# Patient Record
Sex: Female | Born: 1980 | Race: Black or African American | Hispanic: No | Marital: Married | Smoking: Never smoker
Health system: Southern US, Community
[De-identification: ages and names within clinical notes are randomized; demographics above are authoritative.]

## PROBLEM LIST (undated history)

## (undated) ENCOUNTER — Inpatient Hospital Stay (HOSPITAL_COMMUNITY): Payer: Self-pay

## (undated) DIAGNOSIS — F439 Reaction to severe stress, unspecified: Secondary | ICD-10-CM

## (undated) DIAGNOSIS — M549 Dorsalgia, unspecified: Secondary | ICD-10-CM

## (undated) DIAGNOSIS — R5383 Other fatigue: Secondary | ICD-10-CM

## (undated) DIAGNOSIS — M25569 Pain in unspecified knee: Secondary | ICD-10-CM

## (undated) DIAGNOSIS — M2550 Pain in unspecified joint: Secondary | ICD-10-CM

## (undated) DIAGNOSIS — M109 Gout, unspecified: Secondary | ICD-10-CM

## (undated) DIAGNOSIS — R450 Nervousness: Secondary | ICD-10-CM

## (undated) DIAGNOSIS — E785 Hyperlipidemia, unspecified: Secondary | ICD-10-CM

## (undated) DIAGNOSIS — R12 Heartburn: Secondary | ICD-10-CM

## (undated) DIAGNOSIS — E119 Type 2 diabetes mellitus without complications: Secondary | ICD-10-CM

## (undated) DIAGNOSIS — R198 Other specified symptoms and signs involving the digestive system and abdomen: Secondary | ICD-10-CM

## (undated) DIAGNOSIS — R51 Headache: Secondary | ICD-10-CM

## (undated) DIAGNOSIS — I2510 Atherosclerotic heart disease of native coronary artery without angina pectoris: Secondary | ICD-10-CM

## (undated) DIAGNOSIS — J301 Allergic rhinitis due to pollen: Secondary | ICD-10-CM

## (undated) DIAGNOSIS — Z8619 Personal history of other infectious and parasitic diseases: Secondary | ICD-10-CM

## (undated) DIAGNOSIS — K219 Gastro-esophageal reflux disease without esophagitis: Secondary | ICD-10-CM

## (undated) DIAGNOSIS — R0602 Shortness of breath: Secondary | ICD-10-CM

## (undated) DIAGNOSIS — R002 Palpitations: Secondary | ICD-10-CM

## (undated) DIAGNOSIS — R519 Headache, unspecified: Secondary | ICD-10-CM

## (undated) DIAGNOSIS — F419 Anxiety disorder, unspecified: Secondary | ICD-10-CM

## (undated) DIAGNOSIS — I252 Old myocardial infarction: Secondary | ICD-10-CM

## (undated) DIAGNOSIS — R1310 Dysphagia, unspecified: Secondary | ICD-10-CM

## (undated) DIAGNOSIS — I10 Essential (primary) hypertension: Secondary | ICD-10-CM

## (undated) DIAGNOSIS — R079 Chest pain, unspecified: Secondary | ICD-10-CM

## (undated) HISTORY — DX: Headache, unspecified: R51.9

## (undated) HISTORY — DX: Pain in unspecified joint: M25.50

## (undated) HISTORY — DX: Allergic rhinitis due to pollen: J30.1

## (undated) HISTORY — DX: Shortness of breath: R06.02

## (undated) HISTORY — DX: Reaction to severe stress, unspecified: F43.9

## (undated) HISTORY — DX: Nervousness: R45.0

## (undated) HISTORY — DX: Gastro-esophageal reflux disease without esophagitis: K21.9

## (undated) HISTORY — DX: Other fatigue: R53.83

## (undated) HISTORY — DX: Atherosclerotic heart disease of native coronary artery without angina pectoris: I25.10

## (undated) HISTORY — DX: Type 2 diabetes mellitus without complications: E11.9

## (undated) HISTORY — DX: Hyperlipidemia, unspecified: E78.5

## (undated) HISTORY — DX: Palpitations: R00.2

## (undated) HISTORY — DX: Pain in unspecified knee: M25.569

## (undated) HISTORY — DX: Heartburn: R12

## (undated) HISTORY — DX: Essential (primary) hypertension: I10

## (undated) HISTORY — DX: Anxiety disorder, unspecified: F41.9

## (undated) HISTORY — DX: Headache: R51

## (undated) HISTORY — DX: Dysphagia, unspecified: R13.10

## (undated) HISTORY — DX: Dorsalgia, unspecified: M54.9

## (undated) HISTORY — DX: Gout, unspecified: M10.9

## (undated) HISTORY — DX: Old myocardial infarction: I25.2

## (undated) HISTORY — DX: Chest pain, unspecified: R07.9

## (undated) HISTORY — DX: Other specified symptoms and signs involving the digestive system and abdomen: R19.8

---
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
## 1999-11-18 ENCOUNTER — Encounter: Admission: RE | Admit: 1999-11-18 | Discharge: 2000-02-16 | Payer: Self-pay | Admitting: Internal Medicine

## 2000-03-30 ENCOUNTER — Other Ambulatory Visit: Admission: RE | Admit: 2000-03-30 | Discharge: 2000-03-30 | Payer: Self-pay | Admitting: Gynecology

## 2000-12-03 ENCOUNTER — Emergency Department (HOSPITAL_COMMUNITY): Admission: EM | Admit: 2000-12-03 | Discharge: 2000-12-03 | Payer: Self-pay | Admitting: Emergency Medicine

## 2001-04-03 ENCOUNTER — Other Ambulatory Visit: Admission: RE | Admit: 2001-04-03 | Discharge: 2001-04-03 | Payer: Self-pay | Admitting: Gynecology

## 2002-04-05 ENCOUNTER — Other Ambulatory Visit: Admission: RE | Admit: 2002-04-05 | Discharge: 2002-04-05 | Payer: Self-pay | Admitting: Gynecology

## 2003-09-18 ENCOUNTER — Encounter: Admission: RE | Admit: 2003-09-18 | Discharge: 2003-12-17 | Payer: Self-pay | Admitting: Endocrinology

## 2004-03-05 ENCOUNTER — Other Ambulatory Visit: Admission: RE | Admit: 2004-03-05 | Discharge: 2004-03-05 | Payer: Self-pay | Admitting: Gynecology

## 2004-06-22 ENCOUNTER — Ambulatory Visit (HOSPITAL_COMMUNITY): Admission: RE | Admit: 2004-06-22 | Discharge: 2004-06-22 | Payer: Self-pay | Admitting: Surgery

## 2004-06-29 ENCOUNTER — Ambulatory Visit (HOSPITAL_COMMUNITY): Admission: RE | Admit: 2004-06-29 | Discharge: 2004-06-29 | Payer: Self-pay | Admitting: Surgery

## 2004-08-03 ENCOUNTER — Encounter: Admission: RE | Admit: 2004-08-03 | Discharge: 2004-11-01 | Payer: Self-pay | Admitting: Endocrinology

## 2004-11-13 ENCOUNTER — Encounter: Admission: RE | Admit: 2004-11-13 | Discharge: 2005-02-11 | Payer: Self-pay | Admitting: Surgery

## 2005-12-13 ENCOUNTER — Other Ambulatory Visit: Admission: RE | Admit: 2005-12-13 | Discharge: 2005-12-13 | Payer: Self-pay | Admitting: Gynecology

## 2007-03-15 ENCOUNTER — Other Ambulatory Visit: Admission: RE | Admit: 2007-03-15 | Discharge: 2007-03-15 | Payer: Self-pay | Admitting: Gynecology

## 2007-07-31 ENCOUNTER — Emergency Department (HOSPITAL_COMMUNITY): Admission: EM | Admit: 2007-07-31 | Discharge: 2007-07-31 | Payer: Self-pay | Admitting: Emergency Medicine

## 2008-03-11 ENCOUNTER — Encounter: Admission: RE | Admit: 2008-03-11 | Discharge: 2008-03-11 | Payer: Self-pay | Admitting: Surgery

## 2008-03-12 ENCOUNTER — Ambulatory Visit (HOSPITAL_COMMUNITY): Admission: RE | Admit: 2008-03-12 | Discharge: 2008-03-12 | Payer: Self-pay | Admitting: Surgery

## 2008-08-20 ENCOUNTER — Encounter: Admission: RE | Admit: 2008-08-20 | Discharge: 2008-11-18 | Payer: Self-pay | Admitting: Surgery

## 2008-09-02 ENCOUNTER — Ambulatory Visit (HOSPITAL_COMMUNITY): Admission: RE | Admit: 2008-09-02 | Discharge: 2008-09-03 | Payer: Self-pay | Admitting: Surgery

## 2008-09-02 HISTORY — PX: LAPAROSCOPIC GASTRIC BANDING: SHX1100

## 2008-12-23 ENCOUNTER — Encounter: Admission: RE | Admit: 2008-12-23 | Discharge: 2008-12-23 | Payer: Self-pay | Admitting: Surgery

## 2009-03-25 ENCOUNTER — Encounter: Admission: RE | Admit: 2009-03-25 | Discharge: 2009-03-25 | Payer: Self-pay | Admitting: Surgery

## 2009-09-02 ENCOUNTER — Encounter: Admission: RE | Admit: 2009-09-02 | Discharge: 2009-09-17 | Payer: Self-pay | Admitting: Surgery

## 2011-02-02 NOTE — Op Note (Signed)
[name], [name]             ACCOUNT NO.:  [id number]   MEDICAL RECORD NO.:  [id number]          PATIENT TYPE:  OIB   LOCATION:  1534                         FACILITY:  [facility]   PHYSICIAN:  Sandria Bales. Ezzard Standing, M.D.  DATE OF BIRTH:  [birth year]   DATE OF PROCEDURE:  09/02/2008  DATE OF DISCHARGE:                               OPERATIVE REPORT   Date of Surgery ?   PREOPERATIVE DIAGNOSIS:  Morbid obesity weight 313, BMI 53.5.   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   PROCEDURE:  Enterolysis of adhesions (one hour spent), lap band AP large  with subcutaneous port with mesh backing   SURGEON:  Sandria Bales. Ezzard Standing, M.D.   FIRST ASSISTANT:  Thornton Park. Daphine Deutscher, MD   ANESTHESIA:  General endotracheal.   ESTIMATED BLOOD LOSS:  Minimal.   PROCEDURE:  This patient is a 30 year old black female who sees Dr. Hinda Lenis as her primary doctor, Dr. Dorisann Frames from endocrine  standpoint and she has had been interested in a lap band.  She has been  through our preop bariatric program.  I have discussed with her the  indications, potential complications of band surgery.  Potential  complications include, but are not limited to bleeding, infection, bowel  injury, slippage erosion of the band and long-term nutritional  consequences.   One point is that she had gastric surgery as an infant about 1 year of  age for a foreign body she swallowed, it is actually a little bit  unclear of how that will affect.  She understands that adds an increased  risk of the operation.   OPERATIVE NOTE:  The patient placed in a supine position, given a  general endotracheal anesthetic.  Her abdomen was prepped with  Technicare generic then sterilely draped.  A time-out was held  identifying the patient and the procedure.  She was given Ancef prior to  the procedure had PAS stockings in place.   I entered the abdomen  through a left upper quadrant 11-mm OptiVu trocar.  She had adhesions underneath her midline scar.  I placed a 5 mm in the  left lower quadrant and helped take these adhesions down mainly with  sharp dissection, but I did use a Harmonic Bovie a little bit.   I then got up to her liver and now put my more traditional trocars in  place.  I placed a 5 mm left lateral trocar, a 11 mm left paramedian  trocar and 11 mm right paramedian trocar, right subcostal 15 mm trocar  and placed the Nathanson retractor through a 5 mm subxiphoid trocar.   I spent a total of about 1 hour taking down adhesions, beside the  adhesions to the anterior wall of the omentum, up under the left lobe of  the liver, her stomach was fairly intimate and I teased through this.  I  tried to avoid using any kind of energy and used primarily either blunt  dissection or scissors.  I got up to the esophageal hiatus.  I then identified the left crus and the right crus of the diaphragm and  thought I had a reasonable anatomy demonstrated.   Anesthesia passed the sizer down into the stomach and blew the balloon  up.  The balloon did seem to go through the hiatus, so she has a  generous hiatus but I was hesitant to dissect out the hiatus; first her  upper GI showed no evidence of hiatal hernia.  Secondly, she has  occasional reflux symptoms not was chronically on medicines.   Therefore, I went on with the band procedure.  Because of her size and  the proximal gastric fat, I decided to put an AP large band.  I  identified the angle of HIS along the left crus.  I then went along the  right crus, used a finger dissector to pass this behind the proximal  stomach and I passed the band around the proximal stomach.   With the balloon reinserted into the stomach, I then cinched the band  down over this balloon, removed the balloon and I used three 2-0  Ethibond sutures to buttress or imbricate the stomach over the left  lateral  aspect of the band.   The band is seen to lay in the proper position.  There is no bleeding  from the suture sites and the band did rotate.   I then irrigated the abdomen with about a liter and a half of saline.  I  reinspected this liver which had bled some but the resection was dry by  the end of the procedure.  I aspirated out this fluid.  I then brought  out the tubing through the right paramedian incision.  I removed all of  the trocars under direct visualization, as well as the Management consultant.   Because of her size, I  did a subcuticular pocket.  I attached the  silastic tubing to the  reservoir device.  I placed a reservoir backing  of the Bard polypropylene suture and placed this back with four Prolene  sutures.  I created a pocket lateral to a paramedian incision placed  this in the pocket, sewed this over with 3-0 Vicryl suture.  I closed  the skin with a 5-0 Vicryl suture at each site.  I painted the wound  with tincture of Benzoin steri-stripped and sterilely dressed.   The patient tolerated the procedure well, was transported to the  recovery room good condition.  Sponge and needle count were correct at  the end of the case.      Sandria Bales. Ezzard Standing, M.D.  Electronically Signed     DHN/MEDQ  D:  09/02/2008  T:  09/03/2008  Job:  [id number]   cc:   Dorisann Frames, M.D.  Fax: [phone]   Elana Alm. Nicholos Johns, M.D.  Fax: [phone]   Gretta Cool, M.D.  Fax: [phone]

## 2011-06-24 LAB — GLUCOSE, CAPILLARY
Glucose-Capillary: 110 mg/dL — ABNORMAL HIGH (ref 70–99)
Glucose-Capillary: 128 mg/dL — ABNORMAL HIGH (ref 70–99)
Glucose-Capillary: 175 mg/dL — ABNORMAL HIGH (ref 70–99)
Glucose-Capillary: 193 mg/dL — ABNORMAL HIGH (ref 70–99)
Glucose-Capillary: 202 mg/dL — ABNORMAL HIGH (ref 70–99)
Glucose-Capillary: 222 mg/dL — ABNORMAL HIGH (ref 70–99)
Glucose-Capillary: 266 mg/dL — ABNORMAL HIGH (ref 70–99)

## 2011-06-24 LAB — CBC
HCT: 42.7 % (ref 36.0–46.0)
HCT: 50.6 % — ABNORMAL HIGH (ref 36.0–46.0)
Hemoglobin: 14.2 g/dL (ref 12.0–15.0)
Hemoglobin: 16.6 g/dL — ABNORMAL HIGH (ref 12.0–15.0)
MCHC: 32.8 g/dL (ref 30.0–36.0)
MCHC: 33.3 g/dL (ref 30.0–36.0)
MCV: 78.2 fL (ref 78.0–100.0)
MCV: 78.8 fL (ref 78.0–100.0)
Platelets: 226 10*3/uL (ref 150–400)
Platelets: ADEQUATE 10*3/uL (ref 150–400)
RBC: 5.43 MIL/uL — ABNORMAL HIGH (ref 3.87–5.11)
RBC: 6.48 MIL/uL — ABNORMAL HIGH (ref 3.87–5.11)
RDW: 14.6 % (ref 11.5–15.5)
RDW: 15 % (ref 11.5–15.5)
WBC: 7.1 10*3/uL (ref 4.0–10.5)
WBC: 8.8 10*3/uL (ref 4.0–10.5)

## 2011-06-24 LAB — DIFFERENTIAL
Basophils Absolute: 0 10*3/uL (ref 0.0–0.1)
Basophils Absolute: 0.1 10*3/uL (ref 0.0–0.1)
Basophils Relative: 0 % (ref 0–1)
Basophils Relative: 1 % (ref 0–1)
Eosinophils Absolute: 0 10*3/uL (ref 0.0–0.7)
Eosinophils Absolute: 0.1 10*3/uL (ref 0.0–0.7)
Eosinophils Relative: 0 % (ref 0–5)
Eosinophils Relative: 2 % (ref 0–5)
Lymphocytes Relative: 10 % — ABNORMAL LOW (ref 12–46)
Lymphocytes Relative: 32 % (ref 12–46)
Lymphs Abs: 0.9 10*3/uL (ref 0.7–4.0)
Lymphs Abs: 2.3 10*3/uL (ref 0.7–4.0)
Monocytes Absolute: 0.3 10*3/uL (ref 0.1–1.0)
Monocytes Absolute: 0.4 10*3/uL (ref 0.1–1.0)
Monocytes Relative: 4 % (ref 3–12)
Monocytes Relative: 5 % (ref 3–12)
Neutro Abs: 4.3 10*3/uL (ref 1.7–7.7)
Neutro Abs: 7.5 10*3/uL (ref 1.7–7.7)
Neutrophils Relative %: 61 % (ref 43–77)
Neutrophils Relative %: 85 % — ABNORMAL HIGH (ref 43–77)

## 2011-06-24 LAB — PREGNANCY, URINE: Preg Test, Ur: NEGATIVE

## 2011-06-29 LAB — URIC ACID: Uric Acid, Serum: 7.8 — ABNORMAL HIGH

## 2012-04-05 ENCOUNTER — Encounter (INDEPENDENT_AMBULATORY_CARE_PROVIDER_SITE_OTHER): Payer: Self-pay | Admitting: General Surgery

## 2012-04-06 ENCOUNTER — Ambulatory Visit (INDEPENDENT_AMBULATORY_CARE_PROVIDER_SITE_OTHER): Payer: BC Managed Care – PPO | Admitting: Physician Assistant

## 2012-04-06 ENCOUNTER — Encounter (INDEPENDENT_AMBULATORY_CARE_PROVIDER_SITE_OTHER): Payer: Self-pay

## 2012-04-06 VITALS — BP 112/84 | Ht 64.0 in | Wt 227.2 lb

## 2012-04-06 DIAGNOSIS — Z4651 Encounter for fitting and adjustment of gastric lap band: Secondary | ICD-10-CM

## 2012-04-06 NOTE — Progress Notes (Signed)
  HISTORY: [name] is a 31 y.o.female who received an AP-Large lap-band in December 2009 by Dr. Ezzard Standing. She comes in having last been seen in October 2010 and has lost an additional 11 lbs. She is noticing an increase in her hunger and portion sizes and would like an adjustment today. She denies any regurgitation or reflux.  VITAL SIGNS: Filed Vitals:   04/06/12 1122  BP: 112/84    PHYSICAL EXAM: Physical exam reveals a very well-appearing 31 y.o.female in no apparent distress Neurologic: Awake, alert, oriented Psych: Bright affect, conversant Respiratory: Breathing even and unlabored. No stridor or wheezing Abdomen: Soft, nontender, nondistended to palpation. Incisions well-healed. No incisional hernias. Port easily palpated. Extremities: Atraumatic, good range of motion.  ASSESMENT: 31 y.o.  female  s/p AP-Large lap-band.   PLAN: The patient's port was accessed with a 20G Huber needle without difficulty. Clear fluid was aspirated and 0.5 mL saline was added to the port to give a total predicted volume of 9 mL. The patient was able to swallow water without difficulty following the procedure and was instructed to take clear liquids for the next 24-48 hours and advance slowly as tolerated.

## 2012-04-06 NOTE — Patient Instructions (Signed)
Take clear liquids tonight. Thin protein shakes are ok to start tomorrow morning. Slowly advance your diet thereafter. Call us if you have persistent vomiting or regurgitation, night cough or reflux symptoms. Return as scheduled or sooner if you notice no changes in hunger/portion sizes.  

## 2016-06-23 ENCOUNTER — Encounter (HOSPITAL_COMMUNITY): Payer: Self-pay

## 2017-04-07 ENCOUNTER — Encounter (HOSPITAL_COMMUNITY): Payer: Self-pay

## 2017-07-24 ENCOUNTER — Encounter (HOSPITAL_COMMUNITY)
Admission: EM | Disposition: A | Discharge status: Home / Self Care | Payer: Self-pay | Source: Home / Self Care | Attending: Interventional Cardiology

## 2017-07-24 ENCOUNTER — Emergency Department (HOSPITAL_COMMUNITY): Payer: BC Managed Care – PPO

## 2017-07-24 ENCOUNTER — Encounter (HOSPITAL_COMMUNITY): Payer: Self-pay | Admitting: Emergency Medicine

## 2017-07-24 ENCOUNTER — Inpatient Hospital Stay (HOSPITAL_COMMUNITY)
Admission: EM | Admit: 2017-07-24 | Discharge: 2017-08-09 | DRG: 231 | Disposition: A | Discharge status: Home / Self Care | Payer: BC Managed Care – PPO | Attending: Cardiothoracic Surgery | Admitting: Cardiothoracic Surgery

## 2017-07-24 ENCOUNTER — Other Ambulatory Visit: Payer: Self-pay

## 2017-07-24 DIAGNOSIS — I200 Unstable angina: Secondary | ICD-10-CM | POA: Diagnosis not present

## 2017-07-24 DIAGNOSIS — I25110 Atherosclerotic heart disease of native coronary artery with unstable angina pectoris: Secondary | ICD-10-CM | POA: Diagnosis not present

## 2017-07-24 DIAGNOSIS — I2111 ST elevation (STEMI) myocardial infarction involving right coronary artery: Secondary | ICD-10-CM | POA: Diagnosis not present

## 2017-07-24 DIAGNOSIS — E785 Hyperlipidemia, unspecified: Secondary | ICD-10-CM | POA: Diagnosis present

## 2017-07-24 DIAGNOSIS — I2510 Atherosclerotic heart disease of native coronary artery without angina pectoris: Secondary | ICD-10-CM | POA: Diagnosis present

## 2017-07-24 DIAGNOSIS — E1169 Type 2 diabetes mellitus with other specified complication: Secondary | ICD-10-CM | POA: Diagnosis present

## 2017-07-24 DIAGNOSIS — I252 Old myocardial infarction: Secondary | ICD-10-CM | POA: Diagnosis present

## 2017-07-24 DIAGNOSIS — Z79899 Other long term (current) drug therapy: Secondary | ICD-10-CM

## 2017-07-24 DIAGNOSIS — J9811 Atelectasis: Secondary | ICD-10-CM | POA: Diagnosis not present

## 2017-07-24 DIAGNOSIS — I5031 Acute diastolic (congestive) heart failure: Secondary | ICD-10-CM | POA: Diagnosis present

## 2017-07-24 DIAGNOSIS — I25709 Atherosclerosis of coronary artery bypass graft(s), unspecified, with unspecified angina pectoris: Secondary | ICD-10-CM | POA: Diagnosis present

## 2017-07-24 DIAGNOSIS — E119 Type 2 diabetes mellitus without complications: Secondary | ICD-10-CM | POA: Diagnosis present

## 2017-07-24 DIAGNOSIS — I509 Heart failure, unspecified: Secondary | ICD-10-CM

## 2017-07-24 DIAGNOSIS — Z833 Family history of diabetes mellitus: Secondary | ICD-10-CM | POA: Diagnosis not present

## 2017-07-24 DIAGNOSIS — J90 Pleural effusion, not elsewhere classified: Secondary | ICD-10-CM

## 2017-07-24 DIAGNOSIS — I2119 ST elevation (STEMI) myocardial infarction involving other coronary artery of inferior wall: Principal | ICD-10-CM | POA: Diagnosis present

## 2017-07-24 DIAGNOSIS — Z8249 Family history of ischemic heart disease and other diseases of the circulatory system: Secondary | ICD-10-CM | POA: Diagnosis not present

## 2017-07-24 DIAGNOSIS — Z8042 Family history of malignant neoplasm of prostate: Secondary | ICD-10-CM

## 2017-07-24 DIAGNOSIS — E669 Obesity, unspecified: Secondary | ICD-10-CM | POA: Diagnosis not present

## 2017-07-24 DIAGNOSIS — Z01810 Encounter for preprocedural cardiovascular examination: Secondary | ICD-10-CM | POA: Diagnosis not present

## 2017-07-24 DIAGNOSIS — D62 Acute posthemorrhagic anemia: Secondary | ICD-10-CM | POA: Diagnosis not present

## 2017-07-24 DIAGNOSIS — I8393 Asymptomatic varicose veins of bilateral lower extremities: Secondary | ICD-10-CM | POA: Diagnosis present

## 2017-07-24 DIAGNOSIS — Z6841 Body Mass Index (BMI) 40.0 and over, adult: Secondary | ICD-10-CM

## 2017-07-24 DIAGNOSIS — Z801 Family history of malignant neoplasm of trachea, bronchus and lung: Secondary | ICD-10-CM

## 2017-07-24 DIAGNOSIS — Z955 Presence of coronary angioplasty implant and graft: Secondary | ICD-10-CM

## 2017-07-24 DIAGNOSIS — I2129 ST elevation (STEMI) myocardial infarction involving other sites: Secondary | ICD-10-CM | POA: Diagnosis present

## 2017-07-24 DIAGNOSIS — Z951 Presence of aortocoronary bypass graft: Secondary | ICD-10-CM

## 2017-07-24 DIAGNOSIS — Z9689 Presence of other specified functional implants: Secondary | ICD-10-CM

## 2017-07-24 DIAGNOSIS — J939 Pneumothorax, unspecified: Secondary | ICD-10-CM

## 2017-07-24 DIAGNOSIS — Z9884 Bariatric surgery status: Secondary | ICD-10-CM

## 2017-07-24 DIAGNOSIS — M109 Gout, unspecified: Secondary | ICD-10-CM | POA: Diagnosis present

## 2017-07-24 DIAGNOSIS — I110 Hypertensive heart disease with heart failure: Secondary | ICD-10-CM | POA: Diagnosis present

## 2017-07-24 HISTORY — PX: CORONARY/GRAFT ACUTE MI REVASCULARIZATION: CATH118305

## 2017-07-24 HISTORY — PX: LEFT HEART CATH AND CORONARY ANGIOGRAPHY: CATH118249

## 2017-07-24 LAB — I-STAT CHEM 8, ED
BUN: 13 mg/dL (ref 6–20)
Calcium, Ion: 1.11 mmol/L — ABNORMAL LOW (ref 1.15–1.40)
Chloride: 103 mmol/L (ref 101–111)
Creatinine, Ser: 0.8 mg/dL (ref 0.44–1.00)
Glucose, Bld: 250 mg/dL — ABNORMAL HIGH (ref 65–99)
HCT: 41 % (ref 36.0–46.0)
Hemoglobin: 13.9 g/dL (ref 12.0–15.0)
Potassium: 4.8 mmol/L (ref 3.5–5.1)
Sodium: 139 mmol/L (ref 135–145)
TCO2: 24 mmol/L (ref 22–32)

## 2017-07-24 LAB — CREATININE, SERUM
Creatinine, Ser: 0.85 mg/dL (ref 0.44–1.00)
GFR calc Af Amer: >60 mL/min (ref >60)
GFR calc non Af Amer: >60 mL/min (ref >60)

## 2017-07-24 LAB — COMPREHENSIVE METABOLIC PANEL
ALT: 18 U/L (ref 14–54)
AST: 26 U/L (ref 15–41)
Albumin: 3.2 g/dL — ABNORMAL LOW (ref 3.5–5.0)
Alkaline Phosphatase: 65 U/L (ref 38–126)
Anion gap: 9 (ref 5–15)
BUN: 13 mg/dL (ref 6–20)
CO2: 23 mmol/L (ref 22–32)
Calcium: 8.5 mg/dL — ABNORMAL LOW (ref 8.9–10.3)
Chloride: 103 mmol/L (ref 101–111)
Creatinine, Ser: 0.86 mg/dL (ref 0.44–1.00)
GFR calc Af Amer: >60 mL/min (ref >60)
GFR calc non Af Amer: >60 mL/min (ref >60)
Glucose, Bld: 256 mg/dL — ABNORMAL HIGH (ref 65–99)
Potassium: 4.8 mmol/L (ref 3.5–5.1)
Sodium: 135 mmol/L (ref 135–145)
Total Bilirubin: 0.6 mg/dL (ref 0.3–1.2)
Total Protein: 6.7 g/dL (ref 6.5–8.1)

## 2017-07-24 LAB — CBC WITH DIFFERENTIAL/PLATELET
Basophils Absolute: 0 10*3/uL (ref 0.0–0.1)
Basophils Relative: 0 %
Eosinophils Absolute: 0.1 10*3/uL (ref 0.0–0.7)
Eosinophils Relative: 1 %
HCT: 39.4 % (ref 36.0–46.0)
Hemoglobin: 13.5 g/dL (ref 12.0–15.0)
Lymphocytes Relative: 19 %
Lymphs Abs: 1.8 10*3/uL (ref 0.7–4.0)
MCH: 28 pg (ref 26.0–34.0)
MCHC: 34.3 g/dL (ref 30.0–36.0)
MCV: 81.6 fL (ref 78.0–100.0)
Monocytes Absolute: 0.7 10*3/uL (ref 0.1–1.0)
Monocytes Relative: 7 %
Neutro Abs: 7.1 10*3/uL (ref 1.7–7.7)
Neutrophils Relative %: 73 %
Platelets: 209 10*3/uL (ref 150–400)
RBC: 4.83 MIL/uL (ref 3.87–5.11)
RDW: 13.5 % (ref 11.5–15.5)
WBC: 9.8 10*3/uL (ref 4.0–10.5)

## 2017-07-24 LAB — HEMOGLOBIN A1C
Hgb A1c MFr Bld: 7.4 % — ABNORMAL HIGH (ref 4.8–5.6)
Mean Plasma Glucose: 165.68 mg/dL

## 2017-07-24 LAB — I-STAT TROPONIN, ED: Troponin i, poc: 0.03 ng/mL (ref 0.00–0.08)

## 2017-07-24 LAB — TROPONIN I
Troponin I: >65 ng/mL (ref <0.03)
Troponin I: 61.51 ng/mL (ref <0.03)

## 2017-07-24 LAB — CBC
HCT: 41.9 % (ref 36.0–46.0)
Hemoglobin: 13.8 g/dL (ref 12.0–15.0)
MCH: 27 pg (ref 26.0–34.0)
MCHC: 32.9 g/dL (ref 30.0–36.0)
MCV: 81.8 fL (ref 78.0–100.0)
Platelets: 217 10*3/uL (ref 150–400)
RBC: 5.12 MIL/uL — ABNORMAL HIGH (ref 3.87–5.11)
RDW: 13.4 % (ref 11.5–15.5)
WBC: 9.8 10*3/uL (ref 4.0–10.5)

## 2017-07-24 LAB — GLUCOSE, CAPILLARY
Glucose-Capillary: 210 mg/dL — ABNORMAL HIGH (ref 65–99)
Glucose-Capillary: 151 mg/dL — ABNORMAL HIGH (ref 65–99)
Glucose-Capillary: 158 mg/dL — ABNORMAL HIGH (ref 65–99)

## 2017-07-24 LAB — I-STAT BETA HCG BLOOD, ED (MC, WL, AP ONLY): I-stat hCG, quantitative: <5 m[IU]/mL (ref <5)

## 2017-07-24 LAB — HEPARIN LEVEL (UNFRACTIONATED): Heparin Unfractionated: <0.1 IU/mL — ABNORMAL LOW (ref 0.30–0.70)

## 2017-07-24 LAB — APTT: aPTT: 24 seconds (ref 24–36)

## 2017-07-24 LAB — POCT ACTIVATED CLOTTING TIME
Activated Clotting Time: 175 seconds
Activated Clotting Time: 180 seconds
Activated Clotting Time: 202 seconds
Activated Clotting Time: 208 seconds
Activated Clotting Time: 252 seconds

## 2017-07-24 LAB — PROTIME-INR
INR: 0.95
Prothrombin Time: 12.6 seconds (ref 11.4–15.2)

## 2017-07-24 SURGERY — CORONARY/GRAFT ACUTE MI REVASCULARIZATION
Anesthesia: LOCAL

## 2017-07-24 MED ORDER — NITROGLYCERIN 1 MG/10 ML FOR IR/CATH LAB
INTRA_ARTERIAL | Status: DC | PRN
  Administered 2017-07-24: 200 ug via INTRACORONARY

## 2017-07-24 MED ORDER — HEPARIN (PORCINE) IN NACL 2-0.9 UNIT/ML-% IJ SOLN
INTRAMUSCULAR | Status: AC
  Filled 2017-07-24: qty 1000

## 2017-07-24 MED ORDER — VERAPAMIL HCL 2.5 MG/ML IV SOLN
INTRAVENOUS | Status: DC | PRN
  Administered 2017-07-24: 10 mL via INTRA_ARTERIAL

## 2017-07-24 MED ORDER — TIROFIBAN HCL IN NACL 5-0.9 MG/100ML-% IV SOLN
INTRAVENOUS | Status: AC | PRN
  Administered 2017-07-24: 0.15 ug/kg/min via INTRAVENOUS

## 2017-07-24 MED ORDER — TICAGRELOR 90 MG PO TABS
ORAL_TABLET | ORAL | Status: AC
  Filled 2017-07-24: qty 2

## 2017-07-24 MED ORDER — SODIUM CHLORIDE 0.9% FLUSH
3.0000 mL | Freq: Two times a day (BID) | INTRAVENOUS | Status: DC
  Administered 2017-07-24: 3 mL via INTRAVENOUS
  Administered 2017-07-25: 10 mL via INTRAVENOUS
  Administered 2017-07-25 – 2017-08-01 (×11): 3 mL via INTRAVENOUS

## 2017-07-24 MED ORDER — ALPRAZOLAM 0.5 MG PO TABS
1.0000 mg | ORAL_TABLET | Freq: Two times a day (BID) | ORAL | Status: DC | PRN
  Administered 2017-07-24 – 2017-07-29 (×2): 1 mg via ORAL
  Filled 2017-07-24 (×3): qty 4

## 2017-07-24 MED ORDER — ONDANSETRON HCL 4 MG/2ML IJ SOLN
4.0000 mg | Freq: Four times a day (QID) | INTRAMUSCULAR | Status: DC | PRN
  Administered 2017-07-24: 4 mg via INTRAVENOUS
  Filled 2017-07-24: qty 2

## 2017-07-24 MED ORDER — SODIUM CHLORIDE 0.9 % IV BOLUS (SEPSIS)
1000.0000 mL | Freq: Once | INTRAVENOUS | Status: AC
  Administered 2017-07-24: 1000 mL via INTRAVENOUS

## 2017-07-24 MED ORDER — SODIUM CHLORIDE 0.9 % IV SOLN: 250.0000 mL | INTRAVENOUS | Status: DC | PRN

## 2017-07-24 MED ORDER — HEPARIN BOLUS VIA INFUSION
4000.0000 [IU] | Freq: Once | INTRAVENOUS | Status: AC
  Administered 2017-07-24: 4000 [IU] via INTRAVENOUS
  Filled 2017-07-24: qty 4000

## 2017-07-24 MED ORDER — TIROFIBAN HCL IN NACL 5-0.9 MG/100ML-% IV SOLN
INTRAVENOUS | Status: AC
  Filled 2017-07-24: qty 100

## 2017-07-24 MED ORDER — HYDRALAZINE HCL 20 MG/ML IJ SOLN: 5.0000 mg | INTRAMUSCULAR | Status: AC | PRN

## 2017-07-24 MED ORDER — INSULIN ASPART 100 UNIT/ML ~~LOC~~ SOLN
3.0000 [IU] | Freq: Three times a day (TID) | SUBCUTANEOUS | Status: DC
  Administered 2017-07-24 – 2017-08-01 (×25): 3 [IU] via SUBCUTANEOUS

## 2017-07-24 MED ORDER — NITROGLYCERIN 0.4 MG SL SUBL
SUBLINGUAL_TABLET | SUBLINGUAL | Status: AC
  Administered 2017-07-24: 05:00:00
  Filled 2017-07-24: qty 1

## 2017-07-24 MED ORDER — LIDOCAINE HCL (PF) 1 % IJ SOLN
INTRAMUSCULAR | Status: AC
  Filled 2017-07-24: qty 30

## 2017-07-24 MED ORDER — MIDAZOLAM HCL 2 MG/2ML IJ SOLN
INTRAMUSCULAR | Status: AC
  Filled 2017-07-24: qty 2

## 2017-07-24 MED ORDER — SODIUM CHLORIDE 0.9% FLUSH
3.0000 mL | INTRAVENOUS | Status: DC | PRN
  Administered 2017-07-31: 3 mL via INTRAVENOUS
  Filled 2017-07-24: qty 3

## 2017-07-24 MED ORDER — HEPARIN (PORCINE) IN NACL 2-0.9 UNIT/ML-% IJ SOLN
INTRAMUSCULAR | Status: AC | PRN
  Administered 2017-07-24: 1000 mL

## 2017-07-24 MED ORDER — HEPARIN SODIUM (PORCINE) 1000 UNIT/ML IJ SOLN
INTRAMUSCULAR | Status: AC
  Filled 2017-07-24: qty 1

## 2017-07-24 MED ORDER — NITROGLYCERIN 1 MG/10 ML FOR IR/CATH LAB
INTRA_ARTERIAL | Status: AC
  Filled 2017-07-24: qty 10

## 2017-07-24 MED ORDER — CARVEDILOL 3.125 MG PO TABS
3.1250 mg | ORAL_TABLET | Freq: Two times a day (BID) | ORAL | Status: DC
  Administered 2017-07-24 – 2017-07-25 (×4): 3.125 mg via ORAL
  Filled 2017-07-24 (×4): qty 1

## 2017-07-24 MED ORDER — MIDAZOLAM HCL 2 MG/2ML IJ SOLN
INTRAMUSCULAR | Status: DC | PRN
  Administered 2017-07-24 (×2): 1 mg via INTRAVENOUS

## 2017-07-24 MED ORDER — LIDOCAINE HCL (PF) 1 % IJ SOLN
INTRAMUSCULAR | Status: DC | PRN
  Administered 2017-07-24: 2 mL

## 2017-07-24 MED ORDER — VERAPAMIL HCL 2.5 MG/ML IV SOLN
INTRAVENOUS | Status: AC
  Filled 2017-07-24: qty 2

## 2017-07-24 MED ORDER — HEPARIN (PORCINE) IN NACL 100-0.45 UNIT/ML-% IJ SOLN
1000.0000 [IU]/h | INTRAMUSCULAR | Status: DC
  Administered 2017-07-24: 1000 [IU]/h via INTRAVENOUS
  Filled 2017-07-24: qty 250

## 2017-07-24 MED ORDER — SODIUM CHLORIDE 0.9 % IV SOLN
INTRAVENOUS | Status: DC
  Administered 2017-07-26 – 2017-08-01 (×8): via INTRAVENOUS

## 2017-07-24 MED ORDER — TICAGRELOR 90 MG PO TABS
ORAL_TABLET | ORAL | Status: DC | PRN
  Administered 2017-07-24: 180 mg via ORAL

## 2017-07-24 MED ORDER — TIROFIBAN HCL IN NACL 5-0.9 MG/100ML-% IV SOLN: 0.1500 ug/kg/min | INTRAVENOUS | Status: AC

## 2017-07-24 MED ORDER — ASPIRIN 81 MG PO CHEW
CHEWABLE_TABLET | ORAL | Status: AC
  Administered 2017-07-24: 05:00:00
  Filled 2017-07-24: qty 4

## 2017-07-24 MED ORDER — HEPARIN SODIUM (PORCINE) 1000 UNIT/ML IJ SOLN
INTRAMUSCULAR | Status: DC | PRN
  Administered 2017-07-24 (×2): 3000 [IU] via INTRAVENOUS
  Administered 2017-07-24: 10000 [IU] via INTRAVENOUS
  Administered 2017-07-24 (×2): 5000 [IU] via INTRAVENOUS

## 2017-07-24 MED ORDER — TIROFIBAN (AGGRASTAT) BOLUS VIA INFUSION
INTRAVENOUS | Status: DC | PRN
  Administered 2017-07-24: 2947.5 ug via INTRAVENOUS

## 2017-07-24 MED ORDER — LABETALOL HCL 5 MG/ML IV SOLN: 10.0000 mg | INTRAVENOUS | Status: AC | PRN

## 2017-07-24 MED ORDER — TICAGRELOR 90 MG PO TABS: 90.0000 mg | ORAL_TABLET | Freq: Two times a day (BID) | ORAL | Status: DC

## 2017-07-24 MED ORDER — FENTANYL CITRATE (PF) 100 MCG/2ML IJ SOLN
INTRAMUSCULAR | Status: DC | PRN
  Administered 2017-07-24: 50 ug via INTRAVENOUS
  Administered 2017-07-24: 25 ug via INTRAVENOUS

## 2017-07-24 MED ORDER — ATROPINE SULFATE 1 MG/10ML IJ SOSY
PREFILLED_SYRINGE | INTRAMUSCULAR | Status: DC | PRN
  Administered 2017-07-24: 1 mg via INTRAVENOUS

## 2017-07-24 MED ORDER — ATORVASTATIN CALCIUM 80 MG PO TABS
80.0000 mg | ORAL_TABLET | Freq: Every day | ORAL | Status: DC
  Administered 2017-07-24 – 2017-08-08 (×15): 80 mg via ORAL
  Filled 2017-07-24 (×16): qty 1

## 2017-07-24 MED ORDER — OXYCODONE HCL 5 MG PO TABS
5.0000 mg | ORAL_TABLET | ORAL | Status: DC | PRN
  Administered 2017-07-24: 10 mg via ORAL
  Administered 2017-07-27: 5 mg via ORAL
  Filled 2017-07-24: qty 2
  Filled 2017-07-24: qty 1

## 2017-07-24 MED ORDER — HEPARIN SODIUM (PORCINE) 5000 UNIT/ML IJ SOLN
5000.0000 [IU] | Freq: Three times a day (TID) | INTRAMUSCULAR | Status: DC
  Administered 2017-07-25 – 2017-08-01 (×22): 5000 [IU] via SUBCUTANEOUS
  Filled 2017-07-24 (×23): qty 1

## 2017-07-24 MED ORDER — ASPIRIN 81 MG PO CHEW
81.0000 mg | CHEWABLE_TABLET | Freq: Every day | ORAL | Status: DC
  Administered 2017-07-25 – 2017-08-01 (×8): 81 mg via ORAL
  Filled 2017-07-24 (×8): qty 1

## 2017-07-24 MED ORDER — IOPAMIDOL (ISOVUE-370) INJECTION 76%
INTRAVENOUS | Status: AC
  Filled 2017-07-24: qty 125

## 2017-07-24 MED ORDER — SODIUM CHLORIDE 0.9 % WEIGHT BASED INFUSION: 0.7500 mL/kg/h | INTRAVENOUS | Status: AC

## 2017-07-24 MED ORDER — ATROPINE SULFATE 1 MG/10ML IJ SOSY
PREFILLED_SYRINGE | INTRAMUSCULAR | Status: AC
  Filled 2017-07-24: qty 10

## 2017-07-24 MED ORDER — ACETAMINOPHEN 325 MG PO TABS
650.0000 mg | ORAL_TABLET | ORAL | Status: DC | PRN
  Administered 2017-07-26 – 2017-07-30 (×7): 650 mg via ORAL
  Filled 2017-07-24 (×8): qty 2

## 2017-07-24 MED ORDER — ASPIRIN 81 MG PO CHEW: 324.0000 mg | CHEWABLE_TABLET | Freq: Once | ORAL | Status: DC

## 2017-07-24 MED ORDER — PNEUMOCOCCAL VAC POLYVALENT 25 MCG/0.5ML IJ INJ: 0.5000 mL | INJECTION | INTRAMUSCULAR | Status: DC | PRN

## 2017-07-24 MED ORDER — TIROFIBAN HCL IV 12.5 MG/250 ML
0.1500 ug/kg/min | INTRAVENOUS | Status: DC
  Administered 2017-07-24 – 2017-08-01 (×33): 0.15 ug/kg/min via INTRAVENOUS
  Filled 2017-07-24 (×5): qty 100
  Filled 2017-07-24: qty 250
  Filled 2017-07-24: qty 100
  Filled 2017-07-24: qty 250
  Filled 2017-07-24: qty 100
  Filled 2017-07-24 (×2): qty 250
  Filled 2017-07-24 (×3): qty 100
  Filled 2017-07-24: qty 250
  Filled 2017-07-24 (×2): qty 100
  Filled 2017-07-24: qty 250
  Filled 2017-07-24 (×4): qty 100
  Filled 2017-07-24: qty 250
  Filled 2017-07-24: qty 100
  Filled 2017-07-24: qty 250
  Filled 2017-07-24 (×8): qty 100
  Filled 2017-07-24: qty 250
  Filled 2017-07-24 (×4): qty 100
  Filled 2017-07-24: qty 250
  Filled 2017-07-24 (×3): qty 100

## 2017-07-24 MED ORDER — INSULIN ASPART 100 UNIT/ML ~~LOC~~ SOLN
0.0000 [IU] | Freq: Three times a day (TID) | SUBCUTANEOUS | Status: DC
  Administered 2017-07-24 – 2017-07-25 (×2): 3 [IU] via SUBCUTANEOUS
  Administered 2017-07-25: 5 [IU] via SUBCUTANEOUS
  Administered 2017-07-26 – 2017-07-29 (×11): 2 [IU] via SUBCUTANEOUS
  Administered 2017-07-30: 3 [IU] via SUBCUTANEOUS
  Administered 2017-07-30 – 2017-08-01 (×7): 2 [IU] via SUBCUTANEOUS

## 2017-07-24 MED ORDER — ROSUVASTATIN CALCIUM 20 MG PO TABS: 20.0000 mg | ORAL_TABLET | Freq: Every day | ORAL | Status: DC

## 2017-07-24 MED ORDER — ASPIRIN 325 MG PO TABS: 325.0000 mg | ORAL_TABLET | Freq: Every day | ORAL | Status: DC

## 2017-07-24 MED ORDER — IOPAMIDOL (ISOVUE-370) INJECTION 76%
INTRAVENOUS | Status: DC | PRN
  Administered 2017-07-24: 200 mL via INTRA_ARTERIAL

## 2017-07-24 MED ORDER — IOPAMIDOL (ISOVUE-370) INJECTION 76%
INTRAVENOUS | Status: AC
  Filled 2017-07-24: qty 50

## 2017-07-24 MED ORDER — FENTANYL CITRATE (PF) 100 MCG/2ML IJ SOLN
INTRAMUSCULAR | Status: AC
  Filled 2017-07-24: qty 2

## 2017-07-24 SURGICAL SUPPLY — 20 items
BALLN SAPPHIRE 2.5X15 (BALLOONS) ×2
BALLN SAPPHIRE ~~LOC~~ 3.25X15 (BALLOONS) ×1 IMPLANT
BALLOON SAPPHIRE 2.5X15 (BALLOONS) IMPLANT
CATH INFINITI 4FR JL 4.0 (CATHETERS) ×1 IMPLANT
CATH INFINITI 5 FR JL3.5 (CATHETERS) ×1 IMPLANT
CATH INFINITI JR4 5F (CATHETERS) ×1 IMPLANT
CATH LAUNCHER 5F EBU3.0 (CATHETERS) IMPLANT
CATH LAUNCHER 6FR JR4 (CATHETERS) ×1 IMPLANT
CATHETER LAUNCHER 5F EBU3.0 (CATHETERS) ×2
DEVICE RAD COMP TR BAND LRG (VASCULAR PRODUCTS) ×1 IMPLANT
GLIDESHEATH SLEND A-KIT 6F 22G (SHEATH) ×1 IMPLANT
GUIDEWIRE INQWIRE 1.5J.035X260 (WIRE) IMPLANT
INQWIRE 1.5J .035X260CM (WIRE) ×2
KIT ENCORE 26 ADVANTAGE (KITS) ×1 IMPLANT
KIT HEART LEFT (KITS) ×2 IMPLANT
PACK CARDIAC CATHETERIZATION (CUSTOM PROCEDURE TRAY) ×2 IMPLANT
STENT PROMUS PREM MR 3.0X20 (Permanent Stent) ×1 IMPLANT
TRANSDUCER W/STOPCOCK (MISCELLANEOUS) ×2 IMPLANT
TUBING CIL FLEX 10 FLL-RA (TUBING) ×2 IMPLANT
WIRE ASAHI PROWATER 180CM (WIRE) ×1 IMPLANT

## 2017-07-24 NOTE — Progress Notes (Signed)
Asked by cardiology to add antiplatelet agent d/t patient needing CABG  Patient was loaded with brilinta in cath lab  Will add aggrestat 0.15 mcg/kg/min  Continue pending CABG Will monitor platelets   Levester Fresh, PharmD, BCPS, BCCCP Clinical Pharmacist 07/24/2017 8:05 PM

## 2017-07-24 NOTE — H&P (Addendum)
[name] is an 36 y.o. female.   Chief Complaint: CHEST  PAIN ,  ACS/STEMI  ED TRANSFER  FROM  [facility] TO [facility]  ED DR  Tamala Julian  NOTIFIED  PT  BEING  BROUGH TO CATH  LAB  AT  [facility] HPI:  [name] is a 36 y.o. female hx of DM but not on meds, obesity s/p lap band, he presented with chest pain, shortness of breath.  Intermittent chest pain over the last several days but she did not get checked out.  This morning she woke up around an hour prior to arrival with severe crushing chest pain radiated to the left arm and associated with shortness of breath.  She denies any back pain or abdominal pain or leg pain or numbness.  She states that she has no history of heart problems and denies any recent travels or leg swelling or calf pain.  She has a previous lap band.   The history is provided by the patient.   Patient not  On  meds for  DM      Past Medical History:  Diagnosis Date  . Diabetes mellitus    Family  History of CAD  There are no active problems to display for this patient.        Past Surgical History:  Procedure Laterality Date  . LAPAROSCOPIC GASTRIC BANDING  09/02/2008  . STOMACH SURGERY  1982   had metal removed from stomach as a child       OB History    No data available       Home Medications                      Prior to Admission medications   Medication Sig Start Date End Date Taking? Authorizing Provider  colchicine 0.6 MG tablet Take 0.6 mg by mouth as needed.    [provider]  LOESTRIN 24 FE 1-20 MG-MCG tablet  03/31/12   [provider]    Family History      Family History  Problem Relation Age of Onset  . Diabetes Father   . Hypertension Father   . Heart disease Father   . Cancer Maternal Grandmother        lung  . Cancer Maternal Grandfather        prostate    Social History Social History        Tobacco Use  . Smoking status: Never Smoker  . Smokeless tobacco:  Never Used  Substance Use Topics  . Alcohol use: Yes    Comment: occ  . Drug use: No     Allergies              Patient has no known allergies.   Review of Systems Review of Systems  Respiratory: Positive for shortness of breath.   Cardiovascular: Positive for chest pain.  All other systems reviewed and are negative.    Physical Exam Updated Vital Signs BP (!) 140/98 (BP Location: Right Arm)   Pulse 73   Temp 98.3 F (36.8 C) (Oral)   Resp 14   Ht 5\' 3"  (1.6 m)   Wt 117.9 kg (260 lb)   LMP 07/18/2017   SpO2 100%   BMI 46.06 kg/m   Physical Exam  Constitutional:  Uncomfortable, holding her chest   HENT:  Head: Normocephalic.  Eyes: Pupils are equal, round, and reactive to light.  Neck: Normal range of motion.  Cardiovascular: Normal rate and normal heart sounds.  Pulmonary/Chest: Effort normal and breath sounds normal. She exhibits no deformity.  Abdominal: Soft.  Musculoskeletal: Normal range of motion.       Right lower leg: Normal.       Left lower leg: Normal.  Neurological: She is alert.  Skin: Skin is warm. Capillary refill takes less than 2 seconds.  Nursing note and vitals reviewed.    ED Treatments / Results  Labs (all labs ordered are listed, but only abnormal results are displayed)      Labs Reviewed  I-STAT CHEM 8, ED - Abnormal; Notable for the following components:      Result Value    Glucose, Bld 250 (*)    Calcium, Ion 1.11 (*)    All other components within normal limits  CBC WITH DIFFERENTIAL/PLATELET  COMPREHENSIVE METABOLIC PANEL  APTT  PROTIME-INR  HEPARIN LEVEL (UNFRACTIONATED)  I-STAT TROPONIN, ED  I-STAT BETA HCG BLOOD, ED ([facility], [facility], [facility] ONLY)    EKG      EKG Interpretation  Date/Time:                        Sunday July 24 2017 05:13:05 EST Ventricular Rate:   93 PR Interval:                        QRS Duration:        79 QT Interval:                      343 QTC Calculation:    427 R  Axis:                         77  Text Interpretation:  Sinus rhythm Inferior infarct, acute (RCA) Probable RV involvement, suggest recording right precordial leads + STEMI, similar to earlier in the day  Confirmed by Wandra Arthurs [phone]) on 07/24/2017 5:33:30 AM       Radiology ImagingResults(Last48hours)  No results found.         Medications Ordered in ED at [facility] Medications  heparin ADULT infusion 100 units/mL (25000 units/270mL sodium chloride 0.45%) (1,000 Units/hr Intravenous New Bag/Given 07/24/17 0536)  aspirin 81 MG chewable tablet (  Given 07/24/17 0515)  nitroGLYCERIN (NITROSTAT) 0.4 MG SL tablet (  Given 07/24/17 0520)  sodium chloride 0.9 % bolus 1,000 mL (1,000 mLs Intravenous New Bag/Given 07/24/17 0536)  heparin bolus via infusion 4,000 Units (4,000 Units Intravenous Bolus from Bag 07/24/17 0535)     Initial Impression / Assessment and Plan / ED Course  I have reviewed the triage vital signs and the nursing notes.  Pertinent labs & imaging results that were available during my care of the patient were reviewed by me and considered in my medical decision making (see chart for details).  [name] is a 36 y.o. female here with chest pan that woke her from sleep. Initial EKG concerned for possible STEMI, repeat confirmed the same. Code STEMI activated at 5:09 am. We  called Dr. Tamala Julian, who will see patient at [facility] ED and  activated cath lab. . She took ASA 81 mg so I gave her 3 baby ASAs.  Nitro S/L   And  Hep gtt  Was  Initiated in [facility] ED I discussed  With Interventional cardiology  On  Call  And  Pt  Has  Given  Verbal  Consent to cardiac cath to Dr Tamala Julian        Past Medical History:  Diagnosis Date  . Diabetes mellitus     Past Surgical History:  Procedure Laterality Date  . LAPAROSCOPIC GASTRIC BANDING  09/02/2008  . STOMACH SURGERY  1982   had metal removed from stomach as a child    Family History  Problem Relation Age of Onset   . Diabetes Father   . Hypertension Father   . Heart disease Father   . Cancer Maternal Grandmother        lung  . Cancer Maternal Grandfather        prostate   Social History:  reports that  has never smoked. she has never used smokeless tobacco. She reports that she drinks alcohol. She reports that she does not use drugs.  Allergies: No Known Allergies   (Not in a hospital admission)  Results for orders placed or performed during the hospital encounter of 07/24/17 (from the past 48 hour(s))  CBC with Differential/Platelet     Status: None   Collection Time: 07/24/17  5:18 AM  Result Value Ref Range   WBC 9.8 4.0 - 10.5 K/uL   RBC 4.83 3.87 - 5.11 MIL/uL   Hemoglobin 13.5 12.0 - 15.0 g/dL   HCT 39.4 36.0 - 46.0 %   MCV 81.6 78.0 - 100.0 fL   MCH 28.0 26.0 - 34.0 pg   MCHC 34.3 30.0 - 36.0 g/dL   RDW 13.5 11.5 - 15.5 %   Platelets 209 150 - 400 K/uL   Neutrophils Relative % 73 %   Neutro Abs 7.1 1.7 - 7.7 K/uL   Lymphocytes Relative 19 %   Lymphs Abs 1.8 0.7 - 4.0 K/uL   Monocytes Relative 7 %   Monocytes Absolute 0.7 0.1 - 1.0 K/uL   Eosinophils Relative 1 %   Eosinophils Absolute 0.1 0.0 - 0.7 K/uL   Basophils Relative 0 %   Basophils Absolute 0.0 0.0 - 0.1 K/uL  APTT     Status: None   Collection Time: 07/24/17  5:18 AM  Result Value Ref Range   aPTT 24 24 - 36 seconds  Protime-INR     Status: None   Collection Time: 07/24/17  5:18 AM  Result Value Ref Range   Prothrombin Time 12.6 11.4 - 15.2 seconds   INR 0.95   I-Stat Beta hCG blood, ED ([facility], [facility], [facility] only)     Status: None   Collection Time: 07/24/17  5:35 AM  Result Value Ref Range   I-stat hCG, quantitative <5.0 <5 mIU/mL   Comment 3            Comment:   GEST. AGE      CONC.  (mIU/mL)   <=1 WEEK        5 - 50     2 WEEKS       50 - 500     3 WEEKS       100 - 10,000     4 WEEKS     1,000 - 30,000        FEMALE AND NON-PREGNANT FEMALE:     LESS THAN 5 mIU/mL   I-stat chem 8, ed     Status: Abnormal    Collection Time: 07/24/17  5:36 AM  Result Value Ref Range   Sodium 139 135 - 145 mmol/L   Potassium 4.8 3.5 - 5.1 mmol/L   Chloride 103 101 - 111 mmol/L  BUN 13 6 - 20 mg/dL   Creatinine, Ser 0.80 0.44 - 1.00 mg/dL   Glucose, Bld 250 (H) 65 - 99 mg/dL   Calcium, Ion 1.11 (L) 1.15 - 1.40 mmol/L   TCO2 24 22 - 32 mmol/L   Hemoglobin 13.9 12.0 - 15.0 g/dL   HCT 41.0 36.0 - 46.0 %  I-stat troponin, ED     Status: None   Collection Time: 07/24/17  5:37 AM  Result Value Ref Range   Troponin i, poc 0.03 0.00 - 0.08 ng/mL   Comment 3            Comment: Due to the release kinetics of cTnI, a negative result within the first hours of the onset of symptoms does not rule out myocardial infarction with certainty. If myocardial infarction is still suspected, repeat the test at appropriate intervals.    No results found.  ROS  Blood pressure (!) 140/98, pulse 73, temperature 98.3 F (36.8 C), temperature source Oral, resp. rate 14, height 5\' 3"  (1.6 m), weight 117.9 kg (260 lb), last menstrual period 07/18/2017, SpO2 100 %. Physical Exam   Assessment/Plan INFERIOR  STEMI HEP GTT  BOLUS  GIVEN  AT  [facility] ED  ACE  STEMI  Orders  Placed Heparin  Bolus   To be  Given in [facility]  ED  Pt  Still having  Chest pain 3/10  Vitals  130/70 HR  70's Pt  Taken  To cath  Lab    H/O  DM: SSi started,  Will need IM, subsequent  Endocrinology   Statin , asa, Betablocker, asa, Lisinopril Subsequent Anti platelets  Will need optimised  Based on BP , and EF   Johna Sheriff, MD 07/24/2017, 5:57 AM

## 2017-07-24 NOTE — ED Notes (Signed)
Pt's husband did not want a female to do an EKG.

## 2017-07-24 NOTE — Progress Notes (Signed)
Informed   by  Nursing  Staff about  Brilinta  Being D/c ed  By  CT   Surgery  In lieu of  CABG  Plans later  This  Week.  I then  Discussed  With Interventional cardiology Dr Tamala Julian  And conveyed his  recs of  Plan of  GPIIB  IIA  Anti platelets  Therapy  Of  Aggrastat  And  recommended consulting  Pharmacy , Pharmacy  Consulted.

## 2017-07-24 NOTE — ED Triage Notes (Signed)
Pt reports waking up and having chest pain and shortness of breath along with hot flashes and cold chills.

## 2017-07-24 NOTE — ED Notes (Signed)
Called report to [place] Warehouse manager at [employer].

## 2017-07-24 NOTE — Progress Notes (Signed)
ANTICOAGULATION CONSULT NOTE - Initial Consult  Pharmacy Consult for Tirofiban Indication: chest pain/ACS  No Known Allergies  Patient Measurements: Height: 5\' 3"  (160 cm) Weight: 260 lb (117.9 kg) IBW/kg (Calculated) : 52.4  Vital Signs: Temp: 98.3 F (36.8 C) (11/04 0507) Temp Source: Oral (11/04 0507) BP: 102/59 (11/04 0722) Pulse Rate: 0 (11/04 0757)  Labs: Recent Labs    07/24/17 0518 07/24/17 0536  HGB 13.5 13.9  HCT 39.4 41.0  PLT 209  --   APTT 24  --   LABPROT 12.6  --   INR 0.95  --   CREATININE 0.86 0.80    Estimated Creatinine Clearance: 119.5 mL/min (by C-G formula based on SCr of 0.8 mg/dL).   Medical History: Past Medical History:  Diagnosis Date  . Diabetes mellitus     Assessment: 41 yoF presents with STEMI now s/p stenting to RCA complicated by difficulty with IV access. Pharmacy consulted to continue tirofiban post-cath. Discussed with Dr. Tamala Julian who only wants 4 hours of infusion as pt has been loaded with ticagrelor.  Goal of Therapy:  Monitor platelets by anticoagulation protocol: Yes   Plan:  -Tirofiban 0.15 mcg/kg/min x4 hours -Pharmacy will sign off, please reconsult as needed  Arrie Senate, PharmD PGY-2 Cardiology Pharmacy Resident Pager: [phone] 07/24/2017

## 2017-07-24 NOTE — CV Procedure (Addendum)
   36 year old with strong family history of CAD, prior diabetes in her early 34s, morbid obesity, status post bariatric surgery with recurrent weight gain.  Presented with chest discomfort that awakened her from sleep and in the emergency department had ST elevation in the inferior leads compatible with inferior STEMI.  RCA totally occluded in the mid vessel.  High-grade obstruction in the proximal and mid LAD.  High-grade obstruction in the mid circumflex.  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   Procedure was complicated by loss of venous access midway through the procedure, inability to adequately anticoagulate because of IV infiltration, and low ACT of around 192 seconds after stent implantation.  Subsequently intra-arterial heparin was administered.  Closing ACT 290 seconds.  Second IV site started and a bolus and infusion of Aggrastat was started.  Also loaded with Brilinta.  Will need debate concerning definitive therapy which classically should include coronary bypass surgery but at her young age multisite PCI is also an option.

## 2017-07-24 NOTE — Consult Note (Signed)
[address]       ,[place] [address]             [phone]        Keenan D Nichols [place] Medical Record #[id number] Date of Birth: [birth year]  Referring: Dr. Daneen Schick, MD Primary Care: Maury Dus, MD  Chief Complaint:    Chief Complaint  Patient presents with  . Shortness of Breath  . Chest Pain   Reason for consultation: Coronary artery disease  History of Present Illness:     This is a 36 year old African American female with a past medical history of diabetes mellitus (currently not on medication), obesity (s/p lap band 09') who presented to [facility] earlier this am with complaints of chest pain and shortness of breath. Apparently, she had been having these symptoms for the last several weeks, but did not seek evaluation until this am. The chest pain was described as "crushing" and she stated she also felt fluttering or palpitations. She denies any prior history of cardiac disease, denies dizziness, syncope, back or abdominal pain.   EKG showed sinus rhythm, inferior infarct, acute (RCA) probable RV involvement. Initial Troponin I was 0.03;however subsequent Troponin I was 65!Marland Kitchen She ruled in for a STEMI. Dr. Tamala Julian performed a cardiac catheterization and subsequent angioplasty with stent to the mid RCA. She was given an Aggrastat infusion as well as loaded with Brilinta. A cardiothoracic consultation was obtained with Dr. Roxy Manns for the consideration of coronary artery bypass grafting surgery. Currently, she denies chest pain and her vital signs are stable.   Current Activity/ Functional Status: Patient is independent with mobility/ambulation, transfers, ADL's, IADL's.   Zubrod Score: At the time of surgery this patient's most appropriate activity status/level should be described as: []     0    Normal activity, no symptoms [x]     1    Restricted in physical strenuous activity but ambulatory, able to do out light work []     2     Ambulatory and capable of self care, unable to do work activities, up and about                 more than 50%  Of the time                            []     3    Only limited self care, in bed greater than 50% of waking hours []     4    Completely disabled, no self care, confined to bed or chair []     5    Moribund  Past Medical History:  Diagnosis Date  . Diabetes mellitus     Past Surgical History:  Procedure Laterality Date  . LAPAROSCOPIC GASTRIC BANDING  09/02/2008  . STOMACH SURGERY  1982   had metal removed from stomach as a child    Social History   Socioeconomic History  . Marital status: Single    Spouse name: Not on file  . Number of children: Not on file  . Years of education: Not on file  . Highest education level: Not on file  Social Needs  . Financial resource strain: Not on file  . Food insecurity - worry: Not on file  . Food insecurity - inability: Not on file  . Transportation needs - medical: Not on file  . 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 Smoking status: Never Smoker  . Smokeless tobacco: Never Used  Substance and Sexual Activity  . Alcohol use: Yes socially    Comment: occ  . Drug use: No  . Sexual activity: Not on file   Allergies: No Known Allergies  Current Facility-Administered Medications  Medication Dose Route Frequency Provider Last Rate Last Dose  . 0.9 %  sodium chloride infusion   Intravenous Continuous Vedre, Ameeth, MD      . 0.9 %  sodium chloride infusion  250 mL Intravenous PRN Belva Crome, MD      . 0.9% sodium chloride infusion  0.75 mL/kg/hr Intravenous Continuous Belva Crome, MD 88.4 mL/hr at 07/24/17 0923 0.75 mL/kg/hr at 07/24/17 0923  . acetaminophen (TYLENOL) tablet 650 mg  650 mg Oral Q4H PRN Belva Crome, MD      . ALPRAZolam Duanne Moron) tablet 1 mg  1 mg Oral BID PRN Belva Crome, MD      . Derrill Memo ON 07/25/2017] aspirin  chewable tablet 81 mg  81 mg Oral Daily Belva Crome, MD      . atorvastatin (LIPITOR) tablet 80 mg  80 mg Oral q1800 Belva Crome, MD      . carvedilol (COREG) tablet 3.125 mg  3.125 mg Oral BID WC Belva Crome, MD   3.125 mg at 07/24/17 0943  . [START ON 07/25/2017] heparin injection 5,000 Units  5,000 Units Subcutaneous Q8H Belva Crome, MD      . hydrALAZINE (APRESOLINE) injection 5 mg  5 mg Intravenous Q20 Min PRN Belva Crome, MD      . insulin aspart (novoLOG) injection 0-15 Units  0-15 Units Subcutaneous TID WC Belva Crome, MD      . insulin aspart (novoLOG) injection 3 Units  3 Units Subcutaneous TID WC Belva Crome, MD      . labetalol (NORMODYNE,TRANDATE) injection 10 mg  10 mg Intravenous Q10 min PRN Belva Crome, MD      . ondansetron [facility]) injection 4 mg  4 mg Intravenous Q6H PRN Belva Crome, MD   4 mg at 07/24/17 1422  . oxyCODONE (Oxy IR/ROXICODONE) immediate release tablet 5-10 mg  5-10 mg Oral Q4H PRN Belva Crome, MD   10 mg at 07/24/17 1422  . sodium chloride flush (NS) 0.9 % injection 3 mL  3 mL Intravenous Q12H Belva Crome, MD      . sodium chloride flush (NS) 0.9 % injection 3 mL  3 mL Intravenous PRN Belva Crome, MD      . ticagrelor [facility]) tablet 90 mg  90 mg Oral BID Belva Crome, MD        Medications Prior to Admission  Medication Sig Dispense Refill Last Dose  . APPLE CIDER VINEGAR PO Take 2-4 mg daily by mouth.   Past Week at Unknown time  . colchicine 0.6 MG tablet Take 0.6 mg by mouth as needed.   unknown at prn  . Misc Natural Products (DAILY HERBS BONE/JOINTS PO) Take 3-5 tablets daily by mouth. Insta flex   07/23/2017 at Unknown time    Family History  Problem Relation Age of Onset  . Diabetes Father   . Hypertension Father   . Heart disease Father   . Cancer Maternal Grandmother        lung  . Cancer Maternal Grandfather  prostate   Review of Systems:    Cardiac Review of Systems: Y or N  Chest Pain [ Y   ]   Resting SOB [  N ] Exertional SOB  [ N ]  Orthopnea [ N ]   Pedal Edema [ N  ]    Palpitations [ Y ] Syncope  Aqua.Slicker  ]   Presyncope [  N ]  General ReviNew of Systems: [Y] = yes [N  ]=no Constitional: recent weight change [ Y ]; anorexia [N  ];  nausea Aqua.Slicker  ]; night sweats [ N ]; fever [ N ]; or chills [ N ]                                                               Eye : blurred vision [ N ]; diplopia [ N  ]; Resp: cough [ N ];  wheezing[ N ];  hemoptysis[N  ];  GI:  vomiting[ N ];  dysphagia[ N ]; melena[ N ];  hematochezia [ N ];              GU: hematuria[N  ];   dysuria [ N ];  nocturia[ N ]; Heme/Lymph: bruising[ N ];  bleeding[  ];  anemia[  ];  Neuro: Sharlene.Ates  ];  headaches[ N ];  stroke[ N ];  vertigo[ N ];  seizures[ N ];     Psych:depression[N  ]; anxiety[N  ];  Endocrine: diabetes[Y  ];  thyroid dysfunction[  N];    Physical Exam: BP 123/88   Pulse 77   Temp 98.7 F (37.1 C) (Oral)   Resp (!) 21   Ht 5\' 3"  (1.6 m)   Wt 279 lb 1.6 oz (126.6 kg)   LMP 07/18/2017   SpO2 100%   BMI 49.44 kg/m    General appearance: alert, cooperative and no distress Head: Normocephalic, without obvious abnormality, atraumatic Neck: no JVD, supple, symmetrical, trachea midline and thyroid not enlarged, symmetric, no tenderness/mass/nodules Cardio: RRR, no murmur GI: Soft, obese, non tender, bowel sounds present Extremities: No LE edema;DP/PT intact Neurologic: Grossly normal  Diagnostic Studies & Laboratory data:     Recent Radiology Findings:   Dg Chest Port 1 View  Result Date: 07/24/2017 CLINICAL DATA:  Awoke with chest pain and shortness of breath. EXAM: PORTABLE CHEST 1 VIEW COMPARISON:  09/02/2008 FINDINGS: The cardiomediastinal contours are normal. Heart size upper normal likely normal for technique. Pulmonary vasculature is normal. No consolidation, pleural effusion, or pneumothorax. No acute osseous abnormalities are seen. IMPRESSION: No acute pulmonary process. Electronically  Signed   By: Jeb Levering M.D.   On: 07/24/2017 05:58    I have independently reviewed the above radiologic studies.  Recent Lab Findings: Lab Results  Component Value Date   WBC 9.8 07/24/2017   HGB 13.8 07/24/2017   HCT 41.9 07/24/2017   PLT 217 07/24/2017   GLUCOSE 250 (H) 07/24/2017   ALT 18 07/24/2017   AST 26 07/24/2017   NA 139 07/24/2017   K 4.8 07/24/2017   CL 103 07/24/2017   CREATININE 0.85 07/24/2017   BUN 13 07/24/2017   CO2 23 07/24/2017   INR 0.95 07/24/2017   Assessment / Plan:     1. S/p STEMI, s/p sngioplasty with stent  to RCA-on Tirofiban.  Dr. Roxy Manns to evaluate whether coronary artery bypass surgery is best treatment option. 2. Diabetes Mellitus-is not currently on medication. After her lap band, with loss of weight, she was able to get off of the medication. Check HGA1C to determine if needs medication. Hopefully, in time, she become more active and lose weight to help.   Lars Pinks PA-C 07/24/2017 2:24 PM   I have seen and examined the patient and agree with the assessment above.  Patient is a 36 year old morbidly obese African-American female with history of type 2 diabetes mellitus, hyperlipidemia, and a strong family history of coronary artery disease who describes a 2-week history of angina pectoris and presented acutely early this morning with an acute inferior wall ST segment elevation myocardial infarction.  She was taken directly to the cardiac Cath Lab by Dr. Tamala Julian and was found to have 100% acute proximal occlusion of the right coronary artery.  She underwent PCI and stenting of the mid right coronary artery to reestablish flow.  Subsequent coronary angiography revealed severe three-vessel coronary artery disease with high-grade proximal stenosis in all 3 major vascular territories.  The patient was loaded with Brilinta and Brilinta was continued following catheterization.  Cardiothoracic surgical consultation was requested.  The patient has  remained clinically stable with no further chest pain or shortness of breath since she was admitted to the ICU.  I have personally reviewed the patient's chart, blood work, and diagnostic cardiac catheterization films.  There remains severe three-vessel coronary artery disease with 95% proximal stenosis of the left anterior descending coronary artery, 80-90% stenosis of the mid left anterior descending coronary artery stenosis, 70% stenosis of the first diagonal branch, 90% stenosis of the left circumflex coronary artery and high-grade stenosis of the posterior descending coronary artery and continuation of the right coronary artery at the bifurcation.  I feel the patient would best be treated with surgical revascularization.  I have reviewed the indications, risks, and potential benefits of coronary artery bypass grafting with the patient and her boyfriend at the bedside.  Alternative treatment strategies have been discussed, including the relative risks, benefits and long term prognosis associated with medical therapy, percutaneous coronary intervention, and surgical revascularization.  The patient understands and accepts all potential associated risks of surgery including but not limited to risk of death, stroke or other neurologic complication, myocardial infarction, congestive heart failure, respiratory failure, renal failure, bleeding requiring blood transfusion and/or reexploration, aortic dissection or other major vascular complication, arrhythmia, heart block or bradycardia requiring permanent pacemaker, pneumonia, pleural effusion, wound infection, pulmonary embolus or other thromboembolic complication, chronic pain or other delayed complications related to median sternotomy, or the late recurrence of symptomatic ischemic heart disease and/or congestive heart failure.  The importance of long term risk modification have been emphasized.  All questions answered.  Because the patient was loaded with  Brilinta surgery will need to be delayed several days.  I will be out of town and one of my partners will follow up early this week and make final plans.   I spent in excess of 60 minutes during the conduct of this hospital encounter and >50% of this time involved direct face-to-face encounter with the patient for counseling and/or coordination of their care.   Rexene Alberts, MD 07/24/2017 5:19 PM

## 2017-07-24 NOTE — ED Provider Notes (Signed)
[facility] DEPT Provider Note   CSN: [id number] Arrival date & time: 07/24/17  0453     History   Chief Complaint Chief Complaint  Patient presents with  . Shortness of Breath  . Chest Pain    HPI [name] is a 36 y.o. female hx of DM but not on meds, obesity s/p lap band, he presented with chest pain, shortness of breath.  Intermittent chest pain over the last several days but she did not get checked out.  This morning she woke up around an hour prior to arrival with severe crushing chest pain radiated to the left arm and associated with shortness of breath.  She denies any back pain or abdominal pain or leg pain or numbness.  She states that she has no history of heart problems and denies any recent travels or leg swelling or calf pain.  She has a previous lap band.   The history is provided by the patient.    Past Medical History:  Diagnosis Date  . Diabetes mellitus     There are no active problems to display for this patient.   Past Surgical History:  Procedure Laterality Date  . LAPAROSCOPIC GASTRIC BANDING  09/02/2008  . STOMACH SURGERY  1982   had metal removed from stomach as a child    OB History    No data available       Home Medications    Prior to Admission medications   Medication Sig Start Date End Date Taking? Authorizing Provider  colchicine 0.6 MG tablet Take 0.6 mg by mouth as needed.    [provider]  LOESTRIN 24 FE 1-20 MG-MCG tablet  03/31/12   [provider]    Family History Family History  Problem Relation Age of Onset  . Diabetes Father   . Hypertension Father   . Heart disease Father   . Cancer Maternal Grandmother        lung  . Cancer Maternal Grandfather        prostate    Social History Social History   Tobacco Use  . Smoking status: Never Smoker  . Smokeless tobacco: Never Used  Substance Use Topics  . Alcohol use: Yes    Comment: occ  . Drug use: No      Allergies   Patient has no known allergies.   Review of Systems Review of Systems  Respiratory: Positive for shortness of breath.   Cardiovascular: Positive for chest pain.  All other systems reviewed and are negative.    Physical Exam Updated Vital Signs BP (!) 140/98 (BP Location: Right Arm)   Pulse 73   Temp 98.3 F (36.8 C) (Oral)   Resp 14   Ht 5\' 3"  (1.6 m)   Wt 117.9 kg (260 lb)   LMP 07/18/2017   SpO2 100%   BMI 46.06 kg/m   Physical Exam  Constitutional:  Uncomfortable, holding her chest   HENT:  Head: Normocephalic.  Eyes: Pupils are equal, round, and reactive to light.  Neck: Normal range of motion.  Cardiovascular: Normal rate and normal heart sounds.  Pulmonary/Chest: Effort normal and breath sounds normal. She exhibits no deformity.  Abdominal: Soft.  Musculoskeletal: Normal range of motion.       Right lower leg: Normal.       Left lower leg: Normal.  Neurological: She is alert.  Skin: Skin is warm. Capillary refill takes less than 2 seconds.  Nursing note and vitals reviewed.  ED Treatments / Results  Labs (all labs ordered are listed, but only abnormal results are displayed) Labs Reviewed  I-STAT CHEM 8, ED - Abnormal; Notable for the following components:      Result Value   Glucose, Bld 250 (*)    Calcium, Ion 1.11 (*)    All other components within normal limits  CBC WITH DIFFERENTIAL/PLATELET  COMPREHENSIVE METABOLIC PANEL  APTT  PROTIME-INR  HEPARIN LEVEL (UNFRACTIONATED)  I-STAT TROPONIN, ED  I-STAT BETA HCG BLOOD, ED ([facility], [facility], [facility] ONLY)    EKG  EKG Interpretation  Date/Time:  Sunday July 24 2017 05:13:05 EST Ventricular Rate:  93 PR Interval:    QRS Duration: 79 QT Interval:  343 QTC Calculation: 427 R Axis:   77 Text Interpretation:  Sinus rhythm Inferior infarct, acute (RCA) Probable RV involvement, suggest recording right precordial leads + STEMI, similar to earlier in the day  Confirmed by Wandra Arthurs  [phone]) on 07/24/2017 5:33:30 AM       Radiology No results found.  Procedures Procedures (including critical care time)  CRITICAL CARE Performed by: Wandra Arthurs   Total critical care time: 30 minutes  Critical care time was exclusive of separately billable procedures and treating other patients.  Critical care was necessary to treat or prevent imminent or life-threatening deterioration.  Critical care was time spent personally by me on the following activities: development of treatment plan with patient and/or surrogate as well as nursing, discussions with consultants, evaluation of patient's response to treatment, examination of patient, obtaining history from patient or surrogate, ordering and performing treatments and interventions, ordering and review of laboratory studies, ordering and review of radiographic studies, pulse oximetry and re-evaluation of patient's condition.  Angiocath insertion Performed by: Wandra Arthurs  Consent: Verbal consent obtained. Risks and benefits: risks, benefits and alternatives were discussed Time out: Immediately prior to procedure a "time out" was called to verify the correct patient, procedure, equipment, support staff and site/side marked as required.  Preparation: Patient was prepped and draped in the usual sterile fashion.  Vein Location: R antecube  Ultrasound Guided  Gauge: 20 long  Normal blood return and flush without difficulty Patient tolerance: Patient tolerated the procedure well with no immediate complications.     Medications Ordered in ED Medications  heparin ADULT infusion 100 units/mL (25000 units/245mL sodium chloride 0.45%) (1,000 Units/hr Intravenous New Bag/Given 07/24/17 0536)  aspirin 81 MG chewable tablet (  Given 07/24/17 0515)  nitroGLYCERIN (NITROSTAT) 0.4 MG SL tablet (  Given 07/24/17 0520)  sodium chloride 0.9 % bolus 1,000 mL (1,000 mLs Intravenous New Bag/Given 07/24/17 0536)  heparin bolus via infusion  4,000 Units (4,000 Units Intravenous Bolus from Bag 07/24/17 0535)     Initial Impression / Assessment and Plan / ED Course  I have reviewed the triage vital signs and the nursing notes.  Pertinent labs & imaging results that were available during my care of the patient were reviewed by me and considered in my medical decision making (see chart for details).    [name] is a 36 y.o. female here with chest pan that woke her from sleep. Initial EKG concerned for possible STEMI, repeat confirmed the same. Code STEMI activated at 5:09 am. I called Dr. Tamala Julian, who will see patient at [facility] ED and will activate cath lab. I called Dr. Betsey Holiday at [facility], who accepted transfer. She took ASA 81 mg so I gave her 3 baby ASAs. Given nitro, heparin.  Final Clinical Impressions(s) / ED Diagnoses   Final diagnoses:  ST elevation myocardial infarction (STEMI) involving other coronary artery [facility])    New Prescriptions This SmartLink is deprecated. Use AVSMEDLIST instead to display the medication list for a patient.   Drenda Freeze, MD 07/24/17 [phone]

## 2017-07-24 NOTE — Progress Notes (Signed)
ANTICOAGULATION CONSULT NOTE - Initial Consult  Pharmacy Consult for IV heparin Indication: chest pain/ACS  No Known Allergies  Patient Measurements: Height: 5\' 3"  (160 cm) Weight: 260 lb (117.9 kg) IBW/kg (Calculated) : 52.4 Heparin Dosing Weight: 72 kg  Vital Signs: Temp: 98.3 F (36.8 C) (11/04 0507) Temp Source: Oral (11/04 0507) BP: 140/98 (11/04 0507) Pulse Rate: 73 (11/04 0507)  Labs: No results for input(s): HGB, HCT, PLT, APTT, LABPROT, INR, HEPARINUNFRC, HEPRLOWMOCWT, CREATININE, CKTOTAL, CKMB, TROPONINI in the last 72 hours.  CrCl cannot be calculated (No order found.).   Medical History: Past Medical History:  Diagnosis Date  . Diabetes mellitus     Medications:  Scheduled:  . aspirin      . heparin  4,000 Units Intravenous Once  . nitroGLYCERIN       Infusions:  . heparin    . sodium chloride      Assessment: 47 yoF c/o SOB and CP.  IV heparin for ACS.  Goal of Therapy:  Heparin level 0.3-0.7 units/ml Monitor platelets by anticoagulation protocol: Yes   Plan:  Baseline coags STAT Heparin 4000 unit bolus x1 now Start drip at 1000 units/hr Daily CBC/HL Check 1st HL in 6 hours  Dorrene German 07/24/2017,5:28 AM

## 2017-07-25 ENCOUNTER — Inpatient Hospital Stay (HOSPITAL_COMMUNITY): Payer: BC Managed Care – PPO

## 2017-07-25 ENCOUNTER — Encounter (HOSPITAL_COMMUNITY): Payer: Self-pay | Admitting: Interventional Cardiology

## 2017-07-25 DIAGNOSIS — I2119 ST elevation (STEMI) myocardial infarction involving other coronary artery of inferior wall: Secondary | ICD-10-CM

## 2017-07-25 LAB — BASIC METABOLIC PANEL
Anion gap: 6 (ref 5–15)
BUN: 9 mg/dL (ref 6–20)
CO2: 23 mmol/L (ref 22–32)
Calcium: 7.9 mg/dL — ABNORMAL LOW (ref 8.9–10.3)
Chloride: 109 mmol/L (ref 101–111)
Creatinine, Ser: 0.84 mg/dL (ref 0.44–1.00)
GFR calc Af Amer: >60 mL/min (ref >60)
GFR calc non Af Amer: >60 mL/min (ref >60)
Glucose, Bld: 159 mg/dL — ABNORMAL HIGH (ref 65–99)
Potassium: 3.5 mmol/L (ref 3.5–5.1)
Sodium: 138 mmol/L (ref 135–145)

## 2017-07-25 LAB — CBC
HCT: 35.4 % — ABNORMAL LOW (ref 36.0–46.0)
Hemoglobin: 11.8 g/dL — ABNORMAL LOW (ref 12.0–15.0)
MCH: 27.1 pg (ref 26.0–34.0)
MCHC: 33.3 g/dL (ref 30.0–36.0)
MCV: 81.4 fL (ref 78.0–100.0)
Platelets: 199 10*3/uL (ref 150–400)
RBC: 4.35 MIL/uL (ref 3.87–5.11)
RDW: 13.7 % (ref 11.5–15.5)
WBC: 9.6 10*3/uL (ref 4.0–10.5)

## 2017-07-25 LAB — GLUCOSE, CAPILLARY
Glucose-Capillary: 197 mg/dL — ABNORMAL HIGH (ref 65–99)
Glucose-Capillary: 152 mg/dL — ABNORMAL HIGH (ref 65–99)
Glucose-Capillary: 206 mg/dL — ABNORMAL HIGH (ref 65–99)
Glucose-Capillary: 116 mg/dL — ABNORMAL HIGH (ref 65–99)
Glucose-Capillary: 131 mg/dL — ABNORMAL HIGH (ref 65–99)

## 2017-07-25 LAB — TROPONIN I: Troponin I: 53.55 ng/mL (ref <0.03)

## 2017-07-25 LAB — ECHOCARDIOGRAM COMPLETE
Height: 63 in
Weight: 4465.64 oz

## 2017-07-25 LAB — HEMOGLOBIN A1C
Hgb A1c MFr Bld: 7.4 % — ABNORMAL HIGH (ref 4.8–5.6)
Mean Plasma Glucose: 165.68 mg/dL

## 2017-07-25 LAB — POCT ACTIVATED CLOTTING TIME: Activated Clotting Time: 290 seconds

## 2017-07-25 MED ORDER — MOVING RIGHT ALONG BOOK
Freq: Once | Status: DC
  Filled 2017-07-25: qty 1

## 2017-07-25 MED ORDER — POTASSIUM CHLORIDE CRYS ER 20 MEQ PO TBCR
40.0000 meq | EXTENDED_RELEASE_TABLET | Freq: Once | ORAL | Status: AC
  Administered 2017-07-25: 40 meq via ORAL
  Filled 2017-07-25: qty 2

## 2017-07-25 MED ORDER — ~~LOC~~ CARDIAC SURGERY, PATIENT & FAMILY EDUCATION
Freq: Once | Status: AC
  Administered 2017-07-25: 1
  Filled 2017-07-25: qty 1

## 2017-07-25 NOTE — Progress Notes (Signed)
  Echocardiogram 2D Echocardiogram has been performed.  [name] 07/25/2017, 3:29 PM

## 2017-07-25 NOTE — Care Management Note (Signed)
Case Management Note Marvetta Gibbons RN, BSN Unit 4E-Case Manager-- [place] coverage [phone]  Patient Details  Name: [name] MRN: [id number] Date of Birth: 03-16-1981  Subjective/Objective:  Pt admitted with STEMI s/p angioplasty and stent to the mid RCA with severe 3 vessel disease- CT surgery met with the her and  Plan to proceed with CABG.  Per MD note -She was given aggrastat and loaded with Brilinta after PCI so the CABG will be delayed until later this week                   Action/Plan: PTA pt lived at home with spouse- independent- CM to follow post op for d/c needs  Expected Discharge Date:                  Expected Discharge Plan:  Home/Self Care  In-House Referral:     Discharge planning Services  CM Consult, Medication Assistance  Post Acute Care Choice:    Choice offered to:     DME Arranged:    DME Agency:     HH Arranged:    [place] Agency:     Status of Service:  In process, will continue to follow  If discussed at Long Length of Stay Meetings, dates discussed:    Discharge Disposition:   Additional Comments:  Dawayne Patricia, RN 07/25/2017, 10:17 AM

## 2017-07-25 NOTE — Progress Notes (Signed)
Pt was having ECHO procedure done when arrived to walk with her and complete pre op education. Will follow up with pt tomorrow.   Carma Lair MS, ACSM CEP 3:25 PM 07/25/2017

## 2017-07-25 NOTE — Plan of Care (Signed)
Pt had stable night

## 2017-07-25 NOTE — Plan of Care (Signed)
Pt out of bed today without any chest pain, able to void normally, had small BM, ate meals, asking good questions and coping with plan for upcoming surgery, Vascular access site is unremarkable and healing well.

## 2017-07-25 NOTE — Progress Notes (Signed)
Progress Note  Patient Name: [name] Date of Encounter: 07/25/2017  Primary Cardiologist: none   Subjective   Reports she had some shortness of breath overnight but is feeling alright and free of chest pain or difficulty breathing today. Says she feels ready to go home.   Inpatient Medications    Scheduled Meds: . aspirin  81 mg Oral Daily  . atorvastatin  80 mg Oral q1800  . carvedilol  3.125 mg Oral BID WC  . heparin  5,000 Units Subcutaneous Q8H  . insulin aspart  0-15 Units Subcutaneous TID WC  . insulin aspart  3 Units Subcutaneous TID WC  . sodium chloride flush  3 mL Intravenous Q12H   Continuous Infusions: . sodium chloride    . sodium chloride    . tirofiban 0.15 mcg/kg/min (07/25/17 0445)   PRN Meds: sodium chloride, acetaminophen, ALPRAZolam, ondansetron (ZOFRAN) IV, oxyCODONE, [START ON 08/03/2017] pneumococcal 23 valent vaccine, sodium chloride flush   Vital Signs    Vitals:   07/25/17 0500 07/25/17 0600 07/25/17 0700 07/25/17 0823  BP: 102/62 107/69 (!) 89/48 107/75  Pulse: 90 85 86 81  Resp: 20 (!) 22 15   Temp:    97.7 F (36.5 C)  TempSrc:    Oral  SpO2: 98% 99% 100%   Weight:      Height:        Intake/Output Summary (Last 24 hours) at 07/25/2017 0847 Last data filed at 07/25/2017 0500 Gross per 24 hour  Intake 1770.91 ml  Output 1800 ml  Net -29.09 ml   Filed Weights   07/24/17 0508 07/24/17 0900  Weight: 260 lb (117.9 kg) 279 lb 1.6 oz (126.6 kg)    Telemetry    Sinus rhythm - Personally Reviewed  ECG    Sinus rhythm, T wave inversions and q waves in the inferior leads- Personally Reviewed  Physical Exam   GEN: Sitting up eating breakfast, No acute distress.   Neck: No JVD Cardiac: RRR, no murmurs, rubs, or gallops.  Respiratory: Clear to auscultation bilaterally. GI: Soft, nontender, non-distended  MS: trace edema; No deformity. Neuro:  Nonfocal  Psych: Normal affect   Labs    Chemistry Recent Labs  Lab  07/24/17 0518 07/24/17 0536 07/24/17 1105 07/25/17 0407  NA 135 139  --  138  K 4.8 4.8  --  3.5  CL 103 103  --  109  CO2 23  --   --  23  GLUCOSE 256* 250*  --  159*  BUN 13 13  --  9  CREATININE 0.86 0.80 0.85 0.84  CALCIUM 8.5*  --   --  7.9*  PROT 6.7  --   --   --   ALBUMIN 3.2*  --   --   --   AST 26  --   --   --   ALT 18  --   --   --   ALKPHOS 65  --   --   --   BILITOT 0.6  --   --   --   GFRNONAA >60  --  >60 >60  GFRAA >60  --  >60 >60  ANIONGAP 9  --   --  6     Hematology Recent Labs  Lab 07/24/17 0518 07/24/17 0536 07/24/17 1105 07/25/17 0407  WBC 9.8  --  9.8 9.6  RBC 4.83  --  5.12* 4.35  HGB 13.5 13.9 13.8 11.8*  HCT 39.4 41.0 41.9 35.4*  MCV 81.6  --  81.8 81.4  MCH 28.0  --  27.0 27.1  MCHC 34.3  --  32.9 33.3  RDW 13.5  --  13.4 13.7  PLT 209  --  217 199    Cardiac Enzymes Recent Labs  Lab 07/24/17 1105 07/24/17 1845 07/24/17 2356  TROPONINI >65.00* 61.51* 53.55*    Recent Labs  Lab 07/24/17 0537  TROPIPOC 0.03      BNPNo results for input(s): BNP, PROBNP in the last 168 hours.   DDimer No results for input(s): DDIMER in the last 168 hours.   Radiology    Dg Chest Port 1 View  Result Date: 07/25/2017 CLINICAL DATA:  Congestive heart failure. EXAM: PORTABLE CHEST 1 VIEW COMPARISON:  Radiograph of July 24, 2017. FINDINGS: The heart size and mediastinal contours are within normal limits. Both lungs are clear. No pneumothorax or pleural effusion is noted. The visualized skeletal structures are unremarkable. IMPRESSION: No acute cardiopulmonary abnormality seen. Electronically Signed   By: Marijo Conception, M.D.   On: 07/25/2017 07:55   Dg Chest Port 1 View  Result Date: 07/24/2017 CLINICAL DATA:  Awoke with chest pain and shortness of breath. EXAM: PORTABLE CHEST 1 VIEW COMPARISON:  09/02/2008 FINDINGS: The cardiomediastinal contours are normal. Heart size upper normal likely normal for technique. Pulmonary vasculature is  normal. No consolidation, pleural effusion, or pneumothorax. No acute osseous abnormalities are seen. IMPRESSION: No acute pulmonary process. Electronically Signed   By: Jeb Levering M.D.   On: 07/24/2017 05:58    Cardiac Studies    Catheterization 51/52   36 year old with strong family history of CAD, prior diabetes in her early 74s, morbid obesity, status post bariatric surgery with recurrent weight gain.  Presented with chest discomfort that awakened her from sleep and in the emergency department had ST elevation in the inferior leads compatible with inferior STEMI.  RCA totally occluded in the mid vessel.  High-grade obstruction in the proximal and mid LAD.  High-grade obstruction in the mid circumflex.  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   Procedure was complicated by loss of venous access midway through the procedure, inability to adequately anticoagulate because of IV infiltration, and low ACT of around 1/92 after stent implantation.  Subsequently intra-arterial heparin was administered.  Closing ACT 290 seconds.  Second IV site started and a bolus and infusion of Aggrastat was started.  Also loaded with Brilinta.  Will need debate concerning definitive therapy which classically she had include coronary bypass surgery but at her young age multisite PCI there is also an option.   Patient Profile     36 y.o. female with PMH type 2 diabetes mellitus, obesity ( s/p lab band ) who presented 11/4 with weeks of crushing chest pain radiating to the left arm associated with shortness of breath and palpitations. She was found to have troponin with peak > 65, ST segment elevations in interior lead, taken for emergent cath and was found to have total occlusion of the RCA, high grade obstruction of the LAD and mid circumflex. She had angioplasty and a stent placed in the mid RCA but the procedure became  complicated by loss of venous access, inability to adequately anticoagulate dt IV infiltration, and low activated clotting time after stent implantation.   Assessment & Plan    STEMI s/p angioplasty and stent to the mid RCA with severe 3 vessel disease- CT surgery met  with the her yesterday and they will proceed with CABG. She was given aggrastat and loaded with Brilinta after PCI so the CABG will be delayed until later this week. Currently she is on aspirin, atorvastatin 80 mg qd, carvedilol 3.125 BID, subq heparin and tirofiban. Will start ACE today.   T2DM - on insulin therapy, A1c 7.4, will need continued management of this outpatient   For questions or updates, please contact [place] Please consult [email or web address] for contact info under Cardiology/STEMI.      Signed, Ledell Noss, MD  07/25/2017, 8:47 AM     Patient seen and examined. Agree with assessment and plan. No recurrent chest pain. 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 Currently on brilinta washout; being maintained on aggrastat. Check lipid panel; now on atorvastatin 80 mg; will need LDL,70 and if cannot achieve with high potency statin +- zetia then add PCSK9 inhibition.  BP on low side precluding further titration of BB or anti-ischemic meds presently. K 3.5; replete. Tentatively CABG on Friday.   Troy Sine, MD, [facility] 07/25/2017 11:08 AM

## 2017-07-25 NOTE — Progress Notes (Signed)
Pt's smart watch ([employer]) black color found at cath lab and brought in to patient  This morning.

## 2017-07-25 NOTE — Progress Notes (Signed)
ANTICOAGULATION CONSULT NOTE  Pharmacy Consult for tirofiban Indication: chest pain/ACS  No Known Allergies  Patient Measurements: Height: 5\' 3"  (160 cm) Weight: 279 lb 1.6 oz (126.6 kg) IBW/kg (Calculated) : 52.4   Vital Signs: Temp: 97.7 F (36.5 C) (11/05 0823) Temp Source: Oral (11/05 0823) BP: 116/69 (11/05 0900) Pulse Rate: 79 (11/05 0900)  Labs: Recent Labs    07/24/17 0518 07/24/17 0536 07/24/17 1105 07/24/17 1200 07/24/17 1845 07/24/17 2356 07/25/17 0407  HGB 13.5 13.9 13.8  --   --   --  11.8*  HCT 39.4 41.0 41.9  --   --   --  35.4*  PLT 209  --  217  --   --   --  199  APTT 24  --   --   --   --   --   --   LABPROT 12.6  --   --   --   --   --   --   INR 0.95  --   --   --   --   --   --   HEPARINUNFRC  --   --   --  <0.10*  --   --   --   CREATININE 0.86 0.80 0.85  --   --   --  0.84  TROPONINI  --   --  >65.00*  --  61.51* 53.55*  --     Estimated Creatinine Clearance: 118.8 mL/min (by C-G formula based on SCr of 0.84 mg/dL).   Medical History: Past Medical History:  Diagnosis Date  . Diabetes mellitus     Medications:  Scheduled:  . aspirin  81 mg Oral Daily  . atorvastatin  80 mg Oral q1800  . carvedilol  3.125 mg Oral BID WC  . heparin  5,000 Units Subcutaneous Q8H  . insulin aspart  0-15 Units Subcutaneous TID WC  . insulin aspart  3 Units Subcutaneous TID WC  . sodium chloride flush  3 mL Intravenous Q12H    Assessment: 36 yo female with STEMI s/p stenting of RCA with plans for CABG. Pharmacy consulted to dose tirofiban.  -Hg= 11.8, plt= 199, SCr= 0.89, CrCl > 100   Goal of Therapy:  Monitor platelets by anticoagulation protocol: Yes   Plan:  -Continue tirofiban at 0.83mcg/kg/min -Daily CBC for now  Hildred Laser, Pharm D 07/25/2017 9:53 AM

## 2017-07-26 ENCOUNTER — Encounter (HOSPITAL_COMMUNITY): Payer: BC Managed Care – PPO

## 2017-07-26 LAB — SURGICAL PCR SCREEN
MRSA, PCR: NEGATIVE
Staphylococcus aureus: NEGATIVE

## 2017-07-26 LAB — CBC
HCT: 34.5 % — ABNORMAL LOW (ref 36.0–46.0)
Hemoglobin: 11.5 g/dL — ABNORMAL LOW (ref 12.0–15.0)
MCH: 27.2 pg (ref 26.0–34.0)
MCHC: 33.3 g/dL (ref 30.0–36.0)
MCV: 81.6 fL (ref 78.0–100.0)
Platelets: 193 10*3/uL (ref 150–400)
RBC: 4.23 MIL/uL (ref 3.87–5.11)
RDW: 13.7 % (ref 11.5–15.5)
WBC: 9 10*3/uL (ref 4.0–10.5)

## 2017-07-26 LAB — BASIC METABOLIC PANEL
Anion gap: 4 — ABNORMAL LOW (ref 5–15)
BUN: 9 mg/dL (ref 6–20)
CO2: 24 mmol/L (ref 22–32)
Calcium: 8 mg/dL — ABNORMAL LOW (ref 8.9–10.3)
Chloride: 111 mmol/L (ref 101–111)
Creatinine, Ser: 0.79 mg/dL (ref 0.44–1.00)
GFR calc Af Amer: >60 mL/min (ref >60)
GFR calc non Af Amer: >60 mL/min (ref >60)
Glucose, Bld: 172 mg/dL — ABNORMAL HIGH (ref 65–99)
Potassium: 3.7 mmol/L (ref 3.5–5.1)
Sodium: 139 mmol/L (ref 135–145)

## 2017-07-26 LAB — LIPID PANEL
Cholesterol: 114 mg/dL (ref 0–200)
HDL: 32 mg/dL — ABNORMAL LOW (ref >40)
LDL Cholesterol: 57 mg/dL (ref 0–99)
Total CHOL/HDL Ratio: 3.6 RATIO
Triglycerides: 126 mg/dL (ref <150)
VLDL: 25 mg/dL (ref 0–40)

## 2017-07-26 LAB — GLUCOSE, CAPILLARY
Glucose-Capillary: 146 mg/dL — ABNORMAL HIGH (ref 65–99)
Glucose-Capillary: 138 mg/dL — ABNORMAL HIGH (ref 65–99)
Glucose-Capillary: 110 mg/dL — ABNORMAL HIGH (ref 65–99)

## 2017-07-26 MED ORDER — CARVEDILOL 6.25 MG PO TABS
6.2500 mg | ORAL_TABLET | Freq: Two times a day (BID) | ORAL | Status: DC
  Administered 2017-07-26 – 2017-07-27 (×4): 6.25 mg via ORAL
  Filled 2017-07-26 (×4): qty 1

## 2017-07-26 NOTE — Progress Notes (Signed)
CARDIAC REHAB PHASE I   PRE:  Rate/Rhythm: 90 SR    BP: sitting 111/84    SaO2:   MODE:  Ambulation: 540 ft   POST:  Rate/Rhythm: 90 SR    BP: sitting 129/91     SaO2:   Pt took slow walk, no c/o. Feels some tiredness but ok. She asks appropriate questions regarding surgery. Gave her [facility] book, careguide, and video to watch. Will do formal preop ed tomorrow. [id number]   [place], ACSM 07/26/2017 12:07 PM

## 2017-07-26 NOTE — Progress Notes (Signed)
Progress Note  Patient Name: [name] Date of Encounter: 07/26/2017  Primary Cardiologist: None  Subjective   Feeling well, denies any new concerns or complaints, no chest pain or difficulty breathing. Has many good questions about how to manage her heart disease and prognosis.   Inpatient Medications    Scheduled Meds: . aspirin  81 mg Oral Daily  . atorvastatin  80 mg Oral q1800  . carvedilol  3.125 mg Oral BID WC  . heparin  5,000 Units Subcutaneous Q8H  . insulin aspart  0-15 Units Subcutaneous TID WC  . insulin aspart  3 Units Subcutaneous TID WC  . sodium chloride flush  3 mL Intravenous Q12H   Continuous Infusions: . sodium chloride    . sodium chloride    . tirofiban 0.15 mcg/kg/min (07/26/17 0630)   PRN Meds: sodium chloride, acetaminophen, ALPRAZolam, ondansetron (ZOFRAN) IV, oxyCODONE, [START ON 08/03/2017] pneumococcal 23 valent vaccine, sodium chloride flush   Vital Signs    Vitals:   07/26/17 0300 07/26/17 0400 07/26/17 0500 07/26/17 0600  BP: (!) 86/53 (!) 89/51 (!) 93/55 100/63  Pulse: 87 86 79 75  Resp: (!) 21 (!) 21 20 20   Temp:  98.3 F (36.8 C)    TempSrc:  Oral    SpO2: 98% 98% 99% 98%  Weight:      Height:        Intake/Output Summary (Last 24 hours) at 07/26/2017 2505 Last data filed at 07/26/2017 0600 Gross per 24 hour  Intake 1221.6 ml  Output 650 ml  Net 571.6 ml   Filed Weights   07/24/17 0508 07/24/17 0900  Weight: 260 lb (117.9 kg) 279 lb 1.6 oz (126.6 kg)    Telemetry    Sinus rhythm - Personally Reviewed  Physical Exam   GEN: sleeping comfortably, No acute distress.   Neck: No JVD Cardiac: RRR, no murmurs, rubs, or gallops.  Respiratory: Clear to auscultation bilaterally. GI: Soft, nontender, non-distended  MS: trace edema; No deformity. Neuro:  Nonfocal  Psych: Normal affect   Labs    Chemistry Recent Labs  Lab 07/24/17 0518 07/24/17 0536 07/24/17 1105 07/25/17 0407 07/26/17 0250  NA 135 139   --  138 139  K 4.8 4.8  --  3.5 3.7  CL 103 103  --  109 111  CO2 23  --   --  23 24  GLUCOSE 256* 250*  --  159* 172*  BUN 13 13  --  9 9  CREATININE 0.86 0.80 0.85 0.84 0.79  CALCIUM 8.5*  --   --  7.9* 8.0*  PROT 6.7  --   --   --   --   ALBUMIN 3.2*  --   --   --   --   AST 26  --   --   --   --   ALT 18  --   --   --   --   ALKPHOS 65  --   --   --   --   BILITOT 0.6  --   --   --   --   GFRNONAA >60  --  >60 >60 >60  GFRAA >60  --  >60 >60 >60  ANIONGAP 9  --   --  6 4*     Hematology Recent Labs  Lab 07/24/17 1105 07/25/17 0407 07/26/17 0250  WBC 9.8 9.6 9.0  RBC 5.12* 4.35 4.23  HGB 13.8 11.8* 11.5*  HCT 41.9 35.4* 34.5*  MCV 81.8 81.4 81.6  MCH 27.0 27.1 27.2  MCHC 32.9 33.3 33.3  RDW 13.4 13.7 13.7  PLT 217 199 193    Cardiac Enzymes Recent Labs  Lab 07/24/17 1105 07/24/17 1845 07/24/17 2356  TROPONINI >65.00* 61.51* 53.55*    Recent Labs  Lab 07/24/17 0537  TROPIPOC 0.03    Lipid Panel     Component Value Date/Time   CHOL 114 07/26/2017 0250   TRIG 126 07/26/2017 0250   HDL 32 (L) 07/26/2017 0250   CHOLHDL 3.6 07/26/2017 0250   VLDL 25 07/26/2017 0250   LDLCALC 57 07/26/2017 0250    BNPNo results for input(s): BNP, PROBNP in the last 168 hours.   DDimer No results for input(s): DDIMER in the last 168 hours.   Radiology    Dg Chest Port 1 View  Result Date: 07/25/2017 CLINICAL DATA:  Congestive heart failure. EXAM: PORTABLE CHEST 1 VIEW COMPARISON:  Radiograph of July 24, 2017. FINDINGS: The heart size and mediastinal contours are within normal limits. Both lungs are clear. No pneumothorax or pleural effusion is noted. The visualized skeletal structures are unremarkable. IMPRESSION: No acute cardiopulmonary abnormality seen. Electronically Signed   By: Marijo Conception, M.D.   On: 07/25/2017 07:55    Cardiac Studies    Catheterization 60/81   36 year old with strong family history of CAD, prior diabetes in her early 37s, morbid  obesity, status post bariatric surgery with recurrent weight gain. Presented with chest discomfort that awakened her from sleep and in the emergency department had ST elevation in the inferior leads compatible with inferior STEMI.  RCA totally occluded in the mid vessel. High-grade obstruction in the proximal and mid LAD. High-grade obstruction in the mid circumflex.  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   Procedure was complicated byloss of venous access midway through the procedure, inability to adequately anticoagulate because of IV infiltration, and low ACT of around 1/92 after stent implantation. Subsequently intra-arterial heparin was administered. Closing ACT 290 seconds. Second IV site started and a bolus and infusion of Aggrastat was started. Also loaded with Brilinta.  Will need debate concerning definitive therapy which classically she had include coronary bypass surgery but at her young age multisite PCI there is also an option.    Patient Profile     36 y.o. female type 2 diabetes mellitus, obesity ( s/p lab band ) who presented 11/4 with weeks of crushing chest pain radiating to the left arm associated with shortness of breath and palpitations. She was found to have troponin with peak > 65, ST segment elevations in inferior lead, taken for emergent cath and was found to have total occlusion of the RCA, high grade obstruction of the LAD and circumflex. She had angioplasty and a stent placed in the mid RCA but the procedure became complicated by loss of venous access. She was evaluated by CT surgery and should go for CABG later this week after Brilinta washout   Assessment & Plan    STEMI s/p angioplasty and stent to the RCA with residual multivessel disease in need of CABG after Brilinta washout. Managing with aggrastat and atorvastatin 80 mg qd. BP remains low ( SBP 80-100s ), will need  to be cautious with increasing coreg and hold off on starting ACE.   T2 DM - on insulin therapy   Morbid obesity - will need continued support and encouragement   For questions or  updates, please contact [place] Please consult [email or web address] for contact info under Cardiology/STEMI.      Signed, Ledell Noss, MD  07/26/2017, 7:12 AM     Patient seen and examined. Agree with assessment and plan. No recurrent anginal symptoms. Received brilinta load on 11/4; currently on brilinta washout; awaiting CABG. Continues on aggrastat for G2b3a  platelet inhibition. Not well beta blocked; will try to titrate coreg to 6.25 as BP allows. Echo reviewed EF normal without apparent WMA.  Had discussion concerning optimal lifestyle adjustment post ACS.  LDL now 57 on atorvastatin. I/O + 165. Will increase fluids to allow for greater BP and med adjustment. Cr stable at 0.79 post contrast.   Troy Sine, MD, [facility] 07/26/2017 8:21 AM

## 2017-07-26 NOTE — Progress Notes (Signed)
PT Cancellation Note  Patient Details Name: TASHEKA HOUSEMAN MRN: [id number] DOB: 1980/10/30   Cancelled Treatment:    Reason Eval/Treat Not Completed: PT screened, no needs identified, will sign off. Pt being seen for pre-op CABG education by cardiac rehab.    Shary Decamp [place] 07/26/2017, 1:35 PM [place]

## 2017-07-26 NOTE — Progress Notes (Signed)
ANTICOAGULATION CONSULT NOTE  Pharmacy Consult for tirofiban Indication: chest pain/ACS  No Known Allergies  Patient Measurements: Height: 5\' 3"  (160 cm) Weight: 279 lb 1.6 oz (126.6 kg) IBW/kg (Calculated) : 52.4   Vital Signs: Temp: 98.3 F (36.8 C) (11/06 0400) Temp Source: Oral (11/06 0400) BP: 100/63 (11/06 0600) Pulse Rate: 72 (11/06 0700)  Labs: Recent Labs    07/24/17 0518  07/24/17 1105 07/24/17 1200 07/24/17 1845 07/24/17 2356 07/25/17 0407 07/26/17 0250  HGB 13.5   < > 13.8  --   --   --  11.8* 11.5*  HCT 39.4   < > 41.9  --   --   --  35.4* 34.5*  PLT 209  --  217  --   --   --  199 193  APTT 24  --   --   --   --   --   --   --   LABPROT 12.6  --   --   --   --   --   --   --   INR 0.95  --   --   --   --   --   --   --   HEPARINUNFRC  --   --   --  <0.10*  --   --   --   --   CREATININE 0.86   < > 0.85  --   --   --  0.84 0.79  TROPONINI  --   --  >65.00*  --  61.51* 53.55*  --   --    < > = values in this interval not displayed.    Estimated Creatinine Clearance: 124.8 mL/min (by C-G formula based on SCr of 0.79 mg/dL).   Medical History: Past Medical History:  Diagnosis Date  . Diabetes mellitus     Medications:  Scheduled:  . aspirin  81 mg Oral Daily  . atorvastatin  80 mg Oral q1800  . carvedilol  3.125 mg Oral BID WC  . heparin  5,000 Units Subcutaneous Q8H  . insulin aspart  0-15 Units Subcutaneous TID WC  . insulin aspart  3 Units Subcutaneous TID WC  . sodium chloride flush  3 mL Intravenous Q12H    Assessment: 36 yo female with STEMI s/p stenting of RCA with plans for CABG. Pharmacy consulted to dose tirofiban.  -Hg= 11.5, plt= 199, SCr= 0.79, CrCl > 100   Goal of Therapy:  Monitor platelets by anticoagulation protocol: Yes   Plan:  -Continue tirofiban at 0.86mcg/kg/min -Daily CBC for now  Hildred Laser, Pharm D 07/26/2017 8:09 AM

## 2017-07-27 ENCOUNTER — Encounter (HOSPITAL_COMMUNITY): Payer: Self-pay | Admitting: Interventional Cardiology

## 2017-07-27 LAB — GLUCOSE, CAPILLARY
Glucose-Capillary: 104 mg/dL — ABNORMAL HIGH (ref 65–99)
Glucose-Capillary: 130 mg/dL — ABNORMAL HIGH (ref 65–99)
Glucose-Capillary: 134 mg/dL — ABNORMAL HIGH (ref 65–99)
Glucose-Capillary: 143 mg/dL — ABNORMAL HIGH (ref 65–99)
Glucose-Capillary: 120 mg/dL — ABNORMAL HIGH (ref 65–99)

## 2017-07-27 LAB — CBC
HCT: 36.2 % (ref 36.0–46.0)
Hemoglobin: 11.8 g/dL — ABNORMAL LOW (ref 12.0–15.0)
MCH: 26.7 pg (ref 26.0–34.0)
MCHC: 32.6 g/dL (ref 30.0–36.0)
MCV: 81.9 fL (ref 78.0–100.0)
Platelets: 215 10*3/uL (ref 150–400)
RBC: 4.42 MIL/uL (ref 3.87–5.11)
RDW: 13.7 % (ref 11.5–15.5)
WBC: 8.1 10*3/uL (ref 4.0–10.5)

## 2017-07-27 MED ORDER — LIVING WELL WITH DIABETES BOOK
Freq: Once | Status: DC
  Filled 2017-07-27: qty 1

## 2017-07-27 MED ORDER — NITROGLYCERIN IN D5W 200-5 MCG/ML-% IV SOLN
INTRAVENOUS | Status: AC
  Filled 2017-07-27: qty 250

## 2017-07-27 MED ORDER — NITROGLYCERIN 0.4 MG SL SUBL
0.4000 mg | SUBLINGUAL_TABLET | SUBLINGUAL | Status: DC | PRN
  Filled 2017-07-27: qty 1

## 2017-07-27 MED ORDER — NITROGLYCERIN IN D5W 200-5 MCG/ML-% IV SOLN
0.0000 ug/min | INTRAVENOUS | Status: DC
  Administered 2017-07-27: 10 ug/min via INTRAVENOUS

## 2017-07-27 NOTE — Progress Notes (Addendum)
Progress Note  Patient Name: [name] Date of Encounter: 07/27/2017  Primary Cardiologist: none   Subjective   Developed some chest pressure overnight and continues to have that today, pressure is non radiating and not associated with shortness of breath or diaphoresis.   Inpatient Medications    Scheduled Meds: . aspirin  81 mg Oral Daily  . atorvastatin  80 mg Oral q1800  . carvedilol  6.25 mg Oral BID WC  . heparin  5,000 Units Subcutaneous Q8H  . insulin aspart  0-15 Units Subcutaneous TID WC  . insulin aspart  3 Units Subcutaneous TID WC  . sodium chloride flush  3 mL Intravenous Q12H   Continuous Infusions: . sodium chloride 50 mL/hr at 07/27/17 0525  . sodium chloride    . tirofiban 0.15 mcg/kg/min (07/27/17 0526)   PRN Meds: sodium chloride, acetaminophen, ALPRAZolam, ondansetron (ZOFRAN) IV, oxyCODONE, [START ON 08/03/2017] pneumococcal 23 valent vaccine, sodium chloride flush   Vital Signs    Vitals:   07/27/17 0000 07/27/17 0015 07/27/17 0327 07/27/17 0400  BP: (!) 89/57 (!) 89/57    Pulse: 86   82  Resp: (!) 22 (!) 22  20  Temp: 98.3 F (36.8 C)  98.2 F (36.8 C) 98.2 F (36.8 C)  TempSrc: Oral  Oral Oral  SpO2: 100%   100%  Weight:      Height:        Intake/Output Summary (Last 24 hours) at 07/27/2017 0657 Last data filed at 07/27/2017 0400 Gross per 24 hour  Intake 2164.4 ml  Output -  Net 2164.4 ml   Filed Weights   07/24/17 0508 07/24/17 0900  Weight: 260 lb (117.9 kg) 279 lb 1.6 oz (126.6 kg)    Telemetry    Sinus rhythm, rate 80s - Personally Reviewed   Physical Exam   GEN: Lying in bed speaking on her phone, No acute distress.   Neck: No JVD Cardiac: RRR, no murmurs, rubs, or gallops.  Respiratory: Clear to auscultation bilaterally. GI: Soft, nontender, non-distended  MS: trace edema; No deformity. Neuro:  Nonfocal  Psych: Normal affect   Labs    Chemistry Recent Labs  Lab 07/24/17 0518 07/24/17 0536  07/24/17 1105 07/25/17 0407 07/26/17 0250  NA 135 139  --  138 139  K 4.8 4.8  --  3.5 3.7  CL 103 103  --  109 111  CO2 23  --   --  23 24  GLUCOSE 256* 250*  --  159* 172*  BUN 13 13  --  9 9  CREATININE 0.86 0.80 0.85 0.84 0.79  CALCIUM 8.5*  --   --  7.9* 8.0*  PROT 6.7  --   --   --   --   ALBUMIN 3.2*  --   --   --   --   AST 26  --   --   --   --   ALT 18  --   --   --   --   ALKPHOS 65  --   --   --   --   BILITOT 0.6  --   --   --   --   GFRNONAA >60  --  >60 >60 >60  GFRAA >60  --  >60 >60 >60  ANIONGAP 9  --   --  6 4*     Hematology Recent Labs  Lab 07/25/17 0407 07/26/17 0250 07/27/17 0438  WBC 9.6 9.0 8.1  RBC  4.35 4.23 4.42  HGB 11.8* 11.5* 11.8*  HCT 35.4* 34.5* 36.2  MCV 81.4 81.6 81.9  MCH 27.1 27.2 26.7  MCHC 33.3 33.3 32.6  RDW 13.7 13.7 13.7  PLT 199 193 215    Cardiac Enzymes Recent Labs  Lab 07/24/17 1105 07/24/17 1845 07/24/17 2356  TROPONINI >65.00* 61.51* 53.55*    Recent Labs  Lab 07/24/17 0537  TROPIPOC 0.03     BNPNo results for input(s): BNP, PROBNP in the last 168 hours.   DDimer No results for input(s): DDIMER in the last 168 hours.   Radiology    No results found.  Cardiac Studies   Catheterization 99/66  36 year old with strong family history of CAD, prior diabetes in her early 68s, morbid obesity, status post bariatric surgery with recurrent weight gain. Presented with chest discomfort that awakened her from sleep and in the emergency department had ST elevation in the inferior leads compatible with inferior STEMI.  RCA totally occluded in the mid vessel. High-grade obstruction in the proximal and mid LAD. High-grade obstruction in the mid circumflex.  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   Procedure was complicated byloss of venous access midway through the procedure, inability to adequately anticoagulate because of  IV infiltration, and low ACT of around 1/92 after stent implantation. Subsequently intra-arterial heparin was administered. Closing ACT 290 seconds. Second IV site started and a bolus and infusion of Aggrastat was started. Also loaded with Brilinta.  Will need debate concerning definitive therapy which classically she had include coronary bypass surgery but at her young age multisite PCI there is also an option.    Patient Profile     36 y.o. female type 2 diabetes mellitus, obesity ( s/p lab band ) who presented 11/4 withweeks ofcrushing chest pain radiating to the left arm associated with shortness of breath and palpitations. She was found to have troponin with peak > 65,ST segment elevations in inferior lead, taken for emergent cath and was found to have total occlusion of the RCA, high grade obstruction of the LAD and circumflex. She had angioplasty and a stent placed in the mid RCA but the procedure became complicated by loss of venous access. She was evaluated by CT surgery and should go for CABG later this week after Brilinta washout   Assessment & Plan    STEMI s/p angioplasty and stent to the RCA with residual multivessel disease in need of CABG after Brilinta washout. Last dose of Brilinta was 11/4, she has been on tirofiban since then. Blood pressure still low today will continue to hold off on starting ACE or nitrite. Coreg was increased yesterday, heart rate is still in the 80s so may tolerate further increase. Continue Atorvastatin 80 mg qd. Developed chest tightness overnight - will obtain an EKG and start nitro gtt.   T2DM - on insulin therapy with good control of blood glucose   Morbid obesity - met with cardiac rehab yesterday   For questions or updates, please contact [place] Please consult [email or web address] for contact info under Cardiology/STEMI.      Signed, Ledell Noss, MD  07/27/2017, 6:57 AM     Patient seen and examined. Agree with assessment and plan. Pt  experienced mild chest tightness last evening less intense but of similar characteristic as presentation. Now pain free. BP earlier was low during evening.; currently 126/80, P 80. No JVD; lungs clear, RRR faint 1/6 sem; no s3  gallop, BS+; no edema.   Prior ECG showed evolutionary inferior T wave inversion from prior ST elevation. ECG ordered for this am shows (independently reviewed): NSR at 73, inferior T wave inversion; Q wave III, aVF. No new ST changes.  Will increase fluids to 75 cc/hr and start iv NTG. Has only received 2 doses of 6.25 carvedilol, not yet steady state. She had only received the load of brillinta 180 mg on 11/4; on washout with aggrastat. If recurrent chest pain will add heparin. Will keep in ICU; CABG possibly Friday.   Troy Sine, MD, [facility] 07/27/2017 8:03 AM

## 2017-07-27 NOTE — Progress Notes (Signed)
CARDIAC REHAB PHASE I   PRE:  Rate/Rhythm: 73 SR  BP:  Sitting: 131/88      SaO2: 99% RA  MODE:  Ambulation: 295 ft   POST:  Rate/Rhythm: 82 SR  BP:  Sitting: 123/80      SaO2: 100% RA  Pt ambulated 295 ft independently with very slow steady gait. Pre-Op education completed with aunt at beside. Reviewed cardiac surgery book and guidelines, sternal precautions, IS, restrictions and exercise progression. Pt is anxious about surgery. Provided emotional support. Left instructions to view videos. Pt verbalized understanding of education. Pt returned to bed per pt request. Call bell within reach. Aunt and mom at bedside.   [phone]    Carma Lair MS, ACSM CEP  2:42 PM 07/27/2017

## 2017-07-28 ENCOUNTER — Inpatient Hospital Stay (HOSPITAL_COMMUNITY): Payer: BC Managed Care – PPO

## 2017-07-28 DIAGNOSIS — Z01810 Encounter for preprocedural cardiovascular examination: Secondary | ICD-10-CM

## 2017-07-28 DIAGNOSIS — I200 Unstable angina: Secondary | ICD-10-CM

## 2017-07-28 LAB — CBC
HCT: 34.7 % — ABNORMAL LOW (ref 36.0–46.0)
Hemoglobin: 11.4 g/dL — ABNORMAL LOW (ref 12.0–15.0)
MCH: 27 pg (ref 26.0–34.0)
MCHC: 32.9 g/dL (ref 30.0–36.0)
MCV: 82 fL (ref 78.0–100.0)
Platelets: 215 10*3/uL (ref 150–400)
RBC: 4.23 MIL/uL (ref 3.87–5.11)
RDW: 13.7 % (ref 11.5–15.5)
WBC: 7.3 10*3/uL (ref 4.0–10.5)

## 2017-07-28 LAB — GLUCOSE, CAPILLARY
Glucose-Capillary: 126 mg/dL — ABNORMAL HIGH (ref 65–99)
Glucose-Capillary: 135 mg/dL — ABNORMAL HIGH (ref 65–99)
Glucose-Capillary: 145 mg/dL — ABNORMAL HIGH (ref 65–99)
Glucose-Capillary: 150 mg/dL — ABNORMAL HIGH (ref 65–99)

## 2017-07-28 LAB — BASIC METABOLIC PANEL
Anion gap: 6 (ref 5–15)
BUN: 10 mg/dL (ref 6–20)
CO2: 24 mmol/L (ref 22–32)
Calcium: 8.4 mg/dL — ABNORMAL LOW (ref 8.9–10.3)
Chloride: 108 mmol/L (ref 101–111)
Creatinine, Ser: 0.72 mg/dL (ref 0.44–1.00)
GFR calc Af Amer: >60 mL/min (ref >60)
GFR calc non Af Amer: >60 mL/min (ref >60)
Glucose, Bld: 127 mg/dL — ABNORMAL HIGH (ref 65–99)
Potassium: 3.9 mmol/L (ref 3.5–5.1)
Sodium: 138 mmol/L (ref 135–145)

## 2017-07-28 MED ORDER — ISOSORBIDE MONONITRATE ER 30 MG PO TB24
30.0000 mg | ORAL_TABLET | Freq: Every day | ORAL | Status: DC
  Administered 2017-07-28 – 2017-08-01 (×5): 30 mg via ORAL
  Filled 2017-07-28 (×5): qty 1

## 2017-07-28 MED ORDER — POLYETHYLENE GLYCOL 3350 17 G PO PACK: 17.0000 g | PACK | Freq: Every day | ORAL | Status: DC | PRN

## 2017-07-28 MED ORDER — CARVEDILOL 3.125 MG PO TABS: 9.3750 mg | ORAL_TABLET | Freq: Two times a day (BID) | ORAL | Status: DC

## 2017-07-28 MED ORDER — CARVEDILOL 6.25 MG PO TABS
9.3750 mg | ORAL_TABLET | Freq: Two times a day (BID) | ORAL | Status: DC
  Administered 2017-07-28 (×2): 9.375 mg via ORAL
  Filled 2017-07-28 (×2): qty 1

## 2017-07-28 MED ORDER — CARVEDILOL 12.5 MG PO TABS: 12.5000 mg | ORAL_TABLET | Freq: Two times a day (BID) | ORAL | Status: DC

## 2017-07-28 NOTE — Progress Notes (Signed)
CARDIAC REHAB PHASE I   PRE:  Rate/Rhythm: 90 SR  BP:  Sitting: 124/87      SaO2: 99% RA  MODE:  Ambulation: 370 ft   POST:  Rate/Rhythm: 96 SR  BP:  Sitting: 129/79      SaO2: 99% RA  Pt ambulated 370 ft with steady gait. Pt was mildly SOB. Pt stopped for one 10 second rest break. Pt denied complaints of CP. Pt stated "she feels better while walking than yesterday." Pt practiced sitting and standing without using her arms in preparation for surgery next week. Pt returned to bed in preparation for bedside procedure. Call bell within reach.   [phone]  Carma Lair MS, ACSM CEP  10:24 AM 07/28/2017

## 2017-07-28 NOTE — Progress Notes (Signed)
Progress Note  Patient Name: [name] Date of Encounter: 07/28/2017  Primary Cardiologist: none   Subjective   Reports complete resolution of her chest pain since starting the nitroglycerine gtt. No new concerns today. She is wondering when her CABG will be, wants to have family present.   Inpatient Medications    Scheduled Meds: . aspirin  81 mg Oral Daily  . atorvastatin  80 mg Oral q1800  . carvedilol  6.25 mg Oral BID WC  . heparin  5,000 Units Subcutaneous Q8H  . insulin aspart  0-15 Units Subcutaneous TID WC  . insulin aspart  3 Units Subcutaneous TID WC  . living well with diabetes book   Does not apply Once  . sodium chloride flush  3 mL Intravenous Q12H   Continuous Infusions: . sodium chloride 75 mL/hr at 07/27/17 2357  . sodium chloride    . nitroGLYCERIN 10 mcg/min (07/27/17 1100)  . tirofiban 0.15 mcg/kg/min (07/28/17 0428)   PRN Meds: sodium chloride, acetaminophen, ALPRAZolam, ondansetron (ZOFRAN) IV, oxyCODONE, [START ON 08/03/2017] pneumococcal 23 valent vaccine, sodium chloride flush   Vital Signs    Vitals:   07/28/17 0400 07/28/17 0500 07/28/17 0600 07/28/17 0647  BP: 134/73 126/86 111/77   Pulse: 66 74 73   Resp: (!) 22 19 18 20   Temp:      TempSrc:      SpO2: 99% 98% 98%   Weight:    280 lb 3.3 oz (127.1 kg)  Height:        Intake/Output Summary (Last 24 hours) at 07/28/2017 0648 Last data filed at 07/28/2017 0600 Gross per 24 hour  Intake 1636.75 ml  Output 900 ml  Net 736.75 ml   Filed Weights   07/24/17 0508 07/24/17 0900 07/28/17 0647  Weight: 260 lb (117.9 kg) 279 lb 1.6 oz (126.6 kg) 280 lb 3.3 oz (127.1 kg)    Telemetry    Sinus rhythm - Personally Reviewed  ECG     sinus, q waves and inverted t waves in inferior leads, flattening of T waves in lateral leads- Personally Reviewed  Physical Exam   GEN: sitting up in a chair watching TV, No acute distress.   Neck: No JVD Cardiac: RRR, no murmurs, rubs, or  gallops.  Respiratory: Clear to auscultation bilaterally. GI: Soft, nontender, non-distended  MSK: trace peripheral edema; No deformity. Neuro:  Nonfocal  Psych: Normal affect   Labs    Chemistry Recent Labs  Lab 07/24/17 0518  07/25/17 0407 07/26/17 0250 07/28/17 0402  NA 135   < > 138 139 138  K 4.8   < > 3.5 3.7 3.9  CL 103   < > 109 111 108  CO2 23  --  23 24 24   GLUCOSE 256*   < > 159* 172* 127*  BUN 13   < > 9 9 10   CREATININE 0.86   < > 0.84 0.79 0.72  CALCIUM 8.5*  --  7.9* 8.0* 8.4*  PROT 6.7  --   --   --   --   ALBUMIN 3.2*  --   --   --   --   AST 26  --   --   --   --   ALT 18  --   --   --   --   ALKPHOS 65  --   --   --   --   BILITOT 0.6  --   --   --   --  GFRNONAA >60   < > >60 >60 >60  GFRAA >60   < > >60 >60 >60  ANIONGAP 9  --  6 4* 6   < > = values in this interval not displayed.     Hematology Recent Labs  Lab 07/26/17 0250 07/27/17 0438 07/28/17 0402  WBC 9.0 8.1 7.3  RBC 4.23 4.42 4.23  HGB 11.5* 11.8* 11.4*  HCT 34.5* 36.2 34.7*  MCV 81.6 81.9 82.0  MCH 27.2 26.7 27.0  MCHC 33.3 32.6 32.9  RDW 13.7 13.7 13.7  PLT 193 215 215    Cardiac Enzymes Recent Labs  Lab 07/24/17 1105 07/24/17 1845 07/24/17 2356  TROPONINI >65.00* 61.51* 53.55*    Recent Labs  Lab 07/24/17 0537  TROPIPOC 0.03    Lipid Panel     Component Value Date/Time   CHOL 114 07/26/2017 0250   TRIG 126 07/26/2017 0250   HDL 32 (L) 07/26/2017 0250   CHOLHDL 3.6 07/26/2017 0250   VLDL 25 07/26/2017 0250   LDLCALC 57 07/26/2017 0250    BNPNo results for input(s): BNP, PROBNP in the last 168 hours.   DDimer No results for input(s): DDIMER in the last 168 hours.   Radiology    No results found.  Cardiac Studies   Catheterization 9/64  36 year old with strong family history of CAD, prior diabetes in her early 24s, morbid obesity, status post bariatric surgery with recurrent weight gain. Presented with chest discomfort that awakened her from  sleep and in the emergency department had ST elevation in the inferior leads compatible with inferior STEMI.  RCA totally occluded in the mid vessel. High-grade obstruction in the proximal and mid LAD. High-grade obstruction in the mid circumflex.  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   Procedure was complicated byloss of venous access midway through the procedure, inability to adequately anticoagulate because of IV infiltration, and low ACT of around 1/92 after stent implantation. Subsequently intra-arterial heparin was administered. Closing ACT 290 seconds. Second IV site started and a bolus and infusion of Aggrastat was started. Also loaded with Brilinta.  Will need debate concerning definitive therapy which classically she had include coronary bypass surgery but at her young age multisite PCI there is also an option.  Patient Profile     36 y.o. female type 2 diabetes mellitus, obesity ( s/p lab band ) who presented 11/4 withweeks ofcrushing chest pain radiating to the left arm associated with shortness of breath and palpitations. She was found to have troponin with peak > 65,ST segment elevations in inferior lead, taken for emergent cath 11/4 and was found to have total occlusion of the RCA, high grade obstruction of the LAD and circumflex. She had angioplasty and a stent placed in the mid RCA but the procedure became complicated by loss of venous access. She was evaluated by CT surgery and should go for CABG later this week after Brilinta washout  Assessment & Plan    STEMI s/p angioplasty and stent to the RCA with residual multivessel disease in need of CABG after brilinta washout. Was having chest pain yesterday, started on nitro gtt and EKG showed residual changes with q waves and inverted T waves in inferior leads with less depth than prior. Medical management is with aspirin, tirofiban,  atorvastatin, and carvedilol 6.25 mg qd. BP may not support the addition of ACE or nitrate. Cardiac rehab initiated yesterday.   Type 2 diabetes  mellitus - on insulin sliding scale with good control of blood glucose   Morbid obesity- phase 1 of cardiac rehab initiated yesterday  For questions or updates, please contact [place] Please consult [email or web address] for contact info under Cardiology/STEMI.      Signed, Ledell Noss, MD  07/28/2017, 6:48 AM    Patient seen and examined. Agree with assessment and plan. Chest pain resolved with low dose iv NTG at 10 ug. BP better today with hydration and fluids. Pt is on surgical schedule for CABG on 08/02/17, unless can be done sooner; received brilinta load on 11/4, on washout with aggrastat for continued anti-platelet benefit. Will add imdur 30 mg this am and several hrs later will dc iv ntg; can titrate imdur to 60 mg tomorrow if needed.  HR now 70 - 80s.  No JVD, no rales, RRR no s3, abd soft, no edema.  ECG today reviewed by me shows NSR at 75 with Q wave 3, avF, and mild T wave inversion.  Will decrease fluids to 50 cc/h. Will increase coreg to 9.375 mg bid. LDL 57 on atorvastatin.    Troy Sine, MD, [facility] 07/28/2017 8:06 AM

## 2017-07-28 NOTE — Progress Notes (Signed)
Right Lower Extremity Vein Map    Right Great Saphenous Vein   Segment Diameter Comment  1. Origin 4.73mm   2. High Thigh 4.13mm   3. Mid Thigh 3.54mm   4. Low Thigh 2.42mm   5. At Knee 30mm   6. High Calf 2.79mm branching  7. Low Calf 69mm   8. Ankle 2.50mm branching   mm    mm    mm      Left Lower Extremity Vein Map    Left Great Saphenous Vein   Segment Diameter Comment  1. Origin 5.37mm   2. High Thigh 3.21mm   3. Mid Thigh 2.46mm   4. Low Thigh 4.49mm   5. At Knee 3.15mm   6. High Calf 3.77mm   7. Low Calf 2.66mm branching  8. Ankle 2.72mm branching   mm    mm    mm

## 2017-07-28 NOTE — Progress Notes (Signed)
Pre-op Cardiac Surgery  Carotid Findings:  Carotid duplex is within normal limits bilaterally at rest. Vertebral Artery flow is antegrade bilaterally  Upper Extremity Right Left  Brachial Pressures 130 Triphasic 140 Triphasic  Radial Waveforms Triphasic Triphasic  Ulnar Waveforms Triphasic Triphasic  Palmar Arch (Allen's Test) Normal Normal   Findings:  Doppler waveforms remained normal bilaterally with both radial and ulnar compressions.    Lower  Extremity Right Left  Dorsalis Pedis 141 Triphasic 138 Triphasic  Posterior Tibial 138 Triphasic 140 Triphasic  Ankle/Brachial Indices 1.01 1.0    Findings:  ABIs and Doppler waveforms indicate normal arterial flow bilaterally at rest.  [facility], [place] 07/28/2017 Sharion Dove, [place] 07/28/2017

## 2017-07-28 NOTE — Progress Notes (Addendum)
Patient ID: [name], female   DOB: 08-Feb-1981, 36 y.o.   MRN: [id number]      [address]       [place] [address]             [phone]      Patient stable, no chest pain Waiting for Brilinta  wash out -7 days  Plan cabg Tuesday currently on Aggrastat so p2y12 testing not helpful  Patient has bilateral varicose veins -  Had cut down on right saphenous vein at ankle for IV access as an infant Will check vein mapping  Patient  right handed - check palmer arch left hand   Grace Isaac MD      [address] [place] [address] Office [phone]   [place]

## 2017-07-28 NOTE — Plan of Care (Signed)
Progressing, tentative surgery date Tuesday. Off NTG, on aggrastat

## 2017-07-29 LAB — BASIC METABOLIC PANEL
Anion gap: 6 (ref 5–15)
BUN: 10 mg/dL (ref 6–20)
CO2: 24 mmol/L (ref 22–32)
Calcium: 8.5 mg/dL — ABNORMAL LOW (ref 8.9–10.3)
Chloride: 109 mmol/L (ref 101–111)
Creatinine, Ser: 0.8 mg/dL (ref 0.44–1.00)
GFR calc Af Amer: >60 mL/min (ref >60)
GFR calc non Af Amer: >60 mL/min (ref >60)
Glucose, Bld: 143 mg/dL — ABNORMAL HIGH (ref 65–99)
Potassium: 3.8 mmol/L (ref 3.5–5.1)
Sodium: 139 mmol/L (ref 135–145)

## 2017-07-29 LAB — CBC
HCT: 33.7 % — ABNORMAL LOW (ref 36.0–46.0)
Hemoglobin: 11.2 g/dL — ABNORMAL LOW (ref 12.0–15.0)
MCH: 27.1 pg (ref 26.0–34.0)
MCHC: 33.2 g/dL (ref 30.0–36.0)
MCV: 81.6 fL (ref 78.0–100.0)
Platelets: 207 10*3/uL (ref 150–400)
RBC: 4.13 MIL/uL (ref 3.87–5.11)
RDW: 13.5 % (ref 11.5–15.5)
WBC: 7.5 10*3/uL (ref 4.0–10.5)

## 2017-07-29 LAB — GLUCOSE, CAPILLARY
Glucose-Capillary: 141 mg/dL — ABNORMAL HIGH (ref 65–99)
Glucose-Capillary: 125 mg/dL — ABNORMAL HIGH (ref 65–99)
Glucose-Capillary: 133 mg/dL — ABNORMAL HIGH (ref 65–99)
Glucose-Capillary: 103 mg/dL — ABNORMAL HIGH (ref 65–99)

## 2017-07-29 MED ORDER — COLCHICINE 0.6 MG PO TABS
0.6000 mg | ORAL_TABLET | Freq: Every day | ORAL | Status: DC
  Administered 2017-07-29 – 2017-07-30 (×2): 0.6 mg via ORAL
  Filled 2017-07-29 (×2): qty 1

## 2017-07-29 MED ORDER — CARVEDILOL 12.5 MG PO TABS
12.5000 mg | ORAL_TABLET | Freq: Two times a day (BID) | ORAL | Status: DC
  Administered 2017-07-29 – 2017-08-01 (×8): 12.5 mg via ORAL
  Filled 2017-07-29 (×9): qty 1

## 2017-07-29 NOTE — Progress Notes (Signed)
Progress Note  Patient Name: [name] Date of Encounter: 07/29/2017  Primary Cardiologist: none   Subjective   Reports no chest pain or shortness of breath. Feels well. Tolerated walk around the unit yesterday. Slight pain in her left great toe, questions if this is the early signs of gout flair.   Inpatient Medications    Scheduled Meds: . aspirin  81 mg Oral Daily  . atorvastatin  80 mg Oral q1800  . carvedilol  9.375 mg Oral BID WC  . heparin  5,000 Units Subcutaneous Q8H  . insulin aspart  0-15 Units Subcutaneous TID WC  . insulin aspart  3 Units Subcutaneous TID WC  . isosorbide mononitrate  30 mg Oral Daily  . living well with diabetes book   Does not apply Once  . sodium chloride flush  3 mL Intravenous Q12H   Continuous Infusions: . sodium chloride 50 mL/hr at 07/28/17 2259  . sodium chloride    . tirofiban 0.15 mcg/kg/min (07/29/17 0627)   PRN Meds: sodium chloride, acetaminophen, ALPRAZolam, ondansetron (ZOFRAN) IV, oxyCODONE, [START ON 08/03/2017] pneumococcal 23 valent vaccine, polyethylene glycol, sodium chloride flush   Vital Signs    Vitals:   07/29/17 0333 07/29/17 0400 07/29/17 0500 07/29/17 0600  BP:  104/70 107/73   Pulse:      Resp:  19 18 (!) 21  Temp: (!) 97.4 F (36.3 C)     TempSrc: Oral     SpO2:  96%    Weight:    281 lb 1.4 oz (127.5 kg)  Height:        Intake/Output Summary (Last 24 hours) at 07/29/2017 0705 Last data filed at 07/29/2017 0400 Gross per 24 hour  Intake 1958.27 ml  Output 1800 ml  Net 158.27 ml   Filed Weights   07/24/17 0900 07/28/17 0647 07/29/17 0600  Weight: 279 lb 1.6 oz (126.6 kg) 280 lb 3.3 oz (127.1 kg) 281 lb 1.4 oz (127.5 kg)    Telemetry    Sinus rhythm - Personally Reviewed  Physical Exam   GEN: Sitting up in a chair watching television, No acute distress.   Neck: No JVD Cardiac: RRR, no murmurs, rubs, or gallops.  Respiratory: Clear to auscultation bilaterally. GI: Soft, nontender,  non-distended  MS: trace edema; No deformit, left great toe non tender, non erythematous, non swollen  Neuro:  Nonfocal  Psych: Normal affect   Labs    Chemistry Recent Labs  Lab 07/24/17 0518  07/25/17 0407 07/26/17 0250 07/28/17 0402  NA 135   < > 138 139 138  K 4.8   < > 3.5 3.7 3.9  CL 103   < > 109 111 108  CO2 23  --  23 24 24   GLUCOSE 256*   < > 159* 172* 127*  BUN 13   < > 9 9 10   CREATININE 0.86   < > 0.84 0.79 0.72  CALCIUM 8.5*  --  7.9* 8.0* 8.4*  PROT 6.7  --   --   --   --   ALBUMIN 3.2*  --   --   --   --   AST 26  --   --   --   --   ALT 18  --   --   --   --   ALKPHOS 65  --   --   --   --   BILITOT 0.6  --   --   --   --  GFRNONAA >60   < > >60 >60 >60  GFRAA >60   < > >60 >60 >60  ANIONGAP 9  --  6 4* 6   < > = values in this interval not displayed.     Hematology Recent Labs  Lab 07/27/17 0438 07/28/17 0402 07/29/17 0219  WBC 8.1 7.3 7.5  RBC 4.42 4.23 4.13  HGB 11.8* 11.4* 11.2*  HCT 36.2 34.7* 33.7*  MCV 81.9 82.0 81.6  MCH 26.7 27.0 27.1  MCHC 32.6 32.9 33.2  RDW 13.7 13.7 13.5  PLT 215 215 207    Cardiac Enzymes Recent Labs  Lab 07/24/17 1105 07/24/17 1845 07/24/17 2356  TROPONINI >65.00* 61.51* 53.55*    Recent Labs  Lab 07/24/17 0537  TROPIPOC 0.03     BNPNo results for input(s): BNP, PROBNP in the last 168 hours.   DDimer No results for input(s): DDIMER in the last 168 hours.   Radiology    No results found.  Cardiac Studies   Catheterization 62/75  36 year old with strong family history of CAD, prior diabetes in her early 49s, morbid obesity, status post bariatric surgery with recurrent weight gain. Presented with chest discomfort that awakened her from sleep and in the emergency department had ST elevation in the inferior leads compatible with inferior STEMI.  RCA totally occluded in the mid vessel. High-grade obstruction in the proximal and mid LAD. High-grade obstruction in the mid  circumflex.  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   Procedure was complicated byloss of venous access midway through the procedure, inability to adequately anticoagulate because of IV infiltration, and low ACT of around 1/92 after stent implantation. Subsequently intra-arterial heparin was administered. Closing ACT 290 seconds. Second IV site started and a bolus and infusion of Aggrastat was started. Also loaded with Brilinta.  Will need debate concerning definitive therapy which classically she had include coronary bypass surgery but at her young age multisite PCI there is also an option.  Patient Profile     36 y.o. female type 2 diabetes mellitus, obesity ( s/p lab band ) who presented 11/4 withweeks ofcrushing chest pain radiating to the left arm associated with shortness of breath and palpitations. She was found to have troponin with peak > 65,ST segment elevations in inferior lead, taken for emergent cath 11/4 and was found to have total occlusion of the RCA, high grade obstruction of the LAD and circumflex. She had angioplasty and a stent placed in the mid RCA but the procedure became complicated by loss of venous access. She was evaluated by CT surgery and should go for CABG later this week after Brilinta washout  Assessment & Plan    STEMI s/p angioplasty and stent to RCA with residual multivessel disease -TCTS waiting for completion of 7 day brilinta wash out.  Cardiac rehab initiated. Medical management is with aspirin, tirofiban, atorvastatin 80 mg qd, carvedilol 9.375 mg qd, and imdur 30 mg qd. BP may not support the addition of ACE.   Type 2 diabetes mellitus - on insulin sliding scale with good control of blood glucose   Morbid obesity- phase 1 of cardiac rehab initiated   Hyperlipidemia, LDL 57, on atorvastatin 80 mg qd   For questions or updates, please contact [place]  [facility] Please consult [email or web address] for contact info under Cardiology/STEMI.     Signed, Ledell Noss, MD  07/29/2017, 7:05 AM     Patient seen and examined.  Agree with assessment and plan. No recurrent chest pain; now off iv NTG and on imdur. Coreg increased yesterday ; HR today in the the 70s-80's will slightly titrate to 12.5 mg bid.  Venous mapping of LE and left hand palmar arch completed. On aggrastat during brilinta washout. Will transfer to stepdown/telemetry. For CABG with Dr. Servando Snare on 11/13.  Troy Sine, MD, [facility] 07/29/2017 8:05 AM

## 2017-07-29 NOTE — Progress Notes (Signed)
CARDIAC REHAB PHASE I   PRE:  Rate/Rhythm: 78 SR  BP:  Sitting: 136/89        SaO2: 100 RA  MODE:  Ambulation: 1110 ft   POST:  Rate/Rhythm: 73 SR  BP:  Sitting: 136/95         SaO2: 98 RA  Pt ambulated 1110 ft on RA, IV, independent, slow, steady gait, tolerated well with no complaints during walk, however, upon return to room, after 3 minutes of rest, pt c/o 2-3/10 chest pressure. RN notified. Pt to recliner after walk, call bell within reach. Will follow.   [id number] Lenna Sciara, RN, BSN 07/29/2017 10:44 AM

## 2017-07-30 LAB — BASIC METABOLIC PANEL
Anion gap: 6 (ref 5–15)
BUN: 13 mg/dL (ref 6–20)
CO2: 25 mmol/L (ref 22–32)
Calcium: 8.2 mg/dL — ABNORMAL LOW (ref 8.9–10.3)
Chloride: 108 mmol/L (ref 101–111)
Creatinine, Ser: 0.83 mg/dL (ref 0.44–1.00)
GFR calc Af Amer: >60 mL/min (ref >60)
GFR calc non Af Amer: >60 mL/min (ref >60)
Glucose, Bld: 133 mg/dL — ABNORMAL HIGH (ref 65–99)
Potassium: 3.7 mmol/L (ref 3.5–5.1)
Sodium: 139 mmol/L (ref 135–145)

## 2017-07-30 LAB — GLUCOSE, CAPILLARY
Glucose-Capillary: 135 mg/dL — ABNORMAL HIGH (ref 65–99)
Glucose-Capillary: 150 mg/dL — ABNORMAL HIGH (ref 65–99)
Glucose-Capillary: 124 mg/dL — ABNORMAL HIGH (ref 65–99)
Glucose-Capillary: 124 mg/dL — ABNORMAL HIGH (ref 65–99)

## 2017-07-30 MED ORDER — COLCHICINE 0.6 MG PO TABS
0.6000 mg | ORAL_TABLET | Freq: Two times a day (BID) | ORAL | Status: DC
  Administered 2017-07-30 – 2017-08-05 (×10): 0.6 mg via ORAL
  Filled 2017-07-30 (×16): qty 1

## 2017-07-30 NOTE — Progress Notes (Signed)
Progress Note  Patient Name: [name] Date of Encounter: 07/30/2017  Primary Cardiologist:   New (can be followed by Dr. Tamala Julian)  Subjective   No chest pain.  She is complaining of left great toe pain similar to previous gout.  Inpatient Medications    Scheduled Meds: . aspirin  81 mg Oral Daily  . atorvastatin  80 mg Oral q1800  . carvedilol  12.5 mg Oral BID WC  . colchicine  0.6 mg Oral Daily  . heparin  5,000 Units Subcutaneous Q8H  . insulin aspart  0-15 Units Subcutaneous TID WC  . insulin aspart  3 Units Subcutaneous TID WC  . isosorbide mononitrate  30 mg Oral Daily  . living well with diabetes book   Does not apply Once  . sodium chloride flush  3 mL Intravenous Q12H   Continuous Infusions: . sodium chloride 50 mL/hr at 07/29/17 1941  . sodium chloride    . tirofiban 0.15 mcg/kg/min (07/30/17 0004)   PRN Meds: sodium chloride, acetaminophen, ALPRAZolam, ondansetron (ZOFRAN) IV, oxyCODONE, [START ON 08/03/2017] pneumococcal 23 valent vaccine, polyethylene glycol, sodium chloride flush   Vital Signs    Vitals:   07/29/17 2358 07/30/17 0237 07/30/17 0413 07/30/17 0818  BP: 102/76 (!) 94/56 115/79 134/84  Pulse: 79 79  71  Resp: 19 12  11   Temp: 98 F (36.7 C) 98.7 F (37.1 C) 98 F (36.7 C) 98 F (36.7 C)  TempSrc: Oral Oral Oral Oral  SpO2: 98% 100%  100%  Weight:      Height:        Intake/Output Summary (Last 24 hours) at 07/30/2017 1146 Last data filed at 07/30/2017 0600 Gross per 24 hour  Intake 1674.4 ml  Output -  Net 1674.4 ml   Filed Weights   07/28/17 0647 07/29/17 0600 07/29/17 2113  Weight: 280 lb 3.3 oz (127.1 kg) 281 lb 1.4 oz (127.5 kg) 283 lb 4.8 oz (128.5 kg)    Telemetry    NSR - Personally Reviewed  ECG    NA - Personally Reviewed  Physical Exam   GEN: No acute distress.   Neck: No  JVD Cardiac: RRR, no murmurs, rubs, or gallops.  Respiratory: Clear  to auscultation bilaterally. GI: Soft, nontender,  non-distended  MS: No  edema; No deformity. Neuro:  Nonfocal  Psych: Normal affect   Labs    Chemistry Recent Labs  Lab 07/24/17 0518  07/28/17 0402 07/29/17 0732 07/30/17 0252  NA 135   < > 138 139 139  K 4.8   < > 3.9 3.8 3.7  CL 103   < > 108 109 108  CO2 23   < > 24 24 25   GLUCOSE 256*   < > 127* 143* 133*  BUN 13   < > 10 10 13   CREATININE 0.86   < > 0.72 0.80 0.83  CALCIUM 8.5*   < > 8.4* 8.5* 8.2*  PROT 6.7  --   --   --   --   ALBUMIN 3.2*  --   --   --   --   AST 26  --   --   --   --   ALT 18  --   --   --   --   ALKPHOS 65  --   --   --   --   BILITOT 0.6  --   --   --   --   GFRNONAA >60   < > >  60 >60 >60  GFRAA >60   < > >60 >60 >60  ANIONGAP 9   < > 6 6 6    < > = values in this interval not displayed.     Hematology Recent Labs  Lab 07/27/17 0438 07/28/17 0402 07/29/17 0219  WBC 8.1 7.3 7.5  RBC 4.42 4.23 4.13  HGB 11.8* 11.4* 11.2*  HCT 36.2 34.7* 33.7*  MCV 81.9 82.0 81.6  MCH 26.7 27.0 27.1  MCHC 32.6 32.9 33.2  RDW 13.7 13.7 13.5  PLT 215 215 207    Cardiac Enzymes Recent Labs  Lab 07/24/17 1105 07/24/17 1845 07/24/17 2356  TROPONINI >65.00* 61.51* 53.55*    Recent Labs  Lab 07/24/17 0537  TROPIPOC 0.03     BNPNo results for input(s): BNP, PROBNP in the last 168 hours.   DDimer No results for input(s): DDIMER in the last 168 hours.   Radiology    No results found.  Cardiac Studies   CATH:     36 year old with strong family history of CAD, prior diabetes in her early 15s, morbid obesity, status post bariatric surgery with recurrent weight gain. Presented with chest discomfort that awakened her from sleep and in the emergency department had ST elevation in the inferior leads compatible with inferior STEMI.  RCA totally occluded in the mid vessel. High-grade obstruction in the proximal and mid LAD. High-grade obstruction in the mid circumflex.  Successful angioplasty and stent of the mid RCA, revealing severe complex  distal RCA PDA [place] 111 true bifurcation stenosis.   Procedure was complicated byloss of venous access midway through the procedure, inability to adequately anticoagulate because of IV infiltration, and low ACT of around 1/92 after stent implantation. Subsequently intra-arterial heparin was administered. Closing ACT 290 seconds. Second IV site started and a bolus and infusion of Aggrastat was started. Also loaded with Brilinta.      Patient Profile     36 y.o. female with  type 2 diabetes mellitus, obesity ( s/p lab band ) who presented 11/4 withweeks ofcrushing chest pain radiating to the left arm associated with shortness of breath and palpitations. She was found to have troponin with peak > 65,ST segment elevations in inferior lead, taken for emergent cath11/4and was found to have total occlusion of the RCA, high grade obstruction of the LAD and circumflex. She had angioplasty and a stent placed in the mid RCA but the procedure became complicated by loss of venous access. She was evaluated by CT surgery and should go for CABG later this week after Brilinta washout    Assessment & Plan    CAD:  Inferior STEMI.   PCI of RCA and waiting for ticagrelor to wash out.  On tirofiban.  On ASA.    DM:  Continue current therapy.   Continue current insulin dosing  DYSLIPIDEMIA:   On high dose statin.  GOUT:  Increase colchicine.    Signed, Minus Breeding, MD  07/30/2017, 11:46 AM

## 2017-07-30 NOTE — Progress Notes (Signed)
Picked pt up at 3 pm

## 2017-07-30 NOTE — Progress Notes (Signed)
CARDIAC REHAB PHASE I   PRE:  Rate/Rhythm:82  BP:   Sitting: 115/75     SaO2: 100% RA  MODE:  Ambulation: 470 ft   POST:  Rate/Rhythm: 92     Sitting: 136/89     SaO2: 100% RA    Pt ambulated 426ft independently with IV pole. Maintained a slow but steady gait. Pt denied symptoms of SOB, dizziness and CP. Pt returned to bedside with VSS. Pt is eager to start outpatient cardiac rehab and does not any questions regarding upcoming surgery. Pt is very polite.    [name] D Ardith Test,MS,ACSM -RCEP 07/30/2017 3:36 PM

## 2017-07-31 LAB — BASIC METABOLIC PANEL
Anion gap: 6 (ref 5–15)
BUN: 12 mg/dL (ref 6–20)
CO2: 24 mmol/L (ref 22–32)
Calcium: 8.1 mg/dL — ABNORMAL LOW (ref 8.9–10.3)
Chloride: 109 mmol/L (ref 101–111)
Creatinine, Ser: 0.74 mg/dL (ref 0.44–1.00)
GFR calc Af Amer: >60 mL/min (ref >60)
GFR calc non Af Amer: >60 mL/min (ref >60)
Glucose, Bld: 152 mg/dL — ABNORMAL HIGH (ref 65–99)
Potassium: 3.6 mmol/L (ref 3.5–5.1)
Sodium: 139 mmol/L (ref 135–145)

## 2017-07-31 LAB — GLUCOSE, CAPILLARY
Glucose-Capillary: 118 mg/dL — ABNORMAL HIGH (ref 65–99)
Glucose-Capillary: 150 mg/dL — ABNORMAL HIGH (ref 65–99)
Glucose-Capillary: 122 mg/dL — ABNORMAL HIGH (ref 65–99)
Glucose-Capillary: 148 mg/dL — ABNORMAL HIGH (ref 65–99)

## 2017-07-31 NOTE — Plan of Care (Signed)
  Clinical Measurements: Will remain free from infection 07/31/2017 0047 - Progressing by Blair Promise, RN Respiratory complications will improve 07/31/2017 0047 - Progressing by Blair Promise, RN   Coping: Level of anxiety will decrease 07/31/2017 0047 - Progressing by Blair Promise, RN   Pain Managment: General experience of comfort will improve 07/31/2017 0047 - Progressing by Blair Promise, RN   Safety: Ability to remain free from injury will improve 07/31/2017 0047 - Progressing by Blair Promise, RN

## 2017-07-31 NOTE — Plan of Care (Signed)
  Clinical Measurements: Ability to maintain clinical measurements within normal limits will improve 07/31/2017 2250 - Progressing by Blair Promise, RN Will remain free from infection 07/31/2017 2250 - Progressing by Blair Promise, RN   Coping: Level of anxiety will decrease 07/31/2017 2250 - Progressing by Blair Promise, RN   Pain Managment: General experience of comfort will improve 07/31/2017 2250 - Progressing by Blair Promise, RN   Education: Understanding of cardiac disease, CV risk reduction, and recovery process will improve 07/31/2017 2250 - Progressing by Blair Promise, RN

## 2017-07-31 NOTE — Progress Notes (Signed)
Progress Note  Patient Name: [name] Date of Encounter: 07/31/2017  Primary Cardiologist:   New (can be followed by Dr. Tamala Julian)  Subjective   No chest pain.  No SOB.   Inpatient Medications    Scheduled Meds: . aspirin  81 mg Oral Daily  . atorvastatin  80 mg Oral q1800  . carvedilol  12.5 mg Oral BID WC  . colchicine  0.6 mg Oral BID  . heparin  5,000 Units Subcutaneous Q8H  . insulin aspart  0-15 Units Subcutaneous TID WC  . insulin aspart  3 Units Subcutaneous TID WC  . isosorbide mononitrate  30 mg Oral Daily  . living well with diabetes book   Does not apply Once  . sodium chloride flush  3 mL Intravenous Q12H   Continuous Infusions: . sodium chloride 50 mL/hr at 07/31/17 0600  . sodium chloride    . tirofiban 0.15 mcg/kg/min (07/31/17 0600)   PRN Meds: sodium chloride, acetaminophen, ALPRAZolam, ondansetron (ZOFRAN) IV, oxyCODONE, [START ON 08/03/2017] pneumococcal 23 valent vaccine, polyethylene glycol, sodium chloride flush   Vital Signs    Vitals:   07/30/17 1920 07/31/17 0019 07/31/17 0448 07/31/17 0800  BP: 131/89 98/62 107/69 134/85  Pulse: 81 78  84  Resp: (!) 25 19    Temp:  97.9 F (36.6 C) 97.8 F (36.6 C) 97.9 F (36.6 C)  TempSrc: Oral Oral Oral Oral  SpO2: 100% 97%    Weight:      Height:        Intake/Output Summary (Last 24 hours) at 07/31/2017 0956 Last data filed at 07/31/2017 0600 Gross per 24 hour  Intake 1910.4 ml  Output -  Net 1910.4 ml   Filed Weights   07/28/17 0647 07/29/17 0600 07/29/17 2113  Weight: 280 lb 3.3 oz (127.1 kg) 281 lb 1.4 oz (127.5 kg) 283 lb 4.8 oz (128.5 kg)    Telemetry    NSR - Personally Reviewed  ECG    NA - Personally Reviewed  Physical Exam   GENERAL:  Well appearing NECK:  No jugular venous distention, waveform within normal limits, carotid upstroke brisk and symmetric, no bruits, no thyromegaly LUNGS:  Clear to auscultation bilaterally CHEST:  Unremarkable HEART:  PMI not  displaced or sustained,S1 and S2 within normal limits, no S3, no S4, no clicks, no rubs, no murmurs ABD:  Flat, positive bowel sounds normal in frequency in pitch, no bruits, no rebound, no guarding, no midline pulsatile mass, no hepatomegaly, no splenomegaly EXT:  2 plus pulses throughout, no edema, no cyanosis no clubbing   Labs    Chemistry Recent Labs  Lab 07/29/17 0732 07/30/17 0252 07/31/17 0219  NA 139 139 139  K 3.8 3.7 3.6  CL 109 108 109  CO2 24 25 24   GLUCOSE 143* 133* 152*  BUN 10 13 12   CREATININE 0.80 0.83 0.74  CALCIUM 8.5* 8.2* 8.1*  GFRNONAA >60 >60 >60  GFRAA >60 >60 >60  ANIONGAP 6 6 6      Hematology Recent Labs  Lab 07/27/17 0438 07/28/17 0402 07/29/17 0219  WBC 8.1 7.3 7.5  RBC 4.42 4.23 4.13  HGB 11.8* 11.4* 11.2*  HCT 36.2 34.7* 33.7*  MCV 81.9 82.0 81.6  MCH 26.7 27.0 27.1  MCHC 32.6 32.9 33.2  RDW 13.7 13.7 13.5  PLT 215 215 207    Cardiac Enzymes Recent Labs  Lab 07/24/17 1105 07/24/17 1845 07/24/17 2356  TROPONINI >65.00* 61.51* 53.55*    No results for  input(s): TROPIPOC in the last 168 hours.   BNPNo results for input(s): BNP, PROBNP in the last 168 hours.   DDimer No results for input(s): DDIMER in the last 168 hours.   Radiology    No results found.  Cardiac Studies   CATH:     36 year old with strong family history of CAD, prior diabetes in her early 54s, morbid obesity, status post bariatric surgery with recurrent weight gain. Presented with chest discomfort that awakened her from sleep and in the emergency department had ST elevation in the inferior leads compatible with inferior STEMI.  RCA totally occluded in the mid vessel. High-grade obstruction in the proximal and mid LAD. High-grade obstruction in the mid circumflex.  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   Procedure was complicated byloss of venous access midway  through the procedure, inability to adequately anticoagulate because of IV infiltration, and low ACT of around 1/92 after stent implantation. Subsequently intra-arterial heparin was administered. Closing ACT 290 seconds. Second IV site started and a bolus and infusion of Aggrastat was started. Also loaded with Brilinta.   Patient Profile     36 y.o. female with  type 2 diabetes mellitus, obesity ( s/p lab band ) who presented 11/4 withweeks ofcrushing chest pain radiating to the left arm associated with shortness of breath and palpitations. She was found to have troponin with peak > 65,ST segment elevations in inferior lead, taken for emergent cath11/4and was found to have total occlusion of the RCA, high grade obstruction of the LAD and circumflex. She had angioplasty and a stent placed in the mid RCA but the procedure became complicated by loss of venous access. She was evaluated by CT surgery and should go for CABG later this week after Brilinta washout    Assessment & Plan    CAD:  Inferior STEMI.   PCI of RCA and waiting for ticagrelor to wash out.  On tirofiban.  On ASA.     DM:  Continue current insulin and SSI  DYSLIPIDEMIA:   Continue current statin dose.    GOUT:  Improved.  Continue current meds.   Signed, Minus Breeding, MD  07/31/2017, 9:56 AM

## 2017-08-01 ENCOUNTER — Encounter (HOSPITAL_COMMUNITY): Payer: Self-pay | Admitting: Certified Registered Nurse Anesthetist

## 2017-08-01 ENCOUNTER — Inpatient Hospital Stay (HOSPITAL_COMMUNITY): Payer: BC Managed Care – PPO

## 2017-08-01 DIAGNOSIS — E1169 Type 2 diabetes mellitus with other specified complication: Secondary | ICD-10-CM

## 2017-08-01 DIAGNOSIS — E669 Obesity, unspecified: Secondary | ICD-10-CM

## 2017-08-01 LAB — COMPREHENSIVE METABOLIC PANEL
ALT: 30 U/L (ref 14–54)
AST: 29 U/L (ref 15–41)
Albumin: 3.2 g/dL — ABNORMAL LOW (ref 3.5–5.0)
Alkaline Phosphatase: 80 U/L (ref 38–126)
Anion gap: 10 (ref 5–15)
BUN: 12 mg/dL (ref 6–20)
CO2: 21 mmol/L — ABNORMAL LOW (ref 22–32)
Calcium: 8.4 mg/dL — ABNORMAL LOW (ref 8.9–10.3)
Chloride: 107 mmol/L (ref 101–111)
Creatinine, Ser: 0.88 mg/dL (ref 0.44–1.00)
GFR calc Af Amer: >60 mL/min (ref >60)
GFR calc non Af Amer: >60 mL/min (ref >60)
Glucose, Bld: 131 mg/dL — ABNORMAL HIGH (ref 65–99)
Potassium: 3.8 mmol/L (ref 3.5–5.1)
Sodium: 138 mmol/L (ref 135–145)
Total Bilirubin: 0.7 mg/dL (ref 0.3–1.2)
Total Protein: 6.6 g/dL (ref 6.5–8.1)

## 2017-08-01 LAB — BASIC METABOLIC PANEL
Anion gap: 4 — ABNORMAL LOW (ref 5–15)
BUN: 11 mg/dL (ref 6–20)
CO2: 26 mmol/L (ref 22–32)
Calcium: 8 mg/dL — ABNORMAL LOW (ref 8.9–10.3)
Chloride: 109 mmol/L (ref 101–111)
Creatinine, Ser: 0.76 mg/dL (ref 0.44–1.00)
GFR calc Af Amer: >60 mL/min (ref >60)
GFR calc non Af Amer: >60 mL/min (ref >60)
Glucose, Bld: 165 mg/dL — ABNORMAL HIGH (ref 65–99)
Potassium: 3.6 mmol/L (ref 3.5–5.1)
Sodium: 139 mmol/L (ref 135–145)

## 2017-08-01 LAB — GLUCOSE, CAPILLARY
Glucose-Capillary: 144 mg/dL — ABNORMAL HIGH (ref 65–99)
Glucose-Capillary: 125 mg/dL — ABNORMAL HIGH (ref 65–99)
Glucose-Capillary: 147 mg/dL — ABNORMAL HIGH (ref 65–99)
Glucose-Capillary: 111 mg/dL — ABNORMAL HIGH (ref 65–99)

## 2017-08-01 LAB — PROTIME-INR
INR: 0.99
Prothrombin Time: 13 seconds (ref 11.4–15.2)

## 2017-08-01 LAB — HEMOGLOBIN A1C
Hgb A1c MFr Bld: 7.1 % — ABNORMAL HIGH (ref 4.8–5.6)
Mean Plasma Glucose: 157.07 mg/dL

## 2017-08-01 LAB — TYPE AND SCREEN
ABO/RH(D): AB POS
Antibody Screen: NEGATIVE

## 2017-08-01 LAB — APTT: aPTT: 20 seconds — ABNORMAL LOW (ref 24–36)

## 2017-08-01 MED ORDER — SODIUM CHLORIDE 0.9 % IV SOLN
INTRAVENOUS | Status: DC
  Filled 2017-08-01: qty 30

## 2017-08-01 MED ORDER — SODIUM CHLORIDE 0.9 % IV SOLN
INTRAVENOUS | Status: AC
  Administered 2017-08-02: 1.4 [IU]/h via INTRAVENOUS
  Filled 2017-08-01: qty 1

## 2017-08-01 MED ORDER — DOPAMINE-DEXTROSE 3.2-5 MG/ML-% IV SOLN
0.0000 ug/kg/min | INTRAVENOUS | Status: DC
  Filled 2017-08-01: qty 250

## 2017-08-01 MED ORDER — CHLORHEXIDINE GLUCONATE CLOTH 2 % EX PADS: 6.0000 | MEDICATED_PAD | Freq: Once | CUTANEOUS | Status: DC

## 2017-08-01 MED ORDER — EPINEPHRINE PF 1 MG/ML IJ SOLN
0.0000 ug/min | INTRAVENOUS | Status: DC
  Filled 2017-08-01: qty 4

## 2017-08-01 MED ORDER — CHLORHEXIDINE GLUCONATE 0.12 % MT SOLN
15.0000 mL | Freq: Once | OROMUCOSAL | Status: AC
  Administered 2017-08-02: 15 mL via OROMUCOSAL
  Filled 2017-08-01: qty 15

## 2017-08-01 MED ORDER — DEXTROSE 5 % IV SOLN
1.5000 g | INTRAVENOUS | Status: AC
  Administered 2017-08-02: .75 g via INTRAVENOUS
  Administered 2017-08-02: 1.5 g via INTRAVENOUS
  Filled 2017-08-01: qty 1.5

## 2017-08-01 MED ORDER — NITROGLYCERIN IN D5W 200-5 MCG/ML-% IV SOLN
2.0000 ug/min | INTRAVENOUS | Status: AC
  Administered 2017-08-02: 16.6 ug/min via INTRAVENOUS
  Filled 2017-08-01: qty 250

## 2017-08-01 MED ORDER — TEMAZEPAM 7.5 MG PO CAPS
15.0000 mg | ORAL_CAPSULE | Freq: Once | ORAL | Status: AC | PRN
  Administered 2017-08-01: 15 mg via ORAL
  Filled 2017-08-01: qty 2

## 2017-08-01 MED ORDER — SODIUM CHLORIDE 0.9 % IV SOLN
30.0000 ug/min | INTRAVENOUS | Status: AC
  Administered 2017-08-02: 20 ug/min via INTRAVENOUS
  Filled 2017-08-01: qty 2

## 2017-08-01 MED ORDER — DEXTROSE 5 % IV SOLN
750.0000 mg | INTRAVENOUS | Status: DC
  Filled 2017-08-01: qty 750

## 2017-08-01 MED ORDER — BISACODYL 5 MG PO TBEC
5.0000 mg | DELAYED_RELEASE_TABLET | Freq: Once | ORAL | Status: DC
  Filled 2017-08-01: qty 1

## 2017-08-01 MED ORDER — POTASSIUM CHLORIDE 2 MEQ/ML IV SOLN
80.0000 meq | INTRAVENOUS | Status: DC
  Filled 2017-08-01: qty 40

## 2017-08-01 MED ORDER — PLASMA-LYTE 148 IV SOLN
INTRAVENOUS | Status: AC
  Administered 2017-08-02: 06:00:00
  Filled 2017-08-01: qty 2.5

## 2017-08-01 MED ORDER — VANCOMYCIN HCL 10 G IV SOLR
1500.0000 mg | INTRAVENOUS | Status: AC
  Administered 2017-08-02: 1500 mg via INTRAVENOUS
  Filled 2017-08-01: qty 1500

## 2017-08-01 MED ORDER — MILRINONE LACTATE IN DEXTROSE 20-5 MG/100ML-% IV SOLN
0.1250 ug/kg/min | INTRAVENOUS | Status: DC
  Filled 2017-08-01: qty 100

## 2017-08-01 MED ORDER — METOPROLOL TARTRATE 12.5 MG HALF TABLET
12.5000 mg | ORAL_TABLET | Freq: Once | ORAL | Status: AC
  Administered 2017-08-02: 12.5 mg via ORAL
  Filled 2017-08-01: qty 1

## 2017-08-01 MED ORDER — SODIUM CHLORIDE 0.9 % IV SOLN
1.5000 mg/kg/h | INTRAVENOUS | Status: AC
  Administered 2017-08-02: 11:00:00 via INTRAVENOUS
  Administered 2017-08-02: 1.5 mg/kg/h via INTRAVENOUS
  Filled 2017-08-01: qty 25

## 2017-08-01 MED ORDER — MAGNESIUM SULFATE 50 % IJ SOLN
40.0000 meq | INTRAMUSCULAR | Status: DC
  Filled 2017-08-01: qty 9.85

## 2017-08-01 MED ORDER — DEXMEDETOMIDINE HCL IN NACL 400 MCG/100ML IV SOLN
0.1000 ug/kg/h | INTRAVENOUS | Status: AC
  Administered 2017-08-02: .7 ug/kg/h via INTRAVENOUS
  Filled 2017-08-01: qty 100

## 2017-08-01 MED ORDER — CHLORHEXIDINE GLUCONATE CLOTH 2 % EX PADS
6.0000 | MEDICATED_PAD | Freq: Once | CUTANEOUS | Status: AC
  Administered 2017-08-01: 6 via TOPICAL

## 2017-08-01 MED ORDER — TRANEXAMIC ACID (OHS) PUMP PRIME SOLUTION
2.0000 mg/kg | INTRAVENOUS | Status: DC
  Filled 2017-08-01: qty 2.57

## 2017-08-01 MED ORDER — TRANEXAMIC ACID (OHS) BOLUS VIA INFUSION
15.0000 mg/kg | INTRAVENOUS | Status: AC
  Administered 2017-08-02: 1927.5 mg via INTRAVENOUS
  Filled 2017-08-01: qty 1928

## 2017-08-01 NOTE — Progress Notes (Signed)
[address]       [place] [address]             [phone]                 8 Days Post-Op Procedure(s) (LRB): Coronary/Graft Acute MI Revascularization (N/A) LEFT HEART CATH AND CORONARY ANGIOGRAPHY (N/A)  LOS: 8 days   Subjective: No recurrent chest pain   Objective: Vital signs in last 24 hours: Patient Vitals for the past 24 hrs:  BP Temp Temp src Pulse Resp  08/01/17 0442 116/74 98.5 F (36.9 C) Oral 86 14  08/01/17 0007 121/81 98 F (36.7 C) Oral 92 16  07/31/17 2013 112/70 98.5 F (36.9 C) Oral 81 18    Filed Weights   07/28/17 0647 07/29/17 0600 07/29/17 2113  Weight: 280 lb 3.3 oz (127.1 kg) 281 lb 1.4 oz (127.5 kg) 283 lb 4.8 oz (128.5 kg)    Hemodynamic parameters for last 24 hours:    Intake/Output from previous day: 11/11 0701 - 11/12 0700 In: 2353 [P.O.:240; I.V.:914] Out: -  Intake/Output this shift: Total I/O In: 480 [P.O.:480] Out: -   Scheduled Meds: . aspirin  81 mg Oral Daily  . atorvastatin  80 mg Oral q1800  . bisacodyl  5 mg Oral Once  . carvedilol  12.5 mg Oral BID WC  . [START ON 08/02/2017] chlorhexidine  15 mL Mouth/Throat Once  . colchicine  0.6 mg Oral BID  . [START ON 08/02/2017] heparin-papaverine-plasmalyte irrigation   Irrigation To OR  . heparin  5,000 Units Subcutaneous Q8H  . insulin aspart  0-15 Units Subcutaneous TID WC  . insulin aspart  3 Units Subcutaneous TID WC  . isosorbide mononitrate  30 mg Oral Daily  . living well with diabetes book   Does not apply Once  . [START ON 08/02/2017] magnesium sulfate  40 mEq Other To OR  . [START ON 08/02/2017] metoprolol tartrate  12.5 mg Oral Once  . [START ON 08/02/2017] potassium chloride  80 mEq Other To OR  . sodium chloride flush  3 mL Intravenous Q12H  . [START ON 08/02/2017] tranexamic acid  15 mg/kg Intravenous To OR  . [START ON 08/02/2017] tranexamic acid  2 mg/kg Intracatheter To OR   Continuous Infusions: . sodium chloride 50 mL/hr at  08/01/17 0100  . sodium chloride    . [START ON 08/02/2017] cefUROXime (ZINACEF)  IV    . [START ON 08/02/2017] cefUROXime (ZINACEF)  IV    . [START ON 08/02/2017] dexmedetomidine    . [START ON 08/02/2017] DOPamine    . [START ON 08/02/2017] epinephrine    . [START ON 08/02/2017] heparin 30,000 units/NS 1000 mL solution for CELLSAVER    . [START ON 08/02/2017] insulin (NOVOLIN-R) infusion    . [START ON 08/02/2017] milrinone    . [START ON 08/02/2017] nitroGLYCERIN    . [START ON 08/02/2017] phenylephrine 20mg /250mL NS (0.08mg /ml) infusion    . tirofiban 0.15 mcg/kg/min (08/01/17 0542)  . [START ON 08/02/2017] tranexamic acid (CYKLOKAPRON) infusion ([facility])    . [START ON 08/02/2017] vancomycin     PRN Meds:.sodium chloride, acetaminophen, ALPRAZolam, ondansetron (ZOFRAN) IV, oxyCODONE, [START ON 08/03/2017] pneumococcal 23 valent vaccine, polyethylene glycol, sodium chloride flush, temazepam  General appearance: alert, cooperative and appears older than stated age Neurologic: intact Heart: regular rate and rhythm, S1, S2 normal, no murmur, click, rub or gallop Lungs: clear to auscultation bilaterally Abdomen: soft, non-tender; bowel sounds normal; no  masses,  no organomegaly Extremities: no uceration or edema  Lab Results: CBC:No results for input(s): WBC, HGB, HCT, PLT in the last 72 hours. BMET:  Recent Labs    07/31/17 0219 08/01/17 0256  NA 139 139  K 3.6 3.6  CL 109 109  CO2 24 26  GLUCOSE 152* 165*  BUN 12 11  CREATININE 0.74 0.76  CALCIUM 8.1* 8.0*    PT/INR: No results for input(s): LABPROT, INR in the last 72 hours.   Radiology No results found.   Assessment/Plan:  The goals risks and alternatives of the planned surgical procedure coronary artery bypass grafting with possible left radial artery harvest have been discussed with the patient in detail. The risks of the procedure including death, infection, stroke, myocardial infarction, bleeding, blood  transfusion, hand ischemia with loss of neurologic function  have all been discussed specifically.  I have quoted [name] a 3 % of perioperative mortality and a complication rate as high as 40 %. The patient's questions have been answered.[name] is willing  to proceed with the planned procedure.   Grace Isaac MD 08/01/2017 3:23 PM

## 2017-08-01 NOTE — Progress Notes (Signed)
CARDIAC REHAB PHASE I   PRE:  Rate/Rhythm: 78 SR  BP:  Supine:   Sitting: 155/92  Standing:    SaO2: 97%RA  MODE:  Ambulation: 840 ft   POST:  Rate/Rhythm: 88 SR  BP:  Supine:   Sitting: 160/89  Standing:    SaO2: 100%RA 1107-1140 Pt walked 840 ft on RA independently with steady gait. Tolerated well. Ready for surgery tomorrow.    Graylon Good, RN BSN  08/01/2017 11:36 AM

## 2017-08-01 NOTE — Anesthesia Preprocedure Evaluation (Addendum)
Anesthesia Evaluation  Patient identified by MRN, date of birth, ID band Patient awake    Reviewed: Allergy & Precautions, NPO status , Patient's Chart, lab work & pertinent test results  History of Anesthesia Complications Negative for: history of anesthetic complications  Airway Mallampati: III  TM Distance: >3 FB Neck ROM: Full    Dental no notable dental hx. (+) Dental Advisory Given, Teeth Intact,    Pulmonary neg pulmonary ROS,    Pulmonary exam normal        Cardiovascular + CAD and + Past MI  Normal cardiovascular exam  Impressions:  - LVEF 55-60%, mild LVH, normal wall motion, normal diastolic   function, normal LA size, normal IVC    Neuro/Psych negative neurological ROS  negative psych ROS   GI/Hepatic negative GI ROS, Neg liver ROS,   Endo/Other  diabetesMorbid obesity  Renal/GU negative Renal ROS     Musculoskeletal negative musculoskeletal ROS (+)   Abdominal   Peds  Hematology negative hematology ROS (+)   Anesthesia Other Findings Day of surgery medications reviewed with the patient.  Reproductive/Obstetrics                           Anesthesia Physical Anesthesia Plan  ASA: IV  Anesthesia Plan: General   Post-op Pain Management:    Induction: Intravenous  PONV Risk Score and Plan: 3 and Ondansetron, Dexamethasone and Midazolam  Airway Management Planned: Oral ETT  Additional Equipment: Arterial line, PA Cath, 3D TEE and Ultrasound Guidance Line Placement  Intra-op Plan:   Post-operative Plan: Post-operative intubation/ventilation  Informed Consent: I have reviewed the patients History and Physical, chart, labs and discussed the procedure including the risks, benefits and alternatives for the proposed anesthesia with the patient or authorized representative who has indicated his/her understanding and acceptance.   Dental advisory given  Plan Discussed  with: CRNA, Anesthesiologist and Surgeon  Anesthesia Plan Comments:        Anesthesia Quick Evaluation

## 2017-08-01 NOTE — Care Management Note (Signed)
Case Management Note Marvetta Gibbons RN, BSN Unit 4E-Case Manager-- [place] coverage [phone]  Patient Details  Name: [name] MRN: [id number] Date of Birth: 27-Aug-1981  Subjective/Objective:  Pt admitted with STEMI s/p angioplasty and stent to the mid RCA with severe 3 vessel disease- CT surgery met with the her and  Plan to proceed with CABG.  Per MD note -She was given aggrastat and loaded with Brilinta after PCI so the CABG will be delayed until later this week                   Action/Plan: PTA pt lived at home with spouse- independent- CM to follow post op for d/c needs  Expected Discharge Date:  08/04/17               Expected Discharge Plan:  Home/Self Care  In-House Referral:  NA  Discharge planning Services  CM Consult, Medication Assistance  Post Acute Care Choice:    Choice offered to:     DME Arranged:    DME Agency:     HH Arranged:    [place] Agency:     Status of Service:  In process, will continue to follow  If discussed at Long Length of Stay Meetings, dates discussed:    Discharge Disposition:   Additional Comments:  08/01/17- 1030- [name] RN, CM- pt for CABG on 08/02/17- after Brilinta washout- spoke with pt at bedside- she plans to stay with her mom after surgery who will be able to assist her. Questions answered about billing and such.  She has [employer] disability paperwork at the bedside that she needs MD to fill out for her job- will see if they are willing to fill out here or if her mom needs to take by office. CM will continue to follow post op for any further d/c needs.   Dawayne Patricia, RN 08/01/2017, 11:12 AM

## 2017-08-01 NOTE — Plan of Care (Signed)
  Coping: Level of anxiety will decrease 08/01/2017 2329 - Progressing by Blair Promise, RN   Safety: Ability to remain free from injury will improve 08/01/2017 2329 - Progressing by Blair Promise, RN

## 2017-08-01 NOTE — Progress Notes (Signed)
Progress Note  Patient Name: [name] Date of Encounter: 08/01/2017  Primary Cardiologist: Linard Millers  Subjective   Narrative chest discomfort.  Awaiting coronary grafting.  Inpatient Medications    Scheduled Meds: . aspirin  81 mg Oral Daily  . atorvastatin  80 mg Oral q1800  . bisacodyl  5 mg Oral Once  . carvedilol  12.5 mg Oral BID WC  . [START ON 08/02/2017] chlorhexidine  15 mL Mouth/Throat Once  . colchicine  0.6 mg Oral BID  . [START ON 08/02/2017] heparin-papaverine-plasmalyte irrigation   Irrigation To OR  . heparin  5,000 Units Subcutaneous Q8H  . insulin aspart  0-15 Units Subcutaneous TID WC  . insulin aspart  3 Units Subcutaneous TID WC  . isosorbide mononitrate  30 mg Oral Daily  . living well with diabetes book   Does not apply Once  . [START ON 08/02/2017] magnesium sulfate  40 mEq Other To OR  . [START ON 08/02/2017] metoprolol tartrate  12.5 mg Oral Once  . [START ON 08/02/2017] potassium chloride  80 mEq Other To OR  . sodium chloride flush  3 mL Intravenous Q12H  . [START ON 08/02/2017] tranexamic acid  15 mg/kg Intravenous To OR  . [START ON 08/02/2017] tranexamic acid  2 mg/kg Intracatheter To OR   Continuous Infusions: . sodium chloride 50 mL/hr at 08/01/17 0100  . sodium chloride    . [START ON 08/02/2017] cefUROXime (ZINACEF)  IV    . [START ON 08/02/2017] cefUROXime (ZINACEF)  IV    . [START ON 08/02/2017] dexmedetomidine    . [START ON 08/02/2017] DOPamine    . [START ON 08/02/2017] epinephrine    . [START ON 08/02/2017] heparin 30,000 units/NS 1000 mL solution for CELLSAVER    . [START ON 08/02/2017] insulin (NOVOLIN-R) infusion    . [START ON 08/02/2017] milrinone    . [START ON 08/02/2017] nitroGLYCERIN    . [START ON 08/02/2017] phenylephrine 20mg /216mL NS (0.08mg /ml) infusion    . tirofiban 0.15 mcg/kg/min (08/01/17 0542)  . [START ON 08/02/2017] tranexamic acid (CYKLOKAPRON) infusion ([facility])    . [START ON 08/02/2017]  vancomycin     PRN Meds: sodium chloride, acetaminophen, ALPRAZolam, ondansetron (ZOFRAN) IV, oxyCODONE, [START ON 08/03/2017] pneumococcal 23 valent vaccine, polyethylene glycol, sodium chloride flush, temazepam   Vital Signs    Vitals:   07/31/17 0800 07/31/17 2013 08/01/17 0007 08/01/17 0442  BP: 134/85 112/70 121/81 116/74  Pulse: 84 81 92 86  Resp:  18 16 14   Temp: 97.9 F (36.6 C) 98.5 F (36.9 C) 98 F (36.7 C) 98.5 F (36.9 C)  TempSrc: Oral Oral Oral Oral  SpO2:      Weight:      Height:        Intake/Output Summary (Last 24 hours) at 08/01/2017 0902 Last data filed at 08/01/2017 0100 Gross per 24 hour  Intake 654 ml  Output -  Net 654 ml   Filed Weights   07/28/17 0647 07/29/17 0600 07/29/17 2113  Weight: 127.1 kg (280 lb 3.3 oz) 127.5 kg (281 lb 1.4 oz) 128.5 kg (283 lb 4.8 oz)    Telemetry    Normal sinus rhythm- Personally Reviewed  ECG    Normal sinus rhythm with inferior Q waves II, III, and aVF.- Personally Reviewed  Physical Exam  Morbidly obese GEN: No acute distress.   Neck: No JVD Cardiac: RRR, no murmurs, rubs, or gallops.  Respiratory: Clear to auscultation bilaterally. GI: Soft, nontender, non-distended  MS: No edema; No deformity. Neuro:  Nonfocal  Psych: Normal affect   Labs    Chemistry Recent Labs  Lab 07/30/17 0252 07/31/17 0219 08/01/17 0256  NA 139 139 139  K 3.7 3.6 3.6  CL 108 109 109  CO2 25 24 26   GLUCOSE 133* 152* 165*  BUN 13 12 11   CREATININE 0.83 0.74 0.76  CALCIUM 8.2* 8.1* 8.0*  GFRNONAA >60 >60 >60  GFRAA >60 >60 >60  ANIONGAP 6 6 4*     Hematology Recent Labs  Lab 07/27/17 0438 07/28/17 0402 07/29/17 0219  WBC 8.1 7.3 7.5  RBC 4.42 4.23 4.13  HGB 11.8* 11.4* 11.2*  HCT 36.2 34.7* 33.7*  MCV 81.9 82.0 81.6  MCH 26.7 27.0 27.1  MCHC 32.6 32.9 33.2  RDW 13.7 13.7 13.5  PLT 215 215 207    Cardiac EnzymesNo results for input(s): TROPONINI in the last 168 hours. No results for input(s):  TROPIPOC in the last 168 hours.   BNPNo results for input(s): BNP, PROBNP in the last 168 hours.   DDimer No results for input(s): DDIMER in the last 168 hours.   Radiology    No results found.  Cardiac Studies   Vein mapping: Was performed on 07/28/2017 and appears to demonstrate ample deep vein supply.  2D Doppler echocardiogram 07/25/17: Study Conclusions   - Left ventricle: The cavity size was normal. Wall thickness was   increased in a pattern of mild LVH. Systolic function was normal.   The estimated ejection fraction was in the range of 55% to 60%.   Wall motion was normal; there were no regional wall motion   abnormalities. Left ventricular diastolic function parameters   were normal. - Mitral valve: Mildly thickened leaflets . There was trivial   regurgitation. - Left atrium: The atrium was normal in size. - Inferior vena cava: The vessel was normal in size. The   respirophasic diameter changes were in the normal range (= 50%),   consistent with normal central venous pressure.   Impressions:   - LVEF 55-60%, mild LVH, normal wall motion, normal diastolic   function, normal LA size, normal IVC   Patient Profile     36 y.o. female female with  type 2 diabetes mellitus, obesity ( s/p lab band ) who presented 11/4 with weeks of crushing chest pain radiating to the left arm associated with shortness of breath and palpitations. She was found to have troponin with peak > 65, ST segment elevations in inferior lead, taken for emergent cath 11/4 and was found to have total occlusion of the RCA, high grade obstruction of the LAD and circumflex. She had angioplasty and a stent placed in the mid RCA but the procedure became complicated by loss of venous access. She was evaluated by CT surgery and should go for CABG later this week after Brilinta washout.     Assessment & Plan    1.  Multivessel coronary artery disease presenting as ST elevation myocardial infarction due to right  coronary occlusion which was successfully treated with emergency stent.  Residual high-grade RCA, LAD, and circumflex disease.  Surgery recommended and planned for tomorrow a.m. -Per Dr. Servando Snare.  Plan to continue Aggrastat until discontinuation several hours prior to CABG.  Anticipate surgery writing orders for a timeframe that is comfortable. 2.  Multiple risk factors including obesity, hypertension, diabetes mellitus, hyperlipidemia, and family history will require aggressive risk factor modification given her young age and already extensive vascular disease.  We  will follow postop.  For questions or updates, please contact [place] Please consult [email or web address] for contact info under Cardiology/STEMI.      Signed, Sinclair Grooms, MD  08/01/2017, 9:02 AM

## 2017-08-02 ENCOUNTER — Inpatient Hospital Stay (HOSPITAL_COMMUNITY): Payer: BC Managed Care – PPO | Admitting: Anesthesiology

## 2017-08-02 ENCOUNTER — Inpatient Hospital Stay (HOSPITAL_COMMUNITY): Payer: BC Managed Care – PPO

## 2017-08-02 ENCOUNTER — Encounter (HOSPITAL_COMMUNITY)
Admission: EM | Disposition: A | Discharge status: Home / Self Care | Payer: Self-pay | Source: Home / Self Care | Attending: Interventional Cardiology

## 2017-08-02 DIAGNOSIS — I25110 Atherosclerotic heart disease of native coronary artery with unstable angina pectoris: Secondary | ICD-10-CM

## 2017-08-02 DIAGNOSIS — I25709 Atherosclerosis of coronary artery bypass graft(s), unspecified, with unspecified angina pectoris: Secondary | ICD-10-CM

## 2017-08-02 DIAGNOSIS — I2119 ST elevation (STEMI) myocardial infarction involving other coronary artery of inferior wall: Secondary | ICD-10-CM

## 2017-08-02 HISTORY — PX: CORONARY ARTERY BYPASS GRAFT: SHX141

## 2017-08-02 HISTORY — PX: TEE WITHOUT CARDIOVERSION: SHX5443

## 2017-08-02 HISTORY — PX: RADIAL ARTERY HARVEST: SHX5067

## 2017-08-02 HISTORY — PX: STERNAL CLOSURE: SHX6203

## 2017-08-02 HISTORY — DX: Atherosclerosis of coronary artery bypass graft(s), unspecified, with unspecified angina pectoris: I25.709

## 2017-08-02 LAB — BLOOD GAS, ARTERIAL
Acid-base deficit: 1.1 mmol/L (ref 0.0–2.0)
Bicarbonate: 22.9 mmol/L (ref 20.0–28.0)
Drawn by: 50222
FIO2: 21
O2 Saturation: 97.1 %
Patient temperature: 98.6
pCO2 arterial: 37.2 mmHg (ref 32.0–48.0)
pH, Arterial: 7.406 (ref 7.350–7.450)
pO2, Arterial: 92.5 mmHg (ref 83.0–108.0)

## 2017-08-02 LAB — POCT I-STAT, CHEM 8
BUN: 9 mg/dL (ref 6–20)
BUN: 8 mg/dL (ref 6–20)
BUN: 7 mg/dL (ref 6–20)
BUN: 7 mg/dL (ref 6–20)
BUN: 7 mg/dL (ref 6–20)
BUN: 7 mg/dL (ref 6–20)
BUN: 7 mg/dL (ref 6–20)
Calcium, Ion: 1.21 mmol/L (ref 1.15–1.40)
Calcium, Ion: 1.15 mmol/L (ref 1.15–1.40)
Calcium, Ion: 1 mmol/L — ABNORMAL LOW (ref 1.15–1.40)
Calcium, Ion: 1.01 mmol/L — ABNORMAL LOW (ref 1.15–1.40)
Calcium, Ion: 1.03 mmol/L — ABNORMAL LOW (ref 1.15–1.40)
Calcium, Ion: 1.01 mmol/L — ABNORMAL LOW (ref 1.15–1.40)
Calcium, Ion: 1.08 mmol/L — ABNORMAL LOW (ref 1.15–1.40)
Chloride: 106 mmol/L (ref 101–111)
Chloride: 107 mmol/L (ref 101–111)
Chloride: 103 mmol/L (ref 101–111)
Chloride: 101 mmol/L (ref 101–111)
Chloride: 103 mmol/L (ref 101–111)
Chloride: 103 mmol/L (ref 101–111)
Chloride: 109 mmol/L (ref 101–111)
Creatinine, Ser: 0.7 mg/dL (ref 0.44–1.00)
Creatinine, Ser: 0.6 mg/dL (ref 0.44–1.00)
Creatinine, Ser: 0.4 mg/dL — ABNORMAL LOW (ref 0.44–1.00)
Creatinine, Ser: 0.4 mg/dL — ABNORMAL LOW (ref 0.44–1.00)
Creatinine, Ser: 0.5 mg/dL (ref 0.44–1.00)
Creatinine, Ser: 0.5 mg/dL (ref 0.44–1.00)
Creatinine, Ser: 0.6 mg/dL (ref 0.44–1.00)
Glucose, Bld: 149 mg/dL — ABNORMAL HIGH (ref 65–99)
Glucose, Bld: 151 mg/dL — ABNORMAL HIGH (ref 65–99)
Glucose, Bld: 135 mg/dL — ABNORMAL HIGH (ref 65–99)
Glucose, Bld: 147 mg/dL — ABNORMAL HIGH (ref 65–99)
Glucose, Bld: 169 mg/dL — ABNORMAL HIGH (ref 65–99)
Glucose, Bld: 189 mg/dL — ABNORMAL HIGH (ref 65–99)
Glucose, Bld: 146 mg/dL — ABNORMAL HIGH (ref 65–99)
HCT: 32 % — ABNORMAL LOW (ref 36.0–46.0)
HCT: 28 % — ABNORMAL LOW (ref 36.0–46.0)
HCT: 26 % — ABNORMAL LOW (ref 36.0–46.0)
HCT: 23 % — ABNORMAL LOW (ref 36.0–46.0)
HCT: 26 % — ABNORMAL LOW (ref 36.0–46.0)
HCT: 24 % — ABNORMAL LOW (ref 36.0–46.0)
HCT: 29 % — ABNORMAL LOW (ref 36.0–46.0)
Hemoglobin: 10.9 g/dL — ABNORMAL LOW (ref 12.0–15.0)
Hemoglobin: 9.5 g/dL — ABNORMAL LOW (ref 12.0–15.0)
Hemoglobin: 8.8 g/dL — ABNORMAL LOW (ref 12.0–15.0)
Hemoglobin: 7.8 g/dL — ABNORMAL LOW (ref 12.0–15.0)
Hemoglobin: 8.8 g/dL — ABNORMAL LOW (ref 12.0–15.0)
Hemoglobin: 8.2 g/dL — ABNORMAL LOW (ref 12.0–15.0)
Hemoglobin: 9.9 g/dL — ABNORMAL LOW (ref 12.0–15.0)
Potassium: 3.6 mmol/L (ref 3.5–5.1)
Potassium: 3.7 mmol/L (ref 3.5–5.1)
Potassium: 3.8 mmol/L (ref 3.5–5.1)
Potassium: 4.2 mmol/L (ref 3.5–5.1)
Potassium: 4.2 mmol/L (ref 3.5–5.1)
Potassium: 3.8 mmol/L (ref 3.5–5.1)
Potassium: 4.1 mmol/L (ref 3.5–5.1)
Sodium: 142 mmol/L (ref 135–145)
Sodium: 140 mmol/L (ref 135–145)
Sodium: 142 mmol/L (ref 135–145)
Sodium: 137 mmol/L (ref 135–145)
Sodium: 139 mmol/L (ref 135–145)
Sodium: 140 mmol/L (ref 135–145)
Sodium: 142 mmol/L (ref 135–145)
TCO2: 24 mmol/L (ref 22–32)
TCO2: 24 mmol/L (ref 22–32)
TCO2: 27 mmol/L (ref 22–32)
TCO2: 26 mmol/L (ref 22–32)
TCO2: 25 mmol/L (ref 22–32)
TCO2: 21 mmol/L — ABNORMAL LOW (ref 22–32)
TCO2: 24 mmol/L (ref 22–32)

## 2017-08-02 LAB — URINALYSIS, ROUTINE W REFLEX MICROSCOPIC
Bilirubin Urine: NEGATIVE
Glucose, UA: NEGATIVE mg/dL
Hgb urine dipstick: NEGATIVE
Ketones, ur: NEGATIVE mg/dL
Leukocytes, UA: NEGATIVE
Nitrite: NEGATIVE
Protein, ur: NEGATIVE mg/dL
Specific Gravity, Urine: 1.008 (ref 1.005–1.030)
pH: 6 (ref 5.0–8.0)

## 2017-08-02 LAB — POCT I-STAT 3, ART BLOOD GAS (G3+)
Acid-Base Excess: 3 mmol/L — ABNORMAL HIGH (ref 0.0–2.0)
Acid-Base Excess: 1 mmol/L (ref 0.0–2.0)
Acid-base deficit: 1 mmol/L (ref 0.0–2.0)
Acid-base deficit: 1 mmol/L (ref 0.0–2.0)
Bicarbonate: 26.8 mmol/L (ref 20.0–28.0)
Bicarbonate: 25.1 mmol/L (ref 20.0–28.0)
Bicarbonate: 24.7 mmol/L (ref 20.0–28.0)
Bicarbonate: 24 mmol/L (ref 20.0–28.0)
O2 Saturation: 100 %
O2 Saturation: 100 %
O2 Saturation: 100 %
O2 Saturation: 99 %
Patient temperature: 35.1
Patient temperature: 37
TCO2: 28 mmol/L (ref 22–32)
TCO2: 26 mmol/L (ref 22–32)
TCO2: 26 mmol/L (ref 22–32)
TCO2: 25 mmol/L (ref 22–32)
pCO2 arterial: 37.6 mmHg (ref 32.0–48.0)
pCO2 arterial: 37.8 mmHg (ref 32.0–48.0)
pCO2 arterial: 40.7 mmHg (ref 32.0–48.0)
pCO2 arterial: 42.7 mmHg (ref 32.0–48.0)
pH, Arterial: 7.46 — ABNORMAL HIGH (ref 7.350–7.450)
pH, Arterial: 7.43 (ref 7.350–7.450)
pH, Arterial: 7.383 (ref 7.350–7.450)
pH, Arterial: 7.358 (ref 7.350–7.450)
pO2, Arterial: 393 mmHg — ABNORMAL HIGH (ref 83.0–108.0)
pO2, Arterial: 368 mmHg — ABNORMAL HIGH (ref 83.0–108.0)
pO2, Arterial: 205 mmHg — ABNORMAL HIGH (ref 83.0–108.0)
pO2, Arterial: 153 mmHg — ABNORMAL HIGH (ref 83.0–108.0)

## 2017-08-02 LAB — BASIC METABOLIC PANEL
Anion gap: 4 — ABNORMAL LOW (ref 5–15)
BUN: 11 mg/dL (ref 6–20)
CO2: 24 mmol/L (ref 22–32)
Calcium: 8.1 mg/dL — ABNORMAL LOW (ref 8.9–10.3)
Chloride: 110 mmol/L (ref 101–111)
Creatinine, Ser: 0.79 mg/dL (ref 0.44–1.00)
GFR calc Af Amer: >60 mL/min (ref >60)
GFR calc non Af Amer: >60 mL/min (ref >60)
Glucose, Bld: 131 mg/dL — ABNORMAL HIGH (ref 65–99)
Potassium: 3.5 mmol/L (ref 3.5–5.1)
Sodium: 138 mmol/L (ref 135–145)

## 2017-08-02 LAB — CBC
HCT: 33.3 % — ABNORMAL LOW (ref 36.0–46.0)
HCT: 29.9 % — ABNORMAL LOW (ref 36.0–46.0)
HCT: 30.9 % — ABNORMAL LOW (ref 36.0–46.0)
Hemoglobin: 11 g/dL — ABNORMAL LOW (ref 12.0–15.0)
Hemoglobin: 10 g/dL — ABNORMAL LOW (ref 12.0–15.0)
Hemoglobin: 10.2 g/dL — ABNORMAL LOW (ref 12.0–15.0)
MCH: 27.3 pg (ref 26.0–34.0)
MCH: 27.3 pg (ref 26.0–34.0)
MCH: 27 pg (ref 26.0–34.0)
MCHC: 33 g/dL (ref 30.0–36.0)
MCHC: 33.4 g/dL (ref 30.0–36.0)
MCHC: 33 g/dL (ref 30.0–36.0)
MCV: 82.6 fL (ref 78.0–100.0)
MCV: 81.7 fL (ref 78.0–100.0)
MCV: 81.7 fL (ref 78.0–100.0)
Platelets: 233 10*3/uL (ref 150–400)
Platelets: 124 10*3/uL — ABNORMAL LOW (ref 150–400)
Platelets: 133 10*3/uL — ABNORMAL LOW (ref 150–400)
RBC: 4.03 MIL/uL (ref 3.87–5.11)
RBC: 3.66 MIL/uL — ABNORMAL LOW (ref 3.87–5.11)
RBC: 3.78 MIL/uL — ABNORMAL LOW (ref 3.87–5.11)
RDW: 14.2 % (ref 11.5–15.5)
RDW: 14 % (ref 11.5–15.5)
RDW: 13.9 % (ref 11.5–15.5)
WBC: 7 10*3/uL (ref 4.0–10.5)
WBC: 13.8 10*3/uL — ABNORMAL HIGH (ref 4.0–10.5)
WBC: 14.3 10*3/uL — ABNORMAL HIGH (ref 4.0–10.5)

## 2017-08-02 LAB — POCT I-STAT 4, (NA,K, GLUC, HGB,HCT)
Glucose, Bld: 117 mg/dL — ABNORMAL HIGH (ref 65–99)
HCT: 30 % — ABNORMAL LOW (ref 36.0–46.0)
Hemoglobin: 10.2 g/dL — ABNORMAL LOW (ref 12.0–15.0)
Potassium: 3.6 mmol/L (ref 3.5–5.1)
Sodium: 144 mmol/L (ref 135–145)

## 2017-08-02 LAB — GLUCOSE, CAPILLARY
Glucose-Capillary: 108 mg/dL — ABNORMAL HIGH (ref 65–99)
Glucose-Capillary: 74 mg/dL (ref 65–99)
Glucose-Capillary: 149 mg/dL — ABNORMAL HIGH (ref 65–99)
Glucose-Capillary: 138 mg/dL — ABNORMAL HIGH (ref 65–99)
Glucose-Capillary: 168 mg/dL — ABNORMAL HIGH (ref 65–99)

## 2017-08-02 LAB — PROTIME-INR
INR: 1.7
Prothrombin Time: 19.8 seconds — ABNORMAL HIGH (ref 11.4–15.2)

## 2017-08-02 LAB — HEMOGLOBIN AND HEMATOCRIT, BLOOD
HCT: 28.7 % — ABNORMAL LOW (ref 36.0–46.0)
Hemoglobin: 9.8 g/dL — ABNORMAL LOW (ref 12.0–15.0)

## 2017-08-02 LAB — ABO/RH: ABO/RH(D): AB POS

## 2017-08-02 LAB — PLATELET COUNT: Platelets: 153 10*3/uL (ref 150–400)

## 2017-08-02 LAB — APTT: aPTT: 30 seconds (ref 24–36)

## 2017-08-02 LAB — CREATININE, SERUM
Creatinine, Ser: 0.67 mg/dL (ref 0.44–1.00)
GFR calc Af Amer: >60 mL/min (ref >60)
GFR calc non Af Amer: >60 mL/min (ref >60)

## 2017-08-02 LAB — MAGNESIUM: Magnesium: 3.3 mg/dL — ABNORMAL HIGH (ref 1.7–2.4)

## 2017-08-02 SURGERY — CORONARY ARTERY BYPASS GRAFTING (CABG)
Anesthesia: General | Site: Chest

## 2017-08-02 MED ORDER — ROCURONIUM BROMIDE 10 MG/ML (PF) SYRINGE
PREFILLED_SYRINGE | INTRAVENOUS | Status: AC
  Filled 2017-08-02: qty 5

## 2017-08-02 MED ORDER — SODIUM CHLORIDE 0.9% FLUSH
3.0000 mL | Freq: Two times a day (BID) | INTRAVENOUS | Status: DC
  Administered 2017-08-03 (×2): 3 mL via INTRAVENOUS

## 2017-08-02 MED ORDER — ACETAMINOPHEN 650 MG RE SUPP
650.0000 mg | Freq: Once | RECTAL | Status: AC
  Administered 2017-08-02: 650 mg via RECTAL

## 2017-08-02 MED ORDER — NITROGLYCERIN IN D5W 200-5 MCG/ML-% IV SOLN: 0.0000 ug/min | INTRAVENOUS | Status: DC

## 2017-08-02 MED ORDER — SODIUM CHLORIDE 0.9 % IV SOLN
INTRAVENOUS | Status: DC
  Administered 2017-08-03: 3.7 [IU]/h via INTRAVENOUS
  Filled 2017-08-02 (×2): qty 1

## 2017-08-02 MED ORDER — CHLORHEXIDINE GLUCONATE 0.12 % MT SOLN
15.0000 mL | OROMUCOSAL | Status: AC
  Administered 2017-08-02: 15 mL via OROMUCOSAL

## 2017-08-02 MED ORDER — BISACODYL 10 MG RE SUPP: 10.0000 mg | Freq: Every day | RECTAL | Status: DC

## 2017-08-02 MED ORDER — INSULIN REGULAR BOLUS VIA INFUSION
0.0000 [IU] | Freq: Three times a day (TID) | INTRAVENOUS | Status: DC
  Filled 2017-08-02: qty 10

## 2017-08-02 MED ORDER — SODIUM CHLORIDE 0.9 % IV SOLN
INTRAVENOUS | Status: DC
  Administered 2017-08-02: 16:00:00 via INTRAVENOUS

## 2017-08-02 MED ORDER — FENTANYL CITRATE (PF) 250 MCG/5ML IJ SOLN
INTRAMUSCULAR | Status: AC
  Filled 2017-08-02: qty 25

## 2017-08-02 MED ORDER — SODIUM CHLORIDE 0.9 % IJ SOLN
OROMUCOSAL | Status: DC | PRN
  Administered 2017-08-02 (×3): via TOPICAL

## 2017-08-02 MED ORDER — METOPROLOL TARTRATE 25 MG/10 ML ORAL SUSPENSION: 12.5000 mg | Freq: Two times a day (BID) | ORAL | Status: DC

## 2017-08-02 MED ORDER — EPHEDRINE 5 MG/ML INJ
INTRAVENOUS | Status: AC
  Filled 2017-08-02: qty 10

## 2017-08-02 MED ORDER — PHENYLEPHRINE 40 MCG/ML (10ML) SYRINGE FOR IV PUSH (FOR BLOOD PRESSURE SUPPORT)
PREFILLED_SYRINGE | INTRAVENOUS | Status: DC | PRN
  Administered 2017-08-02 (×3): 20 ug via INTRAVENOUS
  Administered 2017-08-02: 80 ug via INTRAVENOUS

## 2017-08-02 MED ORDER — PHENYLEPHRINE 40 MCG/ML (10ML) SYRINGE FOR IV PUSH (FOR BLOOD PRESSURE SUPPORT)
PREFILLED_SYRINGE | INTRAVENOUS | Status: AC
  Filled 2017-08-02: qty 10

## 2017-08-02 MED ORDER — FAMOTIDINE IN NACL 20-0.9 MG/50ML-% IV SOLN
20.0000 mg | Freq: Two times a day (BID) | INTRAVENOUS | Status: AC
  Administered 2017-08-02 (×2): 20 mg via INTRAVENOUS
  Filled 2017-08-02: qty 50

## 2017-08-02 MED ORDER — DOCUSATE SODIUM 100 MG PO CAPS
200.0000 mg | ORAL_CAPSULE | Freq: Every day | ORAL | Status: DC
  Administered 2017-08-03 – 2017-08-05 (×3): 200 mg via ORAL
  Filled 2017-08-02 (×4): qty 2

## 2017-08-02 MED ORDER — PROTAMINE SULFATE 10 MG/ML IV SOLN
INTRAVENOUS | Status: AC
  Filled 2017-08-02: qty 25

## 2017-08-02 MED ORDER — HEPARIN SODIUM (PORCINE) 1000 UNIT/ML IJ SOLN
INTRAMUSCULAR | Status: AC
  Filled 2017-08-02: qty 1

## 2017-08-02 MED ORDER — LACTATED RINGERS IV SOLN: 500.0000 mL | Freq: Once | INTRAVENOUS | Status: DC | PRN

## 2017-08-02 MED ORDER — PROPOFOL 10 MG/ML IV BOLUS
INTRAVENOUS | Status: DC | PRN
  Administered 2017-08-02: 150 mg via INTRAVENOUS

## 2017-08-02 MED ORDER — SODIUM CHLORIDE 0.9 % IV SOLN
250.0000 mL | INTRAVENOUS | Status: DC
  Administered 2017-08-03: 250 mL via INTRAVENOUS

## 2017-08-02 MED ORDER — POTASSIUM CHLORIDE 10 MEQ/50ML IV SOLN
10.0000 meq | INTRAVENOUS | Status: AC
  Administered 2017-08-02 (×3): 10 meq via INTRAVENOUS

## 2017-08-02 MED ORDER — TRAMADOL HCL 50 MG PO TABS
50.0000 mg | ORAL_TABLET | ORAL | Status: DC | PRN
  Administered 2017-08-03 (×2): 100 mg via ORAL
  Administered 2017-08-04: 50 mg via ORAL
  Administered 2017-08-04 – 2017-08-06 (×3): 100 mg via ORAL
  Administered 2017-08-06: 50 mg via ORAL
  Administered 2017-08-07: 100 mg via ORAL
  Filled 2017-08-02 (×2): qty 2
  Filled 2017-08-02: qty 1
  Filled 2017-08-02 (×3): qty 2
  Filled 2017-08-02: qty 1
  Filled 2017-08-02: qty 2

## 2017-08-02 MED ORDER — SODIUM CHLORIDE 0.9 % IV SOLN
INTRAVENOUS | Status: DC | PRN
  Administered 2017-08-02: 14:00:00 via INTRAVENOUS

## 2017-08-02 MED ORDER — OXYCODONE HCL 5 MG PO TABS
5.0000 mg | ORAL_TABLET | ORAL | Status: DC | PRN
  Administered 2017-08-06 (×3): 10 mg via ORAL
  Administered 2017-08-07 (×3): 5 mg via ORAL
  Administered 2017-08-07: 10 mg via ORAL
  Administered 2017-08-07: 5 mg via ORAL
  Filled 2017-08-02 (×2): qty 1
  Filled 2017-08-02 (×3): qty 2
  Filled 2017-08-02: qty 1
  Filled 2017-08-02: qty 2
  Filled 2017-08-02: qty 1

## 2017-08-02 MED ORDER — SODIUM CHLORIDE 0.9 % IV SOLN
0.0000 ug/min | INTRAVENOUS | Status: DC
  Filled 2017-08-02 (×2): qty 2

## 2017-08-02 MED ORDER — ACETAMINOPHEN 160 MG/5ML PO SOLN
1000.0000 mg | Freq: Four times a day (QID) | ORAL | Status: AC
  Administered 2017-08-02: 1000 mg
  Filled 2017-08-02: qty 40.6

## 2017-08-02 MED ORDER — LACTATED RINGERS IV SOLN: INTRAVENOUS | Status: DC

## 2017-08-02 MED ORDER — SODIUM CHLORIDE 0.45 % IV SOLN
INTRAVENOUS | Status: DC | PRN
  Administered 2017-08-02: 16:00:00 via INTRAVENOUS

## 2017-08-02 MED ORDER — MORPHINE SULFATE (PF) 4 MG/ML IV SOLN
1.0000 mg | INTRAVENOUS | Status: AC | PRN
  Administered 2017-08-03: 2 mg via INTRAVENOUS
  Filled 2017-08-02: qty 1

## 2017-08-02 MED ORDER — ARTIFICIAL TEARS OPHTHALMIC OINT
TOPICAL_OINTMENT | OPHTHALMIC | Status: AC
  Filled 2017-08-02: qty 3.5

## 2017-08-02 MED ORDER — LIDOCAINE 2% (20 MG/ML) 5 ML SYRINGE
INTRAMUSCULAR | Status: AC
  Filled 2017-08-02: qty 5

## 2017-08-02 MED ORDER — THROMBIN (RECOMBINANT) 5000 UNITS EX SOLR
CUTANEOUS | Status: AC
  Filled 2017-08-02: qty 5000

## 2017-08-02 MED ORDER — LACTATED RINGERS IV SOLN
INTRAVENOUS | Status: DC | PRN
  Administered 2017-08-02: 07:00:00 via INTRAVENOUS

## 2017-08-02 MED ORDER — MIDAZOLAM HCL 2 MG/2ML IJ SOLN: 2.0000 mg | INTRAMUSCULAR | Status: DC | PRN

## 2017-08-02 MED ORDER — CHLORHEXIDINE GLUCONATE 0.12% ORAL RINSE (MEDLINE KIT)
15.0000 mL | Freq: Two times a day (BID) | OROMUCOSAL | Status: DC
  Administered 2017-08-02 – 2017-08-03 (×2): 15 mL via OROMUCOSAL

## 2017-08-02 MED ORDER — MIDAZOLAM HCL 5 MG/5ML IJ SOLN
INTRAMUSCULAR | Status: DC | PRN
  Administered 2017-08-02: 1 mg via INTRAVENOUS
  Administered 2017-08-02: 2 mg via INTRAVENOUS
  Administered 2017-08-02 (×2): 3 mg via INTRAVENOUS
  Administered 2017-08-02: 2 mg via INTRAVENOUS
  Administered 2017-08-02: 1 mg via INTRAVENOUS

## 2017-08-02 MED ORDER — ORAL CARE MOUTH RINSE
15.0000 mL | Freq: Four times a day (QID) | OROMUCOSAL | Status: DC
  Administered 2017-08-03 (×5): 15 mL via OROMUCOSAL

## 2017-08-02 MED ORDER — LIDOCAINE 2% (20 MG/ML) 5 ML SYRINGE
INTRAMUSCULAR | Status: DC | PRN
  Administered 2017-08-02: 100 mg via INTRAVENOUS

## 2017-08-02 MED ORDER — PROTAMINE SULFATE 10 MG/ML IV SOLN
INTRAVENOUS | Status: AC
  Filled 2017-08-02: qty 5

## 2017-08-02 MED ORDER — MIDAZOLAM HCL 2 MG/2ML IJ SOLN
INTRAMUSCULAR | Status: AC
  Filled 2017-08-02: qty 2

## 2017-08-02 MED ORDER — MIDAZOLAM HCL 10 MG/2ML IJ SOLN
INTRAMUSCULAR | Status: AC
  Filled 2017-08-02: qty 2

## 2017-08-02 MED ORDER — ALBUMIN HUMAN 5 % IV SOLN
250.0000 mL | INTRAVENOUS | Status: AC | PRN
  Administered 2017-08-02 – 2017-08-03 (×4): 250 mL via INTRAVENOUS
  Filled 2017-08-02 (×2): qty 250

## 2017-08-02 MED ORDER — PANTOPRAZOLE SODIUM 40 MG PO TBEC
40.0000 mg | DELAYED_RELEASE_TABLET | Freq: Every day | ORAL | Status: DC
  Administered 2017-08-03 – 2017-08-09 (×7): 40 mg via ORAL
  Filled 2017-08-02 (×7): qty 1

## 2017-08-02 MED ORDER — DEXMEDETOMIDINE HCL IN NACL 400 MCG/100ML IV SOLN
0.0000 ug/kg/h | INTRAVENOUS | Status: DC
  Administered 2017-08-02: 0.2 ug/kg/h via INTRAVENOUS
  Administered 2017-08-02: 0.5 ug/kg/h via INTRAVENOUS
  Filled 2017-08-02: qty 100

## 2017-08-02 MED ORDER — ACETAMINOPHEN 160 MG/5ML PO SOLN: 650.0000 mg | Freq: Once | ORAL | Status: AC

## 2017-08-02 MED ORDER — HEMOSTATIC AGENTS (NO CHARGE) OPTIME
TOPICAL | Status: DC | PRN
  Administered 2017-08-02: 1 via TOPICAL

## 2017-08-02 MED ORDER — ROCURONIUM BROMIDE 10 MG/ML (PF) SYRINGE
PREFILLED_SYRINGE | INTRAVENOUS | Status: DC | PRN
  Administered 2017-08-02 (×3): 50 mg via INTRAVENOUS
  Administered 2017-08-02: 100 mg via INTRAVENOUS
  Administered 2017-08-02: 50 mg via INTRAVENOUS

## 2017-08-02 MED ORDER — ALBUMIN HUMAN 5 % IV SOLN
INTRAVENOUS | Status: DC | PRN
  Administered 2017-08-02 (×3): via INTRAVENOUS

## 2017-08-02 MED ORDER — ARTIFICIAL TEARS OPHTHALMIC OINT
TOPICAL_OINTMENT | OPHTHALMIC | Status: DC | PRN
  Administered 2017-08-02: 1 via OPHTHALMIC

## 2017-08-02 MED ORDER — MORPHINE SULFATE (PF) 2 MG/ML IV SOLN: 1.0000 mg | INTRAVENOUS | Status: DC | PRN

## 2017-08-02 MED ORDER — PROTAMINE SULFATE 10 MG/ML IV SOLN
INTRAVENOUS | Status: AC
  Filled 2017-08-02: qty 15

## 2017-08-02 MED ORDER — TRANEXAMIC ACID 1000 MG/10ML IV SOLN
1.5000 mg/kg/h | INTRAVENOUS | Status: DC
  Filled 2017-08-02: qty 10

## 2017-08-02 MED ORDER — DEXMEDETOMIDINE HCL IN NACL 200 MCG/50ML IV SOLN
INTRAVENOUS | Status: AC
Start: 2017-08-02 — End: 2017-08-02
  Filled 2017-08-02: qty 50

## 2017-08-02 MED ORDER — ONDANSETRON HCL 4 MG/2ML IJ SOLN
4.0000 mg | Freq: Four times a day (QID) | INTRAMUSCULAR | Status: DC | PRN
  Administered 2017-08-03 – 2017-08-04 (×2): 4 mg via INTRAVENOUS
  Filled 2017-08-02 (×2): qty 2

## 2017-08-02 MED ORDER — ASPIRIN EC 325 MG PO TBEC
325.0000 mg | DELAYED_RELEASE_TABLET | Freq: Every day | ORAL | Status: DC
  Administered 2017-08-03 – 2017-08-05 (×3): 325 mg via ORAL
  Filled 2017-08-02 (×3): qty 1

## 2017-08-02 MED ORDER — DEXTROSE 5 % IV SOLN
1.5000 g | Freq: Two times a day (BID) | INTRAVENOUS | Status: AC
  Administered 2017-08-02 – 2017-08-04 (×4): 1.5 g via INTRAVENOUS
  Filled 2017-08-02 (×4): qty 1.5

## 2017-08-02 MED ORDER — PROTAMINE SULFATE 10 MG/ML IV SOLN
INTRAVENOUS | Status: DC | PRN
Start: 2017-08-02 — End: 2017-08-02
  Administered 2017-08-02: 370 mg via INTRAVENOUS
  Administered 2017-08-02: 25 mg via INTRAVENOUS
  Administered 2017-08-02: 30 mg via INTRAVENOUS

## 2017-08-02 MED ORDER — 0.9 % SODIUM CHLORIDE (POUR BTL) OPTIME
TOPICAL | Status: DC | PRN
  Administered 2017-08-02: 6000 mL

## 2017-08-02 MED ORDER — MORPHINE SULFATE (PF) 4 MG/ML IV SOLN
2.0000 mg | INTRAVENOUS | Status: DC | PRN
  Administered 2017-08-03 (×2): 2 mg via INTRAVENOUS
  Administered 2017-08-03: 4 mg via INTRAVENOUS
  Filled 2017-08-02 (×4): qty 1

## 2017-08-02 MED ORDER — METOPROLOL TARTRATE 5 MG/5ML IV SOLN: 2.5000 mg | INTRAVENOUS | Status: DC | PRN

## 2017-08-02 MED ORDER — ASPIRIN 81 MG PO CHEW: 324.0000 mg | CHEWABLE_TABLET | Freq: Every day | ORAL | Status: DC

## 2017-08-02 MED ORDER — PROPOFOL 10 MG/ML IV BOLUS
INTRAVENOUS | Status: AC
  Filled 2017-08-02: qty 20

## 2017-08-02 MED ORDER — METOPROLOL TARTRATE 12.5 MG HALF TABLET
12.5000 mg | ORAL_TABLET | Freq: Two times a day (BID) | ORAL | Status: DC
Start: 2017-08-02 — End: 2017-08-08
  Administered 2017-08-04 – 2017-08-07 (×8): 12.5 mg via ORAL
  Filled 2017-08-02 (×9): qty 1

## 2017-08-02 MED ORDER — SUCCINYLCHOLINE CHLORIDE 200 MG/10ML IV SOSY
PREFILLED_SYRINGE | INTRAVENOUS | Status: AC
  Filled 2017-08-02: qty 10

## 2017-08-02 MED ORDER — HEPARIN SODIUM (PORCINE) 1000 UNIT/ML IJ SOLN
INTRAMUSCULAR | Status: DC | PRN
  Administered 2017-08-02: 37000 [IU] via INTRAVENOUS
  Administered 2017-08-02: 3000 [IU] via INTRAVENOUS

## 2017-08-02 MED ORDER — VANCOMYCIN HCL IN DEXTROSE 1-5 GM/200ML-% IV SOLN
1000.0000 mg | Freq: Once | INTRAVENOUS | Status: AC
  Administered 2017-08-02: 1000 mg via INTRAVENOUS
  Filled 2017-08-02: qty 200

## 2017-08-02 MED ORDER — THROMBIN (RECOMBINANT) 5000 UNITS EX SOLR
CUTANEOUS | Status: DC | PRN
  Administered 2017-08-02: 5000 [IU] via TOPICAL

## 2017-08-02 MED ORDER — FENTANYL CITRATE (PF) 100 MCG/2ML IJ SOLN
INTRAMUSCULAR | Status: DC | PRN
  Administered 2017-08-02: 200 ug via INTRAVENOUS
  Administered 2017-08-02: 100 ug via INTRAVENOUS
  Administered 2017-08-02: 250 ug via INTRAVENOUS
  Administered 2017-08-02: 150 ug via INTRAVENOUS
  Administered 2017-08-02: 50 ug via INTRAVENOUS
  Administered 2017-08-02 (×2): 250 ug via INTRAVENOUS
  Administered 2017-08-02: 100 ug via INTRAVENOUS
  Administered 2017-08-02: 150 ug via INTRAVENOUS

## 2017-08-02 MED ORDER — DOPAMINE-DEXTROSE 3.2-5 MG/ML-% IV SOLN
0.0000 ug/kg/min | INTRAVENOUS | Status: DC
  Administered 2017-08-02 – 2017-08-03 (×2): 3 ug/kg/min via INTRAVENOUS
  Filled 2017-08-02: qty 250

## 2017-08-02 MED ORDER — ISOSORBIDE MONONITRATE ER 30 MG PO TB24
15.0000 mg | ORAL_TABLET | Freq: Every day | ORAL | Status: DC
  Administered 2017-08-03: 15 mg via ORAL
  Filled 2017-08-02 (×2): qty 1

## 2017-08-02 MED ORDER — LACTATED RINGERS IV SOLN
INTRAVENOUS | Status: DC | PRN
  Administered 2017-08-02 (×2): via INTRAVENOUS

## 2017-08-02 MED ORDER — FENTANYL CITRATE (PF) 250 MCG/5ML IJ SOLN
INTRAMUSCULAR | Status: AC
  Filled 2017-08-02: qty 5

## 2017-08-02 MED ORDER — BISACODYL 5 MG PO TBEC
10.0000 mg | DELAYED_RELEASE_TABLET | Freq: Every day | ORAL | Status: DC
  Administered 2017-08-03 – 2017-08-05 (×3): 10 mg via ORAL
  Filled 2017-08-02 (×4): qty 2

## 2017-08-02 MED ORDER — MAGNESIUM SULFATE 4 GM/100ML IV SOLN
4.0000 g | Freq: Once | INTRAVENOUS | Status: AC
  Administered 2017-08-02: 4 g via INTRAVENOUS
  Filled 2017-08-02: qty 100

## 2017-08-02 MED ORDER — ACETAMINOPHEN 500 MG PO TABS
1000.0000 mg | ORAL_TABLET | Freq: Four times a day (QID) | ORAL | Status: AC
  Administered 2017-08-03 – 2017-08-07 (×18): 1000 mg via ORAL
  Filled 2017-08-02 (×19): qty 2

## 2017-08-02 MED ORDER — PHENYLEPHRINE HCL 10 MG/ML IJ SOLN
INTRAVENOUS | Status: DC | PRN
  Administered 2017-08-02: 20 ug/min via INTRAVENOUS

## 2017-08-02 MED ORDER — MORPHINE SULFATE (PF) 2 MG/ML IV SOLN: 2.0000 mg | INTRAVENOUS | Status: DC | PRN

## 2017-08-02 MED ORDER — SODIUM CHLORIDE 0.9% FLUSH: 3.0000 mL | INTRAVENOUS | Status: DC | PRN

## 2017-08-02 SURGICAL SUPPLY — 118 items
ADH SKN CLS APL DERMABOND .7 (GAUZE/BANDAGES/DRESSINGS) ×3
APPLIER CLIP 9.375 SM OPEN (CLIP)
APR CLP SM 9.3 20 MLT OPN (CLIP)
BAG DECANTER FOR FLEXI CONT (MISCELLANEOUS) ×4 IMPLANT
BANDAGE ACE 4X5 VEL STRL LF (GAUZE/BANDAGES/DRESSINGS) ×5 IMPLANT
BANDAGE ACE 6X5 VEL STRL LF (GAUZE/BANDAGES/DRESSINGS) ×4 IMPLANT
BATTERY MAXDRIVER (MISCELLANEOUS) ×1 IMPLANT
BLADE NDL 3 SS STRL (BLADE) IMPLANT
BLADE NEEDLE 3 SS STRL (BLADE) ×4 IMPLANT
BLADE STERNUM SYSTEM 6 (BLADE) ×4 IMPLANT
BLADE SURG 11 STRL SS (BLADE) ×1 IMPLANT
BLADE SURG 15 STRL LF DISP TIS (BLADE) IMPLANT
BLADE SURG 15 STRL SS (BLADE) ×4
BNDG GAUZE ELAST 4 BULKY (GAUZE/BANDAGES/DRESSINGS) ×5 IMPLANT
CANISTER SUCT 3000ML PPV (MISCELLANEOUS) ×4 IMPLANT
CANNULA VESSEL 3MM 2 BLNT TIP (CANNULA) ×2 IMPLANT
CATH CPB KIT GERHARDT (MISCELLANEOUS) ×4 IMPLANT
CATH THORACIC 28FR (CATHETERS) ×4 IMPLANT
CLIP APPLIE 9.375 SM OPEN (CLIP) IMPLANT
CLIP FOGARTY SPRING 6M (CLIP) ×4 IMPLANT
CLIP VESOCCLUDE MED 24/CT (CLIP) ×1 IMPLANT
CLIP VESOCCLUDE SM WIDE 24/CT (CLIP) ×5 IMPLANT
COVER MAYO STAND STRL (DRAPES) IMPLANT
CRADLE DONUT ADULT HEAD (MISCELLANEOUS) ×4 IMPLANT
DERMABOND ADVANCED (GAUZE/BANDAGES/DRESSINGS) ×1
DERMABOND ADVANCED .7 DNX12 (GAUZE/BANDAGES/DRESSINGS) IMPLANT
DRAIN CHANNEL 28F RND 3/8 FF (WOUND CARE) ×4 IMPLANT
DRAPE CARDIOVASCULAR INCISE (DRAPES) ×4
DRAPE HALF SHEET 40X57 (DRAPES) IMPLANT
DRAPE SLUSH/WARMER DISC (DRAPES) ×4 IMPLANT
DRAPE SRG 135X102X78XABS (DRAPES) ×3 IMPLANT
DRSG AQUACEL AG ADV 3.5X14 (GAUZE/BANDAGES/DRESSINGS) ×4 IMPLANT
ELECT BLADE 4.0 EZ CLEAN MEGAD (MISCELLANEOUS) ×4
ELECT REM PT RETURN 9FT ADLT (ELECTROSURGICAL) ×8
ELECTRODE BLDE 4.0 EZ CLN MEGD (MISCELLANEOUS) ×3 IMPLANT
ELECTRODE REM PT RTRN 9FT ADLT (ELECTROSURGICAL) ×6 IMPLANT
FELT TEFLON 1X6 (MISCELLANEOUS) ×8 IMPLANT
GAUZE SPONGE 4X4 12PLY STRL (GAUZE/BANDAGES/DRESSINGS) ×8 IMPLANT
GAUZE SPONGE 4X4 12PLY STRL LF (GAUZE/BANDAGES/DRESSINGS) ×3 IMPLANT
GEL ULTRASOUND 20GR AQUASONIC (MISCELLANEOUS) IMPLANT
GLOVE BIO SURGEON STRL SZ 6.5 (GLOVE) ×12 IMPLANT
GLOVE BIOGEL PI IND STRL 6.5 (GLOVE) IMPLANT
GLOVE BIOGEL PI INDICATOR 6.5 (GLOVE) ×1
GOWN STRL REUS W/ TWL LRG LVL3 (GOWN DISPOSABLE) ×12 IMPLANT
GOWN STRL REUS W/TWL LRG LVL3 (GOWN DISPOSABLE) ×16
HARMONIC SHEARS 14CM COAG (MISCELLANEOUS) ×4 IMPLANT
HEMOSTAT POWDER SURGIFOAM 1G (HEMOSTASIS) ×12 IMPLANT
HEMOSTAT SURGICEL 2X14 (HEMOSTASIS) ×4 IMPLANT
KIT BASIN OR (CUSTOM PROCEDURE TRAY) ×4 IMPLANT
KIT CATH SUCT 8FR (CATHETERS) ×4 IMPLANT
KIT ROOM TURNOVER OR (KITS) ×4 IMPLANT
KIT SUCTION CATH 14FR (SUCTIONS) ×8 IMPLANT
KIT VASOVIEW HEMOPRO VH 3000 (KITS) ×4 IMPLANT
LEAD PACING MYOCARDI (MISCELLANEOUS) ×4 IMPLANT
MARKER GRAFT CORONARY BYPASS (MISCELLANEOUS) ×12 IMPLANT
MATRIX HEMOSTAT SURGIFLO (HEMOSTASIS) ×1 IMPLANT
NDL 18GX1X1/2 (RX/OR ONLY) (NEEDLE) IMPLANT
NEEDLE 18GX1X1/2 (RX/OR ONLY) (NEEDLE) ×28 IMPLANT
NS IRRIG 1000ML POUR BTL (IV SOLUTION) ×20 IMPLANT
PACK OPEN HEART (CUSTOM PROCEDURE TRAY) ×4 IMPLANT
PAD ARMBOARD 7.5X6 YLW CONV (MISCELLANEOUS) ×8 IMPLANT
PAD ELECT DEFIB RADIOL ZOLL (MISCELLANEOUS) ×4 IMPLANT
PENCIL BUTTON HOLSTER BLD 10FT (ELECTRODE) ×4 IMPLANT
PLATE STERNAL 2.3X208 14H 2-PK (Plate) ×1 IMPLANT
PUNCH AORTIC ROT 4.0MM RCL 40 (MISCELLANEOUS) ×1 IMPLANT
SCREW BONE LOCKING 2.3X9 (Screw) ×16 IMPLANT
SCREW LOCKING TI 2.3X11MM (Screw) ×4 IMPLANT
SENSOR MYOCARDIAL TEMP (MISCELLANEOUS) ×1 IMPLANT
SET CARDIOPLEGIA MPS 5001102 (MISCELLANEOUS) ×1 IMPLANT
SHEARS HARMONIC 9CM CVD (BLADE) ×4 IMPLANT
SPONGE LAP 18X18 X RAY DECT (DISPOSABLE) ×5 IMPLANT
SPONGE LAP 4X18 X RAY DECT (DISPOSABLE) ×2 IMPLANT
SUT BONE WAX W31G (SUTURE) ×4 IMPLANT
SUT ETHIBOND 2 0 SH (SUTURE) ×16
SUT ETHIBOND 2 0 SH 36X2 (SUTURE) IMPLANT
SUT ETHILON 3 0 FSL (SUTURE) ×2 IMPLANT
SUT MNCRL AB 4-0 PS2 18 (SUTURE) ×1 IMPLANT
SUT PROLENE 3 0 SH1 36 (SUTURE) ×5 IMPLANT
SUT PROLENE 4 0 RB 1 (SUTURE) ×4
SUT PROLENE 4 0 TF (SUTURE) ×8 IMPLANT
SUT PROLENE 4-0 RB1 .5 CRCL 36 (SUTURE) IMPLANT
SUT PROLENE 5 0 C 1 36 (SUTURE) ×2 IMPLANT
SUT PROLENE 6 0 C 1 30 (SUTURE) ×3 IMPLANT
SUT PROLENE 6 0 CC (SUTURE) ×11 IMPLANT
SUT PROLENE 7 0 BV1 MDA (SUTURE) ×6 IMPLANT
SUT PROLENE 8 0 BV175 6 (SUTURE) ×12 IMPLANT
SUT SILK  1 MH (SUTURE) ×3
SUT SILK 1 MH (SUTURE) IMPLANT
SUT SILK 1 TIES 10X30 (SUTURE) ×1 IMPLANT
SUT SILK 2 0 SH CR/8 (SUTURE) ×2 IMPLANT
SUT SILK 2 0 TIES 10X30 (SUTURE) ×2 IMPLANT
SUT SILK 2 0 TIES 17X18 (SUTURE) ×4
SUT SILK 2-0 18XBRD TIE BLK (SUTURE) IMPLANT
SUT SILK 3 0 SH CR/8 (SUTURE) ×1 IMPLANT
SUT SILK 4 0 TIE 10X30 (SUTURE) ×3 IMPLANT
SUT STEEL 6MS V (SUTURE) ×4 IMPLANT
SUT STEEL SZ 6 DBL 3X14 BALL (SUTURE) ×4 IMPLANT
SUT TEM PAC WIRE 2 0 SH (SUTURE) ×4 IMPLANT
SUT VIC AB 1 CTX 18 (SUTURE) ×9 IMPLANT
SUT VIC AB 2-0 CT1 27 (SUTURE) ×12
SUT VIC AB 2-0 CT1 TAPERPNT 27 (SUTURE) IMPLANT
SUT VIC AB 2-0 CTX 27 (SUTURE) ×3 IMPLANT
SUT VIC AB 3-0 SH 27 (SUTURE)
SUT VIC AB 3-0 SH 27X BRD (SUTURE) IMPLANT
SUT VIC AB 3-0 X1 27 (SUTURE) ×2 IMPLANT
SUTURE E-PAK OPEN HEART (SUTURE) ×4 IMPLANT
SYR 50ML SLIP (SYRINGE) IMPLANT
SYSTEM SAHARA CHEST DRAIN ATS (WOUND CARE) ×4 IMPLANT
TAPE CLOTH SURG 4X10 WHT LF (GAUZE/BANDAGES/DRESSINGS) ×1 IMPLANT
TAPE PAPER 2X10 WHT MICROPORE (GAUZE/BANDAGES/DRESSINGS) ×1 IMPLANT
TOWEL GREEN STERILE (TOWEL DISPOSABLE) ×16 IMPLANT
TOWEL GREEN STERILE FF (TOWEL DISPOSABLE) ×8 IMPLANT
TOWEL OR 17X24 6PK STRL BLUE (TOWEL DISPOSABLE) ×8 IMPLANT
TOWEL OR 17X26 10 PK STRL BLUE (TOWEL DISPOSABLE) ×8 IMPLANT
TRAY FOLEY SILVER 16FR TEMP (SET/KITS/TRAYS/PACK) ×4 IMPLANT
TUBING INSUFFLATION (TUBING) ×4 IMPLANT
UNDERPAD 30X30 (UNDERPADS AND DIAPERS) ×4 IMPLANT
WATER STERILE IRR 1000ML POUR (IV SOLUTION) ×8 IMPLANT

## 2017-08-02 NOTE — Progress Notes (Signed)
Patient ID: [name], female   DOB: 02-Feb-1981, 36 y.o.   MRN: [id number]  SICU Evening Rounds:   Hemodynamically stable  CI = 2.0 on dop 3  Has started to wake up on vent.  Urine output good  CT output low  CBC    Component Value Date/Time   WBC 13.8 (H) 08/02/2017 1531   RBC 3.66 (L) 08/02/2017 1531   HGB 10.2 (L) 08/02/2017 1538   HCT 30.0 (L) 08/02/2017 1538   PLT 124 (L) 08/02/2017 1531   MCV 81.7 08/02/2017 1531   MCH 27.3 08/02/2017 1531   MCHC 33.4 08/02/2017 1531   RDW 14.0 08/02/2017 1531   LYMPHSABS 1.8 07/24/2017 0518   MONOABS 0.7 07/24/2017 0518   EOSABS 0.1 07/24/2017 0518   BASOSABS 0.0 07/24/2017 0518     BMET    Component Value Date/Time   NA 144 08/02/2017 1538   K 3.6 08/02/2017 1538   CL 103 08/02/2017 1334   CO2 24 08/02/2017 0239   GLUCOSE 117 (H) 08/02/2017 1538   BUN 7 08/02/2017 1334   CREATININE 0.50 08/02/2017 1334   CALCIUM 8.1 (L) 08/02/2017 0239   GFRNONAA >60 08/02/2017 0239   GFRAA >60 08/02/2017 0239     A/P:  Stable postop course. Continue current plans

## 2017-08-02 NOTE — Anesthesia Postprocedure Evaluation (Signed)
Anesthesia Post Note  Patient: [name]  Procedure(s) Performed: CORONARY ARTERY BYPASS GRAFTING (CABG) X 5 (LIMA to LAD, LEFT RADIAL ARTERY to DIAGONAL, SVG to SEQUENTIALLY PDA and PLB) , USING LEFT INTERNAL MAMMARY ARTERY, LEFT RADIAL ARTERY, AND  SAPHENOUS VEIN to CIRCUMFLEX, RIGHT GREATER SAPHENOUS VEIN HARVESTED ENDOSCOPICALLY (N/A Chest) TRANSESOPHAGEAL ECHOCARDIOGRAM (TEE) (N/A ) LEFT RADIAL ARTERY HARVEST (Left ) STERNAL PLATING (N/A )     Patient location during evaluation: SICU Anesthesia Type: General Level of consciousness: sedated Pain management: pain level controlled Vital Signs Assessment: post-procedure vital signs reviewed and stable Respiratory status: patient remains intubated per anesthesia plan Cardiovascular status: stable Postop Assessment: no apparent nausea or vomiting Anesthetic complications: no    Last Vitals:  Vitals:   08/02/17 1600 08/02/17 1615  BP:    Pulse: 89 89  Resp: (!) 0 (!) 0  Temp: (!) 34.9 C (!) 35.1 C  SpO2: 100% 100%    Last Pain:  Vitals:   08/02/17 0600  TempSrc: Oral  PainSc:                  Chozen Latulippe DANIEL

## 2017-08-02 NOTE — Anesthesia Procedure Notes (Signed)
Procedure Name: Intubation Date/Time: 08/02/2017 7:46 AM Performed by: Harden Mo, CRNA Pre-anesthesia Checklist: Patient identified, Emergency Drugs available, Suction available and Patient being monitored Patient Re-evaluated:Patient Re-evaluated prior to induction Oxygen Delivery Method: Circle System Utilized Preoxygenation: Pre-oxygenation with 100% oxygen Induction Type: IV induction Ventilation: Mask ventilation without difficulty Laryngoscope Size: Miller and 2 Grade View: Grade I Tube type: Oral Tube size: 8.0 mm Number of attempts: 1 Airway Equipment and Method: Stylet and Oral airway Placement Confirmation: ETT inserted through vocal cords under direct vision,  positive ETCO2 and breath sounds checked- equal and bilateral Secured at: 22 cm Tube secured with: Tape Dental Injury: Teeth and Oropharynx as per pre-operative assessment

## 2017-08-02 NOTE — Brief Op Note (Signed)
07/24/2017 - 08/02/2017  12:30 PM  PATIENT:  [name]  36 y.o. female  PRE-OPERATIVE DIAGNOSIS:  1. S/p STEMI (s/p stent to RCA) 2. Coronary artery disease  POST-OPERATIVE DIAGNOSIS:  1. S/p STEMI (s/p stent to RCA) 2. Coronary artery disease   PROCEDURE:  TRANSESOPHAGEAL ECHOCARDIOGRAM (TEE), MEDIAN STERNOTOMY for CORONARY ARTERY BYPASS GRAFTING (CABG) X 5 (LIMA to LAD, LEFT RADIAL ARTERY to DIAGONAL, SVG to SEQUENTIALLY PDA and PLB) , USING LEFT INTERNAL MAMMARY ARTERY, LEFT RADIAL ARTERY, AND  SAPHENOUS VEIN to [place], RIGHT GREATER SAPHENOUS VEIN HARVESTED ENDOSCOPICALLY and OPEN LEFT RADIAL ARTERY HARVEST   SURGEON:  Surgeon(s) and Role:    * Grace Isaac, MD - Primary  PHYSICIAN ASSISTANT: Lars Pinks PA-C  ASSISTANTS: Vernie Murders RNFA  ANESTHESIA:   general  DRAINS: Chest tubes placed in the mediastinal and pleural spaces   COUNTS CORRECT :  YES   DICTATION: .Dragon Dictation  PLAN OF CARE: Admit to inpatient   PATIENT DISPOSITION:  ICU - intubated and hemodynamically stable.   Delay start of Pharmacological VTE agent (>24hrs) due to surgical blood loss or risk of bleeding: yes  BASELINE WEIGHT: 128 kg

## 2017-08-02 NOTE — Anesthesia Procedure Notes (Signed)
Arterial Line Insertion Start/End11/13/2018 7:00 AM, 08/02/2017 7:10 AM Performed by: Harden Mo, CRNA, CRNA  Patient location: Pre-op. Preanesthetic checklist: patient identified, IV checked, site marked, risks and benefits discussed, surgical consent, monitors and equipment checked, pre-op evaluation and anesthesia consent Lidocaine 1% used for infiltration and patient sedated Right, radial was placed Catheter size: 20 G Hand hygiene performed  and maximum sterile barriers used   Attempts: 1 Procedure performed without using ultrasound guided technique. Ultrasound Notes:anatomy identified, needle tip was noted to be adjacent to the nerve/plexus identified and no ultrasound evidence of intravascular and/or intraneural injection Following insertion, dressing applied and Biopatch. Post procedure assessment: normal  Patient tolerated the procedure well with no immediate complications.

## 2017-08-02 NOTE — Anesthesia Procedure Notes (Signed)
Central Venous Catheter Insertion Performed by: Oleta Mouse, MD, anesthesiologist Start/End11/13/2018 6:42 AM, 08/02/2017 6:50 AM Patient location: Pre-op. Preanesthetic checklist: patient identified, IV checked, site marked, risks and benefits discussed, surgical consent, monitors and equipment checked, pre-op evaluation, timeout performed and anesthesia consent Hand hygiene performed  and maximum sterile barriers used  PA cath was placed.Swan type:thermodilution Procedure performed without using ultrasound guided technique. Attempts: 1 Patient tolerated the procedure well with no immediate complications.

## 2017-08-02 NOTE — Anesthesia Procedure Notes (Signed)
Central Venous Catheter Insertion Performed by: Oleta Mouse, MD, anesthesiologist Start/End11/13/2018 6:43 AM, 08/02/2017 6:50 AM Patient location: Pre-op. Preanesthetic checklist: patient identified, IV checked, site marked, risks and benefits discussed, surgical consent, monitors and equipment checked, pre-op evaluation, timeout performed and anesthesia consent Lidocaine 1% used for infiltration and patient sedated Hand hygiene performed  and maximum sterile barriers used  Catheter size: 9 Fr Total catheter length 10. MAC introducer Procedure performed using ultrasound guided technique. Ultrasound Notes:anatomy identified, needle tip was noted to be adjacent to the nerve/plexus identified, no ultrasound evidence of intravascular and/or intraneural injection and image(s) printed for medical record Attempts: 1 Following insertion, line sutured, dressing applied and Biopatch. Post procedure assessment: blood return through all ports, free fluid flow and no air  Patient tolerated the procedure well with no immediate complications.

## 2017-08-02 NOTE — Progress Notes (Signed)
Pre Procedure note for inpatients:   [name] has been scheduled for Procedure(s): CORONARY ARTERY BYPASS GRAFTING (CABG) (N/A) TRANSESOPHAGEAL ECHOCARDIOGRAM (TEE) (N/A) POSSIBLE RADIAL ARTERY HARVEST (Left) today. The various methods of treatment have been discussed with the patient. After consideration of the risks, benefits and treatment options the patient has consented to the planned procedure.   The patient has been seen and labs reviewed. There are no changes in the patient's condition to prevent proceeding with the planned procedure today.  Recent labs:  Lab Results  Component Value Date   WBC 7.0 08/02/2017   HGB 11.0 (L) 08/02/2017   HCT 33.3 (L) 08/02/2017   PLT 233 08/02/2017   GLUCOSE 131 (H) 08/02/2017   CHOL 114 07/26/2017   TRIG 126 07/26/2017   HDL 32 (L) 07/26/2017   LDLCALC 57 07/26/2017   ALT 30 08/01/2017   AST 29 08/01/2017   NA 138 08/02/2017   K 3.5 08/02/2017   CL 110 08/02/2017   CREATININE 0.79 08/02/2017   BUN 11 08/02/2017   CO2 24 08/02/2017   INR 0.99 08/01/2017   HGBA1C 7.1 (H) 08/01/2017    Grace Isaac, MD 08/02/2017 6:54 AM

## 2017-08-02 NOTE — Transfer of Care (Signed)
Immediate Anesthesia Transfer of Care Note  Patient: Bellagrace D Nichols  Procedure(s) Performed: CORONARY ARTERY BYPASS GRAFTING (CABG) X 5 (LIMA to LAD, LEFT RADIAL ARTERY to DIAGONAL, SVG to SEQUENTIALLY PDA and PLB) , USING LEFT INTERNAL MAMMARY ARTERY, LEFT RADIAL ARTERY, AND  SAPHENOUS VEIN to CIRCUMFLEX, RIGHT GREATER SAPHENOUS VEIN HARVESTED ENDOSCOPICALLY (N/A Chest) TRANSESOPHAGEAL ECHOCARDIOGRAM (TEE) (N/A ) LEFT RADIAL ARTERY HARVEST (Left ) STERNAL PLATING (N/A )  Patient Location: ICU  Anesthesia Type:General  Level of Consciousness: sedated, unresponsive and Patient remains intubated per anesthesia plan  Airway & Oxygen Therapy: Patient remains intubated per anesthesia plan and Patient placed on Ventilator (see vital sign flow sheet for setting)  Post-op Assessment: Report given to RN and Post -op Vital signs reviewed and stable  Post vital signs: Reviewed and stable  Last Vitals:  Vitals:   08/02/17 0600 08/02/17 1528  BP:  119/74  Pulse:  89  Resp:  15  Temp: 36.8 C   SpO2:  100%    Last Pain:  Vitals:   08/02/17 0600  TempSrc: Oral  PainSc:       Patients Stated Pain Goal: 0 (41/96/22 2979)  Complications: No apparent anesthesia complications

## 2017-08-03 ENCOUNTER — Encounter (HOSPITAL_COMMUNITY): Payer: Self-pay | Admitting: Cardiothoracic Surgery

## 2017-08-03 ENCOUNTER — Inpatient Hospital Stay (HOSPITAL_COMMUNITY): Payer: BC Managed Care – PPO

## 2017-08-03 LAB — CBC
HCT: 26.8 % — ABNORMAL LOW (ref 36.0–46.0)
HCT: 26.3 % — ABNORMAL LOW (ref 36.0–46.0)
Hemoglobin: 9 g/dL — ABNORMAL LOW (ref 12.0–15.0)
Hemoglobin: 8.6 g/dL — ABNORMAL LOW (ref 12.0–15.0)
MCH: 27.8 pg (ref 26.0–34.0)
MCH: 27.1 pg (ref 26.0–34.0)
MCHC: 33.6 g/dL (ref 30.0–36.0)
MCHC: 32.7 g/dL (ref 30.0–36.0)
MCV: 82.7 fL (ref 78.0–100.0)
MCV: 83 fL (ref 78.0–100.0)
Platelets: 142 10*3/uL — ABNORMAL LOW (ref 150–400)
Platelets: 137 10*3/uL — ABNORMAL LOW (ref 150–400)
RBC: 3.24 MIL/uL — ABNORMAL LOW (ref 3.87–5.11)
RBC: 3.17 MIL/uL — ABNORMAL LOW (ref 3.87–5.11)
RDW: 14.3 % (ref 11.5–15.5)
RDW: 14.2 % (ref 11.5–15.5)
WBC: 11.5 10*3/uL — ABNORMAL HIGH (ref 4.0–10.5)
WBC: 13.1 10*3/uL — ABNORMAL HIGH (ref 4.0–10.5)

## 2017-08-03 LAB — POCT I-STAT 3, ART BLOOD GAS (G3+)
Acid-base deficit: 1 mmol/L (ref 0.0–2.0)
Acid-base deficit: 2 mmol/L (ref 0.0–2.0)
Bicarbonate: 22.9 mmol/L (ref 20.0–28.0)
Bicarbonate: 23.5 mmol/L (ref 20.0–28.0)
O2 Saturation: 97 %
O2 Saturation: 99 %
Patient temperature: 37.7
Patient temperature: 37.9
TCO2: 24 mmol/L (ref 22–32)
TCO2: 25 mmol/L (ref 22–32)
pCO2 arterial: 39.4 mmHg (ref 32.0–48.0)
pCO2 arterial: 41.3 mmHg (ref 32.0–48.0)
pH, Arterial: 7.356 (ref 7.350–7.450)
pH, Arterial: 7.387 (ref 7.350–7.450)
pO2, Arterial: 166 mmHg — ABNORMAL HIGH (ref 83.0–108.0)
pO2, Arterial: 97 mmHg (ref 83.0–108.0)

## 2017-08-03 LAB — POCT I-STAT, CHEM 8
BUN: 6 mg/dL (ref 6–20)
Calcium, Ion: 1.08 mmol/L — ABNORMAL LOW (ref 1.15–1.40)
Chloride: 104 mmol/L (ref 101–111)
Creatinine, Ser: 0.7 mg/dL (ref 0.44–1.00)
Glucose, Bld: 230 mg/dL — ABNORMAL HIGH (ref 65–99)
HCT: 25 % — ABNORMAL LOW (ref 36.0–46.0)
Hemoglobin: 8.5 g/dL — ABNORMAL LOW (ref 12.0–15.0)
Potassium: 4 mmol/L (ref 3.5–5.1)
Sodium: 139 mmol/L (ref 135–145)
TCO2: 22 mmol/L (ref 22–32)

## 2017-08-03 LAB — GLUCOSE, CAPILLARY
Glucose-Capillary: 103 mg/dL — ABNORMAL HIGH (ref 65–99)
Glucose-Capillary: 108 mg/dL — ABNORMAL HIGH (ref 65–99)
Glucose-Capillary: 108 mg/dL — ABNORMAL HIGH (ref 65–99)
Glucose-Capillary: 109 mg/dL — ABNORMAL HIGH (ref 65–99)
Glucose-Capillary: 110 mg/dL — ABNORMAL HIGH (ref 65–99)
Glucose-Capillary: 111 mg/dL — ABNORMAL HIGH (ref 65–99)
Glucose-Capillary: 113 mg/dL — ABNORMAL HIGH (ref 65–99)
Glucose-Capillary: 115 mg/dL — ABNORMAL HIGH (ref 65–99)
Glucose-Capillary: 115 mg/dL — ABNORMAL HIGH (ref 65–99)
Glucose-Capillary: 117 mg/dL — ABNORMAL HIGH (ref 65–99)
Glucose-Capillary: 122 mg/dL — ABNORMAL HIGH (ref 65–99)
Glucose-Capillary: 130 mg/dL — ABNORMAL HIGH (ref 65–99)
Glucose-Capillary: 135 mg/dL — ABNORMAL HIGH (ref 65–99)
Glucose-Capillary: 166 mg/dL — ABNORMAL HIGH (ref 65–99)
Glucose-Capillary: 88 mg/dL (ref 65–99)
Glucose-Capillary: 99 mg/dL (ref 65–99)

## 2017-08-03 LAB — CREATININE, SERUM
Creatinine, Ser: 0.84 mg/dL (ref 0.44–1.00)
GFR calc Af Amer: >60 mL/min (ref >60)
GFR calc non Af Amer: >60 mL/min (ref >60)

## 2017-08-03 LAB — BASIC METABOLIC PANEL
Anion gap: 4 — ABNORMAL LOW (ref 5–15)
BUN: 9 mg/dL (ref 6–20)
CO2: 23 mmol/L (ref 22–32)
Calcium: 6.9 mg/dL — ABNORMAL LOW (ref 8.9–10.3)
Chloride: 112 mmol/L — ABNORMAL HIGH (ref 101–111)
Creatinine, Ser: 0.78 mg/dL (ref 0.44–1.00)
GFR calc Af Amer: >60 mL/min (ref >60)
GFR calc non Af Amer: >60 mL/min (ref >60)
Glucose, Bld: 104 mg/dL — ABNORMAL HIGH (ref 65–99)
Potassium: 3.9 mmol/L (ref 3.5–5.1)
Sodium: 139 mmol/L (ref 135–145)

## 2017-08-03 LAB — MAGNESIUM
Magnesium: 2.5 mg/dL — ABNORMAL HIGH (ref 1.7–2.4)
Magnesium: 2.4 mg/dL (ref 1.7–2.4)

## 2017-08-03 MED ORDER — POTASSIUM CHLORIDE CRYS ER 20 MEQ PO TBCR
20.0000 meq | EXTENDED_RELEASE_TABLET | Freq: Once | ORAL | Status: AC
Start: 2017-08-03 — End: 2017-08-03
  Administered 2017-08-03: 20 meq via ORAL
  Filled 2017-08-03: qty 1

## 2017-08-03 MED ORDER — ENOXAPARIN SODIUM 40 MG/0.4ML ~~LOC~~ SOLN
40.0000 mg | Freq: Every day | SUBCUTANEOUS | Status: DC
  Administered 2017-08-03 – 2017-08-08 (×6): 40 mg via SUBCUTANEOUS
  Filled 2017-08-03 (×6): qty 0.4

## 2017-08-03 MED ORDER — CLOPIDOGREL BISULFATE 75 MG PO TABS
75.0000 mg | ORAL_TABLET | Freq: Every day | ORAL | Status: DC
  Administered 2017-08-03 – 2017-08-09 (×7): 75 mg via ORAL
  Filled 2017-08-03 (×7): qty 1

## 2017-08-03 MED ORDER — CHLORHEXIDINE GLUCONATE CLOTH 2 % EX PADS
6.0000 | MEDICATED_PAD | Freq: Every day | CUTANEOUS | Status: DC
  Administered 2017-08-03 – 2017-08-06 (×4): 6 via TOPICAL

## 2017-08-03 MED ORDER — INSULIN ASPART 100 UNIT/ML ~~LOC~~ SOLN
0.0000 [IU] | SUBCUTANEOUS | Status: DC
  Administered 2017-08-03: 8 [IU] via SUBCUTANEOUS
  Administered 2017-08-03 (×2): 4 [IU] via SUBCUTANEOUS
  Administered 2017-08-04 (×2): 2 [IU] via SUBCUTANEOUS
  Administered 2017-08-04: 4 [IU] via SUBCUTANEOUS
  Administered 2017-08-04 – 2017-08-06 (×8): 2 [IU] via SUBCUTANEOUS

## 2017-08-03 MED ORDER — SODIUM CHLORIDE 0.9% FLUSH: 10.0000 mL | INTRAVENOUS | Status: DC | PRN

## 2017-08-03 MED ORDER — INSULIN DETEMIR 100 UNIT/ML ~~LOC~~ SOLN
20.0000 [IU] | Freq: Every day | SUBCUTANEOUS | Status: DC
  Administered 2017-08-03: 20 [IU] via SUBCUTANEOUS
  Filled 2017-08-03 (×2): qty 0.2

## 2017-08-03 MED ORDER — SODIUM CHLORIDE 0.9% FLUSH
10.0000 mL | Freq: Two times a day (BID) | INTRAVENOUS | Status: DC
  Administered 2017-08-03: 10 mL
  Administered 2017-08-04: 20 mL
  Administered 2017-08-05 – 2017-08-09 (×6): 10 mL

## 2017-08-03 MED FILL — Sodium Chloride IV Soln 0.9%: INTRAVENOUS | Qty: 2000 | Status: AC

## 2017-08-03 MED FILL — Electrolyte-R (PH 7.4) Solution: INTRAVENOUS | Qty: 6000 | Status: AC

## 2017-08-03 MED FILL — Sodium Bicarbonate IV Soln 8.4%: INTRAVENOUS | Qty: 50 | Status: AC

## 2017-08-03 MED FILL — Lidocaine HCl IV Inj 20 MG/ML: INTRAVENOUS | Qty: 5 | Status: AC

## 2017-08-03 MED FILL — Heparin Sodium (Porcine) Inj 1000 Unit/ML: INTRAMUSCULAR | Qty: 20 | Status: AC

## 2017-08-03 MED FILL — Mannitol IV Soln 20%: INTRAVENOUS | Qty: 500 | Status: AC

## 2017-08-03 NOTE — Progress Notes (Addendum)
T[facility] DAILY ICU PROGRESS NOTE                   [address] [place]            ,[place] [address]          [phone]   1 Day Post-Op Procedure(s) (LRB): CORONARY ARTERY BYPASS GRAFTING (CABG) X 5 (LIMA to LAD, LEFT RADIAL ARTERY to DIAGONAL, SVG to SEQUENTIALLY PDA and PLB) , USING LEFT INTERNAL MAMMARY ARTERY, LEFT RADIAL ARTERY, AND  SAPHENOUS VEIN to CIRCUMFLEX, RIGHT GREATER SAPHENOUS VEIN HARVESTED ENDOSCOPICALLY (N/A) TRANSESOPHAGEAL ECHOCARDIOGRAM (TEE) (N/A) LEFT RADIAL ARTERY HARVEST (Left) STERNAL PLATING (N/A)  Total Length of Stay:  LOS: 10 days   Subjective: Patient a little groggy this am. She asked where her forms for work absence needs to be taken.  Objective: Vital signs in last 24 hours: Temp:  [94.8 F (34.9 C)-100.2 F (37.9 C)] 100.2 F (37.9 C) (11/14 0422) Pulse Rate:  [73-93] 78 (11/14 0422) Cardiac Rhythm: Normal sinus rhythm (11/14 0000) Resp:  [0-23] 23 (11/14 0422) BP: (110-119)/(74-82) 110/82 (11/13 1557) SpO2:  [100 %] 100 % (11/14 0422) Arterial Line BP: (87-142)/(46-92) 98/48 (11/14 0422) FiO2 (%):  [40 %-50 %] 40 % (11/14 0000) Weight:  [285 lb 7.9 oz (129.5 kg)] 285 lb 7.9 oz (129.5 kg) (11/14 0600)  Filed Weights   07/29/17 2113 08/02/17 0600 08/03/17 0600  Weight: 283 lb 4.8 oz (128.5 kg) 283 lb 1.1 oz (128.4 kg) 285 lb 7.9 oz (129.5 kg)    Weight change: 2 lb 6.8 oz (1.1 kg)   Hemodynamic parameters for last 24 hours: PAP: (14-29)/(1-17) 26/13 CO:  [2.3 L/min-5.5 L/min] 5.5 L/min CI:  [1 L/min/m2-2.5 L/min/m2] 2.5 L/min/m2  Intake/Output from previous day: 11/13 0701 - 11/14 0700 In: 6277.2 [I.V.:4029.2; Blood:548; IV Piggyback:1700] Out: 8730 [BWIOM:3559; Blood:2200; Chest Tube:160]  Intake/Output this shift: No intake/output data recorded.  Current Meds: Scheduled Meds: . acetaminophen  1,000 mg Oral Q6H   Or  . acetaminophen (TYLENOL) oral liquid 160 mg/5 mL  1,000 mg Per Tube Q6H  . aspirin EC  325 mg Oral  Daily   Or  . aspirin  324 mg Per Tube Daily  . atorvastatin  80 mg Oral q1800  . bisacodyl  10 mg Oral Daily   Or  . bisacodyl  10 mg Rectal Daily  . chlorhexidine gluconate (MEDLINE KIT)  15 mL Mouth Rinse BID  . colchicine  0.6 mg Oral BID  . docusate sodium  200 mg Oral Daily  . insulin regular  0-10 Units Intravenous TID WC  . isosorbide mononitrate  15 mg Oral Daily  . mouth rinse  15 mL Mouth Rinse QID  . metoprolol tartrate  12.5 mg Oral BID   Or  . metoprolol tartrate  12.5 mg Per Tube BID  . pantoprazole  40 mg Oral Daily  . sodium chloride flush  3 mL Intravenous Q12H   Continuous Infusions: . sodium chloride 20 mL/hr at 08/03/17 0400  . sodium chloride 250 mL (08/03/17 0616)  . sodium chloride Stopped (08/02/17 2000)  . cefUROXime (ZINACEF)  IV Stopped (08/03/17 7416)  . dexmedetomidine (PRECEDEX) IV infusion Stopped (08/02/17 1908)  . DOPamine 2.991 mcg/kg/min (08/03/17 0400)  . insulin (NOVOLIN-R) infusion 3.4 Units/hr (08/03/17 0400)  . lactated ringers    . lactated ringers Stopped (08/02/17 2300)  . lactated ringers 20 mL/hr at 08/03/17 0400  . nitroGLYCERIN 5 mcg/min (08/03/17 0400)  . phenylephrine (NEO-SYNEPHRINE)  Adult infusion 15.067 mcg/min (08/03/17 0400)   PRN Meds:.sodium chloride, ALPRAZolam, lactated ringers, metoprolol tartrate, morphine injection, ondansetron (ZOFRAN) IV, oxyCODONE, sodium chloride flush, traMADol  General appearance: cooperative, no distress and morbidly obese Neurologic: intact Heart: RRR Lungs: Diminshed at bases Abdomen: Soft, obese, sporadic bowel sounds Extremities: Mild LE edema bilaterally Wound: Aquacel dressing on sternum, RLE dressing dry and intact  Lab Results: CBC: Recent Labs    08/02/17 2156 08/03/17 0324  WBC 14.3* 11.5*  HGB 10.2* 9.0*  HCT 30.9* 26.8*  PLT 133* 142*   BMET:  Recent Labs    08/02/17 0239  08/02/17 2055 08/02/17 2156 08/03/17 0324  NA 138   < > 142  --  139  K 3.5   < > 4.1   --  3.9  CL 110   < > 109  --  112*  CO2 24  --   --   --  23  GLUCOSE 131*   < > 146*  --  104*  BUN 11   < > 7  --  9  CREATININE 0.79   < > 0.60 0.67 0.78  CALCIUM 8.1*  --   --   --  6.9*   < > = values in this interval not displayed.    CMET: Lab Results  Component Value Date   WBC 11.5 (H) 08/03/2017   HGB 9.0 (L) 08/03/2017   HCT 26.8 (L) 08/03/2017   PLT 142 (L) 08/03/2017   GLUCOSE 104 (H) 08/03/2017   CHOL 114 07/26/2017   TRIG 126 07/26/2017   HDL 32 (L) 07/26/2017   LDLCALC 57 07/26/2017   ALT 30 08/01/2017   AST 29 08/01/2017   NA 139 08/03/2017   K 3.9 08/03/2017   CL 112 (H) 08/03/2017   CREATININE 0.78 08/03/2017   BUN 9 08/03/2017   CO2 23 08/03/2017   INR 1.70 08/02/2017   HGBA1C 7.1 (H) 08/01/2017     PT/INR:  Recent Labs    08/02/17 1531  LABPROT 19.8*  INR 1.70   Radiology: Dg Chest Port 1 View  Result Date: 08/03/2017 CLINICAL DATA:  Status postextubation EXAM: PORTABLE CHEST 1 VIEW COMPARISON:  August 02, 2017 FINDINGS: Endotracheal tube and nasogastric tube have been removed. Swan-Ganz catheter tip is in the main pulmonary outflow tract, stable. There is a mediastinal drain and a left chest tube. No evident pneumothorax. There are small pleural effusions bilaterally. No edema or consolidation. Heart is upper normal in size with pulmonary vascularity within normal limits. No adenopathy. No evident bone lesions. IMPRESSION: Tube and catheter positions as described without pneumothorax. Small pleural effusions bilaterally. No edema or consolidation. Stable cardiac silhouette. Electronically Signed   By: Lowella Grip III M.D.   On: 08/03/2017 07:41   Dg Chest Port 1 View  Result Date: 08/02/2017 CLINICAL DATA:  As post CABG.  History of diabetes. EXAM: PORTABLE CHEST 1 VIEW COMPARISON:  Chest x-ray of August 01, 2017 FINDINGS: The lungs are reasonably well inflated. There is no pneumothorax nor significant pleural effusion. The heart is  top-normal in size. The central pulmonary vascularity is mildly prominent. There is no significant pulmonary edema. The endotracheal tube tip lies approximately 2.9 cm above the carina. The esophagogastric tube tip and proximal port project below the GE junction. The Swan-Ganz catheter tip projects in the proximal main pulmonary outflow tract. The mediastinal drain terminates at the T3 level. The left chest tube tip projects over the posterior aspect of the left second  rib. IMPRESSION: Status post CABG. No significant pulmonary edema. No pleural effusion or pneumothorax or lower lobe atelectasis. The support tubes are in reasonable position. Electronically Signed   By: David  Martinique M.D.   On: 08/02/2017 15:44    Assessment/Plan: S/P Procedure(s) (LRB): CORONARY ARTERY BYPASS GRAFTING (CABG) X 5 (LIMA to LAD, LEFT RADIAL ARTERY to DIAGONAL, SVG to SEQUENTIALLY PDA and PLB) , USING LEFT INTERNAL MAMMARY ARTERY, LEFT RADIAL ARTERY, AND  SAPHENOUS VEIN to CIRCUMFLEX, RIGHT GREATER SAPHENOUS VEIN HARVESTED ENDOSCOPICALLY (N/A) TRANSESOPHAGEAL ECHOCARDIOGRAM (TEE) (N/A) LEFT RADIAL ARTERY HARVEST (Left) STERNAL PLATING (N/A)  1. CV-SR in the 80's this am. CO/CI 4.4 and 2. On Neo Synephrine, Nitro, and Dopamine drips. Wean as tolerates.  2. Pulmonary-Chest tubes with 160 cc since surgery. As discussed with Dr. Servando Snare, will remove chest tubes. CXR this am shows no pneumothorax, bilateral pleural effusions, and cardiomegaly. Encourage incentive spirometer 3. Volume overload-Will give diuretics once off pressors 4. ABL anemia-H and H this am 9 and 26.8 5. DM-CBGs 88/99/122. On Insulin drip. Will transition to scheduled Insulin. Pre op HGA1C 7.1. 6. Supplement potassium 7. Will ask for PICC placement then remove central line. 8. Please see progression orders-remove Swan, chest tubes, give DVT proph Lovenox etc  ZIMMERMAN,DONIELLE M PA-C 08/03/2017 8:05 AM   Expected Acute  Blood - loss Anemia- continue  to monitor  I have seen and examined Alvenia D Nichols and agree with the above assessment  and plan.  Grace Isaac MD Beeper [phone] Office [phone] 08/03/2017 8:48 AM

## 2017-08-03 NOTE — Progress Notes (Signed)
Started rate of 4 and 40% per heart wean protocol.

## 2017-08-03 NOTE — Progress Notes (Signed)
RT attempted pulmonary mechanics per rapid cardiac wean protocol, after an ABG within normal parameters and have passed both weaning phases on the vent. However, at this time pt did not meet extubation criteria on the Vital Capacity and NIF. Returned pt to full support to rest and and will attempt to wean when pt is more alert.

## 2017-08-03 NOTE — Progress Notes (Signed)
Peripherally Inserted Central Catheter/Midline Placement  The IV Nurse has discussed with the patient and/or persons authorized to consent for the patient, the purpose of this procedure and the potential benefits and risks involved with this procedure.  The benefits include less needle sticks, lab draws from the catheter, and the patient may be discharged home with the catheter. Risks include, but not limited to, infection, bleeding, blood clot (thrombus formation), and puncture of an artery; nerve damage and irregular heartbeat and possibility to perform a PICC exchange if needed/ordered by physician.  Alternatives to this procedure were also discussed.  Bard Power PICC patient education guide, fact sheet on infection prevention and patient information card has been provided to patient /or left at bedside.    PICC/Midline Placement Documentation        Christella Noa Albarece 08/03/2017, 10:14 AM

## 2017-08-03 NOTE — Progress Notes (Signed)
      [address]       [place] [address]             [phone]      Asleep  BP (!) 115/57 (BP Location: Right Arm)   Pulse 93   Temp 98.7 F (37.1 C) (Oral)   Resp (!) 23   Ht 5\' 3"  (1.6 m)   Wt 285 lb 7.9 oz (129.5 kg)   LMP 07/18/2017   SpO2 100%   BMI 50.57 kg/m    Intake/Output Summary (Last 24 hours) at 08/03/2017 2105 Last data filed at 08/03/2017 2000 Gross per 24 hour  Intake 1691.41 ml  Output 2035 ml  Net -343.59 ml   CBG elevated at 230 this PM On dopamine at 3, off neo  K=4.0, Hct = 25  Steven C. Roxan Hockey, MD [facility] [phone]

## 2017-08-03 NOTE — Plan of Care (Signed)
Pt extubated on 2L o2. Neo and dopamine Gtt to maintain BP

## 2017-08-03 NOTE — Procedures (Signed)
Extubation Procedure Note  Patient Details:   Name: [name] DOB: [birth year] MRN: [id number]   Airway Documentation:  Airway 8 mm (Active)  Secured at (cm) 22 cm 08/03/2017 12:00 AM  Measured From Lips 08/03/2017 12:00 AM  Secured Location Right 08/03/2017 12:00 AM  Secured By Pink Tape 08/03/2017 12:00 AM  Cuff Pressure (cm H2O) 28 cm H2O 08/02/2017  7:51 PM  Site Condition Dry 08/03/2017 12:00 AM    Evaluation  O2 sats: stable throughout Complications: No apparent complications Patient did tolerate procedure well. Bilateral Breath Sounds: Clear   Yes   Pulmonary mechanics done prior to extubation with good pt effort. NIF -26, and 1.1L on Vital Capacity. Pt had positive cuff leak. Vitals stable. RN to complete Incentive Spirometry  with pt. RN to cont to monitor.   Darryl Nestle F 08/03/2017, 12:56 AM

## 2017-08-04 ENCOUNTER — Inpatient Hospital Stay (HOSPITAL_COMMUNITY): Payer: BC Managed Care – PPO

## 2017-08-04 LAB — CBC
HCT: 26.3 % — ABNORMAL LOW (ref 36.0–46.0)
Hemoglobin: 8.4 g/dL — ABNORMAL LOW (ref 12.0–15.0)
MCH: 26.7 pg (ref 26.0–34.0)
MCHC: 31.9 g/dL (ref 30.0–36.0)
MCV: 83.5 fL (ref 78.0–100.0)
Platelets: 131 10*3/uL — ABNORMAL LOW (ref 150–400)
RBC: 3.15 MIL/uL — ABNORMAL LOW (ref 3.87–5.11)
RDW: 14.4 % (ref 11.5–15.5)
WBC: 14.6 10*3/uL — ABNORMAL HIGH (ref 4.0–10.5)

## 2017-08-04 LAB — GLUCOSE, CAPILLARY
Glucose-Capillary: 162 mg/dL — ABNORMAL HIGH (ref 65–99)
Glucose-Capillary: 150 mg/dL — ABNORMAL HIGH (ref 65–99)
Glucose-Capillary: 163 mg/dL — ABNORMAL HIGH (ref 65–99)
Glucose-Capillary: 173 mg/dL — ABNORMAL HIGH (ref 65–99)
Glucose-Capillary: 144 mg/dL — ABNORMAL HIGH (ref 65–99)
Glucose-Capillary: 148 mg/dL — ABNORMAL HIGH (ref 65–99)

## 2017-08-04 LAB — BASIC METABOLIC PANEL
Anion gap: 5 (ref 5–15)
BUN: 7 mg/dL (ref 6–20)
CO2: 24 mmol/L (ref 22–32)
Calcium: 7.4 mg/dL — ABNORMAL LOW (ref 8.9–10.3)
Chloride: 107 mmol/L (ref 101–111)
Creatinine, Ser: 0.97 mg/dL (ref 0.44–1.00)
GFR calc Af Amer: >60 mL/min (ref >60)
GFR calc non Af Amer: >60 mL/min (ref >60)
Glucose, Bld: 138 mg/dL — ABNORMAL HIGH (ref 65–99)
Potassium: 4.3 mmol/L (ref 3.5–5.1)
Sodium: 136 mmol/L (ref 135–145)

## 2017-08-04 MED ORDER — METOCLOPRAMIDE HCL 5 MG/ML IJ SOLN
10.0000 mg | Freq: Four times a day (QID) | INTRAMUSCULAR | Status: DC
  Administered 2017-08-04 – 2017-08-05 (×6): 10 mg via INTRAVENOUS
  Filled 2017-08-04 (×7): qty 2

## 2017-08-04 MED ORDER — ORAL CARE MOUTH RINSE
15.0000 mL | Freq: Two times a day (BID) | OROMUCOSAL | Status: DC
  Administered 2017-08-04: 15 mL via OROMUCOSAL

## 2017-08-04 MED ORDER — ISOSORBIDE MONONITRATE ER 30 MG PO TB24
30.0000 mg | ORAL_TABLET | Freq: Every day | ORAL | Status: DC
  Administered 2017-08-04 – 2017-08-09 (×6): 30 mg via ORAL
  Filled 2017-08-04 (×6): qty 1

## 2017-08-04 MED ORDER — GUAIFENESIN ER 600 MG PO TB12
600.0000 mg | ORAL_TABLET | Freq: Two times a day (BID) | ORAL | Status: DC
  Administered 2017-08-04 – 2017-08-09 (×10): 600 mg via ORAL
  Filled 2017-08-04 (×11): qty 1

## 2017-08-04 MED ORDER — FOLIC ACID 1 MG PO TABS
1.0000 mg | ORAL_TABLET | Freq: Every day | ORAL | Status: DC
  Administered 2017-08-04 – 2017-08-07 (×4): 1 mg via ORAL
  Filled 2017-08-04 (×4): qty 1

## 2017-08-04 MED ORDER — INSULIN DETEMIR 100 UNIT/ML ~~LOC~~ SOLN
24.0000 [IU] | Freq: Every day | SUBCUTANEOUS | Status: DC
  Administered 2017-08-04 – 2017-08-07 (×4): 24 [IU] via SUBCUTANEOUS
  Filled 2017-08-04 (×6): qty 0.24

## 2017-08-04 MED FILL — Potassium Chloride Inj 2 mEq/ML: INTRAVENOUS | Qty: 20 | Status: AC

## 2017-08-04 MED FILL — Heparin Sodium (Porcine) Inj 1000 Unit/ML: INTRAMUSCULAR | Qty: 30 | Status: AC

## 2017-08-04 MED FILL — Heparin Sodium (Porcine) Inj 1000 Unit/ML: INTRAMUSCULAR | Qty: 2500 | Status: AC

## 2017-08-04 MED FILL — Magnesium Sulfate Inj 50%: INTRAMUSCULAR | Qty: 10 | Status: AC

## 2017-08-04 NOTE — Progress Notes (Addendum)
[facility] DAILY ICU PROGRESS NOTE                   [address]            [place] [address]          [phone]   2 Days Post-Op Procedure(s) (LRB): CORONARY ARTERY BYPASS GRAFTING (CABG) X 5 (LIMA to LAD, LEFT RADIAL ARTERY to DIAGONAL, SVG to SEQUENTIALLY PDA and PLB) , USING LEFT INTERNAL MAMMARY ARTERY, LEFT RADIAL ARTERY, AND  SAPHENOUS VEIN to CIRCUMFLEX, RIGHT GREATER SAPHENOUS VEIN HARVESTED ENDOSCOPICALLY (N/A) TRANSESOPHAGEAL ECHOCARDIOGRAM (TEE) (N/A) LEFT RADIAL ARTERY HARVEST (Left) STERNAL PLATING (N/A)  Total Length of Stay:  LOS: 11 days   Subjective: Patient sitting in chair. She states she has intermittent nausea. She also has difficulty "bringing up sputum".  Objective: Vital signs in last 24 hours: Temp:  [97.7 F (36.5 C)-100 F (37.8 C)] 98 F (36.7 C) (11/15 0400) Pulse Rate:  [80-95] 87 (11/15 0600) Cardiac Rhythm: Normal sinus rhythm (11/15 0400) Resp:  [11-30] 19 (11/15 0600) BP: (106-140)/(52-78) 140/65 (11/15 0600) SpO2:  [95 %-100 %] 100 % (11/15 0600) Arterial Line BP: (80-145)/(49-108) 125/66 (11/14 1830) Weight:  [290 lb 2 oz (131.6 kg)] 290 lb 2 oz (131.6 kg) (11/15 0500)  Filed Weights   08/02/17 0600 08/03/17 0600 08/04/17 0500  Weight: 283 lb 1.1 oz (128.4 kg) 285 lb 7.9 oz (129.5 kg) 290 lb 2 oz (131.6 kg)    Weight change: 4 lb 10.1 oz (2.1 kg)   Hemodynamic parameters for last 24 hours: PAP: (25-28)/(13-15) 28/15  Intake/Output from previous day: 11/14 0701 - 11/15 0700 In: 1186.7 [P.O.:120; I.V.:866.7; IV Piggyback:200] Out: 1250 [Urine:1250]  Intake/Output this shift: No intake/output data recorded.  Current Meds: Scheduled Meds: . acetaminophen  1,000 mg Oral Q6H   Or  . acetaminophen (TYLENOL) oral liquid 160 mg/5 mL  1,000 mg Per Tube Q6H  . aspirin EC  325 mg Oral Daily   Or  . aspirin  324 mg Per Tube Daily  . atorvastatin  80 mg Oral q1800  . bisacodyl  10 mg Oral Daily   Or  . bisacodyl  10  mg Rectal Daily  . Chlorhexidine Gluconate Cloth  6 each Topical Daily  . clopidogrel  75 mg Oral Daily  . colchicine  0.6 mg Oral BID  . docusate sodium  200 mg Oral Daily  . enoxaparin (LOVENOX) injection  40 mg Subcutaneous QHS  . insulin aspart  0-24 Units Subcutaneous Q4H  . insulin detemir  20 Units Subcutaneous Daily  . insulin regular  0-10 Units Intravenous TID WC  . isosorbide mononitrate  15 mg Oral Daily  . mouth rinse  15 mL Mouth Rinse BID  . metoprolol tartrate  12.5 mg Oral BID   Or  . metoprolol tartrate  12.5 mg Per Tube BID  . pantoprazole  40 mg Oral Daily  . sodium chloride flush  10-40 mL Intracatheter Q12H  . sodium chloride flush  3 mL Intravenous Q12H   Continuous Infusions: . sodium chloride 20 mL/hr at 08/04/17 0600  . sodium chloride Stopped (08/03/17 0800)  . sodium chloride Stopped (08/02/17 2000)  . dexmedetomidine (PRECEDEX) IV infusion Stopped (08/02/17 1908)  . DOPamine 1.994 mcg/kg/min (08/04/17 0600)  . insulin (NOVOLIN-R) infusion Stopped (08/03/17 1400)  . lactated ringers    . lactated ringers Stopped (08/02/17 2300)  . lactated ringers Stopped (08/03/17 1300)  . nitroGLYCERIN Stopped (08/03/17 1035)  .  phenylephrine (NEO-SYNEPHRINE) Adult infusion Stopped (08/03/17 1825)   PRN Meds:.sodium chloride, ALPRAZolam, lactated ringers, metoprolol tartrate, morphine injection, ondansetron (ZOFRAN) IV, oxyCODONE, sodium chloride flush, sodium chloride flush, traMADol  General appearance: cooperative, no distress and morbidly obese Neurologic: intact Heart: RRR Lungs: Diminshed at bases Abdomen: Soft, obese, sporadic bowel sounds Extremities: Mild LE edema bilaterally. Motor/sensory intact LUE Wound: Aquacel dressing on sternum, RLE dressing dry and intact  Lab Results: CBC: Recent Labs    08/03/17 1643 08/03/17 1650 08/04/17 0430  WBC 13.1*  --  14.6*  HGB 8.6* 8.5* 8.4*  HCT 26.3* 25.0* 26.3*  PLT 137*  --  131*   BMET:  Recent  Labs    08/03/17 0324  08/03/17 1650 08/04/17 0430  NA 139  --  139 136  K 3.9  --  4.0 4.3  CL 112*  --  104 107  CO2 23  --   --  24  GLUCOSE 104*  --  230* 138*  BUN 9  --  6 7  CREATININE 0.78   < > 0.70 0.97  CALCIUM 6.9*  --   --  7.4*   < > = values in this interval not displayed.    CMET: Lab Results  Component Value Date   WBC 14.6 (H) 08/04/2017   HGB 8.4 (L) 08/04/2017   HCT 26.3 (L) 08/04/2017   PLT 131 (L) 08/04/2017   GLUCOSE 138 (H) 08/04/2017   CHOL 114 07/26/2017   TRIG 126 07/26/2017   HDL 32 (L) 07/26/2017   LDLCALC 57 07/26/2017   ALT 30 08/01/2017   AST 29 08/01/2017   NA 136 08/04/2017   K 4.3 08/04/2017   CL 107 08/04/2017   CREATININE 0.97 08/04/2017   BUN 7 08/04/2017   CO2 24 08/04/2017   INR 1.70 08/02/2017   HGBA1C 7.1 (H) 08/01/2017     PT/INR:  Recent Labs    08/02/17 1531  LABPROT 19.8*  INR 1.70   Radiology: No results found.  Assessment/Plan: S/P Procedure(s) (LRB): CORONARY ARTERY BYPASS GRAFTING (CABG) X 5 (LIMA to LAD, LEFT RADIAL ARTERY to DIAGONAL, SVG to SEQUENTIALLY PDA and PLB) , USING LEFT INTERNAL MAMMARY ARTERY, LEFT RADIAL ARTERY, AND  SAPHENOUS VEIN to CIRCUMFLEX, RIGHT GREATER SAPHENOUS VEIN HARVESTED ENDOSCOPICALLY (N/A) TRANSESOPHAGEAL ECHOCARDIOGRAM (TEE) (N/A) LEFT RADIAL ARTERY HARVEST (Left) STERNAL PLATING (N/A)  1. CV-SR in the 80's this am. Weaned off all drips. On Lopressor 12.5 mg bid and Imdur 15 mg daily. Will increase Imdur to 30 mg daily to help with BP.  2. Pulmonary- On room air. CXR this am shows low lung volumes, no pneumothorax, bilateral pleural effusions and atelectasis, and cardiomegaly. Encourage incentive spirometer and flutter valve. 3. Volume overload-Will give IV Lasix 4. ABL anemia-H and H this am 8.4 and 26.3. Will start folic acid but not iron yet as she has intermittent nausea. Will give a few doses of Reglan and Zofran PRN for nausea. 5. DM-CBGs 166/162/150. On scheduled Insulin  and will adjust for better glucose control. Pre op HGA1C 7.1. 6. PT to assist with ambulation 7. Remove foley  ZIMMERMAN,DONIELLE M PA-C 08/04/2017 8:10 AM   I have seen and examined [name] and agree with the above assessment  and plan.  Grace Isaac MD [facility] [phone] Office [phone] 08/04/2017 8:35 AM

## 2017-08-04 NOTE — Evaluation (Signed)
Physical Therapy Evaluation Patient Details Name: [name] MRN: [id number] DOB: [birth year] Today's Date: 08/04/2017   History of Present Illness  Pt admitted 07/24/17 with STEMI; s/p angioplasty and stent to the mid RCA with severe 3 vessel disease; now s/p CABGx5 on 08/02/17. Extubated 11/14. Pertinent PMH includes DM, obesity (s/p lap band).    Clinical Impression  Pt presents with pain, fatigue, and an overall decrease in functional mobility secondary to above. PTA, pt indep with mobility. Plans to d/c home with mother who can provide 24/7 assist. Educ on sternal precautions, IS/flutter valve, deep breathing technique, and importance of mobility. Today, pt able to transfer and amb with Ava walker and intermittent min guard secondary to increased fatigue. At rest, HR 80, SpO2 100% on RA, BP 136/71; HR up to 90s with ambulation and BP 134/64. If pt continues to progress well, do not feel she will need follow-up PT services. Will continue to follow acutely to maximize functional mobility and independence prior to d/c home.     Follow Up Recommendations No PT follow up;Supervision for mobility/OOB    Equipment Recommendations  Rolling walker with 5" wheels    Recommendations for Other Services OT consult     Precautions / Restrictions Precautions Precautions: Sternal;Fall Restrictions Weight Bearing Restrictions: No      Mobility  Bed Mobility Overal bed mobility: Needs Assistance             General bed mobility comments: Received sitting in chair; pt reports she requires assist and use of heart pillow  Transfers Overall transfer level: Needs assistance Equipment used: Rolling walker (2 wheeled);None(Ava walker) Transfers: Sit to/from Stand Sit to Stand: Min guard         General transfer comment: Min guard for standing with ava walker, pt with good technique holdign heart pillow. Able to stand partially again with no AD to remove  gown  Ambulation/Gait Ambulation/Gait assistance: Supervision;Min guard Ambulation Distance (Feet): 340 Feet Assistive device: Rolling walker (2 wheeled)(Ava walker) Gait Pattern/deviations: Step-through pattern;Decreased stride length Gait velocity: Decreased Gait velocity interpretation: <1.8 ft/sec, indicative of risk for recurrent falls General Gait Details: Slow, controlled gait speed requiring intermittent min guard secondary to fatigue, progressing to supervision; only required assist for line management  Stairs            Wheelchair Mobility    Modified Rankin (Stroke Patients Only)       Balance Overall balance assessment: Needs assistance   Sitting balance-Leahy Scale: Good       Standing balance-Leahy Scale: Fair Standing balance comment: Able to static stand with no UE support                             Pertinent Vitals/Pain Pain Assessment: Faces Faces Pain Scale: Hurts little more Pain Location: Sternal incision Pain Descriptors / Indicators: Aching;Sore Pain Intervention(s): Monitored during session    Home Living Family/patient expects to be discharged to:: Private residence Living Arrangements: Spouse/significant other(Boyfriend) Available Help at Discharge: Family;Available 24 hours/day Type of Home: House Home Access: Stairs to enter   [employer] of Steps: 1 Home Layout: Two level;Bed/bath upstairs Home Equipment: None Additional Comments: Plans to live with mother at d/c who will be able to provide 24/7 assist    Prior Function Level of Independence: Independent               Hand Dominance        Extremity/Trunk Assessment  Upper Extremity Assessment Upper Extremity Assessment: Overall WFL for tasks assessed    Lower Extremity Assessment Lower Extremity Assessment: Overall WFL for tasks assessed    Cervical / Trunk Assessment Cervical / Trunk Assessment: Normal  Communication    Communication: No difficulties  Cognition Arousal/Alertness: Suspect due to medications;Lethargic Behavior During Therapy: WFL for tasks assessed/performed Overall Cognitive Status: Within Functional Limits for tasks assessed                                 General Comments: Pt very tired throughout session, requiring intermittent cues to keep eyes open; increased arousal with help of playing music on phone      General Comments      Exercises     Assessment/Plan    PT Assessment Patient needs continued PT services  PT Problem List Decreased activity tolerance;Decreased balance;Decreased mobility;Decreased knowledge of use of DME       PT Treatment Interventions DME instruction;Gait training;Stair training;Functional mobility training;Therapeutic exercise;Balance training;Patient/family education    PT Goals (Current goals can be found in the Care Plan section)  Acute Rehab PT Goals Patient Stated Goal: Decreased pain and fatigue PT Goal Formulation: With patient Time For Goal Achievement: 08/11/17 Potential to Achieve Goals: Good    Frequency Min 3X/week   Barriers to discharge        Co-evaluation               AM-PAC PT "6 Clicks" Daily Activity  Outcome Measure Difficulty turning over in bed (including adjusting bedclothes, sheets and blankets)?: Unable Difficulty moving from lying on back to sitting on the side of the bed? : Unable Difficulty sitting down on and standing up from a chair with arms (e.g., wheelchair, bedside commode, etc,.)?: A Little Help needed moving to and from a bed to chair (including a wheelchair)?: A Little Help needed walking in hospital room?: A Little Help needed climbing 3-5 steps with a railing? : A Little 6 Click Score: 14    End of Session Equipment Utilized During Treatment: Gait belt Activity Tolerance: Patient tolerated treatment well;Patient limited by lethargy Patient left: in chair Nurse Communication:  Mobility status PT Visit Diagnosis: Other abnormalities of gait and mobility (R26.89);Pain Pain - part of body: (Sternum)    Time: 3267-1245 PT Time Calculation (min) (ACUTE ONLY): 28 min   Charges:   PT Evaluation $PT Eval Moderate Complexity: 1 Mod PT Treatments $Gait Training: 8-22 mins   PT G Codes:       Mabeline Caras, PT, DPT Acute Rehab Services  Pager: [place] 08/04/2017, 10:58 AM

## 2017-08-04 NOTE — Plan of Care (Signed)
  Progressing Respiratory: Respiratory status will improve 08/04/2017 2124 - Progressing by Netta Corrigan, RN Note Pt is tolerating Room Air well with oxygen saturations in the high 90s-100. Urinary Elimination: Ability to achieve and maintain adequate renal perfusion and functioning will improve 08/04/2017 2124 - Progressing by Netta Corrigan, RN Note Pt voided 200c on two separate occasions post foley removal

## 2017-08-04 NOTE — Discharge Summary (Signed)
Physician Discharge Summary       [address]       [place] [address]             [phone]    Patient ID: [name] MRN: [id number] DOB/AGE: 1980/11/21 36 y.o.  Admit date: 07/24/2017 Discharge date: 08/08/2017  Admission Diagnoses: 1. Acute ST elevation myocardial infarction (STEMI) of inferior wall (HCC) 2. S/p PCI with stent to RCA 3. Coronary artery disease  Active Diagnoses:  1. Diabetes mellitus type 2 in obese ([place]) 2. Morbid obesity ([place]) 3. ABL anemia   Procedure (s):  Coronary/Graft Acute MI Revascularization  LEFT HEART CATH AND CORONARY ANGIOGRAPHY by Dr. Tamala Julian:  Conclusion    Acute inferior ST elevation myocardial infarction  Total occlusion of the mid right coronary.  100% stenosis reduced to 0% at the angioplasty site following stenting with a 3.0 x 20 Promus Premier postdilated to 3.25 mm in diameter reestablishing TIMI grade III flow.  Severe distal RCA/PDA/RCA continuation [place] 111 bifurcation stenosis was revealed after recanalization of the mid occlusion.  Severe proximal LAD, 95%, 80-90% mid LAD, and 70% first diagonal stenosis.  90% proximal to mid circumflex stenosis.  Inferior wall hypokinesis.  EF 55%.  Acute diastolic heart failure with EDP 20 mmHg.  RECOMMENDATIONS:   Low-dose beta-blocker therapy  High intensity statin therapy  Aspirin and Brilinta  Aggrastat times 4-hour infusion  Heart team debate multilesion three-vessel PCI versus coronary artery bypass grafting.  Will need TCTS consult.    TRANSESOPHAGEAL ECHOCARDIOGRAM (TEE), MEDIAN STERNOTOMY for CORONARY ARTERY BYPASS GRAFTING (CABG) X 5 (LIMA to LAD, LEFT RADIAL ARTERY to DIAGONAL, SVG to SEQUENTIALLY PDA and PLB) , USING LEFT INTERNAL MAMMARY ARTERY, LEFT RADIAL ARTERY, AND  SAPHENOUS VEIN to CIRCUMFLEX, RIGHT GREATER SAPHENOUS VEIN HARVESTED ENDOSCOPICALLY and OPEN LEFT RADIAL ARTERY HARVEST    History of Presenting Illness: This is a  36 year old African American female with a past medical history of diabetes mellitus (currently not on medication), obesity (s/p lap band 09') who presented to [facility] earlier this am with complaints of chest pain and shortness of breath. Apparently, she had been having these symptoms for the last several weeks, but did not seek evaluation until this am. The chest pain was described as "crushing" and she stated she also felt fluttering or palpitations. She denies any prior history of cardiac disease, denies dizziness, syncope, back or abdominal pain.   EKG showed sinus rhythm, inferior infarct, acute (RCA) probable RV involvement. Initial Troponin I was 0.03;however subsequent Troponin I was 65!Marland Kitchen She ruled in for a STEMI. Dr. Tamala Julian performed a cardiac catheterization and subsequent angioplasty with stent to the mid RCA. She was given an Aggrastat infusion as well as loaded with Brilinta. A cardiothoracic consultation was obtained with Dr. Roxy Manns for the consideration of coronary artery bypass grafting surgery. Currently, she denies chest pain and her vital signs are stable.  Dr. Roxy Manns personally reviewed the patient's chart, blood work, and diagnostic cardiac catheterization films.  There remains severe three-vessel coronary artery disease with 95% proximal stenosis of the left anterior descending coronary artery, 80-90% stenosis of the mid left anterior descending coronary artery stenosis, 70% stenosis of the first diagonal branch, 90% stenosis of the left circumflex coronary artery and high-grade stenosis of the posterior descending coronary artery and continuation of the right coronary artery at the bifurcation.  Dr. Roxy Manns felt the patient would best be treated with surgical revascularization. Because the patient was loaded  with Brilinta, surgery was delayed several days.  Dr. Roxy Manns was going to be unavailable so one of his partners followed up with the patient. Ultimately, it was Dr. Servando Snare who  saw the patient and arranged for coronary artery bypass grafting surgery on 08/02/2017. Pre operative carotid duplex US showed no significant internal carotid artery stenosis bilaterally. Potential risks, benefits, and complications of the surgery were discussed with the patient along with possibly harvesting the left radial artery during surgery. Patient underwent a CABG x 5 on 08/02/2017.   Brief Hospital Course:  The patient was extubated the evening of surgery without difficulty. He/she remained afebrile and hemodynamically stable. She was weaned of Nitroglycerin, Neo Synephrine, and Dopamine drips. Imdur was started for left radial artery graft. Gordy Councilman, a line, chest tubes, and foley were removed early in the post operative course. Lopressor was started. She was started on Plavix (stent). She was volume over loaded and diuresed. She had ABL anemia. She did not require a post op transfusion. She was started on folic acid and oral iron. Last H and H was 7.9 and 23.7. She was weaned off the insulin drip. She was maintained on Insulin. The patient's glucose remained well controlled.The patient's HGA1C pre op was 7.1. She will need close medical follow up after discharge. The patient was felt surgically stable for transfer from the ICU to PCTU for further convalescence on 08/06/2017. She continues to progress with cardiac rehab. She was ambulating on room air. She has been tolerating a diet and has had a bowel movement. Epicardial pacing wires have already been removed. Chest tube sutures will be removed the day of discharge. The patient is felt surgically stable for discharge today.   Latest Vital Signs: Blood pressure (!) 193/118, pulse 96, temperature 99.5 F (37.5 C), temperature source Oral, resp. rate 20, height 5\' 3"  (1.6 m), weight 289 lb 14.5 oz (131.5 kg), last menstrual period 07/18/2017, SpO2 100 %.  Physical Exam: Cardiovascular: RRR Pulmonary: Slightly diminished at bases Abdomen:  Soft, non tender, bowel sounds present. Extremities: Mild bilateral lower extremity edema. Left forearm motor/sensory intact Wounds: Clean and dry.  No erythema or signs of infection. Left radial artery had some slight bloody like ooze over the weekend. Dressing removed this am and no active bleeding.   Discharge Condition:Stable and discharge to home.  Recent laboratory studies:  Lab Results  Component Value Date   WBC 9.6 08/08/2017   HGB 7.9 (L) 08/08/2017   HCT 23.7 (L) 08/08/2017   MCV 82.0 08/08/2017   PLT 265 08/08/2017   Lab Results  Component Value Date   NA 136 08/08/2017   K 4.3 08/08/2017   CL 105 08/08/2017   CO2 26 08/08/2017   CREATININE 0.79 08/08/2017   GLUCOSE 150 (H) 08/08/2017    Diagnostic Studies:  EXAM: CHEST  2 VIEW  COMPARISON:  08/06/2017  FINDINGS: Prior CABG. Right PICC line remains in place, unchanged. Low lung volumes with vascular congestion, layering effusions and bibasilar atelectasis. Heart is borderline in size.  IMPRESSION: Continued layering bilateral effusions, vascular congestion and bibasilar atelectasis. Low lung volumes.   Electronically Signed   By: Rolm Baptise M.D.   On: 08/07/2017 08:06   Discharge Medications: Allergies as of 08/08/2017   No Known Allergies     Medication List    TAKE these medications   APPLE CIDER VINEGAR PO Take 2-4 mg daily by mouth.   aspirin 81 MG EC tablet Take 1 tablet (81 mg total) daily  by mouth.   atorvastatin 80 MG tablet Commonly known as:  LIPITOR Take 1 tablet (80 mg total) daily at 6 PM by mouth.   clopidogrel 75 MG tablet Commonly known as:  PLAVIX Take 1 tablet (75 mg total) daily by mouth.   colchicine 0.6 MG tablet Take 0.6 mg by mouth as needed.   DAILY HERBS BONE/JOINTS PO Take 3-5 tablets daily by mouth. Insta flex   ferrous sulfate 325 (65 FE) MG tablet Take 1 tablet (325 mg total) daily with breakfast by mouth. For one month then stop.     furosemide 40 MG tablet Commonly known as:  LASIX Take 1 tablet (40 mg total) daily by mouth. For 5 days then stop.   insulin detemir 100 UNIT/ML injection Commonly known as:  LEVEMIR Inject 0.26 mLs (26 Units total) daily into the skin.   isosorbide mononitrate 30 MG 24 hr tablet Commonly known as:  IMDUR Take 1 tablet (30 mg total) daily by mouth. For one month then stop.   metoprolol tartrate 25 MG tablet Commonly known as:  LOPRESSOR Take 1 tablet (25 mg total) 2 (two) times daily by mouth.   oxyCODONE 5 MG immediate release tablet Commonly known as:  Oxy IR/ROXICODONE Take 1 tablet (5 mg total) every 4 (four) hours as needed by mouth for severe pain.   potassium chloride SA 20 MEQ tablet Commonly known as:  K-DUR,KLOR-CON Take 1 tablet (20 mEq total) daily by mouth. For 5 days then stop.     The patient has been discharged on:   1.Beta Blocker:  Yes [ x  ]                              No   [   ]                              If No, reason:  2.Ace Inhibitor/ARB: Yes [   ]                                     No  [ x   ]                                     If No, reason:Labile BP;will try to start after discharge if BP allows  3.Statin:   Yes [x  ]                  No  [   ]                  If No, reason:  4.Ecasa:  Yes  [ x  ]                  No   [   ]                  If No, reason:  Follow Up Appointments: Follow-up Information    Maury Dus, MD. Call.   Specialty:  Family Medicine Why:  for a follow up appointment regarding furhter diabetes management and surveillance of HGA1C 7.1. Contact information: [address] [place] [address] [phone]  Grace Isaac, MD. Go on 09/15/2017.   Specialty:  Cardiothoracic Surgery Why:  PA/LAT CXR to be taken (at [facility] which is in the same building as Dr. Everrett Coombe office) on 09/15/2017 at 3:30 pm;Appointment time is at 4:00 pm Contact information: [place] [address] [phone]        Burtis Junes, NP. Go on 08/22/2017.   Specialties:  Nurse Practitioner, Interventional Cardiology, Cardiology, Radiology Why:  Appointment time is at 11:00 am Contact information: [place]. [address]  [address] [phone]           Signed: Sharalyn Ink [facility] 08/08/2017, 12:26 PM

## 2017-08-04 NOTE — Discharge Instructions (Signed)
Coronary Artery Bypass Grafting, Care After °These instructions give you information on caring for yourself after your procedure. Your doctor may also give you more specific instructions. Call your doctor if you have any problems or questions after your procedure. °Follow these instructions at home: °· Only take medicine as told by your doctor. Take medicines exactly as told. Do not stop taking medicines or start any new medicines without talking to your doctor first. °· Take your pulse as told by your doctor. °· Do deep breathing as told by your doctor. Use your breathing device (incentive spirometer), if given, to practice deep breathing several times a day. Support your chest with a pillow or your arms when you take deep breaths or cough. °· Keep the area clean, dry, and protected where the surgery cuts (incisions) were made. Remove bandages (dressings) only as told by your doctor. If strips were applied to surgical area, do not take them off. They fall off on their own. °· Check the surgery area daily for puffiness (swelling), redness, or leaking fluid. °· If surgery cuts were made in your legs: °? Avoid crossing your legs. °? Avoid sitting for long periods of time. Change positions every 30 minutes. °? Raise your legs when you are sitting. Place them on pillows. °· Wear stockings that help keep blood clots from forming in your legs (compression stockings). °· Only take sponge baths until your doctor says it is okay to take showers. Pat the surgery area dry. Do not rub the surgery area with a washcloth or towel. Do not bathe, swim, or use a hot tub until your doctor says it is okay. °· Eat foods that are high in fiber. These include raw fruits and vegetables, whole grains, beans, and nuts. Choose lean meats. Avoid canned, processed, and fried foods. °· Drink enough fluids to keep your pee (urine) clear or pale yellow. °· Weigh yourself every day. °· Rest and limit activity as told by your doctor. You may be told  to: °? Stop any activity if you have chest pain, shortness of breath, changes in heartbeat, or dizziness. Get help right away if this happens. °? Move around often for short amounts of time or take short walks as told by your doctor. Gradually become more active. You may need help to strengthen your muscles and build endurance. °? Avoid lifting, pushing, or pulling anything heavier than 10 pounds (4.5 kg) for at least 6 weeks after surgery. °· Do not drive until your doctor says it is okay. °· Ask your doctor when you can go back to work. °· Ask your doctor when you can begin sexual activity again. °· Follow up with your doctor as told. °Contact a doctor if: °· You have puffiness, redness, more pain, or fluid draining from the incision site. °· You have a fever. °· You have puffiness in your ankles or legs. °· You have pain in your legs. °· You gain 2 or more pounds (0.9 kg) a day. °· You feel sick to your stomach (nauseous) or throw up (vomit). °· You have watery poop (diarrhea). °Get help right away if: °· You have chest pain that goes to your jaw or arms. °· You have shortness of breath. °· You have a fast or irregular heartbeat. °· You notice a "clicking" in your breastbone when you move. °· You have numbness or weakness in your arms or legs. °· You feel dizzy or light-headed. °This information is not intended to replace advice given to you by   your health care provider. Make sure you discuss any questions you have with your health care provider. Document Released: 09/11/2013 Document Revised: 02/12/2016 Document Reviewed: 02/13/2013 Elsevier Interactive Patient Education  2017 [employer]. Diabetes Mellitus and Food It is important for you to manage your blood sugar (glucose) level. Your blood glucose level can be greatly affected by what you eat. Eating healthier foods in the appropriate amounts throughout the day at about the same time each day will help you control your blood glucose level. It can also  help slow or prevent worsening of your diabetes mellitus. Healthy eating may even help you improve the level of your blood pressure and reach or maintain a healthy weight. General recommendations for healthful eating and cooking habits include:  Eating meals and snacks regularly. Avoid going long periods of time without eating to lose weight.  Eating a diet that consists mainly of plant-based foods, such as fruits, vegetables, nuts, legumes, and whole grains.  Using low-heat cooking methods, such as baking, instead of high-heat cooking methods, such as deep frying.  Work with your dietitian to make sure you understand how to use the Nutrition Facts information on food labels. How can food affect me? Carbohydrates Carbohydrates affect your blood glucose level more than any other type of food. Your dietitian will help you determine how many carbohydrates to eat at each meal and teach you how to count carbohydrates. Counting carbohydrates is important to keep your blood glucose at a healthy level, especially if you are using insulin or taking certain medicines for diabetes mellitus. Alcohol Alcohol can cause sudden decreases in blood glucose (hypoglycemia), especially if you use insulin or take certain medicines for diabetes mellitus. Hypoglycemia can be a life-threatening condition. Symptoms of hypoglycemia (sleepiness, dizziness, and disorientation) are similar to symptoms of having too much alcohol. If your health care provider has given you approval to drink alcohol, do so in moderation and use the following guidelines:  Women should not have more than one drink per day, and men should not have more than two drinks per day. One drink is equal to: ? 12 oz of beer. ? 5 oz of wine. ? 1 oz of hard liquor.  Do not drink on an empty stomach.  Keep yourself hydrated. Have water, diet soda, or unsweetened iced tea.  Regular soda, juice, and other mixers might contain a lot of carbohydrates and  should be counted.  What foods are not recommended? As you make food choices, it is important to remember that all foods are not the same. Some foods have fewer nutrients per serving than other foods, even though they might have the same number of calories or carbohydrates. It is difficult to get your body what it needs when you eat foods with fewer nutrients. Examples of foods that you should avoid that are high in calories and carbohydrates but low in nutrients include:  Trans fats (most processed foods list trans fats on the Nutrition Facts label).  Regular soda.  Juice.  Candy.  Sweets, such as cake, pie, doughnuts, and cookies.  Fried foods.  What foods can I eat? Eat nutrient-rich foods, which will nourish your body and keep you healthy. The food you should eat also will depend on several factors, including:  The calories you need.  The medicines you take.  Your weight.  Your blood glucose level.  Your blood pressure level.  Your cholesterol level.  You should eat a variety of foods, including:  Protein. ? Lean cuts of  meat. ? Proteins low in saturated fats, such as fish, egg whites, and beans. Avoid processed meats.  Fruits and vegetables. ? Fruits and vegetables that may help control blood glucose levels, such as apples, mangoes, and yams.  Dairy products. ? Choose fat-free or low-fat dairy products, such as milk, yogurt, and cheese.  Grains, bread, pasta, and rice. ? Choose whole grain products, such as multigrain bread, whole oats, and brown rice. These foods may help control blood pressure.  Fats. ? Foods containing healthful fats, such as nuts, avocado, olive oil, canola oil, and fish.  Does everyone with diabetes mellitus have the same meal plan? Because every person with diabetes mellitus is different, there is not one meal plan that works for everyone. It is very important that you meet with a dietitian who will help you create a meal plan that is  just right for you. This information is not intended to replace advice given to you by your health care provider. Make sure you discuss any questions you have with your health care provider. Document Released: 06/03/2005 Document Revised: 02/12/2016 Document Reviewed: 08/03/2013 Elsevier Interactive Patient Education  2017 [employer].

## 2017-08-04 NOTE — Progress Notes (Signed)
   Awake, is in significant pain.  Vital signs are stable.  No significant arrhythmias.  Ambulating with rehab.  Overall stable post bypass course.

## 2017-08-04 NOTE — Progress Notes (Signed)
TCTS BRIEF SICU PROGRESS NOTE  2 Days Post-Op  S/P Procedure(s) (LRB): CORONARY ARTERY BYPASS GRAFTING (CABG) X 5 (LIMA to LAD, LEFT RADIAL ARTERY to DIAGONAL, SVG to SEQUENTIALLY PDA and PLB) , USING LEFT INTERNAL MAMMARY ARTERY, LEFT RADIAL ARTERY, AND  SAPHENOUS VEIN to CIRCUMFLEX, RIGHT GREATER SAPHENOUS VEIN HARVESTED ENDOSCOPICALLY (N/A) TRANSESOPHAGEAL ECHOCARDIOGRAM (TEE) (N/A) LEFT RADIAL ARTERY HARVEST (Left) STERNAL PLATING (N/A)   Stable day NSR w/ stable BP Breathing comfortably w/ O2 sats 100% on room air Excellent UOP  Plan: Continue current plan  Rexene Alberts, MD 08/04/2017 7:09 PM

## 2017-08-05 ENCOUNTER — Inpatient Hospital Stay (HOSPITAL_COMMUNITY): Payer: BC Managed Care – PPO

## 2017-08-05 LAB — GLUCOSE, CAPILLARY
Glucose-Capillary: 109 mg/dL — ABNORMAL HIGH (ref 65–99)
Glucose-Capillary: 124 mg/dL — ABNORMAL HIGH (ref 65–99)
Glucose-Capillary: 124 mg/dL — ABNORMAL HIGH (ref 65–99)
Glucose-Capillary: 130 mg/dL — ABNORMAL HIGH (ref 65–99)
Glucose-Capillary: 139 mg/dL — ABNORMAL HIGH (ref 65–99)
Glucose-Capillary: 156 mg/dL — ABNORMAL HIGH (ref 65–99)

## 2017-08-05 LAB — BASIC METABOLIC PANEL
Anion gap: 5 (ref 5–15)
BUN: 15 mg/dL (ref 6–20)
CO2: 24 mmol/L (ref 22–32)
Calcium: 7.6 mg/dL — ABNORMAL LOW (ref 8.9–10.3)
Chloride: 105 mmol/L (ref 101–111)
Creatinine, Ser: 1.21 mg/dL — ABNORMAL HIGH (ref 0.44–1.00)
GFR calc Af Amer: >60 mL/min (ref >60)
GFR calc non Af Amer: 56 mL/min — ABNORMAL LOW (ref >60)
Glucose, Bld: 116 mg/dL — ABNORMAL HIGH (ref 65–99)
Potassium: 4.1 mmol/L (ref 3.5–5.1)
Sodium: 134 mmol/L — ABNORMAL LOW (ref 135–145)

## 2017-08-05 LAB — CBC
HCT: 25.7 % — ABNORMAL LOW (ref 36.0–46.0)
Hemoglobin: 8.2 g/dL — ABNORMAL LOW (ref 12.0–15.0)
MCH: 26.5 pg (ref 26.0–34.0)
MCHC: 31.9 g/dL (ref 30.0–36.0)
MCV: 83.2 fL (ref 78.0–100.0)
Platelets: 155 10*3/uL (ref 150–400)
RBC: 3.09 MIL/uL — ABNORMAL LOW (ref 3.87–5.11)
RDW: 14.3 % (ref 11.5–15.5)
WBC: 13.7 10*3/uL — ABNORMAL HIGH (ref 4.0–10.5)

## 2017-08-05 MED ORDER — POTASSIUM CHLORIDE CRYS ER 20 MEQ PO TBCR
20.0000 meq | EXTENDED_RELEASE_TABLET | Freq: Two times a day (BID) | ORAL | Status: DC
  Administered 2017-08-05 – 2017-08-06 (×3): 20 meq via ORAL
  Filled 2017-08-05 (×3): qty 1

## 2017-08-05 MED ORDER — ASPIRIN EC 81 MG PO TBEC
81.0000 mg | DELAYED_RELEASE_TABLET | Freq: Every day | ORAL | Status: DC
  Administered 2017-08-06 – 2017-08-09 (×4): 81 mg via ORAL
  Filled 2017-08-05 (×4): qty 1

## 2017-08-05 MED ORDER — ASPIRIN 81 MG PO CHEW: 81.0000 mg | CHEWABLE_TABLET | Freq: Every day | ORAL | Status: DC

## 2017-08-05 MED ORDER — FUROSEMIDE 40 MG PO TABS
40.0000 mg | ORAL_TABLET | Freq: Two times a day (BID) | ORAL | Status: AC
  Administered 2017-08-05 (×2): 40 mg via ORAL
  Filled 2017-08-05 (×2): qty 1

## 2017-08-05 NOTE — Progress Notes (Addendum)
T[facility] DAILY ICU PROGRESS NOTE                   [name]            [place] [address]          [phone]   3 Days Post-Op Procedure(s) (LRB): CORONARY ARTERY BYPASS GRAFTING (CABG) X 5 (LIMA to LAD, LEFT RADIAL ARTERY to DIAGONAL, SVG to SEQUENTIALLY PDA and PLB) , USING LEFT INTERNAL MAMMARY ARTERY, LEFT RADIAL ARTERY, AND  SAPHENOUS VEIN to CIRCUMFLEX, RIGHT GREATER SAPHENOUS VEIN HARVESTED ENDOSCOPICALLY (N/A) TRANSESOPHAGEAL ECHOCARDIOGRAM (TEE) (N/A) LEFT RADIAL ARTERY HARVEST (Left) STERNAL PLATING (N/A)  Total Length of Stay:  LOS: 12 days   Subjective: Patient sitting in chair. She has a fair amount of sternal incisional pain.  Objective: Vital signs in last 24 hours: Temp:  [97.3 F (36.3 C)-98.4 F (36.9 C)] 98.4 F (36.9 C) (11/16 0400) Pulse Rate:  [70-86] 70 (11/16 0700) Cardiac Rhythm: Normal sinus rhythm (11/16 0740) Resp:  [15-21] 16 (11/16 0700) BP: (123-155)/(69-86) 123/75 (11/16 0400) SpO2:  [100 %] 100 % (11/16 0700) Weight:  [292 lb 15.9 oz (132.9 kg)] 292 lb 15.9 oz (132.9 kg) (11/16 0400)  Filed Weights   08/03/17 0600 08/04/17 0500 08/05/17 0400  Weight: 285 lb 7.9 oz (129.5 kg) 290 lb 2 oz (131.6 kg) 292 lb 15.9 oz (132.9 kg)    Weight change: 2 lb 13.9 oz (1.3 kg)   Intake/Output from previous day: 11/15 0701 - 11/16 0700 In: 1035.2 [P.O.:480; I.V.:555.2] Out: 950 [Urine:950]  Intake/Output this shift: No intake/output data recorded.  Current Meds: Scheduled Meds: . acetaminophen  1,000 mg Oral Q6H   Or  . acetaminophen (TYLENOL) oral liquid 160 mg/5 mL  1,000 mg Per Tube Q6H  . aspirin EC  325 mg Oral Daily   Or  . aspirin  324 mg Per Tube Daily  . atorvastatin  80 mg Oral q1800  . bisacodyl  10 mg Oral Daily   Or  . bisacodyl  10 mg Rectal Daily  . Chlorhexidine Gluconate Cloth  6 each Topical Daily  . clopidogrel  75 mg Oral Daily  . colchicine  0.6 mg Oral BID  . docusate sodium  200 mg Oral Daily  .  enoxaparin (LOVENOX) injection  40 mg Subcutaneous QHS  . folic acid  1 mg Oral Daily  . guaiFENesin  600 mg Oral BID  . insulin aspart  0-24 Units Subcutaneous Q4H  . insulin detemir  24 Units Subcutaneous Daily  . isosorbide mononitrate  30 mg Oral Daily  . metoCLOPramide (REGLAN) injection  10 mg Intravenous Q6H  . metoprolol tartrate  12.5 mg Oral BID   Or  . metoprolol tartrate  12.5 mg Per Tube BID  . pantoprazole  40 mg Oral Daily  . sodium chloride flush  10-40 mL Intracatheter Q12H   Continuous Infusions: . sodium chloride 20 mL/hr at 08/05/17 0600  . sodium chloride Stopped (08/03/17 0800)  . sodium chloride Stopped (08/02/17 2000)  . dexmedetomidine (PRECEDEX) IV infusion Stopped (08/02/17 1908)  . DOPamine 1.994 mcg/kg/min (08/05/17 0600)  . lactated ringers    . lactated ringers Stopped (08/02/17 2300)  . lactated ringers Stopped (08/03/17 1300)  . nitroGLYCERIN Stopped (08/03/17 1035)  . phenylephrine (NEO-SYNEPHRINE) Adult infusion Stopped (08/03/17 1825)   PRN Meds:.sodium chloride, ALPRAZolam, lactated ringers, metoprolol tartrate, morphine injection, ondansetron (ZOFRAN) IV, oxyCODONE, sodium chloride flush, traMADol  General appearance: cooperative, no distress and  morbidly obese Neurologic: intact Heart: RRR Lungs: Diminshed at bases Abdomen: Soft, obese, sporadic bowel sounds Extremities: Mild LE edema bilaterally. Motor/sensory intact LUE Wound: Aquacel dressing on sternum and was removed. Sternal wound is clean and dry. No sign of infection.LUE dressing removed-wound is clean and dry. RLE dressing pulled back and wound is mostly clean and dry.  Lab Results: CBC: Recent Labs    08/04/17 0430 08/05/17 0354  WBC 14.6* 13.7*  HGB 8.4* 8.2*  HCT 26.3* 25.7*  PLT 131* 155   BMET:  Recent Labs    08/04/17 0430 08/05/17 0354  NA 136 134*  K 4.3 4.1  CL 107 105  CO2 24 24  GLUCOSE 138* 116*  BUN 7 15  CREATININE 0.97 1.21*  CALCIUM 7.4* 7.6*      CMET: Lab Results  Component Value Date   WBC 13.7 (H) 08/05/2017   HGB 8.2 (L) 08/05/2017   HCT 25.7 (L) 08/05/2017   PLT 155 08/05/2017   GLUCOSE 116 (H) 08/05/2017   CHOL 114 07/26/2017   TRIG 126 07/26/2017   HDL 32 (L) 07/26/2017   LDLCALC 57 07/26/2017   ALT 30 08/01/2017   AST 29 08/01/2017   NA 134 (L) 08/05/2017   K 4.1 08/05/2017   CL 105 08/05/2017   CREATININE 1.21 (H) 08/05/2017   BUN 15 08/05/2017   CO2 24 08/05/2017   INR 1.70 08/02/2017   HGBA1C 7.1 (H) 08/01/2017     PT/INR:  Recent Labs    08/02/17 1531  LABPROT 19.8*  INR 1.70   Radiology: No results found.  Assessment/Plan: S/P Procedure(s) (LRB): CORONARY ARTERY BYPASS GRAFTING (CABG) X 5 (LIMA to LAD, LEFT RADIAL ARTERY to DIAGONAL, SVG to SEQUENTIALLY PDA and PLB) , USING LEFT INTERNAL MAMMARY ARTERY, LEFT RADIAL ARTERY, AND  SAPHENOUS VEIN to CIRCUMFLEX, RIGHT GREATER SAPHENOUS VEIN HARVESTED ENDOSCOPICALLY (N/A) TRANSESOPHAGEAL ECHOCARDIOGRAM (TEE) (N/A) LEFT RADIAL ARTERY HARVEST (Left) STERNAL PLATING (N/A)  1. CV-SR in the 70's this am. Weaned off all drips. On Lopressor 12.5 mg bid, Imdur 30 mg daily and Plavix 75 mg daily. Will consider starting ACE soon. 2. Pulmonary- On room air. CXR this am shows low lung volumes, no pneumothorax, bilateral pleural effusions edema and atelectasis, and cardiomegaly. Encourage incentive spirometer and flutter valve. 3. Volume overload-Given IV Lasix yesterday. Creatinine slightly increased to 1.21 will give oral bid today and monitor creatinine. 4. ABL anemia-H and H this am 8.2 and 25.7. Will start folic acid but not iron yet as she previously had intermittent nausea.  5. DM-CBGs 148/124/124. On scheduled Insulin 24 units daily. Will continue daily dosing for now as glucose well controlled. Pre op HGA1C 7.1. 6. PT to assist with ambulation-she must ambulate more! 7. Hope to transfer to 4E soon  Nani Skillern PA-C 08/05/2017 8:03 AM    Expected Acute  Blood - loss Anemia- continue to monitor  I have seen and examined [name] and agree with the above assessment  and plan.  Grace Isaac MD Beeper [phone] Office [phone] 08/05/2017 12:48 PM

## 2017-08-05 NOTE — Plan of Care (Signed)
  Activity: Ability to tolerate increased activity will improve 08/05/2017 0029 - Progressing by Irish Lack, RN   Pain Managment: General experience of comfort will improve 08/05/2017 0627 - Progressing by Irish Lack, RN   Clinical Measurements: Respiratory complications will improve 08/05/2017 0627 - Progressing by Irish Lack, RN   Clinical Measurements: Ability to maintain clinical measurements within normal limits will improve 08/05/2017 0627 - Progressing by Irish Lack, RN

## 2017-08-05 NOTE — Progress Notes (Signed)
CT surgery p.m. Rounds  Patient examined and record reviewed.Hemodynamics stable,labs satisfactory.Patient had stable day.Continue current care. [name] 08/05/2017

## 2017-08-06 ENCOUNTER — Inpatient Hospital Stay (HOSPITAL_COMMUNITY): Payer: BC Managed Care – PPO

## 2017-08-06 LAB — GLUCOSE, CAPILLARY
Glucose-Capillary: 117 mg/dL — ABNORMAL HIGH (ref 65–99)
Glucose-Capillary: 121 mg/dL — ABNORMAL HIGH (ref 65–99)
Glucose-Capillary: 126 mg/dL — ABNORMAL HIGH (ref 65–99)
Glucose-Capillary: 161 mg/dL — ABNORMAL HIGH (ref 65–99)
Glucose-Capillary: 80 mg/dL (ref 65–99)
Glucose-Capillary: 93 mg/dL (ref 65–99)

## 2017-08-06 LAB — BASIC METABOLIC PANEL
Anion gap: 6 (ref 5–15)
BUN: 10 mg/dL (ref 6–20)
CO2: 26 mmol/L (ref 22–32)
Calcium: 8 mg/dL — ABNORMAL LOW (ref 8.9–10.3)
Chloride: 107 mmol/L (ref 101–111)
Creatinine, Ser: 0.91 mg/dL (ref 0.44–1.00)
GFR calc Af Amer: >60 mL/min (ref >60)
GFR calc non Af Amer: >60 mL/min (ref >60)
Glucose, Bld: 101 mg/dL — ABNORMAL HIGH (ref 65–99)
Potassium: 3.5 mmol/L (ref 3.5–5.1)
Sodium: 139 mmol/L (ref 135–145)

## 2017-08-06 LAB — CBC
HCT: 25.8 % — ABNORMAL LOW (ref 36.0–46.0)
Hemoglobin: 8.3 g/dL — ABNORMAL LOW (ref 12.0–15.0)
MCH: 26.5 pg (ref 26.0–34.0)
MCHC: 32.2 g/dL (ref 30.0–36.0)
MCV: 82.4 fL (ref 78.0–100.0)
Platelets: 217 10*3/uL (ref 150–400)
RBC: 3.13 MIL/uL — ABNORMAL LOW (ref 3.87–5.11)
RDW: 14.3 % (ref 11.5–15.5)
WBC: 11.5 10*3/uL — ABNORMAL HIGH (ref 4.0–10.5)

## 2017-08-06 MED ORDER — MOVING RIGHT ALONG BOOK
Freq: Once | Status: AC
  Administered 2017-08-06: 12:00:00
  Filled 2017-08-06: qty 1

## 2017-08-06 MED ORDER — SODIUM CHLORIDE 0.9 % IV SOLN: 250.0000 mL | INTRAVENOUS | Status: DC | PRN

## 2017-08-06 MED ORDER — SODIUM CHLORIDE 0.9% FLUSH
3.0000 mL | Freq: Two times a day (BID) | INTRAVENOUS | Status: DC
  Administered 2017-08-06 – 2017-08-07 (×3): 3 mL via INTRAVENOUS

## 2017-08-06 MED ORDER — FUROSEMIDE 40 MG PO TABS
40.0000 mg | ORAL_TABLET | Freq: Every day | ORAL | Status: AC
  Administered 2017-08-06 – 2017-08-07 (×2): 40 mg via ORAL
  Filled 2017-08-06 (×2): qty 1

## 2017-08-06 MED ORDER — POTASSIUM CHLORIDE CRYS ER 20 MEQ PO TBCR: 20.0000 meq | EXTENDED_RELEASE_TABLET | ORAL | Status: DC | PRN

## 2017-08-06 MED ORDER — INSULIN ASPART 100 UNIT/ML ~~LOC~~ SOLN
0.0000 [IU] | Freq: Three times a day (TID) | SUBCUTANEOUS | Status: DC
  Administered 2017-08-06: 2 [IU] via SUBCUTANEOUS
  Administered 2017-08-07: 12 [IU] via SUBCUTANEOUS
  Administered 2017-08-07: 2 [IU] via SUBCUTANEOUS
  Administered 2017-08-07: 4 [IU] via SUBCUTANEOUS
  Administered 2017-08-08 – 2017-08-09 (×5): 2 [IU] via SUBCUTANEOUS

## 2017-08-06 MED ORDER — SODIUM CHLORIDE 0.9% FLUSH: 3.0000 mL | INTRAVENOUS | Status: DC | PRN

## 2017-08-06 MED ORDER — MAGNESIUM HYDROXIDE 400 MG/5ML PO SUSP: 30.0000 mL | Freq: Every day | ORAL | Status: DC | PRN

## 2017-08-06 MED ORDER — POTASSIUM CHLORIDE CRYS ER 20 MEQ PO TBCR
20.0000 meq | EXTENDED_RELEASE_TABLET | Freq: Every day | ORAL | Status: DC
  Administered 2017-08-06 – 2017-08-09 (×4): 20 meq via ORAL
  Filled 2017-08-06 (×4): qty 1

## 2017-08-06 NOTE — Progress Notes (Signed)
Received patient in room 4E13 from unit 2 heart,  Alert &oriented,  Room air O2 sat 100%,  Placed on telemetry  NSR,     B/P 132/90,  Incision site clean,  Pacing wires taped  &  Intact.   Mervyn Skeeters, RN

## 2017-08-06 NOTE — Progress Notes (Signed)
4 Days Post-Op Procedure(s) (LRB): CORONARY ARTERY BYPASS GRAFTING (CABG) X 5 (LIMA to LAD, LEFT RADIAL ARTERY to DIAGONAL, SVG to SEQUENTIALLY PDA and PLB) , USING LEFT INTERNAL MAMMARY ARTERY, LEFT RADIAL ARTERY, AND  SAPHENOUS VEIN to CIRCUMFLEX, RIGHT GREATER SAPHENOUS VEIN HARVESTED ENDOSCOPICALLY (N/A) TRANSESOPHAGEAL ECHOCARDIOGRAM (TEE) (N/A) LEFT RADIAL ARTERY HARVEST (Left) STERNAL PLATING (N/A) Subjective: Doing well 4 days after CABG transfer to 4 E   Objective: Vital signs in last 24 hours: Temp:  [97.7 F (36.5 C)-98.4 F (36.9 C)] 98.2 F (36.8 C) (11/17 1133) Pulse Rate:  [72-90] 81 (11/17 0700) Cardiac Rhythm: Normal sinus rhythm (11/17 0703) Resp:  [0-22] 14 (11/17 0700) BP: (119-164)/(46-90) 135/53 (11/17 0700) SpO2:  [99 %-100 %] 100 % (11/17 0700) Weight:  [287 lb (130.2 kg)] 287 lb (130.2 kg) (11/17 0500)  Hemodynamic parameters for last 24 hours:    Intake/Output from previous day: 11/16 0701 - 11/17 0700 In: 690 [P.O.:620; I.V.:70] Out: 1400 [Urine:1400] Intake/Output this shift: No intake/output data recorded.       Exam    General- alert and comfortable   Lungs- clear without rales, wheezes   Cor- regular rate and rhythm, no murmur , gallop   Abdomen- soft, non-tender   Extremities - warm, non-tender, minimal edema   Neuro- oriented, appropriate, no focal weakness   Lab Results: Recent Labs    08/05/17 0354 08/06/17 0500  WBC 13.7* 11.5*  HGB 8.2* 8.3*  HCT 25.7* 25.8*  PLT 155 217   BMET:  Recent Labs    08/05/17 0354 08/06/17 0500  NA 134* 139  K 4.1 3.5  CL 105 107  CO2 24 26  GLUCOSE 116* 101*  BUN 15 10  CREATININE 1.21* 0.91  CALCIUM 7.6* 8.0*    PT/INR: No results for input(s): LABPROT, INR in the last 72 hours. ABG    Component Value Date/Time   PHART 7.387 08/03/2017 0220   HCO3 23.5 08/03/2017 0220   TCO2 22 08/03/2017 1650   ACIDBASEDEF 1.0 08/03/2017 0220   O2SAT 97.0 08/03/2017 0220   CBG (last 3)   Recent Labs    08/06/17 0004 08/06/17 0416 08/06/17 0748  GLUCAP 93 80 121*    Assessment/Plan: S/P Procedure(s) (LRB): CORONARY ARTERY BYPASS GRAFTING (CABG) X 5 (LIMA to LAD, LEFT RADIAL ARTERY to DIAGONAL, SVG to SEQUENTIALLY PDA and PLB) , USING LEFT INTERNAL MAMMARY ARTERY, LEFT RADIAL ARTERY, AND  SAPHENOUS VEIN to CIRCUMFLEX, RIGHT GREATER SAPHENOUS VEIN HARVESTED ENDOSCOPICALLY (N/A) TRANSESOPHAGEAL ECHOCARDIOGRAM (TEE) (N/A) LEFT RADIAL ARTERY HARVEST (Left) STERNAL PLATING (N/A) Mobilize Diuresis Diabetes control treansfer to tele bed  home mon am    LOS: 13 days    [name] 08/06/2017

## 2017-08-06 NOTE — Op Note (Signed)
x

## 2017-08-06 NOTE — Op Note (Signed)
[name], [name] NO.:  [id number]  MEDICAL RECORD NO.:  [id number]  LOCATION:  2H20C                        FACILITY:  [facility]  PHYSICIAN:  Lanelle Bal, MD    DATE OF BIRTH:  1980-11-11  DATE OF PROCEDURE:  08/02/2017 DATE OF DISCHARGE:                              OPERATIVE REPORT   PREOPERATIVE DIAGNOSIS:  Coronary occlusive disease with recent inferior ST-elevation myocardial infarction with residual three-vessel coronary artery disease.  POSTOPERATIVE DIAGNOSIS:  Coronary occlusive disease with recent inferior ST-elevation myocardial infarction with residual three-vessel coronary artery disease.  SURGICAL PROCEDURES:  Coronary artery bypass graft x5 with the left internal mammary to the left anterior descending coronary artery, left radial artery to the diagonal coronary artery, reversed saphenous vein graft to the distal circumflex coronary artery, sequential reversed saphenous vein graft to the posterior descending and posterior branches of the right coronary artery with harvest of left radial artery and endo vein harvest of the right greater saphenous thigh vein and sternal plating.  SURGEON:  Lanelle Bal, MD.  FIRST ASSISTANT:  Lars Pinks, PA.  BRIEF HISTORY:  The patient is a diabetic 36 year old female, who presented the week previous with acute onset of chest pain, was diagnosed with STEMI with total occlusion of the right coronary artery, was taken to the cardiac catheterization laboratory, loaded with Brilinta and underwent angioplasty with stenting of the right coronary artery.  During the procedure, vascular access was lost.  The patient was again stabilized, but at the time of catheterization she was noted to have significant three-vessel coronary artery disease including 80- 90% stenosis of the LAD diagonal, 70% stenosis of the circumflex, residual disease in the distal right coronary artery at the bifurcation of the  PL and PD.  Overall ventricular function was mildly reduced with inferior hypokinesis.  Because of the patient's residual disease, coronary artery bypass grafting was recommended.  We waited a period of time for washout of her Brilinta.  Risks and options of surgery were discussed with her in detail.  She was agreeable and signed informed consent.  Preop Doppler studies revealed intact palmar arch on the left. The patient is right handed.  Specifically, we also talked about potential complications using radial artery harvest.  DESCRIPTION OF PROCEDURE:  With Swan-Ganz and arterial line monitors in place, the patient underwent general anesthesia.   The chest, leg, and left arm were prepped with Betadine and draped in a sterile manner.  Appropriate time-out was performed.  We then proceeded with left radial artery harvest.  A curvilinear incision was made over the volar surface of the arm over the radial artery. Dissection was carried down to the radial artery that was proximal to the wrist.  Radial artery was identified.  Again, the preserved flow in the palmar arch was preserved.  With this confirmed, we continued with the dissection of the radial artery to the proximal forearm.  The radial artery was of reasonable quality and caliber.  2000 units of heparin was administered systemically.  The proximal and distal radial arteries were then divided, flushed with heparinized saline and cannulated.  The incision was then closed with interrupted 3-0 Vicryl sutures and then a running subcuticular stitch.  We then harvested endoscopically the right greater saphenous vein from the right thigh.  This vein was of adequate quality and caliber.  Median sternotomy was performed.  Left internal mammary artery was dissected down as a pedicle graft.  The distal artery was divided, had good free flow.  The pericardium was opened.  The overall ventricular function appeared preserved with the exception  of mild inferior hypokinesis.  The patient was systemically heparinized. The ascending aorta was cannulated.  The right atrium was cannulated. Aortic root vent cardioplegia needle was introduced into the ascending aorta.  The patient was placed on cardiopulmonary bypass at 2.4 L/min/m2.  Sites of anastomosis were selected and dissected out of the epicardium.  The patient's body temperature was then cooled to 32 degrees.  Aortic crossclamp was applied.  500 mL of cold blood potassium cardioplegia was administered with diastolic arrest of the heart. Myocardial septal temperature was monitored throughout the cross-clamp. Attention was turned first to the posterior descending and posterior branches of the right coronary artery.  The posterior descending coronary artery was slightly larger.  Vessel was opened and admitted a 1.5-mm probe distally.  Using a diamond-type, a side-to-side anastomosis was carried out with a second reversed saphenous vein graft.  The distal extent of the same vein was carried a short distance to the proximal portion of the posterior lateral branch of the right coronary artery. This vessel was opened and also admitted 1.5 mm probe distally.  Using a running 7-0 Prolene distal anastomosis was performed attention was then additional cold blood cardioplegia was administered down the vein graft. Attention was then turned to the distal circumflex coronary artery. This was a relatively small vessel.  The vessel was opened, admitted a 1 mm probe distally.  Using a running 7-0 Prolene distal anastomosis was performed.  We then turned our attention to the diagonal coronary artery.  This vessel was opened, was admitted a 1.5 mm probe distally. Using a running 8-0 Prolene, the left radial artery was anastomosed to the diagonal coronary artery.  The left anterior descending coronary artery was then opened between the mid and distal third of the vessel. A 1.5-mm probe passed  distally.  Using running 8-0 Prolene, left internal mammary artery was anastomosed to the left anterior descending coronary artery.  With release of the bulldog on the mammary artery, there was rise in the myocardial septal temperature.  The bulldog was placed back on the mammary artery.  Pedicle of the mammary was tacked to the epicardium.  With crossclamp still in place, 2 punch aortotomies were performed and each of the two vein grafts were anastomosed to the ascending aorta.  The radial artery was trimmed to appropriate length and the hood of the vein graft to the circumflex was opened and the radial artery was piggybacked onto the hood of the proximal circumflex vein graft.  For full completion of the anastomosis, the heart was allowed to passively fill.  The bulldog on the mammary artery was removed with prompt rise in myocardial septal temperature.  The aortic crossclamp was removed with a total crossclamp time of 122 minutes. Sites of anastomosis were inspected and were free of bleeding.  The patient spontaneously converted to a sinus rhythm.  Sites of anastomosis were inspected.  Atrial and ventricular pacing wires were applied.  Body temperature was rewarmed to 37 degrees.  The patient was then ventilated and weaned from cardiopulmonary bypass without difficulty.  She remained hemodynamically stable, was decannulated in usual fashion.  Protamine  sulfate was administered.  With operative field hemostatic, a left pleural tube and a Blake mediastinal drain were left in place. Pericardium was loosely reapproximated.  Sternum was closed with #6 KLM. Sternal reinforcing bars were used to reinforce both the right and left side of the sternum.  Because of the patient with her weight and very thin sternum, the concern of pulling wires through was a possibility. Sternal reinforcing bars were appropriately placed and using 9-mm self tapping screws, the reinforcing bars were screwed into  place and then #6 stainless steel wires were placed around the bars and the sternum to reapproximate the sternum.  The fascia was then closed with interrupted 0 Vicryl, running 3-0 Vicryl subcutaneous tissue, 3-0 subcuticular stitch in skin edges.  Dry dressings were applied.  Sponge and needle count was correct at the completion of procedure.  Total pump time was 161 minutes.  The patient tolerated the procedure without obvious complication and was transferred to the Surgical Intensive Care for further postoperative care.  No blood products were given during the operative procedure.  The patient tolerated the procedure without obvious complication.              Lanelle Bal, MD     EG/MEDQ  D:  08/05/2017  T:  08/06/2017  Job:  [id number]

## 2017-08-06 NOTE — Progress Notes (Signed)
Pt states that she does not want to walk third walk. Pt recently treated with 10mg  Oxy for surgical pian. Will try again later.   [name] M

## 2017-08-07 ENCOUNTER — Inpatient Hospital Stay (HOSPITAL_COMMUNITY): Payer: BC Managed Care – PPO

## 2017-08-07 LAB — BASIC METABOLIC PANEL
Anion gap: 5 (ref 5–15)
BUN: 9 mg/dL (ref 6–20)
CO2: 27 mmol/L (ref 22–32)
Calcium: 8 mg/dL — ABNORMAL LOW (ref 8.9–10.3)
Chloride: 106 mmol/L (ref 101–111)
Creatinine, Ser: 0.87 mg/dL (ref 0.44–1.00)
GFR calc Af Amer: >60 mL/min (ref >60)
GFR calc non Af Amer: >60 mL/min (ref >60)
Glucose, Bld: 137 mg/dL — ABNORMAL HIGH (ref 65–99)
Potassium: 4.1 mmol/L (ref 3.5–5.1)
Sodium: 138 mmol/L (ref 135–145)

## 2017-08-07 LAB — GLUCOSE, CAPILLARY
Glucose-Capillary: 115 mg/dL — ABNORMAL HIGH (ref 65–99)
Glucose-Capillary: 188 mg/dL — ABNORMAL HIGH (ref 65–99)
Glucose-Capillary: 239 mg/dL — ABNORMAL HIGH (ref 65–99)
Glucose-Capillary: 132 mg/dL — ABNORMAL HIGH (ref 65–99)

## 2017-08-07 LAB — CBC
HCT: 24 % — ABNORMAL LOW (ref 36.0–46.0)
Hemoglobin: 7.8 g/dL — ABNORMAL LOW (ref 12.0–15.0)
MCH: 26.9 pg (ref 26.0–34.0)
MCHC: 32.5 g/dL (ref 30.0–36.0)
MCV: 82.8 fL (ref 78.0–100.0)
Platelets: 229 10*3/uL (ref 150–400)
RBC: 2.9 MIL/uL — ABNORMAL LOW (ref 3.87–5.11)
RDW: 14.4 % (ref 11.5–15.5)
WBC: 9.7 10*3/uL (ref 4.0–10.5)

## 2017-08-07 MED ORDER — ALPRAZOLAM 0.5 MG PO TABS: 0.5000 mg | ORAL_TABLET | Freq: Two times a day (BID) | ORAL | Status: DC | PRN

## 2017-08-07 MED ORDER — OXYCODONE HCL 5 MG PO TABS
5.0000 mg | ORAL_TABLET | ORAL | Status: DC | PRN
  Administered 2017-08-07 – 2017-08-09 (×8): 5 mg via ORAL
  Filled 2017-08-07 (×8): qty 1

## 2017-08-07 MED ORDER — FE FUMARATE-B12-VIT C-FA-IFC PO CAPS
1.0000 | ORAL_CAPSULE | Freq: Two times a day (BID) | ORAL | Status: DC
  Administered 2017-08-07 – 2017-08-09 (×4): 1 via ORAL
  Filled 2017-08-07 (×4): qty 1

## 2017-08-07 NOTE — Progress Notes (Addendum)
[address]       [employer],[place] [address]             [phone]      5 Days Post-Op Procedure(s) (LRB): CORONARY ARTERY BYPASS GRAFTING (CABG) X 5 (LIMA to LAD, LEFT RADIAL ARTERY to DIAGONAL, SVG to SEQUENTIALLY PDA and PLB) , USING LEFT INTERNAL MAMMARY ARTERY, LEFT RADIAL ARTERY, AND  SAPHENOUS VEIN to CIRCUMFLEX, RIGHT GREATER SAPHENOUS VEIN HARVESTED ENDOSCOPICALLY (N/A) TRANSESOPHAGEAL ECHOCARDIOGRAM (TEE) (N/A) LEFT RADIAL ARTERY HARVEST (Left) STERNAL PLATING (N/A) Subjective: The patient is feeling good this morning. Out of bed brushing her teeth.   Objective: Vital signs in last 24 hours: Temp:  [98.2 F (36.8 C)-99.8 F (37.7 C)] 98.5 F (36.9 C) (11/18 0500) Pulse Rate:  [79-88] 79 (11/18 0500) Cardiac Rhythm: Normal sinus rhythm (11/18 0740) Resp:  [16-20] 18 (11/17 1957) BP: (104-150)/(71-105) 104/71 (11/18 0500) SpO2:  [97 %-100 %] 97 % (11/18 0500) Weight:  [287 lb 7.7 oz (130.4 kg)] 287 lb 7.7 oz (130.4 kg) (11/18 0500)     Intake/Output from previous day: 11/17 0701 - 11/18 0700 In: 560 [P.O.:560] Out: 700 [Urine:700] Intake/Output this shift: No intake/output data recorded.  General appearance: alert, cooperative and no distress Heart: regular rate and rhythm, S1, S2 normal, no murmur, click, rub or gallop Lungs: clear to auscultation bilaterally Abdomen: soft, non-tender; bowel sounds normal; no masses,  no organomegaly Extremities: left radial harvest site edema, 1+ pedal edema Wound: clean and dry  Lab Results: Recent Labs    08/06/17 0500 08/07/17 0414  WBC 11.5* 9.7  HGB 8.3* 7.8*  HCT 25.8* 24.0*  PLT 217 229   BMET:  Recent Labs    08/06/17 0500 08/07/17 0414  NA 139 138  K 3.5 4.1  CL 107 106  CO2 26 27  GLUCOSE 101* 137*  BUN 10 9  CREATININE 0.91 0.87  CALCIUM 8.0* 8.0*    PT/INR: No results for input(s): LABPROT, INR in the last 72 hours. ABG    Component Value Date/Time   PHART 7.387  08/03/2017 0220   HCO3 23.5 08/03/2017 0220   TCO2 22 08/03/2017 1650   ACIDBASEDEF 1.0 08/03/2017 0220   O2SAT 97.0 08/03/2017 0220   CBG (last 3)  Recent Labs    08/06/17 1714 08/06/17 2101 08/07/17 0628  GLUCAP 161* 117* 115*    Assessment/Plan: S/P Procedure(s) (LRB): CORONARY ARTERY BYPASS GRAFTING (CABG) X 5 (LIMA to LAD, LEFT RADIAL ARTERY to DIAGONAL, SVG to SEQUENTIALLY PDA and PLB) , USING LEFT INTERNAL MAMMARY ARTERY, LEFT RADIAL ARTERY, AND  SAPHENOUS VEIN to CIRCUMFLEX, RIGHT GREATER SAPHENOUS VEIN HARVESTED ENDOSCOPICALLY (N/A) TRANSESOPHAGEAL ECHOCARDIOGRAM (TEE) (N/A) LEFT RADIAL ARTERY HARVEST (Left) STERNAL PLATING (N/A)  1. CV-NSR in the 70s, BP well controlled. The patient is on Lipitor, Lopressor, and Plavix 2. Pulm-tolerating room air with good oxygen saturation. CXR today showed low lung volumes, bilateral pleural effusions, vascular congestion, and bibasilar atelectasis.  3. Renal-creatinine 0.87, electrolytes okay. Continue Lasix 40mg  daily for fluid overload. Weight is stable.  4. Endo-blood glucose level well controlled, continue levemir and SSI 5. Expected acute blood loss anemia. H and H 7.8/24.0. Monitor. Platelets trending up.   Plan: Remove epicardial pacing wires. Watch anemia. Overall progressing well. Possibly home tomorrow.    LOS: 14 days    Elgie Collard 08/07/2017  C/o pain meds side effects- reduce doses  home in am patient examined and medical record reviewed,agree with above note. Collier Salina  Lucianne Lei Trigt III 08/07/2017

## 2017-08-08 DIAGNOSIS — Z736 Limitation of activities due to disability: Secondary | ICD-10-CM

## 2017-08-08 LAB — GLUCOSE, CAPILLARY
Glucose-Capillary: 143 mg/dL — ABNORMAL HIGH (ref 65–99)
Glucose-Capillary: 137 mg/dL — ABNORMAL HIGH (ref 65–99)
Glucose-Capillary: 148 mg/dL — ABNORMAL HIGH (ref 65–99)
Glucose-Capillary: 148 mg/dL — ABNORMAL HIGH (ref 65–99)

## 2017-08-08 LAB — CBC
HCT: 23.7 % — ABNORMAL LOW (ref 36.0–46.0)
Hemoglobin: 7.9 g/dL — ABNORMAL LOW (ref 12.0–15.0)
MCH: 27.3 pg (ref 26.0–34.0)
MCHC: 33.3 g/dL (ref 30.0–36.0)
MCV: 82 fL (ref 78.0–100.0)
Platelets: 265 10*3/uL (ref 150–400)
RBC: 2.89 MIL/uL — ABNORMAL LOW (ref 3.87–5.11)
RDW: 14.4 % (ref 11.5–15.5)
WBC: 9.6 10*3/uL (ref 4.0–10.5)

## 2017-08-08 LAB — BASIC METABOLIC PANEL
Anion gap: 5 (ref 5–15)
BUN: 6 mg/dL (ref 6–20)
CO2: 26 mmol/L (ref 22–32)
Calcium: 8.3 mg/dL — ABNORMAL LOW (ref 8.9–10.3)
Chloride: 105 mmol/L (ref 101–111)
Creatinine, Ser: 0.79 mg/dL (ref 0.44–1.00)
GFR calc Af Amer: >60 mL/min (ref >60)
GFR calc non Af Amer: >60 mL/min (ref >60)
Glucose, Bld: 150 mg/dL — ABNORMAL HIGH (ref 65–99)
Potassium: 4.3 mmol/L (ref 3.5–5.1)
Sodium: 136 mmol/L (ref 135–145)

## 2017-08-08 MED ORDER — ATORVASTATIN CALCIUM 80 MG PO TABS: 80.0000 mg | ORAL_TABLET | Freq: Every day | ORAL | 1 refills | Status: DC

## 2017-08-08 MED ORDER — CLOPIDOGREL BISULFATE 75 MG PO TABS: 75.0000 mg | ORAL_TABLET | Freq: Every day | ORAL | 1 refills | Status: DC

## 2017-08-08 MED ORDER — INSULIN DETEMIR 100 UNIT/ML ~~LOC~~ SOLN: 26.0000 [IU] | Freq: Every day | SUBCUTANEOUS | 11 refills | Status: DC

## 2017-08-08 MED ORDER — FUROSEMIDE 40 MG PO TABS
40.0000 mg | ORAL_TABLET | Freq: Every day | ORAL | Status: DC
  Administered 2017-08-08 – 2017-08-09 (×2): 40 mg via ORAL
  Filled 2017-08-08 (×2): qty 1

## 2017-08-08 MED ORDER — METOPROLOL TARTRATE 25 MG/10 ML ORAL SUSPENSION
12.5000 mg | Freq: Two times a day (BID) | ORAL | Status: DC
Start: 2017-08-08 — End: 2017-08-08

## 2017-08-08 MED ORDER — FERROUS SULFATE 325 (65 FE) MG PO TABS: 325.0000 mg | ORAL_TABLET | Freq: Every day | ORAL | 3 refills | Status: DC

## 2017-08-08 MED ORDER — METOPROLOL TARTRATE 25 MG PO TABS: 25.0000 mg | ORAL_TABLET | Freq: Two times a day (BID) | ORAL | 1 refills | Status: DC

## 2017-08-08 MED ORDER — INSULIN DETEMIR 100 UNIT/ML ~~LOC~~ SOLN
26.0000 [IU] | Freq: Every day | SUBCUTANEOUS | Status: DC
  Administered 2017-08-08 – 2017-08-09 (×2): 26 [IU] via SUBCUTANEOUS
  Filled 2017-08-08 (×2): qty 0.26

## 2017-08-08 MED ORDER — ASPIRIN 81 MG PO TBEC: 81.0000 mg | DELAYED_RELEASE_TABLET | Freq: Every day | ORAL | Status: DC

## 2017-08-08 MED ORDER — POTASSIUM CHLORIDE CRYS ER 20 MEQ PO TBCR: 20.0000 meq | EXTENDED_RELEASE_TABLET | Freq: Every day | ORAL | 0 refills | Status: DC

## 2017-08-08 MED ORDER — METOPROLOL TARTRATE 25 MG PO TABS
25.0000 mg | ORAL_TABLET | Freq: Two times a day (BID) | ORAL | Status: DC
  Administered 2017-08-08 – 2017-08-09 (×3): 25 mg via ORAL
  Filled 2017-08-08 (×3): qty 1

## 2017-08-08 MED ORDER — ISOSORBIDE MONONITRATE ER 30 MG PO TB24
30.0000 mg | ORAL_TABLET | Freq: Every day | ORAL | 0 refills | Status: DC
Start: 2017-08-08 — End: 2017-09-04

## 2017-08-08 MED ORDER — FUROSEMIDE 40 MG PO TABS: 40.0000 mg | ORAL_TABLET | Freq: Every day | ORAL | 0 refills | Status: DC

## 2017-08-08 MED ORDER — OXYCODONE HCL 5 MG PO TABS: 5.0000 mg | ORAL_TABLET | ORAL | 0 refills | Status: DC | PRN

## 2017-08-08 NOTE — Progress Notes (Signed)
CARDIAC REHAB PHASE I   PRE:  Rate/Rhythm: 102 ST  BP:  Sitting: 124/76        SaO2: 100 RA  MODE:  Ambulation: 470 ft   POST:  Rate/Rhythm: 126 ST  BP:  Sitting: 167/90         SaO2: 99 RA   Pt in chair, c/o sternal soreness, agreeable to walk. Pt ambulated 470 ft on RA, RW, slow, steady gait, tolerated well. Pt c/o DOE, sternal discomfort, tachycardic, standing rest x2. Encouraged IS, additional ambulation x2 today. Pt to recliner after walk, feet elevated, call bell within reach. Pt requested to have family present for education, states they will be here after 12pm tomorrow, will plan to complete education at that time. Will follow.   Leona, RN, BSN 08/08/2017 9:05 AM

## 2017-08-08 NOTE — Progress Notes (Signed)
Physical Therapy Treatment Patient Details Name: [name] MRN: [id number] DOB: 1981/03/27 Today's Date: 08/08/2017    History of Present Illness Pt admitted 07/24/17 with STEMI; s/p angioplasty and stent to the mid RCA with severe 3 vessel disease; now s/p CABGx5 on 08/02/17. Extubated 11/14. Pertinent PMH includes DM, obesity (s/p lap band).    PT Comments    Pt seated in chair upon arrival. Very sleepy and dozed of several times while having BP taken. BP pretreatment was 133/88 seated. HR 98 bpm and O2 sat 100% RA. Pt able to state sternal precautions and demonstrated proper technique with heart pillow. Slow controlled gait. VCs to keep eyes open as walking and for pursed lip breathing. Pt required one standing rest break before stair training; HR 113 bpm, O2 sat 93% RA. Required VCs for best sequencing on stairs and to use rail only for balance. Seated rest break after stairs as fatigued; HR 120 bpm, O2 sats 87% RA. Able to ambulate back to room and lower herself back into chair with good form. BP 141/83 seated after treatment. Pt would benefit from skilled PT to increase mobility and functional independence.  Follow Up Recommendations  No PT follow up;Supervision for mobility/OOB     Equipment Recommendations  Rolling walker with 5" wheels    Recommendations for Other Services OT consult     Precautions / Restrictions Precautions Precautions: Sternal;Fall Restrictions Weight Bearing Restrictions: Yes(Sternal Precautions)    Mobility  Bed Mobility               General bed mobility comments: Sitting in chair upon arrival.  Transfers Overall transfer level: Needs assistance Equipment used: Rolling walker (2 wheeled) Transfers: Sit to/from Stand Sit to Stand: Min guard;Mod assist;+2 physical assistance         General transfer comment: Min guard for safety. Pt able to hold heart pillow to stand and sit down. Mod assist +2 from lower seat. Min guard from  standard recliner.  Ambulation/Gait Ambulation/Gait assistance: Min guard Ambulation Distance (Feet): 100 Feet(100x2 seated rest break after stairs) Assistive device: Rolling walker (2 wheeled) Gait Pattern/deviations: Step-through pattern;Decreased stride length Gait velocity: Decreased   General Gait Details: Very controlled gait and speed. VCs for pursed lip breathing while walking. Min guard for safety and obstacle avoidance secondary to pt's habit of keeping eyes closed.   Stairs Stairs: Yes   Stair Management: One rail Right;Step to pattern;Forwards Number of Stairs: 3 General stair comments: Pt able to go up and down stairs with Min guard requiring use of handrail and increased time. VCs to use rail only for balance and for sequencing. Required seated rest break after stairs due to fatigue and O2 sat 87% RA.  Wheelchair Mobility    Modified Rankin (Stroke Patients Only)       Balance Overall balance assessment: Needs assistance Sitting-balance support: No upper extremity supported;Feet supported Sitting balance-Leahy Scale: Good     Standing balance support: Bilateral upper extremity supported Standing balance-Leahy Scale: Fair Standing balance comment: Relies on RW for support when ambulating.                            Cognition Arousal/Alertness: Suspect due to medications;Lethargic Behavior During Therapy: WFL for tasks assessed/performed Overall Cognitive Status: Within Functional Limits for tasks assessed  General Comments: Pt tired during sessions; VCs to keep eyes open while ambulating.      Exercises      General Comments        Pertinent Vitals/Pain Pain Assessment: No/denies pain Pain Intervention(s): Monitored during session;Limited activity within patient's tolerance    Home Living                      Prior Function            PT Goals (current goals can now be found  in the care plan section) Acute Rehab PT Goals Patient Stated Goal: Go home PT Goal Formulation: With patient Time For Goal Achievement: 08/11/17 Potential to Achieve Goals: Good Progress towards PT goals: Progressing toward goals    Frequency    Min 3X/week      PT Plan Current plan remains appropriate    Co-evaluation              AM-PAC PT "6 Clicks" Daily Activity  Outcome Measure  Difficulty turning over in bed (including adjusting bedclothes, sheets and blankets)?: Unable Difficulty moving from lying on back to sitting on the side of the bed? : Unable Difficulty sitting down on and standing up from a chair with arms (e.g., wheelchair, bedside commode, etc,.)?: Unable Help needed moving to and from a bed to chair (including a wheelchair)?: A Little Help needed walking in hospital room?: A Little Help needed climbing 3-5 steps with a railing? : A Little 6 Click Score: 12    End of Session Equipment Utilized During Treatment: Gait belt Activity Tolerance: Patient tolerated treatment well;Patient limited by lethargy Patient left: in chair;with call bell/phone within reach   PT Visit Diagnosis: Other abnormalities of gait and mobility (R26.89);Pain     Time: 1400-1420 PT Time Calculation (min) (ACUTE ONLY): 20 min  Charges:  $Gait Training: 8-22 mins                    G Codes:       Janna Arch, SPTA   Janna Arch 08/08/2017, 4:41 PM

## 2017-08-08 NOTE — Progress Notes (Addendum)
      [address]       [place] [address]             [phone]        6 Days Post-Op Procedure(s) (LRB): CORONARY ARTERY BYPASS GRAFTING (CABG) X 5 (LIMA to LAD, LEFT RADIAL ARTERY to DIAGONAL, SVG to SEQUENTIALLY PDA and PLB) , USING LEFT INTERNAL MAMMARY ARTERY, LEFT RADIAL ARTERY, AND  SAPHENOUS VEIN to CIRCUMFLEX, RIGHT GREATER SAPHENOUS VEIN HARVESTED ENDOSCOPICALLY (N/A) TRANSESOPHAGEAL ECHOCARDIOGRAM (TEE) (N/A) LEFT RADIAL ARTERY HARVEST (Left) STERNAL PLATING (N/A)  Subjective: Patient with sternal, incisional pain this am  Objective: Vital signs in last 24 hours: Temp:  [99.2 F (37.3 C)-99.8 F (37.7 C)] 99.6 F (37.6 C) (11/19 0423) Cardiac Rhythm: Normal sinus rhythm (11/18 1930) Resp:  [12-20] 12 (11/19 0423) BP: (116-140)/(79-80) 116/80 (11/19 0423) SpO2:  [98 %] 98 % (11/18 1400) Weight:  [289 lb 14.5 oz (131.5 kg)] 289 lb 14.5 oz (131.5 kg) (11/19 0423)  Pre op weight 128 kg Current Weight  08/08/17 289 lb 14.5 oz (131.5 kg)      Intake/Output from previous day: 11/18 0701 - 11/19 0700 In: 360 [P.O.:360] Out: 200 [Urine:200]   Physical Exam:  Cardiovascular: RRR Pulmonary: Slightly diminished at bases Abdomen: Soft, non tender, bowel sounds present. Extremities: Mild bilateral lower extremity edema. Left forearm motor/sensory intact Wounds: Clean and dry.  No erythema or signs of infection. Left radial artery had some slight bloody like ooze over the weekend. Dressing removed this am and no active bleeding.  Lab Results: CBC: Recent Labs    08/07/17 0414 08/08/17 0348  WBC 9.7 9.6  HGB 7.8* 7.9*  HCT 24.0* 23.7*  PLT 229 265   BMET:  Recent Labs    08/07/17 0414 08/08/17 0348  NA 138 136  K 4.1 4.3  CL 106 105  CO2 27 26  GLUCOSE 137* 150*  BUN 9 6  CREATININE 0.87 0.79  CALCIUM 8.0* 8.3*    PT/INR:  Lab Results  Component Value Date   INR 1.70 08/02/2017   INR 0.99 08/01/2017   INR 0.95 07/24/2017    ABG:  INR: Will add last result for INR, ABG once components are confirmed Will add last 4 CBG results once components are confirmed  Assessment/Plan: 1. CV-S/p STEMI. SR in the 90's-100's this am. On Lopressor 12.5 mg bid, Imdur 30 mg daily and Plavix 75 mg daily. Will increase Lopressor to 25 mg bid for better HR control. 2. Pulmonary- On room air. Encourage incentive spirometer and flutter valve. 3. Volume overload-Not on scheduled Lasix so will start again today 4. ABL anemia-H and H this am 7.9 and 23.7. Does not appear symptomatic.On Trinsicon. 5. DM-CBGs 239/132/143. On scheduled Insulin 24 units daily. Pre op HGA1C 7.1. 6. PT to assist with ambulation 7. Had loose stools 2 days ago so will stop stool softeners at her request 8. As discussed with Dr. Servando Snare, will discharge in am.    Sharalyn Ink [facility] 08/08/2017,7:11 AM  Patient seen, mobility improving, Plan d/c home on Tuesday I have seen and examined Chyla D Nils Pyle and agree with the above assessment  and plan.  Grace Isaac MD Beeper [phone] Office [phone] 08/09/2017 7:48 AM

## 2017-08-09 LAB — GLUCOSE, CAPILLARY: Glucose-Capillary: 136 mg/dL — ABNORMAL HIGH (ref 65–99)

## 2017-08-09 NOTE — Progress Notes (Signed)
      [address]       [place] [address]             [phone]      7 Days Post-Op Procedure(s) (LRB): CORONARY ARTERY BYPASS GRAFTING (CABG) X 5 (LIMA to LAD, LEFT RADIAL ARTERY to DIAGONAL, SVG to SEQUENTIALLY PDA and PLB) , USING LEFT INTERNAL MAMMARY ARTERY, LEFT RADIAL ARTERY, AND  SAPHENOUS VEIN to CIRCUMFLEX, RIGHT GREATER SAPHENOUS VEIN HARVESTED ENDOSCOPICALLY (N/A) TRANSESOPHAGEAL ECHOCARDIOGRAM (TEE) (N/A) LEFT RADIAL ARTERY HARVEST (Left) STERNAL PLATING (N/A)   Subjective:  Patient doing okay.  She continues to have some incisional soreness which is worse after being in the bed for a prolonged period of time.  She is ready to go home. Objective: Vital signs in last 24 hours: Temp:  [99.4 F (37.4 C)-99.9 F (37.7 C)] 99.9 F (37.7 C) (11/20 0407) Pulse Rate:  [89-102] 89 (11/20 0407) Cardiac Rhythm: Normal sinus rhythm (11/20 0700) Resp:  [21-24] 24 (11/20 0407) BP: (133-193)/(81-118) 163/94 (11/20 0407) SpO2:  [92 %-100 %] 100 % (11/20 0407) Weight:  [283 lb 4.8 oz (128.5 kg)] 283 lb 4.8 oz (128.5 kg) (11/20 0406)  Intake/Output from previous day: 11/19 0701 - 11/20 0700 In: 260 [P.O.:240; I.V.:20] Out: 1500 [Urine:1500]  General appearance: alert, cooperative and no distress Heart: regular rate and rhythm Lungs: clear to auscultation bilaterally Abdomen: soft, non-tender; bowel sounds normal; no masses,  no organomegaly Extremities: edema 1-2+ pitting Wound: clean and dry  Lab Results: Recent Labs    08/07/17 0414 08/08/17 0348  WBC 9.7 9.6  HGB 7.8* 7.9*  HCT 24.0* 23.7*  PLT 229 265   BMET:  Recent Labs    08/07/17 0414 08/08/17 0348  NA 138 136  K 4.1 4.3  CL 106 105  CO2 27 26  GLUCOSE 137* 150*  BUN 9 6  CREATININE 0.87 0.79  CALCIUM 8.0* 8.3*    PT/INR: No results for input(s): LABPROT, INR in the last 72 hours. ABG    Component Value Date/Time   PHART 7.387 08/03/2017 0220   HCO3 23.5 08/03/2017 0220   TCO2 22 08/03/2017 1650   ACIDBASEDEF 1.0 08/03/2017 0220   O2SAT 97.0 08/03/2017 0220   CBG (last 3)  Recent Labs    08/08/17 1631 08/08/17 2050 08/09/17 0628  GLUCAP 148* 148* 136*    Assessment/Plan: S/P Procedure(s) (LRB): CORONARY ARTERY BYPASS GRAFTING (CABG) X 5 (LIMA to LAD, LEFT RADIAL ARTERY to DIAGONAL, SVG to SEQUENTIALLY PDA and PLB) , USING LEFT INTERNAL MAMMARY ARTERY, LEFT RADIAL ARTERY, AND  SAPHENOUS VEIN to CIRCUMFLEX, RIGHT GREATER SAPHENOUS VEIN HARVESTED ENDOSCOPICALLY (N/A) TRANSESOPHAGEAL ECHOCARDIOGRAM (TEE) (N/A) LEFT RADIAL ARTERY HARVEST (Left) STERNAL PLATING (N/A)  1. CV- Sinus Tach, BP stable- continue Lopressor, Imdur, Plavix 2. Pulm- no acute issues, continue use of IS at discharge 3. Renal- creatinine stable, weight remains elevated, good U/O woth lasix, will continue 4. Expected post operative blood loss anemia- Hgb stable, patient tolerating, continue iron 5. DM- sugars moderately controlled, preop A1c was 7.1, continue home diabetes medicine 6. Dispo- patient stable, will d/c home today   LOS: 16 days    Elah Avellino 08/09/2017

## 2017-08-09 NOTE — Progress Notes (Signed)
Physical Therapy Treatment Patient Details Name: [name] MRN: [id number] DOB: [birth year] Today's Date: 08/09/2017    History of Present Illness Pt admitted 07/24/17 with STEMI; s/p angioplasty and stent to the mid RCA with severe 3 vessel disease; now s/p CABGx5 on 08/02/17. Extubated 11/14. Pertinent PMH includes DM, obesity (s/p lap band).   PT Comments    Pt has progressed well with mobility and fatigue much improved this session. Mod indep amb 600' with RW (4x standing rest break secondary to fatigue), and supervision for ascend/descend a flight of stairs. Able to amb with no AD and supervision as well. Reiterated sternal precautions and techniques to complete ADLs while maintaining these. Educ on importance of continued mobility at home. Has met short-term acute goals. Pt will have 24/7 assist available from family. All education completed and questions answered. Will d/c acute PT.   Follow Up Recommendations  No PT follow up;Supervision for mobility/OOB     Equipment Recommendations  Rolling walker with 5" wheels    Recommendations for Other Services       Precautions / Restrictions Precautions Precautions: Sternal;Fall    Mobility  Bed Mobility Overal bed mobility: Modified Independent                Transfers Overall transfer level: Modified independent Equipment used: None Transfers: Sit to/from Stand Sit to Stand: Modified independent (Device/Increase time)         General transfer comment: Good technique standing while hugging pillow, and sat with hands on knees  Ambulation/Gait Ambulation/Gait assistance: Modified independent (Device/Increase time) Ambulation Distance (Feet): 600 Feet Assistive device: Rolling walker (2 wheeled);None   Gait velocity: Decreased Gait velocity interpretation: <1.8 ft/sec, indicative of risk for recurrent falls General Gait Details: Great amb technique with RW, really only using it for added balance and not  actually UE support. Able to amb throughout room with no AD and supervision. 4x standing rest breaks secondary to fatigue   Stairs Stairs: Yes   Stair Management: One rail Right;Step to pattern;Forwards Number of Stairs: 11 General stair comments: Supervision for safety; educ on technique using single or bilateral UE support on rail  Wheelchair Mobility    Modified Rankin (Stroke Patients Only)       Balance Overall balance assessment: Needs assistance Sitting-balance support: No upper extremity supported;Feet supported Sitting balance-Leahy Scale: Good       Standing balance-Leahy Scale: Fair                              Cognition Arousal/Alertness: Awake/alert Behavior During Therapy: WFL for tasks assessed/performed Overall Cognitive Status: Within Functional Limits for tasks assessed                                        Exercises      General Comments General comments (skin integrity, edema, etc.): Answered pt's questions regarding sternal precautions and performing ADLs. Educ on scar desensitization and pec stretch in supine      Pertinent Vitals/Pain Pain Assessment: Faces Faces Pain Scale: Hurts a little bit Pain Location: Sternal incision Pain Descriptors / Indicators: Sore Pain Intervention(s): Monitored during session;Other (comment)(Educ on pec/chest stretch)    Home Living                      Prior Function  PT Goals (current goals can now be found in the care plan section) Acute Rehab PT Goals Patient Stated Goal: Go home PT Goal Formulation: With patient Time For Goal Achievement: 08/11/17 Potential to Achieve Goals: Good Progress towards PT goals: Progressing toward goals    Frequency    Min 3X/week      PT Plan Current plan remains appropriate    Co-evaluation              AM-PAC PT "6 Clicks" Daily Activity  Outcome Measure  Difficulty turning over in bed (including  adjusting bedclothes, sheets and blankets)?: None Difficulty moving from lying on back to sitting on the side of the bed? : None Difficulty sitting down on and standing up from a chair with arms (e.g., wheelchair, bedside commode, etc,.)?: None Help needed moving to and from a bed to chair (including a wheelchair)?: None Help needed walking in hospital room?: None Help needed climbing 3-5 steps with a railing? : A Little 6 Click Score: 23    End of Session   Activity Tolerance: Patient tolerated treatment well Patient left: in chair;with call bell/phone within reach Nurse Communication: Mobility status PT Visit Diagnosis: Other abnormalities of gait and mobility (R26.89)     Time: 2549-8264 PT Time Calculation (min) (ACUTE ONLY): 23 min  Charges:  $Gait Training: 8-22 mins $Self Care/Home Management: 8-22                    G Codes:      Mabeline Caras, PT, DPT Acute Rehab Services  Pager: [place] 08/09/2017, 9:40 AM

## 2017-08-09 NOTE — Care Management Note (Signed)
Case Management Note Marvetta Gibbons RN, BSN Unit 4E-Case Manager-- [place] coverage [phone]  Patient Details  Name: [name] MRN: [id number] Date of Birth: [birth year]  Subjective/Objective:  Pt admitted with STEMI s/p angioplasty and stent to the mid RCA with severe 3 vessel disease- CT surgery met with the her and  Plan to proceed with CABG.  Per MD note -She was given aggrastat and loaded with Brilinta after PCI so the CABG will be delayed until later this week                   Action/Plan: PTA pt lived at home with spouse- independent- CM to follow post op for d/c needs  Expected Discharge Date:  08/09/17               Expected Discharge Plan:  Home/Self Care  In-House Referral:  NA  Discharge planning Services  CM Consult, Medication Assistance  Post Acute Care Choice:  Durable Medical Equipment Choice offered to:  Patient  DME Arranged:  Walker rolling DME Agency:  [place] Arranged:  NA [place] Agency:  NA  Status of Service:  Completed, signed off  If discussed at [place] of Stay Meetings, dates discussed:    Discharge Disposition: home/self care   Additional Comments:  08/09/17- [place], CM- pt for d/c home today- needs RW for home- order placed, have notified Jermaine with [facility] for DME need- RW to be delivered to room prior to discharge. Pt's mom has taken [employer] disability paperwork  to MD office- no further CM needs noted- pt to d/c home with mom.   08/08/17- 1600- Elverta Dimiceli RN, CM- spoke with pt at bedside regarding potential d/c needs- pt checking to see if she can borrow a RW for home- will let CM know prior to d/c in am if RW is needed. No other CM needs noted for discharge.   08/01/17- 1030- Johnella Crumm RN, CM- pt for CABG on 08/02/17- after Brilinta washout- spoke with pt at bedside- she plans to stay with her mom after surgery who will be able to assist her. Questions answered about billing and such.  She has  [employer] disability paperwork at the bedside that she needs MD to fill out for her job- will see if they are willing to fill out here or if her mom needs to take by office. CM will continue to follow post op for any further d/c needs.   Dawayne Patricia, RN 08/09/2017, 11:07 AM

## 2017-08-09 NOTE — Progress Notes (Signed)
CARDIAC REHAB PHASE I   Pt in recliner. Ed started with pt. Pt had a lot of questions about going home and restrictions. RN arrived to pull pt's PIC line. Pt assisted to bed. Ed finished while pt on bed rest. Reviewed sternal precautions, restrictions, mobility around home, exercise guidelines, IS, heart healthy/diabetic diet, [facility] discharge video, washing hair/visiting beautician, and CRPII. Pt is very interested in CRPII. Pt anxious about going home. Pt encouraged to ask questions as they come to her. Pt also encouraged to watch discharge video when boyfriend arrives. Will send referral to [facility]. Pt assisted back to recliner after bed rest with feet elevated. Call bell within reach.   [phone]  Carma Lair MS, ACSM CEP  10:23 AM 08/09/2017

## 2017-08-10 ENCOUNTER — Telehealth (HOSPITAL_COMMUNITY): Payer: Self-pay

## 2017-08-10 NOTE — Telephone Encounter (Signed)
Patients insurance is active and benefits verified through [employer] - No co-pay, deductible amount of $1,250/$1,250 has been met, out of pocket amount of $4,350/$2,069.91 has been met, no co-insurance, and no pre-authorization is required. Passport/reference [id number]  Patient will be contacted and scheduled after their follow up appt with the Cardiologist office upon review by the RN Navigator.

## 2017-08-22 ENCOUNTER — Ambulatory Visit (INDEPENDENT_AMBULATORY_CARE_PROVIDER_SITE_OTHER): Payer: Self-pay

## 2017-08-22 ENCOUNTER — Other Ambulatory Visit: Payer: Self-pay

## 2017-08-22 ENCOUNTER — Ambulatory Visit (INDEPENDENT_AMBULATORY_CARE_PROVIDER_SITE_OTHER): Payer: BC Managed Care – PPO | Admitting: Nurse Practitioner

## 2017-08-22 ENCOUNTER — Encounter: Payer: Self-pay | Admitting: Nurse Practitioner

## 2017-08-22 VITALS — BP 132/90 | HR 71 | Ht 63.0 in | Wt 277.0 lb

## 2017-08-22 DIAGNOSIS — I2510 Atherosclerotic heart disease of native coronary artery without angina pectoris: Secondary | ICD-10-CM

## 2017-08-22 DIAGNOSIS — I259 Chronic ischemic heart disease, unspecified: Secondary | ICD-10-CM

## 2017-08-22 DIAGNOSIS — I2119 ST elevation (STEMI) myocardial infarction involving other coronary artery of inferior wall: Secondary | ICD-10-CM

## 2017-08-22 DIAGNOSIS — Z4802 Encounter for removal of sutures: Secondary | ICD-10-CM

## 2017-08-22 DIAGNOSIS — Z951 Presence of aortocoronary bypass graft: Secondary | ICD-10-CM

## 2017-08-22 MED ORDER — POTASSIUM CHLORIDE CRYS ER 20 MEQ PO TBCR: 20.0000 meq | EXTENDED_RELEASE_TABLET | Freq: Every day | ORAL | 1 refills | Status: DC

## 2017-08-22 MED ORDER — FUROSEMIDE 40 MG PO TABS: 40.0000 mg | ORAL_TABLET | Freq: Every day | ORAL | 1 refills | Status: DC

## 2017-08-22 MED ORDER — TRAMADOL HCL 50 MG PO TABS: 50.0000 mg | ORAL_TABLET | Freq: Four times a day (QID) | ORAL | 0 refills | Status: DC | PRN

## 2017-08-22 NOTE — Progress Notes (Signed)
Patient presents for suture removal. The wound is well healed without signs of infection.  The sutures are removed. Wound care and activity instructions given. Return prn.  Tramadol 50mg , 1 tab every 6 hours prn called into [facility].  Patient aware.

## 2017-08-22 NOTE — Patient Instructions (Addendum)
1. We will be checking the following labs today - BMET and CBC  2. WE WILL CHECK FASTING LIPID AND LIVER PANEL 09/14/17 SAME DAY YOU SEE Truitt Merle, NP   Medication Instructions:    Continue with your current medicines.   I am putting you back on lasix and potassium for the next one week - then stop    Testing/Procedures To Be Arranged:  N/A  Follow-Up:   See me in about 2 to 3 weeks with fasting labs 09/14/17 @ 9 AM WITH Truitt Merle, NP    Other Special Instructions:   I will send a message to cardiac rehab  Go to [facility] after leaving here for your RX for pain medicine and to get your stitches removed.     If you need a refill on your cardiac medications before your next appointment, please call your pharmacy.   Call the [place] office at [phone] if you have any questions, problems or concerns.

## 2017-08-22 NOTE — Progress Notes (Signed)
CARDIOLOGY OFFICE NOTE  Date:  08/22/2017    [name] Date of Birth: [birth year] Medical Record #[id number]  PCP:  Maury Dus, MD  Cardiologist:  Jennings Books [facility])  Chief Complaint  Patient presents with  . Coronary Artery Disease    Post hospital visit - seen for Dr. Tamala Julian    History of Present Illness: [name] is a 36 y.o. female who presents today for a post hospital visit. Seen for Dr. Tamala Julian (NEW).   She has a history of uncontrolled diabetes mellitus, obesity (s/p lapband 09') and previously no cardiac disease.   Presented in early November to [facility] with complaints of chest pain and shortness of breath. Apparently, she had been having these symptoms for several weeks prior, but did not seek evaluation initially. Upon her arrive, EKG showed sinus rhythm, inferior infarct, acute (RCA)probable RV involvement. Initial Troponin I was 0.03;however subsequent Troponin I was 65!Marland Kitchen She ruled in for a STEMI. Dr. Tamala Julian performed a cardiac catheterization and subsequent angioplasty with stent to the mid RCA. She was given an Aggrastat infusion as well as loaded with Brilinta. A cardiothoracic consultation was obtained with Dr. Roxy Manns for the consideration of coronary artery bypass grafting surgery due to remaining severe three-vessel coronary artery disease with 95% proximal stenosis of the left anterior descending coronary artery, 80-90% stenosis of the mid left anterior descending coronary artery stenosis, 70% stenosis of the first diagonal branch, 90% stenosis of the left circumflex coronary artery and high-grade stenosis of the posterior descending coronary artery and continuation of the right coronary artery at the bifurcation. Dr. Roxy Manns felt the patient would best be treated with surgical revascularization. Because the patient was loaded withBrilinta,surgery was delayed several days. Ultimately, it was Dr. Servando Snare who saw the patient and arranged  for coronary artery bypass grafting surgery on 08/02/2017. Pre operative carotid duplex US showed no significant internal carotid artery stenosis bilaterally. Patient underwent a CABG x 5 on 08/02/2017 with LIMA to LAD, LEFT RADIAL ARTERY to DIAGONAL, SVG to SEQUENTIALLY PDA and PLB) .  Post op course was significant for volume overload, ABL anemia, need for insulin drip and otherwise usual course.   Comes in today. Here with her mother. Has been home about 2 1/2 weeks. Doing ok. Can't sleep in the bed yet. Sleeping in a chair/ottoman. Recliner is difficult for her. Lots of swelling in her legs - moreso in the right. Has stitches in the right lower leg. Still using narcotics - asking for a refill. Rare palpitation. Not walking. No fever or chills. 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 Breathing is ok. Typical chest soreness. Bowels working ok. Appetite ok.   Past Medical History:  Diagnosis Date  . Diabetes mellitus     Past Surgical History:  Procedure Laterality Date  . CORONARY ARTERY BYPASS GRAFT N/A 08/02/2017   Procedure: CORONARY ARTERY BYPASS GRAFTING (CABG) X 5 (LIMA to LAD, LEFT RADIAL ARTERY to DIAGONAL, SVG to SEQUENTIALLY PDA and PLB) , USING LEFT INTERNAL MAMMARY ARTERY, LEFT RADIAL ARTERY, AND  SAPHENOUS VEIN to [place], RIGHT GREATER SAPHENOUS VEIN HARVESTED ENDOSCOPICALLY;  Surgeon: Grace Isaac, MD;  Location: [place];  Service: Open Heart Surgery;  Laterality: N/A;  . CORONARY/GRAFT ACUTE MI REVASCULARIZATION N/A 07/24/2017   Procedure: Coronary/Graft Acute MI Revascularization;  Surgeon: Belva Crome, MD;  Location: [place] [facility];  Service: Cardiovascular;  Laterality: N/A;  .  LAPAROSCOPIC GASTRIC BANDING  09/02/2008  . LEFT HEART CATH AND CORONARY ANGIOGRAPHY N/A 07/24/2017   Procedure: LEFT HEART CATH AND CORONARY ANGIOGRAPHY;  Surgeon: Belva Crome, MD;  Location: [place] [facility];  Service: Cardiovascular;  Laterality: N/A;  . RADIAL ARTERY HARVEST Left 08/02/2017   Procedure: LEFT RADIAL ARTERY HARVEST;  Surgeon: Grace Isaac, MD;  Location: [place];  Service: Open Heart Surgery;  Laterality: Left;  . STERNAL CLOSURE N/A 08/02/2017   Procedure: STERNAL PLATING;  Surgeon: Grace Isaac, MD;  Location: [place];  Service: Open Heart Surgery;  Laterality: N/A;  . [place]   had metal removed from stomach as a child  . TEE WITHOUT CARDIOVERSION N/A 08/02/2017   Procedure: TRANSESOPHAGEAL ECHOCARDIOGRAM (TEE);  Surgeon: Grace Isaac, MD;  Location: [place];  Service: Open Heart Surgery;  Laterality: N/A;     Medications: Current Meds  Medication Sig  . APPLE CIDER VINEGAR PO Take 2-4 mg daily by mouth.  Marland Kitchen aspirin EC 81 MG EC tablet Take 1 tablet (81 mg total) daily by mouth.  Marland Kitchen atorvastatin (LIPITOR) 80 MG tablet Take 1 tablet (80 mg total) daily at 6 PM by mouth.  . clopidogrel (PLAVIX) 75 MG tablet Take 1 tablet (75 mg total) daily by mouth.  . colchicine 0.6 MG tablet Take 0.6 mg by mouth as needed.  . ferrous sulfate 325 (65 FE) MG tablet Take 1 tablet (325 mg total) daily with breakfast by mouth. For one month then stop.  . insulin detemir (LEVEMIR) 100 UNIT/ML injection Inject 0.26 mLs (26 Units total) daily into the skin.  Marland Kitchen isosorbide mononitrate (IMDUR) 30 MG 24 hr tablet Take 1 tablet (30 mg total) daily by mouth. For one month then stop.  . metoprolol tartrate (LOPRESSOR) 25 MG tablet Take 1 tablet (25 mg total) 2 (two) times daily by mouth.  . Misc Natural Products (DAILY HERBS BONE/JOINTS PO) Take 3-5 tablets daily by mouth. Insta flex  . oxyCODONE (OXY IR/ROXICODONE) 5 MG immediate release tablet Take 1 tablet (5 mg total) every 4 (four) hours as needed by mouth for severe pain.     Allergies: No Known Allergies  Social History: The patient  reports that  has never smoked. she has never used smokeless tobacco. She reports  that she drinks alcohol. She reports that she does not use drugs.   Family History: The patient's family history includes Cancer in her maternal grandfather and maternal grandmother; Diabetes in her father; Heart disease in her father; Hypertension in her father.   Review of Systems: Please see the history of present illness.   Otherwise, the review of systems is positive for none.   All other systems are reviewed and negative.   Physical Exam: VS:  BP 132/90   Pulse 71   Ht 5\' 3"  (1.6 m)   Wt 277 lb (125.6 kg)   BMI 49.07 kg/m  .  BMI Body mass index is 49.07 kg/m.  Wt Readings from Last 3 Encounters:  08/22/17 277 lb (125.6 kg)  08/09/17 283 lb 4.8 oz (128.5 kg)  04/06/12 227 lb 3.2 oz (103.1 kg)    General: Morbidly obese. Alert and in no acute distress.   HEENT: Normal.  Neck: Supple, no JVD, carotid bruits, or masses noted.  Cardiac: Regular rate and rhythm. Sternum looks ok but delayed healing noted at the lower portion. Some chest tube steri strips in place. Right leg vein harvesting site with stitches in  place. Left arm radial site looks ok. 2+ edema.  Respiratory:  Lungs are clear but with decreased breath sounds and with normal work of breathing.  GI: Soft and nontender.  MS: No deformity or atrophy. Gait and ROM intact.  Skin: Warm and dry. Color is normal.  Neuro:  Strength and sensation are intact and no gross focal deficits noted.  Psych: Alert, appropriate and with normal affect.   LABORATORY DATA:  EKG:  EKG is ordered today. This demonstrates NSR inferior Q's, poor R wave progression noted. PVC.  Lab Results  Component Value Date   WBC 9.6 08/08/2017   HGB 7.9 (L) 08/08/2017   HCT 23.7 (L) 08/08/2017   PLT 265 08/08/2017   GLUCOSE 150 (H) 08/08/2017   CHOL 114 07/26/2017   TRIG 126 07/26/2017   HDL 32 (L) 07/26/2017   LDLCALC 57 07/26/2017   ALT 30 08/01/2017   AST 29 08/01/2017   NA 136 08/08/2017   K 4.3 08/08/2017   CL 105 08/08/2017    CREATININE 0.79 08/08/2017   BUN 6 08/08/2017   CO2 26 08/08/2017   INR 1.70 08/02/2017   HGBA1C 7.1 (H) 08/01/2017     BNP (last 3 results) No results for input(s): BNP in the last 8760 hours.  ProBNP (last 3 results) No results for input(s): PROBNP in the last 8760 hours.   Other Studies Reviewed Today:  Coronary/Graft Acute MI Revascularization  LEFT HEART CATH AND CORONARY ANGIOGRAPHY by Dr. Tamala Julian:  Conclusion    Acute inferior ST elevation myocardial infarction  Total occlusion of the mid right coronary. 100% stenosis reduced to 0% at the angioplasty site following stenting with a 3.0 x 20 Promus Premier postdilated to 3.25 mm in diameter reestablishing TIMI grade III flow. Severe distal RCA/PDA/RCA continuation [place] 111 bifurcation stenosis was revealed after recanalization of the mid occlusion.  Severe proximal LAD, 95%, 80-90% mid LAD, and 70% first diagonal stenosis.  90% proximal to mid circumflex stenosis.  Inferior wall hypokinesis. EF 55%. Acute diastolic heart failure with EDP 20 mmHg.  RECOMMENDATIONS:   Low-dose beta-blocker therapy  High intensity statin therapy  Aspirin and Brilinta  Aggrastat times 4-hour infusion  Heart team debate multilesion three-vessel PCI versus coronary artery bypass grafting. Will need TCTS consult.    TRANSESOPHAGEAL ECHOCARDIOGRAM (TEE), MEDIAN STERNOTOMY forCORONARY ARTERY BYPASS GRAFTING (CABG) X5 (LIMA to LAD, LEFT RADIAL ARTERY to DIAGONAL, SVG to SEQUENTIALLY PDA and PLB), USING LEFT INTERNAL MAMMARY ARTERY, LEFT RADIAL ARTERY, AND SAPHENOUS VEIN to [place] ENDOSCOPICALLYand OPENLEFT RADIAL ARTERY HARVEST   Assessment/Plan:  1. STEMI of the inferior wall - s/p PCI/DES to the mid RCA - followed by subsequent CABG. EF well preserved at time of cath - 55%. She remains on DAPT.   2. Recent CABG x 5 - doing ok. Still with evidence of volume overload. Still  needing narcotics. Some delayed healing of her incisions. Sending over to TCTS to get stitches removed and new RX for narcotic. Refer to cardiac rehab. Reminded about lifting restrictions. Would use peroxide over her wounds/rinse/pat dry - no creams/lotions.   3. Uncontrolled DM  4. Morbid Obesity - this is the crux of her issue - seems motivated to make the necessary changes for long term prognosis.   5. HLD - on statin therapy - lab on return  6. Post op acute blood loss anemia - lab today.   Current medicines are reviewed with the patient today.  The patient does not have concerns regarding  medicines other than what has been noted above.  The following changes have been made:  See above.  Labs/ tests ordered today include:    Orders Placed This Encounter  Procedures  . Basic metabolic panel  . CBC     Disposition:   FU with me in 4 to 6 weeks; Dr. Tamala Julian in 3 months.   Patient is agreeable to this plan and will call if any problems develop in the interim.   SignedTruitt Merle, NP  08/22/2017 11:32 AM  [facility] [address] [place], [place]  [address] Phone: [phone] Fax: [phone]

## 2017-08-23 LAB — CBC
Hematocrit: 37.1 % (ref 34.0–46.6)
Hemoglobin: 11.7 g/dL (ref 11.1–15.9)
MCH: 26.5 pg — ABNORMAL LOW (ref 26.6–33.0)
MCHC: 31.5 g/dL (ref 31.5–35.7)
MCV: 84 fL (ref 79–97)
Platelets: 544 10*3/uL — ABNORMAL HIGH (ref 150–379)
RBC: 4.41 x10E6/uL (ref 3.77–5.28)
RDW: 15.5 % — ABNORMAL HIGH (ref 12.3–15.4)
WBC: 8.6 10*3/uL (ref 3.4–10.8)

## 2017-08-23 LAB — BASIC METABOLIC PANEL
BUN/Creatinine Ratio: 12 (ref 9–23)
BUN: 9 mg/dL (ref 6–20)
CO2: 19 mmol/L — ABNORMAL LOW (ref 20–29)
Calcium: 9.4 mg/dL (ref 8.7–10.2)
Chloride: 101 mmol/L (ref 96–106)
Creatinine, Ser: 0.75 mg/dL (ref 0.57–1.00)
GFR calc Af Amer: 118 mL/min/{1.73_m2} (ref >59)
GFR calc non Af Amer: 102 mL/min/{1.73_m2} (ref >59)
Glucose: 128 mg/dL — ABNORMAL HIGH (ref 65–99)
Potassium: 4.1 mmol/L (ref 3.5–5.2)
Sodium: 138 mmol/L (ref 134–144)

## 2017-08-23 NOTE — Addendum Note (Signed)
Addended by: Michae Kava on: 08/23/2017 12:27 PM   Modules accepted: Orders

## 2017-08-24 ENCOUNTER — Telehealth (HOSPITAL_COMMUNITY): Payer: Self-pay

## 2017-08-24 NOTE — Telephone Encounter (Signed)
Called patient in regards to Cardiac Rehab - Scheduled orientation on 09/22/2017 at 1:30pm. Patient will attend the 1:15pm exc class.

## 2017-08-26 ENCOUNTER — Encounter: Payer: Self-pay | Admitting: Nurse Practitioner

## 2017-09-04 ENCOUNTER — Other Ambulatory Visit: Payer: Self-pay | Admitting: Physician Assistant

## 2017-09-05 ENCOUNTER — Telehealth: Payer: Self-pay

## 2017-09-05 NOTE — Telephone Encounter (Signed)
Ms. [name] called in this morning with questions concerning her wounds from a CABG done 08/02/2017.  She was concerned about a "whole" on one of the sites.  She stated that it looked as if it was healing and no signs of infection as described to her (swelling, red streaks, redness, pus, foul odor or smell from drainage).  She was concerned about the whole present.  Explained to her that as long as it looked to be in the healing state and no signs of infection, it was appropriate and may take a little longer to heal because of the site and what was there.  She was also told to keep the sites clean and dry and to continue to use bandages as needed to prevent rubbing of skin.  She acknowledged receipt, and was told to call back with any other questions or concerns she may have.

## 2017-09-14 ENCOUNTER — Encounter (INDEPENDENT_AMBULATORY_CARE_PROVIDER_SITE_OTHER): Payer: Self-pay

## 2017-09-14 ENCOUNTER — Other Ambulatory Visit: Payer: Self-pay | Admitting: Cardiothoracic Surgery

## 2017-09-14 ENCOUNTER — Encounter: Payer: Self-pay | Admitting: Nurse Practitioner

## 2017-09-14 ENCOUNTER — Ambulatory Visit: Payer: BC Managed Care – PPO | Admitting: Nurse Practitioner

## 2017-09-14 VITALS — BP 110/72 | HR 71 | Ht 63.0 in | Wt 270.1 lb

## 2017-09-14 DIAGNOSIS — Z951 Presence of aortocoronary bypass graft: Secondary | ICD-10-CM | POA: Diagnosis not present

## 2017-09-14 DIAGNOSIS — I259 Chronic ischemic heart disease, unspecified: Secondary | ICD-10-CM

## 2017-09-14 DIAGNOSIS — E7849 Other hyperlipidemia: Secondary | ICD-10-CM

## 2017-09-14 LAB — HEPATIC FUNCTION PANEL
ALT: 18 IU/L (ref 0–32)
AST: 16 IU/L (ref 0–40)
Albumin: 3.6 g/dL (ref 3.5–5.5)
Alkaline Phosphatase: 96 IU/L (ref 39–117)
Bilirubin Total: 0.4 mg/dL (ref 0.0–1.2)
Bilirubin, Direct: 0.16 mg/dL (ref 0.00–0.40)
Total Protein: 6.8 g/dL (ref 6.0–8.5)

## 2017-09-14 LAB — CBC
Hematocrit: 39.9 % (ref 34.0–46.6)
Hemoglobin: 13.3 g/dL (ref 11.1–15.9)
MCH: 26.2 pg — ABNORMAL LOW (ref 26.6–33.0)
MCHC: 33.3 g/dL (ref 31.5–35.7)
MCV: 79 fL (ref 79–97)
Platelets: 320 10*3/uL (ref 150–379)
RBC: 5.08 x10E6/uL (ref 3.77–5.28)
RDW: 13.8 % (ref 12.3–15.4)
WBC: 6.8 10*3/uL (ref 3.4–10.8)

## 2017-09-14 LAB — BASIC METABOLIC PANEL
BUN/Creatinine Ratio: 12 (ref 9–23)
BUN: 9 mg/dL (ref 6–20)
CO2: 22 mmol/L (ref 20–29)
Calcium: 9.2 mg/dL (ref 8.7–10.2)
Chloride: 104 mmol/L (ref 96–106)
Creatinine, Ser: 0.75 mg/dL (ref 0.57–1.00)
GFR calc Af Amer: 118 mL/min/{1.73_m2} (ref >59)
GFR calc non Af Amer: 102 mL/min/{1.73_m2} (ref >59)
Glucose: 151 mg/dL — ABNORMAL HIGH (ref 65–99)
Potassium: 4.1 mmol/L (ref 3.5–5.2)
Sodium: 141 mmol/L (ref 134–144)

## 2017-09-14 LAB — LIPID PANEL
Chol/HDL Ratio: 2.5 ratio (ref 0.0–4.4)
Cholesterol, Total: 101 mg/dL (ref 100–199)
HDL: 40 mg/dL (ref >39)
LDL Calculated: 48 mg/dL (ref 0–99)
Triglycerides: 66 mg/dL (ref 0–149)
VLDL Cholesterol Cal: 13 mg/dL (ref 5–40)

## 2017-09-14 NOTE — Addendum Note (Signed)
Addended by: Burtis Junes on: 09/14/2017 09:44 AM   Modules accepted: Orders

## 2017-09-14 NOTE — Progress Notes (Signed)
CARDIOLOGY OFFICE NOTE  Date:  09/14/2017    [name] Date of Birth: May 14, 1981 Medical Record #[id number]  PCP:  Donald Prose, MD  Cardiologist:  Jennings Books  Chief Complaint  Patient presents with  . Coronary Artery Disease    Follow up visit - seen for Dr. Tamala Julian    History of Present Illness: [name] is a 36 y.o. female who presents today for a follow up visit. Seen for Dr. Tamala Julian (NEW).   She has a history of uncontrolled diabetes mellitus, obesity (s/p lapband 09') and previously no cardiac disease.   Presented in early November to [facility] with complaints of chest pain and shortness of breath. Apparently, she had been having these symptoms for several weeks prior, but did not seek evaluation initially. Upon her arrive, EKG showed sinus rhythm, inferior infarct, acute (RCA)probable RV involvement. Initial Troponin I was 0.03;however subsequent Troponin I was 65!Marland Kitchen She ruled in for a STEMI. Dr. Tamala Julian performed a cardiac catheterization and subsequent angioplasty with stent to the mid RCA. She was given an Aggrastat infusion as well as loaded with Brilinta. A cardiothoracic consultation was obtained with Dr. Roxy Manns for the consideration of coronary artery bypass grafting surgery due to remaining severe three-vessel coronary artery disease with 95% proximal stenosis of the left anterior descending coronary artery, 80-90% stenosis of the mid left anterior descending coronary artery stenosis, 70% stenosis of the first diagonal branch, 90% stenosis of the left circumflex coronary artery and high-grade stenosis of the posterior descending coronary artery and continuation of the right coronary artery at the bifurcation.Dr. Roxy Manns feltthe patient would best be treated with surgical revascularization. Because the patient was loaded withBrilinta,surgery wasdelayed several days. Ultimately, it was Dr. Servando Snare who saw the patient and arranged for coronary  artery bypass grafting surgery on 08/02/2017. Pre operative carotid duplex US showed no significant internal carotid artery stenosis bilaterally. Patient underwent a CABG x 5 on 08/02/2017 with LIMA to LAD, LEFT RADIAL ARTERY to DIAGONAL, SVG to SEQUENTIALLY PDA and PLB) .  Post op course was significant for volume overload, ABL anemia, need for insulin drip and otherwise usual course.   I then saw her a few weeks ago for her follow up - was doing ok. Still on narcotics. Cardiac status felt to be ok but with some volume overload - I gave her another course of diuretics.   Comes in today. Here with her mother. Doing ok. Has lost weight - already down 7 pounds. Starts cardiac rehab next week - probably will not be able to do the complete program due to returning to work in February.  Had had some drainage from her sternum - this has resolved. She is seeing EBG tomorrow. No fever. Walking some. Off narcotics. Not short of breath. Mom asking about being on medicines long term.   Past Medical History:  Diagnosis Date  . Diabetes mellitus     Past Surgical History:  Procedure Laterality Date  . CORONARY ARTERY BYPASS GRAFT N/A 08/02/2017   Procedure: CORONARY ARTERY BYPASS GRAFTING (CABG) X 5 (LIMA to LAD, LEFT RADIAL ARTERY to DIAGONAL, SVG to SEQUENTIALLY PDA and PLB) , USING LEFT INTERNAL MAMMARY ARTERY, LEFT RADIAL ARTERY, AND  SAPHENOUS VEIN to [place], RIGHT GREATER SAPHENOUS VEIN HARVESTED ENDOSCOPICALLY;  Surgeon: Grace Isaac, MD;  Location: [place];  Service: Open Heart Surgery;  Laterality: N/A;  . CORONARY/GRAFT ACUTE MI REVASCULARIZATION N/A 07/24/2017   Procedure: Coronary/Graft Acute MI Revascularization;  Surgeon: Belva Crome, MD;  Location: [place] [facility];  Service: Cardiovascular;  Laterality: N/A;  . LAPAROSCOPIC GASTRIC BANDING  09/02/2008  . LEFT HEART CATH AND CORONARY ANGIOGRAPHY N/A 07/24/2017   Procedure: LEFT HEART CATH AND CORONARY ANGIOGRAPHY;  Surgeon: Belva Crome, MD;  Location: [place] [facility];  Service: Cardiovascular;  Laterality: N/A;  . RADIAL ARTERY HARVEST Left 08/02/2017   Procedure: LEFT RADIAL ARTERY HARVEST;  Surgeon: Grace Isaac, MD;  Location: [place];  Service: Open Heart Surgery;  Laterality: Left;  . STERNAL CLOSURE N/A 08/02/2017   Procedure: STERNAL PLATING;  Surgeon: Grace Isaac, MD;  Location: [place];  Service: Open Heart Surgery;  Laterality: N/A;  . [place]   had metal removed from stomach as a child  . TEE WITHOUT CARDIOVERSION N/A 08/02/2017   Procedure: TRANSESOPHAGEAL ECHOCARDIOGRAM (TEE);  Surgeon: Grace Isaac, MD;  Location: [place];  Service: Open Heart Surgery;  Laterality: N/A;     Medications: Current Meds  Medication Sig  . acetaminophen (TYLENOL) 500 MG tablet Take 500 mg by mouth every 6 (six) hours as needed for mild pain or headache.  . APPLE CIDER VINEGAR PO Take 2-4 mg daily by mouth.  Marland Kitchen aspirin EC 81 MG EC tablet Take 1 tablet (81 mg total) daily by mouth.  Marland Kitchen atorvastatin (LIPITOR) 80 MG tablet Take 1 tablet (80 mg total) daily at 6 PM by mouth.  . clopidogrel (PLAVIX) 75 MG tablet Take 1 tablet (75 mg total) daily by mouth.  . colchicine 0.6 MG tablet Take 0.6 mg by mouth as needed.  . ferrous sulfate 325 (65 FE) MG tablet Take 1 tablet (325 mg total) daily with breakfast by mouth. For one month then stop.  . insulin detemir (LEVEMIR) 100 UNIT/ML injection Inject 0.26 mLs (26 Units total) daily into the skin.  Marland Kitchen isosorbide mononitrate (IMDUR) 30 MG 24 hr tablet TAKE 1 TABLET BY MOUTH EVERY DAY  . metoprolol tartrate (LOPRESSOR) 25 MG tablet Take 1 tablet (25 mg total) 2 (two) times daily by mouth.  . Misc Natural Products (DAILY HERBS BONE/JOINTS PO) Take 3-5 tablets daily by mouth. Insta flex  . traMADol (ULTRAM) 50 MG tablet Take 1 tablet (50 mg total) by mouth every 6 (six) hours as needed.  . [DISCONTINUED] furosemide (LASIX) 40 MG tablet Take 1 tablet (40 mg  total) by mouth daily.  . [DISCONTINUED] oxyCODONE (OXY IR/ROXICODONE) 5 MG immediate release tablet Take 1 tablet (5 mg total) every 4 (four) hours as needed by mouth for severe pain.  . [DISCONTINUED] potassium chloride SA (K-DUR,KLOR-CON) 20 MEQ tablet Take 1 tablet (20 mEq total) by mouth daily.     Allergies: No Known Allergies  Social History: The patient  reports that  has never smoked. she has never used smokeless tobacco. She reports that she drinks alcohol. She reports that she does not use drugs.   Family History: The patient's family history includes Cancer in her maternal grandfather and maternal grandmother; Diabetes in her father; Heart disease in her father; Hypertension in her father.   Review of Systems: Please see the history of present illness.   Otherwise, the review of systems is positive for none.   All other systems are reviewed and negative.   Physical Exam: VS:  BP 110/72 (BP Location: Left Arm, Patient Position: Sitting, Cuff Size: Large)   Pulse 71   Ht 5\' 3"  (1.6 m)   Wt 270 lb 2.2  oz (122.5 kg)   SpO2 99% Comment: at rest  BMI 47.85 kg/m  .  BMI Body mass index is 47.85 kg/m.  Wt Readings from Last 3 Encounters:  09/14/17 270 lb 2.2 oz (122.5 kg)  08/22/17 277 lb (125.6 kg)  08/09/17 283 lb 4.8 oz (128.5 kg)    General: Pleasant. Morbidly obese. She is down 13 pounds over the past month. Alert and in no acute distress.   HEENT: Normal.  Neck: Supple, no JVD, carotid bruits, or masses noted.  Cardiac: Regular rate and rhythm. No murmurs, rubs, or gallops. No edema. Sternum looks ok - no open areas. Vein harvesting from the right leg ok - some scabs noted.  Respiratory:  Lungs are clear to auscultation bilaterally with normal work of breathing.  GI: Soft and nontender.  MS: No deformity or atrophy. Gait and ROM intact.  Skin: Warm and dry. Color is normal.  Neuro:  Strength and sensation are intact and no gross focal deficits noted.  Psych:  Alert, appropriate and with normal affect.   LABORATORY DATA:  EKG:  EKG is not ordered today.  Lab Results  Component Value Date   WBC 8.6 08/22/2017   HGB 11.7 08/22/2017   HCT 37.1 08/22/2017   PLT 544 (H) 08/22/2017   GLUCOSE 128 (H) 08/22/2017   CHOL 114 07/26/2017   TRIG 126 07/26/2017   HDL 32 (L) 07/26/2017   LDLCALC 57 07/26/2017   ALT 30 08/01/2017   AST 29 08/01/2017   NA 138 08/22/2017   K 4.1 08/22/2017   CL 101 08/22/2017   CREATININE 0.75 08/22/2017   BUN 9 08/22/2017   CO2 19 (L) 08/22/2017   INR 1.70 08/02/2017   HGBA1C 7.1 (H) 08/01/2017     BNP (last 3 results) No results for input(s): BNP in the last 8760 hours.  ProBNP (last 3 results) No results for input(s): PROBNP in the last 8760 hours.   Other Studies Reviewed Today:  Coronary/Graft Acute MI Revascularization November 2018  LEFT HEART CATH AND CORONARY ANGIOGRAPHYby Dr. Tamala Julian:  Conclusion    Acute inferior ST elevation myocardial infarction  Total occlusion of the mid right coronary. 100% stenosis reduced to 0% at the angioplasty site following stenting with a 3.0 x 20 Promus Premier postdilated to 3.25 mm in diameter reestablishing TIMI grade III flow. Severe distal RCA/PDA/RCA continuation Med[place] 111 bifurcation stenosis was revealed after recanalization of the mid occlusion.  Severe proximal LAD, 95%, 80-90% mid LAD, and 70% first diagonal stenosis.  90% proximal to mid circumflex stenosis.  Inferior wall hypokinesis. EF 55%. Acute diastolic heart failure with EDP 20 mmHg.  RECOMMENDATIONS:   Low-dose beta-blocker therapy  High intensity statin therapy  Aspirin and Brilinta  Aggrastat times 4-hour infusion  Heart team debate multilesion three-vessel PCI versus coronary artery bypass grafting. Will need TCTS consult.    TRANSESOPHAGEAL ECHOCARDIOGRAM (TEE), MEDIAN STERNOTOMY forCORONARY ARTERY BYPASS GRAFTING (CABG) X5(LIMA to LAD, LEFT RADIAL ARTERY to  DIAGONAL, SVG to SEQUENTIALLY PDA and PLB), USING LEFT INTERNAL MAMMARY ARTERY, LEFT RADIAL ARTERY, AND SAPHENOUS VEIN to [place] ENDOSCOPICALLYand OPENLEFT RADIAL ARTERY HARVEST   Assessment/Plan:  1. STEMI of the inferior wall - s/p PCI/DES to the mid RCA - followed by subsequent CABG x 5 per [facility] November 2018. EF well preserved at time of cath - 55%. She remains on DAPT. Lab today. Needs continued CV risk factor modification. Stopping Lasix and potassium today.   2. Recent CABG x 5 - doing  ok. Ok to resume driving. Stopping Imdur today.   3. Uncontrolled DM  4. Morbid Obesity - this is the crux of her issue - seems motivated to make the necessary changes for long term prognosis. Her weight is down 13 pounds over the past month. Hopefully she will be able to continue to make progress - this will take several years to accomplish.   5. HLD - on statin therapy - lab today.  6. Post op acute blood loss anemia - lab today.   Current medicines are reviewed with the patient today.  The patient does not have concerns regarding medicines other than what has been noted above.  The following changes have been made:  See above.  Labs/ tests ordered today include:   No orders of the defined types were placed in this encounter.    Disposition:   FU with Dr. Tamala Julian in about 2 months. I will be happy to see back as needed.   Patient is agreeable to this plan and will call if any problems develop in the interim.   SignedTruitt Merle, NP  09/14/2017 9:38 AM  [facility] [address] [place], [place]  [address] Phone: [phone] Fax: [phone]

## 2017-09-14 NOTE — Patient Instructions (Addendum)
We will be checking the following labs today - BMET, CBC, HPF and lipids   Medication Instructions:    Continue with your current medicines. BUT  STOP Imdur  STOP Lasix  STOP Potassium    Testing/Procedures To Be Arranged:  N/A  Follow-Up:   See Dr. Tamala Julian in about 2 months    Other Special Instructions:   N/A    If you need a refill on your cardiac medications before your next appointment, please call your pharmacy.   Call the [place] office at [phone] if you have any questions, problems or concerns.

## 2017-09-15 ENCOUNTER — Ambulatory Visit: Payer: BC Managed Care – PPO | Admitting: Cardiothoracic Surgery

## 2017-09-15 ENCOUNTER — Ambulatory Visit
Admission: RE | Admit: 2017-09-15 | Discharge: 2017-09-15 | Disposition: A | Discharge status: Home / Self Care | Payer: BC Managed Care – PPO | Source: Ambulatory Visit | Attending: Cardiothoracic Surgery | Admitting: Cardiothoracic Surgery

## 2017-09-15 ENCOUNTER — Encounter: Payer: Self-pay | Admitting: Cardiothoracic Surgery

## 2017-09-15 ENCOUNTER — Other Ambulatory Visit: Payer: Self-pay

## 2017-09-15 ENCOUNTER — Ambulatory Visit (INDEPENDENT_AMBULATORY_CARE_PROVIDER_SITE_OTHER): Payer: Self-pay | Admitting: Cardiothoracic Surgery

## 2017-09-15 ENCOUNTER — Telehealth: Payer: Self-pay | Admitting: Interventional Cardiology

## 2017-09-15 VITALS — BP 127/76 | HR 69 | Resp 18 | Ht 63.0 in | Wt 270.0 lb

## 2017-09-15 DIAGNOSIS — I2510 Atherosclerotic heart disease of native coronary artery without angina pectoris: Secondary | ICD-10-CM

## 2017-09-15 DIAGNOSIS — Z951 Presence of aortocoronary bypass graft: Secondary | ICD-10-CM

## 2017-09-15 NOTE — Progress Notes (Signed)
[address]       [place] [address]             [phone]      Roshni D Nichols [place] Medical Record #[id number] Date of Birth: 1980-12-31  Referring: Maury Dus, MD Primary Care: Donald Prose, MD Primary Cardiologist: No primary care provider on file.   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 POSTOPERATIVE DIAGNOSIS:  Coronary occlusive disease with recent inferior ST-elevation myocardial infarction with residual three-vessel coronary artery disease. SURGICAL PROCEDURES:  Coronary artery bypass graft x5 with the left internal mammary to the left anterior descending coronary artery, left radial artery to the diagonal coronary artery, reversed saphenous vein graft to the distal circumflex coronary artery, sequential reversed saphenous vein graft to the posterior descending and posterior branches of the right coronary artery with harvest of left radial artery and endo vein harvest of the right greater saphenous thigh vein and sternal plating. SURGEON:  Lanelle Bal, MD.   History of Present Illness:     Patient returns to the office after recent coronary artery bypass grafting, early postop she was somewhat slow and mobility but this seems to significantly have improved.  She is anxious to start in cardiac rehab and also to return to work.     Past Medical History:  Diagnosis Date  . Diabetes mellitus      Social History   Tobacco Use  Smoking Status Never Smoker  Smokeless Tobacco Never Used    Social History   Substance and Sexual Activity  Alcohol Use Yes   Comment: occ     No Known Allergies  Current Outpatient Medications  Medication Sig Dispense Refill  . acetaminophen (TYLENOL) 500 MG tablet Take 500 mg by mouth every 6 (six)  hours as needed for mild pain or headache.    . APPLE CIDER VINEGAR PO Take 2-4 mg daily by mouth.    Marland Kitchen aspirin EC 81 MG EC tablet Take 1 tablet (81 mg total) daily by mouth.    Marland Kitchen atorvastatin (LIPITOR) 80 MG tablet Take 1 tablet (80 mg total) daily at 6 PM by mouth. 30 tablet 1  . clopidogrel (PLAVIX) 75 MG tablet Take 1 tablet (75 mg total) daily by mouth. 30 tablet 1  . colchicine 0.6 MG tablet Take 0.6 mg by mouth as needed.    . ferrous sulfate 325 (65 FE) MG tablet Take 1 tablet (325 mg total) daily with breakfast by mouth. For one month then stop. 30 tablet 3  . insulin detemir (LEVEMIR) 100 UNIT/ML injection Inject 0.26 mLs (26 Units total) daily into the skin. 10 mL 11  . metoprolol tartrate (LOPRESSOR) 25 MG tablet Take 1 tablet (25 mg total) 2 (two) times daily by mouth. 60 tablet 1  . Misc Natural Products (DAILY HERBS BONE/JOINTS PO) Take 3-5 tablets daily by mouth. Insta flex    . traMADol (ULTRAM) 50 MG tablet Take 1 tablet (50 mg total) by mouth every 6 (six) hours as needed. 30 tablet 0   No current facility-administered medications for this visit.        Physical Exam: BP 127/76 (BP Location: Right Arm, Patient Position: Sitting, Cuff Size: Normal) Comment (Cuff Size): forearm  Pulse 69   Resp 18   Ht 5\' 3"  (1.6 m)   Wt 270 lb (122.5 kg)  SpO2 99% Comment: on RA  BMI 47.83 kg/m   General appearance: alert and cooperative Neurologic: intact Heart: regular rate and rhythm, S1, S2 normal, no murmur, click, rub or gallop Lungs: clear to auscultation bilaterally Abdomen: soft, non-tender; bowel sounds normal; no masses,  no organomegaly Extremities: extremities normal, atraumatic, no cyanosis or edema and Homans sign is negative, no sign of DVT Wound: Sternum is stable   Diagnostic Studies & Laboratory data:     Recent Radiology Findings:   Dg Chest 2 View  Result Date: 09/15/2017 CLINICAL DATA:  History of CABG. EXAM: CHEST  2 VIEW COMPARISON:  08/07/17  FINDINGS: Previous median sternotomy and CABG procedure. Normal heart size. Persistent small bilateral pleural effusions appear improved from the previous exam. No new findings. IMPRESSION: 1. Persistent but improved bilateral pleural effusions. Electronically Signed   By: Kerby Moors M.D.   On: 09/15/2017 12:25    I have independently reviewed the above radiology studies  and reviewed the findings with the patient.   Recent Lab Findings: Lab Results  Component Value Date   WBC 6.8 09/14/2017   HGB 13.3 09/14/2017   HCT 39.9 09/14/2017   PLT 320 09/14/2017   GLUCOSE 151 (H) 09/14/2017   CHOL 101 09/14/2017   TRIG 66 09/14/2017   HDL 40 09/14/2017   LDLCALC 48 09/14/2017   ALT 18 09/14/2017   AST 16 09/14/2017   NA 141 09/14/2017   K 4.1 09/14/2017   CL 104 09/14/2017   CREATININE 0.75 09/14/2017   BUN 9 09/14/2017   CO2 22 09/14/2017   INR 1.70 08/02/2017   HGBA1C 7.1 (H) 08/01/2017      Assessment / Plan:      Patient doing well following coronary artery bypass grafting I stressed to her the importance of good blood pressure and diabetic control to get the best long-term results from bypass surgery She was encouraged to enroll in a cardiac rehab program Plan to see her back as needed      Grace Isaac MD      [address] [place] [address] Office [phone]   Beeper [phone]  09/15/2017 12:42 PM

## 2017-09-15 NOTE — Telephone Encounter (Signed)
Informed pt of lab results. Pt verbalized understanding. 

## 2017-09-15 NOTE — Patient Instructions (Signed)
    [address]       [place] [address]             [phone]       Coronary Artery Bypass Grafting  Care After  Refer to this sheet in the next few weeks. These instructions provide you with information on caring for yourself after your procedure. Your caregiver may also give you more specific instructions. Your treatment has been planned according to current medical practices, but problems sometimes occur. Call your caregiver if you have any problems or questions after your procedure.  Recovery from open heart surgery will be different for everyone. Some people feel well after 3 or 4 weeks, while for others it takes longer. After heart surgery, it may be normal to:  Not have an appetite, feel nauseated by the smell of food, or only want to eat a small amount.   Be constipated because of changes in your diet, activity, and medicines. Eat foods high in fiber. Add fresh fruits and vegetables to your diet. Stool softeners may be helpful.   Feel sad or unhappy. You may be frustrated or cranky. You may have good days and bad days. Do not give up. Talk to your caregiver if you do not feel better.   Feel weakness and fatigue. You many need physical therapy or cardiac rehabilitation to get your strength back.   Develop an irregular heartbeat called atrial fibrillation. Symptoms of atrial fibrillation are a fast, irregular heartbeat or feelings of fluttery heartbeats, shortness of breath, low blood pressure, and dizziness. If these symptoms develop, see your caregiver right away.  MEDICATION  Have a list of all the medicines you will be taking when you leave the hospital. For every medicine, know the following:   Name.   Exact dose.   Time of day to be taken.   How often it should be taken.   Why you are taking it.   Ask which medicines should or should not be taken together. If you take more than one heart medicine, ask if it is okay to take them together. Some  heart medicines should not be taken at the same time because they may lower your blood pressure too much.   Narcotic pain medicine can cause constipation. Eat fresh fruits and vegetables. Add fiber to your diet. Stool softener medicine may help relieve constipation.   Keep a copy of your medicines with you at all times.   Do not add or stop taking any medicine until you check with your caregiver.   Medicines can have side effects. Call your caregiver who prescribed the medicine if you:   Start throwing up, have diarrhea, or have stomach pain.   Feel dizzy or lightheaded when you stand up.   Feel your heart is skipping beats or is beating too fast or too slow.   Develop a rash.   Notice unusual bruising or bleeding.  HOME CARE INSTRUCTIONS  After heart surgery, it is important to learn how to take your pulse. Have your caregiver show you how to take your pulse.   Use your incentive spirometer. Ask your caregiver how long after surgery you need to use it.  Care of your chest incision  Tell your caregiver right away if you notice clicking in your chest (sternum).   Support your chest with a pillow or your arms when you take deep breaths and cough.   Follow your caregiver's instructions about when you can bathe or   swim.   Protect your incision from sunlight during the first year to keep the scar from getting dark.   Tell your caregiver if you notice:   Increased tenderness of your incision.   Increased redness or swelling around your incision.   Drainage or pus from your incision.  Care of your leg incision(s)  Avoid crossing your legs.   Avoid sitting for long periods of time. Change positions every half hour.   Elevate your leg(s) when you are sitting.   Check your leg(s) daily for swelling. Check the incisions for redness or drainage.   Diet is very important to heart health.   Eat plenty of fresh fruits and vegetables. Meats should be lean cut. Avoid canned,  processed, and fried foods.   Talk to a dietician. They can teach you how to make healthy food and drink choices.  Weight  Weigh yourself every day. This is important because it helps to know if you are retaining fluid that may make your heart and lungs work harder.   Use the same scale each time.   Weigh yourself every morning at the same time. You should do this after you go to the bathroom, but before you eat breakfast.   Your weight will be more accurate if you do not wear any clothes.   Record your weight.   Tell your caregiver if you have gained 2 pounds or more overnight.  Activity Stop any activity at once if you have chest pain, shortness of breath, irregular heartbeats, or dizziness. Get help right away if you have any of these symptoms.  Bathing.  Avoid soaking in a bath or hot tub until your incisions are healed.   Rest. You need a balance of rest and activity.   Exercise. Exercise per your caregiver's advice. You may need physical therapy or cardiac rehabilitation to help strengthen your muscles and build your endurance.   Climbing stairs. Unless your caregiver tells you not to climb stairs, go up stairs slowly and rest if you tire. Do not pull yourself up by the handrail.   Driving a car. Follow your caregiver's advice on when you may drive. You may ride as a passenger at any time. When traveling for long periods of time in a car, get out of the car and walk around for a few minutes every 2 hours.   Lifting. Avoid lifting, pushing, or pulling anything heavier than 10 pounds for 6 weeks after surgery or as told by your caregiver.   Returning to work. Check with your caregiver. People heal at different rates. Most people will be able to go back to work 6 to 12 weeks after surgery.   Sexual activity. You may resume sexual relations as told by your caregiver.  SEEK MEDICAL CARE IF:  Any of your incisions are red, painful, or have any type of drainage coming from them.     You have an oral temperature above 101.5 F .   You have ankle or leg swelling.   You have pain in your legs.   You have weight gain of 2 or more pounds a day.   You feel dizzy or lightheaded when you stand up.  SEEK IMMEDIATE MEDICAL CARE IF:  You have angina or chest pain that goes to your jaw or arms. Call your local emergency services right away.   You have shortness of breath at rest or with activity.   You have a fast or irregular heartbeat (arrhythmia).   There is   a "clicking" in your sternum when you move.   You have numbness or weakness in your arms or legs.  MAKE SURE YOU:  Understand these instructions.   Will watch your condition.   Will get help right away if you are not doing well or get worse.    No lifting over 25 lbs for 3 months 

## 2017-09-15 NOTE — Telephone Encounter (Signed)
New Message   Patient is calling back about a message that she received in reference to her labs. Please call.

## 2017-09-16 ENCOUNTER — Telehealth (HOSPITAL_COMMUNITY): Payer: Self-pay

## 2017-09-19 NOTE — Addendum Note (Signed)
Addended by: Angus Seller A on: 09/19/2017 01:05 PM   Modules accepted: Orders

## 2017-09-19 NOTE — Telephone Encounter (Signed)
Cardiac Rehab Medication Review by a Pharmacist  Does the patient  feel that his/her medications are working for him/her?  yes  Has the patient been experiencing any side effects to the medications prescribed?  no  Does the patient measure his/her own blood pressure or blood glucose at home?  Checks sugars regularly but not blood pressure only occasionally. When she does check, it is in goal range.  Does the patient have any problems obtaining medications due to transportation or finances?   no  Understanding of regimen: good Understanding of indications: good Potential of compliance: good    Pharmacist comments: Patient is aware of regimen and recent changes to medications. She reports checking sugars regularly and taking meds appropriately.    Blaine Hamper Alanta Scobey 09/19/2017 12:45 PM

## 2017-09-22 ENCOUNTER — Encounter (HOSPITAL_COMMUNITY): Payer: Self-pay

## 2017-09-22 ENCOUNTER — Encounter (HOSPITAL_COMMUNITY)
Admission: RE | Admit: 2017-09-22 | Discharge: 2017-09-22 | Disposition: A | Discharge status: Home / Self Care | Payer: BC Managed Care – PPO | Source: Ambulatory Visit | Attending: Interventional Cardiology | Admitting: Interventional Cardiology

## 2017-09-22 VITALS — Ht 63.25 in | Wt 276.9 lb

## 2017-09-22 DIAGNOSIS — I2129 ST elevation (STEMI) myocardial infarction involving other sites: Secondary | ICD-10-CM | POA: Insufficient documentation

## 2017-09-22 DIAGNOSIS — Z951 Presence of aortocoronary bypass graft: Secondary | ICD-10-CM | POA: Insufficient documentation

## 2017-09-22 DIAGNOSIS — Z955 Presence of coronary angioplasty implant and graft: Secondary | ICD-10-CM | POA: Insufficient documentation

## 2017-09-22 DIAGNOSIS — I2111 ST elevation (STEMI) myocardial infarction involving right coronary artery: Secondary | ICD-10-CM

## 2017-09-22 DIAGNOSIS — Z48812 Encounter for surgical aftercare following surgery on the circulatory system: Secondary | ICD-10-CM | POA: Insufficient documentation

## 2017-09-22 NOTE — Progress Notes (Signed)
Cardiac Individual Treatment Plan  Patient Details  Name: [name] MRN: [id number] Date of Birth: July 09, 1981 Referring Provider:     [place] from 09/22/2017 in [place]  Referring Provider  Belva Crome MD       Initial Encounter Date:    CARDIAC REHAB PHASE II ORIENTATION from 09/22/2017 in [place]  Date  09/22/17  Referring Provider  Belva Crome MD       Visit Diagnosis: ST elevation myocardial infarction involving right coronary artery [facility])  Status post coronary artery stent placement  S/P CABG x 5  Patient's Home Medications on Admission:  Current Outpatient Medications:  .  acetaminophen (TYLENOL) 500 MG tablet, Take 500 mg by mouth every 6 (six) hours as needed for mild pain or headache., Disp: , Rfl:  .  APPLE CIDER VINEGAR PO, Take 2-4 mg daily by mouth., Disp: , Rfl:  .  aspirin EC 81 MG EC tablet, Take 1 tablet (81 mg total) daily by mouth., Disp: , Rfl:  .  atorvastatin (LIPITOR) 80 MG tablet, Take 1 tablet (80 mg total) daily at 6 PM by mouth., Disp: 30 tablet, Rfl: 1 .  clopidogrel (PLAVIX) 75 MG tablet, Take 1 tablet (75 mg total) daily by mouth., Disp: 30 tablet, Rfl: 1 .  colchicine 0.6 MG tablet, Take 0.6 mg by mouth as needed., Disp: , Rfl:  .  insulin detemir (LEVEMIR) 100 UNIT/ML injection, Inject 0.26 mLs (26 Units total) daily into the skin., Disp: 10 mL, Rfl: 11 .  metoprolol tartrate (LOPRESSOR) 25 MG tablet, Take 1 tablet (25 mg total) 2 (two) times daily by mouth., Disp: 60 tablet, Rfl: 1 .  Misc Natural Products (DAILY HERBS BONE/JOINTS PO), Take 3-5 tablets daily by mouth. Insta flex, Disp: , Rfl:   Past Medical History: Past Medical History:  Diagnosis Date  . Diabetes mellitus     Tobacco Use: Social History   Tobacco Use  Smoking Status Never Smoker  Smokeless Tobacco Never Used    Labs: Recent Review Flowsheet Data    Labs for  ITP Cardiac and Pulmonary Rehab Latest Ref Rng & Units 08/02/2017 08/03/2017 08/03/2017 08/03/2017 09/14/2017   Cholestrol 100 - 199 mg/dL - - - - 101   LDLCALC 0 - 99 mg/dL - - - - 48   HDL >39 mg/dL - - - - 40   Trlycerides 0 - 149 mg/dL - - - - 66   Hemoglobin A1c 4.8 - 5.6 % - - - - -   PHART 7.350 - 7.450 - 7.356 7.387 - -   PCO2ART 32.0 - 48.0 mmHg - 41.3 39.4 - -   HCO3 20.0 - 28.0 mmol/L - 22.9 23.5 - -   TCO2 22 - 32 mmol/L 24 24 25 22  -   ACIDBASEDEF 0.0 - 2.0 mmol/L - 2.0 1.0 - -   O2SAT % - 99.0 97.0 - -      Capillary Blood Glucose: Lab Results  Component Value Date   GLUCAP 136 (H) 08/09/2017   GLUCAP 148 (H) 08/08/2017   GLUCAP 148 (H) 08/08/2017   GLUCAP 137 (H) 08/08/2017   GLUCAP 143 (H) 08/08/2017     Exercise Target Goals: Date: 09/22/17  Exercise Program Goal: Individual exercise prescription set with THRR, safety & activity barriers. Participant demonstrates ability to understand and report RPE using BORG scale, to self-measure pulse accurately, and to acknowledge the importance of the  exercise prescription.  Exercise Prescription Goal: Starting with aerobic activity 30 plus minutes a day, 3 days per week for initial exercise prescription. Provide home exercise prescription and guidelines that participant acknowledges understanding prior to discharge.  Activity Barriers & Risk Stratification: Activity Barriers & Cardiac Risk Stratification - 09/22/17 1445      Activity Barriers & Cardiac Risk Stratification   Activity Barriers  Deconditioning;Muscular Weakness;Other (comment)    Comments  Bilateral Chronic Knee Pain     Cardiac Risk Stratification  High       6 Minute Walk: 6 Minute Walk    Row Name 09/22/17 1526         6 Minute Walk   Phase  Initial     Distance  1297 feet     Walk Time  6 minutes     # of Rest Breaks  0     MPH  2.46     METS  3.48     RPE  12     Perceived Dyspnea   1     Symptoms  Yes (comment)     Comments  Mild  Shortness of Breath +1     Resting HR  66 bpm     Resting BP  118/70     Resting Oxygen Saturation   100 %     Exercise Oxygen Saturation  during 6 min walk  100 %     Max Ex. HR  95 bpm     Max Ex. BP  130/72     2 Minute Post BP  112/68        Oxygen Initial Assessment:   Oxygen Re-Evaluation:   Oxygen Discharge (Final Oxygen Re-Evaluation):   Initial Exercise Prescription: Initial Exercise Prescription - 09/22/17 1500      Date of Initial Exercise RX and Referring Provider   Date  09/22/17    Referring Provider  Belva Crome MD       Recumbant Bike   Level  2    RPM  70    Watts  30    Minutes  15    METs  2.76      NuStep   Level  2    SPM  70    Minutes  15    METs  2.8      Track   Laps  14    Minutes  15    METs  2.62      Prescription Details   Frequency (times per week)  3x    Duration  Progress to 30 minutes of continuous aerobic without signs/symptoms of physical distress      Intensity   THRR 40-80% of Max Heartrate  73-146    Ratings of Perceived Exertion  11-15    Perceived Dyspnea  0-4      Progression   Progression  Continue progressive overload as per policy without signs/symptoms or physical distress.      Resistance Training   Training Prescription  Yes    Weight  2lbs    Reps  10-15       Perform Capillary Blood Glucose checks as needed.  Exercise Prescription Changes:   Exercise Comments:   Exercise Goals and Review: Exercise Goals    Row Name 09/22/17 1530             Exercise Goals   Increase Physical Activity  Yes       Intervention  Provide advice, education, support and  counseling about physical activity/exercise needs.;Develop an individualized exercise prescription for aerobic and resistive training based on initial evaluation findings, risk stratification, comorbidities and participant's personal goals.       Expected Outcomes  Achievement of increased cardiorespiratory fitness and enhanced flexibility,  muscular endurance and strength shown through measurements of functional capacity and personal statement of participant.       Increase Strength and Stamina  Yes       Intervention  Provide advice, education, support and counseling about physical activity/exercise needs.;Develop an individualized exercise prescription for aerobic and resistive training based on initial evaluation findings, risk stratification, comorbidities and participant's personal goals.       Expected Outcomes  Achievement of increased cardiorespiratory fitness and enhanced flexibility, muscular endurance and strength shown through measurements of functional capacity and personal statement of participant.       Able to understand and use rate of perceived exertion (RPE) scale  Yes       Intervention  Provide education and explanation on how to use RPE scale       Expected Outcomes  Short Term: Able to use RPE daily in rehab to express subjective intensity level;Long Term:  Able to use RPE to guide intensity level when exercising independently       Knowledge and understanding of Target Heart Rate Range (THRR)  Yes       Intervention  Provide education and explanation of THRR including how the numbers were predicted and where they are located for reference       Expected Outcomes  Short Term: Able to state/look up THRR;Short Term: Able to use daily as guideline for intensity in rehab;Long Term: Able to use THRR to govern intensity when exercising independently       Able to check pulse independently  Yes       Intervention  Provide education and demonstration on how to check pulse in carotid and radial arteries.;Review the importance of being able to check your own pulse for safety during independent exercise       Expected Outcomes  Short Term: Able to explain why pulse checking is important during independent exercise;Long Term: Able to check pulse independently and accurately       Understanding of Exercise Prescription  Yes        Intervention  Provide education, explanation, and written materials on patient's individual exercise prescription       Expected Outcomes  Short Term: Able to explain program exercise prescription;Long Term: Able to explain home exercise prescription to exercise independently          Exercise Goals Re-Evaluation :    Discharge Exercise Prescription (Final Exercise Prescription Changes):   Nutrition:  Target Goals: Understanding of nutrition guidelines, daily intake of sodium 1500mg , cholesterol 200mg , calories 30% from fat and 7% or less from saturated fats, daily to have 5 or more servings of fruits and vegetables.  Biometrics: Pre Biometrics - 09/22/17 1527      Pre Biometrics   Height  5' 3.25" (1.607 m)    Weight  276 lb 14.4 oz (125.6 kg)    Waist Circumference  48.5 inches    Hip Circumference  50.5 inches    Waist to Hip Ratio  0.96 %    BMI (Calculated)  48.64    Triceps Skinfold  40 mm    % Body Fat  55.2 %    Grip Strength  32 kg    Flexibility  14 in    Single  Leg Stand  30 seconds        Nutrition Therapy Plan and Nutrition Goals:   Nutrition Discharge: Nutrition Scores:   Nutrition Goals Re-Evaluation:   Nutrition Goals Re-Evaluation:   Nutrition Goals Discharge (Final Nutrition Goals Re-Evaluation):   Psychosocial: Target Goals: Acknowledge presence or absence of significant depression and/or stress, maximize coping skills, provide positive support system. Participant is able to verbalize types and ability to use techniques and skills needed for reducing stress and depression.  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  Pt has meetings with parents that can become tensed and pt is non  confrontational by nature.      Family Dynamics   Good Support System?  Yes      Barriers   Psychosocial barriers to participate in program  The patient should benefit from training in stress management and relaxation.      Screening Interventions   Interventions  Encouraged to exercise       Quality of Life Scores: Quality of Life - 09/22/17 1529      Quality of Life Scores   Health/Function Pre  24 %    Socioeconomic Pre  24.79 %    Psych/Spiritual Pre  23.14 %    Family Pre  22.5 %    GLOBAL Pre  23.8 %       PHQ-9: Recent Review Flowsheet Data    There is no flowsheet data to display.     Interpretation of Total Score  Total Score Depression Severity:  1-4 = Minimal depression, 5-9 = Mild depression, 10-14 = Moderate depression, 15-19 = Moderately severe depression, 20-27 = Severe depression   Psychosocial Evaluation and Intervention:   Psychosocial Re-Evaluation:   Psychosocial Discharge (Final Psychosocial Re-Evaluation):   Vocational Rehabilitation: Provide vocational rehab assistance to qualifying candidates.   Vocational Rehab Evaluation & Intervention: Vocational Rehab - 09/22/17 1623      Initial Vocational Rehab Evaluation & Intervention   Assessment shows need for Vocational Rehabilitation  Yes       Education: Education Goals: Education classes will be provided on a weekly basis, covering required topics. Participant will state understanding/return demonstration of topics presented.  Learning Barriers/Preferences: Learning Barriers/Preferences - 09/22/17 1528      Learning Barriers/Preferences   Learning Barriers  None    Learning Preferences  Skilled Demonstration;Written Material;Video;Verbal Instruction;Pictoral       Education Topics: Count Your Pulse:  -Group instruction provided by verbal instruction, demonstration, patient participation and written materials to support subject.  Instructors address importance of being able to  find your pulse and how to count your pulse when at home without a heart monitor.  Patients get hands on experience counting their pulse with staff help and individually.   Heart Attack, Angina, and Risk Factor Modification:  -Group instruction provided by verbal instruction, video, and written materials to support subject.  Instructors address signs and symptoms of angina and heart attacks.    Also discuss risk factors for heart disease and how to make changes to improve heart health risk factors.   Functional Fitness:  -Group instruction provided by verbal instruction, demonstration, patient participation, and written materials to support subject.  Instructors address safety measures for doing things  around the house.  Discuss how to get up and down off the floor, how to pick things up properly, how to safely get out of a chair without assistance, and balance training.   Meditation and Mindfulness:  -Group instruction provided by verbal instruction, patient participation, and written materials to support subject.  Instructor addresses importance of mindfulness and meditation practice to help reduce stress and improve awareness.  Instructor also leads participants through a meditation exercise.    Stretching for Flexibility and Mobility:  -Group instruction provided by verbal instruction, patient participation, and written materials to support subject.  Instructors lead participants through series of stretches that are designed to increase flexibility thus improving mobility.  These stretches are additional exercise for major muscle groups that are typically performed during regular warm up and cool down.   Hands Only CPR:  -Group verbal, video, and participation provides a basic overview of AHA guidelines for community CPR. Role-play of emergencies allow participants the opportunity to practice calling for help and chest compression technique with discussion of AED  use.   Hypertension: -Group verbal and written instruction that provides a basic overview of hypertension including the most recent diagnostic guidelines, risk factor reduction with self-care instructions and medication management.    Nutrition I class: Heart Healthy Eating:  -Group instruction provided by PowerPoint slides, verbal discussion, and written materials to support subject matter. The instructor gives an explanation and review of the Therapeutic Lifestyle Changes diet recommendations, which includes a discussion on lipid goals, dietary fat, sodium, fiber, plant stanol/sterol esters, sugar, and the components of a well-balanced, healthy diet.   Nutrition II class: Lifestyle Skills:  -Group instruction provided by PowerPoint slides, verbal discussion, and written materials to support subject matter. The instructor gives an explanation and review of label reading, grocery shopping for heart health, heart healthy recipe modifications, and ways to make healthier choices when eating out.   Diabetes Question & Answer:  -Group instruction provided by PowerPoint slides, verbal discussion, and written materials to support subject matter. The instructor gives an explanation and review of diabetes co-morbidities, pre- and post-prandial blood glucose goals, pre-exercise blood glucose goals, signs, symptoms, and treatment of hypoglycemia and hyperglycemia, and foot care basics.   Diabetes Blitz:  -Group instruction provided by PowerPoint slides, verbal discussion, and written materials to support subject matter. The instructor gives an explanation and review of the physiology behind type 1 and type 2 diabetes, diabetes medications and rational behind using different medications, pre- and post-prandial blood glucose recommendations and Hemoglobin A1c goals, diabetes diet, and exercise including blood glucose guidelines for exercising safely.    Portion Distortion:  -Group instruction provided by  PowerPoint slides, verbal discussion, written materials, and food models to support subject matter. The instructor gives an explanation of serving size versus portion size, changes in portions sizes over the last 20 years, and what consists of a serving from each food group.   Stress Management:  -Group instruction provided by verbal instruction, video, and written materials to support subject matter.  Instructors review role of stress in heart disease and how to cope with stress positively.     Exercising on Your Own:  -Group instruction provided by verbal instruction, power point, and written materials to support subject.  Instructors discuss benefits of exercise, components of exercise, frequency and intensity of exercise, and end points for exercise.  Also discuss use of nitroglycerin and activating EMS.  Review options of places to exercise outside of rehab.  Review guidelines for sex  with heart disease.   Cardiac Drugs I:  -Group instruction provided by verbal instruction and written materials to support subject.  Instructor reviews cardiac drug classes: antiplatelets, anticoagulants, beta blockers, and statins.  Instructor discusses reasons, side effects, and lifestyle considerations for each drug class.   Cardiac Drugs II:  -Group instruction provided by verbal instruction and written materials to support subject.  Instructor reviews cardiac drug classes: angiotensin converting enzyme inhibitors (ACE-I), angiotensin II receptor blockers (ARBs), nitrates, and calcium channel blockers.  Instructor discusses reasons, side effects, and lifestyle considerations for each drug class.   Anatomy and Physiology of the Circulatory System:  Group verbal and written instruction and models provide basic cardiac anatomy and physiology, with the coronary electrical and arterial systems. Review of: AMI, Angina, Valve disease, Heart Failure, Peripheral Artery Disease, Cardiac Arrhythmia, Pacemakers, and  the ICD.   Other Education:  -Group or individual verbal, written, or video instructions that support the educational goals of the cardiac rehab program.   Knowledge Questionnaire Score: Knowledge Questionnaire Score - 09/22/17 1530      Knowledge Questionnaire Score   Pre Score  21/24       Core Components/Risk Factors/Patient Goals at Admission: Personal Goals and Risk Factors at Admission - 09/22/17 1531      Core Components/Risk Factors/Patient Goals on Admission    Weight Management  Yes;Obesity;Weight Loss    Intervention  Weight Management: Develop a combined nutrition and exercise program designed to reach desired caloric intake, while maintaining appropriate intake of nutrient and fiber, sodium and fats, and appropriate energy expenditure required for the weight goal.;Weight Management: Provide education and appropriate resources to help participant work on and attain dietary goals.;Weight Management/Obesity: Establish reasonable short term and long term weight goals.;Obesity: Provide education and appropriate resources to help participant work on and attain dietary goals.    Admit Weight  276 lb 14.4 oz (125.6 kg)    Goal Weight: Short Term  270 lb (122.5 kg)    Goal Weight: Long Term  266 lb (120.7 kg)    Expected Outcomes  Long Term: Adherence to nutrition and physical activity/exercise program aimed toward attainment of established weight goal;Weight Loss: Understanding of general recommendations for a balanced deficit meal plan, which promotes 1-2 lb weight loss per week and includes a negative energy balance of [phone] kcal/d;Understanding recommendations for meals to include 15-35% energy as protein, 25-35% energy from fat, 35-60% energy from carbohydrates, less than 200mg  of dietary cholesterol, 20-35 gm of total fiber daily;Understanding of distribution of calorie intake throughout the day with the consumption of 4-5 meals/snacks    Diabetes  Yes    Intervention  Provide  education about signs/symptoms and action to take for hypo/hyperglycemia.;Provide education about proper nutrition, including hydration, and aerobic/resistive exercise prescription along with prescribed medications to achieve blood glucose in normal ranges: Fasting glucose 65-99 mg/dL    Expected Outcomes  Short Term: Participant verbalizes understanding of the signs/symptoms and immediate care of hyper/hypoglycemia, proper foot care and importance of medication, aerobic/resistive exercise and nutrition plan for blood glucose control.;Long Term: Attainment of HbA1C < 7%.    Hypertension  Yes    Intervention  Provide education on lifestyle modifcations including regular physical activity/exercise, weight management, moderate sodium restriction and increased consumption of fresh fruit, vegetables, and low fat dairy, alcohol moderation, and smoking cessation.;Monitor prescription use compliance.    Expected Outcomes  Short Term: Continued assessment and intervention until BP is < 140/92mm HG in hypertensive participants. < 130/49mm HG in  hypertensive participants with diabetes, heart failure or chronic kidney disease.;Long Term: Maintenance of blood pressure at goal levels.    Lipids  Yes    Intervention  Provide education and support for participant on nutrition & aerobic/resistive exercise along with prescribed medications to achieve LDL 70mg , HDL >40mg .    Expected Outcomes  Short Term: Participant states understanding of desired cholesterol values and is compliant with medications prescribed. Participant is following exercise prescription and nutrition guidelines.;Long Term: Cholesterol controlled with medications as prescribed, with individualized exercise RX and with personalized nutrition plan. Value goals: LDL < 70mg , HDL > 40 mg.       Core Components/Risk Factors/Patient Goals Review:    Core Components/Risk Factors/Patient Goals at Discharge (Final Review):    ITP Comments: ITP Comments     Row Name 09/22/17 1404           ITP Comments  Dr. Fransico Him, Medical Director           Comments:  Patient attended orientation from 1345 to 1600 to review rules and guidelines for program. Completed 6 minute walk test, Intitial ITP, and exercise prescription.  VSS. Telemetry-SR with T wave inversion. Pt was asymptomatic with no complaints.  Brief psychosocial assessment reveal no immediate barriers to participating in cardiac rehab.  Pt with some work related anxiety and has requested vocational rehab.  Pt will be given papers to complete. Pt feels supported in her rehab endeavors and is excited to return next week. Cherre Huger, BSN Cardiac and Training and development officer

## 2017-09-23 NOTE — Progress Notes (Signed)
BREIONA COUVILLON 37 y.o. female DOB: 27-Jul-1981 MRN: [id number]      Nutrition Note  1. ST elevation myocardial infarction involving right coronary artery ([place])   2. Status post coronary artery stent placement   3. S/P CABG x 5    Past Medical History:  Diagnosis Date  . Diabetes mellitus    s/p lap band 09/02/2008  Meds reviewed. Levemir noted  HT: Ht Readings from Last 1 Encounters:  09/22/17 5' 3.25" (1.607 m)    WT: Wt Readings from Last 3 Encounters:  09/22/17 276 lb 14.4 oz (125.6 kg)  09/15/17 270 lb (122.5 kg)  09/14/17 270 lb 2.2 oz (122.5 kg)     BMI 47.8   Current tobacco use? No   Labs:  Lipid Panel     Component Value Date/Time   CHOL 101 09/14/2017 0949   TRIG 66 09/14/2017 0949   HDL 40 09/14/2017 0949   CHOLHDL 2.5 09/14/2017 0949   CHOLHDL 3.6 07/26/2017 0250   VLDL 25 07/26/2017 0250   LDLCALC 48 09/14/2017 0949    Lab Results  Component Value Date   HGBA1C 7.1 (H) 08/01/2017   CBG (last 3)  No results for input(s): GLUCAP in the last 72 hours.  Nutrition Note Spoke with pt. Nutrition plan and goals reviewed with pt. Pt is following Step 2 of the Therapeutic Lifestyle Changes diet. Pt wants to lose wt. Pt had lap band surgery in 2009. Per EMR, pt has gained 49 lb over the past 5 years. Wt loss tips reviewed. Per discussion, pt eating only veggies for snacks and needs some alternative ideas. Pt reports her MD instructed her to follow a 1500 kcal diet. Pt is diabetic. Last A1c indicates blood not optimally controlled. Pt expressed understanding of the information reviewed. Pt aware of nutrition education classes offered.  Nutrition Diagnosis ? Food-and nutrition-related knowledge deficit related to lack of exposure to information as related to diagnosis of: ? CVD ? DM ? Obesity related to excessive energy intake as evidenced by a BMI of 47.8  Nutrition Intervention ? Pt given handout for 1500 kcal, 5-day menu ideas ? Pt's individual nutrition  plan and goals reviewed with pt.  Nutrition Goal(s):  ? Pt to identify food quantities necessary to achieve weight loss of 6-24 lb (2.7-10.9 kg) at graduation from cardiac rehab. ? Improved blood glucose control as evidenced by pt's A1c trending from 7.1 toward less than 7.0.  Plan:  Pt to attend nutrition classes ? Nutrition I ? Nutrition II ? Portion Distortion ? Diabetes Blitz ? Diabetes Q & A Will provide client-centered nutrition education as part of interdisciplinary care.   Monitor and evaluate progress toward nutrition goal with team.  Derek Mound, M.Ed, RD, LDN, CDE 09/23/2017 10:45 AM

## 2017-09-26 ENCOUNTER — Encounter (HOSPITAL_COMMUNITY)
Admission: RE | Admit: 2017-09-26 | Discharge: 2017-09-26 | Disposition: A | Discharge status: Home / Self Care | Payer: BC Managed Care – PPO | Source: Ambulatory Visit | Attending: Interventional Cardiology | Admitting: Interventional Cardiology

## 2017-09-26 ENCOUNTER — Ambulatory Visit: Payer: BC Managed Care – PPO

## 2017-09-26 ENCOUNTER — Encounter (HOSPITAL_COMMUNITY): Payer: Self-pay

## 2017-09-26 DIAGNOSIS — Z48812 Encounter for surgical aftercare following surgery on the circulatory system: Secondary | ICD-10-CM | POA: Diagnosis not present

## 2017-09-26 DIAGNOSIS — Z951 Presence of aortocoronary bypass graft: Secondary | ICD-10-CM | POA: Diagnosis present

## 2017-09-26 DIAGNOSIS — Z955 Presence of coronary angioplasty implant and graft: Secondary | ICD-10-CM | POA: Diagnosis present

## 2017-09-26 DIAGNOSIS — I2111 ST elevation (STEMI) myocardial infarction involving right coronary artery: Secondary | ICD-10-CM

## 2017-09-26 DIAGNOSIS — I2129 ST elevation (STEMI) myocardial infarction involving other sites: Secondary | ICD-10-CM | POA: Diagnosis present

## 2017-09-26 LAB — GLUCOSE, CAPILLARY
Glucose-Capillary: 153 mg/dL — ABNORMAL HIGH (ref 65–99)
Glucose-Capillary: 156 mg/dL — ABNORMAL HIGH (ref 65–99)

## 2017-09-26 NOTE — Progress Notes (Addendum)
Daily Session Note  Patient Details  Name: [name] MRN: [id number] Date of Birth: 12/09/1980 Referring Provider:     CARDIAC REHAB PHASE II ORIENTATION from 09/22/2017 in [facility]  Referring Provider  Belva Crome MD       Encounter Date: 09/26/2017  Check In: Session Check In - 09/26/17 1354      Check-In   Location  [facility]    Staff Present  Dorma Russell, MS,ACSM CEP, Exercise Physiologist;Maria Whitaker, RN, BSN;Joann Rion, RN, BSN;Amber Fair, MS, ACSM RCEP, Exercise Physiologist    Supervising physician immediately available to respond to emergencies  [facility] immediately available    Physician(s)  Dr. Mont Dutton    Medication changes reported      No    Fall or balance concerns reported     No    Tobacco Cessation  No Change    Warm-up and Cool-down  Performed as group-led instruction    Resistance Training Performed  Yes    VAD Patient?  No      Pain Assessment   Currently in Pain?  No/denies       Capillary Blood Glucose: Results for orders placed or performed during the hospital encounter of 09/26/17 (from the past 24 hour(s))  Glucose, capillary     Status: Abnormal   Collection Time: 09/26/17  2:18 PM  Result Value Ref Range   Glucose-Capillary 156 (H) 65 - 99 mg/dL      Social History   Tobacco Use  Smoking Status Never Smoker  Smokeless Tobacco Never Used    Goals Met:  Exercise tolerated well  Goals Unmet:  Not Applicable  Comments: Pt started cardiac rehab today.  Pt tolerated light exercise without difficulty. VSS, telemetry-sinus rhythm, asymptomatic.  Medication list reconciled. Pt denies barriers to medicaiton compliance.  PSYCHOSOCIAL ASSESSMENT:  PHQ-0. Pt exhibits positive coping skills, hopeful outlook with supportive family. No psychosocial needs identified at this time, no psychosocial interventions necessary.    Pt enjoys spending time with family and friends, traveling  and shopping. Pt interested in vocational rehab services. Pt goals are to increase strength/stamina, weight loss and resume usual state of well being.    Pt oriented to exercise equipment and routine.    Understanding verbalized.   Dr. Fransico Him is Medical Director for Cardiac Rehab at [facility].

## 2017-09-28 ENCOUNTER — Telehealth (HOSPITAL_COMMUNITY): Payer: Self-pay | Admitting: *Deleted

## 2017-09-28 ENCOUNTER — Encounter (HOSPITAL_COMMUNITY)
Admission: RE | Admit: 2017-09-28 | Discharge: 2017-09-28 | Disposition: A | Discharge status: Home / Self Care | Payer: BC Managed Care – PPO | Source: Ambulatory Visit

## 2017-09-28 NOTE — Telephone Encounter (Signed)
Return call to pt to discuss CR participation. Pt unable to afford new insurance copay in 2019.  Pt interested in cardiac maintenance program.   Referral sent.  Pt instructed she will be notified by CR staff when MD order received.  Understanding verbalized.   Andi Hence, RN, BSN Cardiac Pulmonary Rehab 09/28/17  4:35 PM

## 2017-09-28 NOTE — Telephone Encounter (Signed)
Returned call from message left earlier today.  Message left regarding verification of insurance benefits for 2019 with [employer].  Contact information provided. Cherre Huger, BSN Cardiac and Training and development officer

## 2017-09-30 ENCOUNTER — Encounter (HOSPITAL_COMMUNITY): Payer: BC Managed Care – PPO

## 2017-10-01 ENCOUNTER — Other Ambulatory Visit: Payer: Self-pay | Admitting: Physician Assistant

## 2017-10-03 ENCOUNTER — Encounter (HOSPITAL_COMMUNITY): Payer: BC Managed Care – PPO

## 2017-10-04 ENCOUNTER — Telehealth: Payer: Self-pay

## 2017-10-04 NOTE — Telephone Encounter (Signed)
Patient called concerned about sternal pain/ pressure after beginning cardiac rehabilitation.  Patient stated that it happens when she moves and sneezes.  But this is not a consistent pain.  Expressed to her the use of splinting her chest when she coughs to help with the pressure when sneezing.  She was also advised to take over the counter Tylenol for pain more often than once daily as she was currently taken and see if there is any improvement with the pain.  She verbalized understanding and was told to call back if the pain got any worse or didn't subside and we would get her in to see Dr. Servando Snare.

## 2017-10-05 ENCOUNTER — Encounter (HOSPITAL_COMMUNITY): Payer: BC Managed Care – PPO

## 2017-10-07 ENCOUNTER — Encounter (HOSPITAL_COMMUNITY)
Admission: RE | Admit: 2017-10-07 | Discharge: 2017-10-07 | Disposition: A | Discharge status: Home / Self Care | Payer: Self-pay | Source: Ambulatory Visit | Attending: Interventional Cardiology | Admitting: Interventional Cardiology

## 2017-10-07 ENCOUNTER — Encounter (HOSPITAL_COMMUNITY): Payer: BC Managed Care – PPO

## 2017-10-07 DIAGNOSIS — Z48812 Encounter for surgical aftercare following surgery on the circulatory system: Secondary | ICD-10-CM | POA: Insufficient documentation

## 2017-10-07 DIAGNOSIS — Z951 Presence of aortocoronary bypass graft: Secondary | ICD-10-CM | POA: Insufficient documentation

## 2017-10-07 DIAGNOSIS — I2129 ST elevation (STEMI) myocardial infarction involving other sites: Secondary | ICD-10-CM | POA: Insufficient documentation

## 2017-10-08 ENCOUNTER — Other Ambulatory Visit: Payer: Self-pay | Admitting: Physician Assistant

## 2017-10-10 ENCOUNTER — Other Ambulatory Visit: Payer: Self-pay | Admitting: Interventional Cardiology

## 2017-10-10 ENCOUNTER — Encounter (HOSPITAL_COMMUNITY)
Admission: RE | Admit: 2017-10-10 | Discharge: 2017-10-10 | Disposition: A | Discharge status: Home / Self Care | Payer: Self-pay | Source: Ambulatory Visit | Attending: Interventional Cardiology | Admitting: Interventional Cardiology

## 2017-10-10 ENCOUNTER — Encounter (HOSPITAL_COMMUNITY): Payer: BC Managed Care – PPO

## 2017-10-10 MED ORDER — ATORVASTATIN CALCIUM 80 MG PO TABS: 80.0000 mg | ORAL_TABLET | Freq: Every day | ORAL | 1 refills | Status: DC

## 2017-10-10 MED ORDER — CLOPIDOGREL BISULFATE 75 MG PO TABS: 75.0000 mg | ORAL_TABLET | Freq: Every day | ORAL | 1 refills | Status: DC

## 2017-10-10 MED ORDER — METOPROLOL TARTRATE 25 MG PO TABS: 25.0000 mg | ORAL_TABLET | Freq: Two times a day (BID) | ORAL | 1 refills | Status: DC

## 2017-10-10 NOTE — Telephone Encounter (Signed)
Pt's medication was sent to pt's pharmacy as requested. Confirmation received.  °

## 2017-10-11 NOTE — Progress Notes (Signed)
Discharge Progress Report  Patient Details  Name: [name] MRN: [id number] Date of Birth: March 14, 1981 Referring Provider:     CARDIAC REHAB PHASE II ORIENTATION from 09/22/2017 in [facility]  Referring Provider  Belva Crome MD        Number of Visits: 2  Reason for Discharge:  Early Exit:  Insurance  Smoking History:  Social History   Tobacco Use  Smoking Status Never Smoker  Smokeless Tobacco Never Used    Diagnosis:  ST elevation myocardial infarction involving right coronary artery ([facility])  S/P CABG x 5  Status post coronary artery stent placement  ADL [facility]:   Initial Exercise Prescription: Initial Exercise Prescription - 09/22/17 1500      Date of Initial Exercise RX and Referring Provider   Date  09/22/17    Referring Provider  Belva Crome MD       Recumbant Bike   Level  2    RPM  70    Watts  30    Minutes  15    METs  2.76      NuStep   Level  2    SPM  70    Minutes  15    METs  2.8      Track   Laps  14    Minutes  15    METs  2.62      Prescription Details   Frequency (times per week)  3x    Duration  Progress to 30 minutes of continuous aerobic without signs/symptoms of physical distress      Intensity   THRR 40-80% of Max Heartrate  73-146    Ratings of Perceived Exertion  11-15    Perceived Dyspnea  0-4      Progression   Progression  Continue progressive overload as per policy without signs/symptoms or physical distress.      Resistance Training   Training Prescription  Yes    Weight  2lbs    Reps  10-15       Discharge Exercise Prescription (Final Exercise Prescription Changes): Exercise Prescription Changes - 09/26/17 1600      Response to Exercise   Blood Pressure (Admit)  118/70    Blood Pressure (Exercise)  134/82    Blood Pressure (Exit)  114/70    Heart Rate (Admit)  84 bpm    Heart Rate (Exercise)  124 bpm    Heart Rate (Exit)  72 bpm    Rating of Perceived Exertion  (Exercise)  12    Symptoms  none    Comments  pt was oriented to exercise equipment today    Duration  Continue with 30 min of aerobic exercise without signs/symptoms of physical distress.    Intensity  THRR unchanged      Progression   Progression  Continue to progress workloads to maintain intensity without signs/symptoms of physical distress.    Average METs  2.4      Resistance Training   Training Prescription  Yes    Weight  2lbs    Reps  10-15    Time  10 Minutes      NuStep   Level  2    SPM  70    Minutes  15    METs  1.7      Track   Laps  18    Minutes  15    METs  3.09  Functional Capacity: 6 Minute Walk    Row Name 09/22/17 1526         6 Minute Walk   Phase  Initial     Distance  1297 feet     Walk Time  6 minutes     # of Rest Breaks  0     MPH  2.46     METS  3.48     RPE  12     Perceived Dyspnea   1     Symptoms  Yes (comment)     Comments  Mild Shortness of Breath +1     Resting HR  66 bpm     Resting BP  118/70     Resting Oxygen Saturation   100 %     Exercise Oxygen Saturation  during 6 min walk  100 %     Max Ex. HR  95 bpm     Max Ex. BP  130/72     2 Minute Post BP  112/68        Psychological, QOL, Others - Outcomes: PHQ 2/9: Depression screen PHQ 2/9 09/26/2017  Decreased Interest 0  Down, Depressed, Hopeless 0  PHQ - 2 Score 0    Quality of Life: Quality of Life - 09/22/17 1529      Quality of Life Scores   Health/Function Pre  24 %    Socioeconomic Pre  24.79 %    Psych/Spiritual Pre  23.14 %    Family Pre  22.5 %    GLOBAL Pre  23.8 %       Personal Goals: Goals established at orientation with interventions provided to work toward goal. Personal Goals and Risk Factors at Admission - 09/22/17 1531      Core Components/Risk Factors/Patient Goals on Admission    Weight Management  Yes;Obesity;Weight Loss    Intervention  Weight Management: Develop a combined nutrition and exercise program designed to reach  desired caloric intake, while maintaining appropriate intake of nutrient and fiber, sodium and fats, and appropriate energy expenditure required for the weight goal.;Weight Management: Provide education and appropriate resources to help participant work on and attain dietary goals.;Weight Management/Obesity: Establish reasonable short term and long term weight goals.;Obesity: Provide education and appropriate resources to help participant work on and attain dietary goals.    Admit Weight  276 lb 14.4 oz (125.6 kg)    Goal Weight: Short Term  270 lb (122.5 kg)    Goal Weight: Long Term  266 lb (120.7 kg)    Expected Outcomes  Long Term: Adherence to nutrition and physical activity/exercise program aimed toward attainment of established weight goal;Weight Loss: Understanding of general recommendations for a balanced deficit meal plan, which promotes 1-2 lb weight loss per week and includes a negative energy balance of [phone] kcal/d;Understanding recommendations for meals to include 15-35% energy as protein, 25-35% energy from fat, 35-60% energy from carbohydrates, less than 200mg  of dietary cholesterol, 20-35 gm of total fiber daily;Understanding of distribution of calorie intake throughout the day with the consumption of 4-5 meals/snacks    Diabetes  Yes    Intervention  Provide education about signs/symptoms and action to take for hypo/hyperglycemia.;Provide education about proper nutrition, including hydration, and aerobic/resistive exercise prescription along with prescribed medications to achieve blood glucose in normal ranges: Fasting glucose 65-99 mg/dL    Expected Outcomes  Short Term: Participant verbalizes understanding of the signs/symptoms and immediate care of hyper/hypoglycemia, proper foot care and importance of medication, aerobic/resistive  exercise and nutrition plan for blood glucose control.;Long Term: Attainment of HbA1C < 7%.    Hypertension  Yes    Intervention  Provide education on  lifestyle modifcations including regular physical activity/exercise, weight management, moderate sodium restriction and increased consumption of fresh fruit, vegetables, and low fat dairy, alcohol moderation, and smoking cessation.;Monitor prescription use compliance.    Expected Outcomes  Short Term: Continued assessment and intervention until BP is < 140/67mm HG in hypertensive participants. < 130/32mm HG in hypertensive participants with diabetes, heart failure or chronic kidney disease.;Long Term: Maintenance of blood pressure at goal levels.    Lipids  Yes    Intervention  Provide education and support for participant on nutrition & aerobic/resistive exercise along with prescribed medications to achieve LDL 70mg , HDL >40mg .    Expected Outcomes  Short Term: Participant states understanding of desired cholesterol values and is compliant with medications prescribed. Participant is following exercise prescription and nutrition guidelines.;Long Term: Cholesterol controlled with medications as prescribed, with individualized exercise RX and with personalized nutrition plan. Value goals: LDL < 70mg , HDL > 40 mg.        Personal Goals Discharge:   Exercise Goals and Review: Exercise Goals    Row Name 09/22/17 1530             Exercise Goals   Increase Physical Activity  Yes       Intervention  Provide advice, education, support and counseling about physical activity/exercise needs.;Develop an individualized exercise prescription for aerobic and resistive training based on initial evaluation findings, risk stratification, comorbidities and participant's personal goals.       Expected Outcomes  Achievement of increased cardiorespiratory fitness and enhanced flexibility, muscular endurance and strength shown through measurements of functional capacity and personal statement of participant.       Increase Strength and Stamina  Yes       Intervention  Provide advice, education, support and  counseling about physical activity/exercise needs.;Develop an individualized exercise prescription for aerobic and resistive training based on initial evaluation findings, risk stratification, comorbidities and participant's personal goals.       Expected Outcomes  Achievement of increased cardiorespiratory fitness and enhanced flexibility, muscular endurance and strength shown through measurements of functional capacity and personal statement of participant.       Able to understand and use rate of perceived exertion (RPE) scale  Yes       Intervention  Provide education and explanation on how to use RPE scale       Expected Outcomes  Short Term: Able to use RPE daily in rehab to express subjective intensity level;Long Term:  Able to use RPE to guide intensity level when exercising independently       Knowledge and understanding of Target Heart Rate Range (THRR)  Yes       Intervention  Provide education and explanation of THRR including how the numbers were predicted and where they are located for reference       Expected Outcomes  Short Term: Able to state/look up THRR;Short Term: Able to use daily as guideline for intensity in rehab;Long Term: Able to use THRR to govern intensity when exercising independently       Able to check pulse independently  Yes       Intervention  Provide education and demonstration on how to check pulse in carotid and radial arteries.;Review the importance of being able to check your own pulse for safety during independent exercise  Expected Outcomes  Short Term: Able to explain why pulse checking is important during independent exercise;Long Term: Able to check pulse independently and accurately       Understanding of Exercise Prescription  Yes       Intervention  Provide education, explanation, and written materials on patient's individual exercise prescription       Expected Outcomes  Short Term: Able to explain program exercise prescription;Long Term: Able to  explain home exercise prescription to exercise independently          Nutrition & Weight - Outcomes: Pre Biometrics - 09/22/17 1527      Pre Biometrics   Height  5' 3.25" (1.607 m)    Weight  276 lb 14.4 oz (125.6 kg)    Waist Circumference  48.5 inches    Hip Circumference  50.5 inches    Waist to Hip Ratio  0.96 %    BMI (Calculated)  48.64    Triceps Skinfold  40 mm    % Body Fat  55.2 %    Grip Strength  32 kg    Flexibility  14 in    Single Leg Stand  30 seconds        Nutrition: Nutrition Therapy & Goals - 09/23/17 1123      Nutrition Therapy   Diet  Carb Modified, Heart Healthy      Personal Nutrition Goals   Nutrition Goal  Pt to identify food quantities necessary to achieve weight loss of 6-24 lb (2.7-10.9 kg) at graduation from cardiac rehab.    Personal Goal #2  Improved blood glucose control as evidenced by pt's A1c trending from 7.1 toward less than 7.0.      Intervention Plan   Intervention  Prescribe, educate and counsel regarding individualized specific dietary modifications aiming towards targeted core components such as weight, hypertension, lipid management, diabetes, heart failure and other comorbidities.    Expected Outcomes  Short Term Goal: Understand basic principles of dietary content, such as calories, fat, sodium, cholesterol and nutrients.;Long Term Goal: Adherence to prescribed nutrition plan.       Nutrition Discharge: Nutrition Assessments - 09/23/17 1125      MEDFICTS Scores   Pre Score  26       Education Questionnaire Score: Knowledge Questionnaire Score - 09/22/17 1530      Knowledge Questionnaire Score   Pre Score  21/24       Goals reviewed with patient; copy given to patient.

## 2017-10-12 ENCOUNTER — Encounter (HOSPITAL_COMMUNITY): Payer: BC Managed Care – PPO

## 2017-10-12 ENCOUNTER — Encounter (HOSPITAL_COMMUNITY)
Admission: RE | Admit: 2017-10-12 | Discharge: 2017-10-12 | Disposition: A | Discharge status: Home / Self Care | Payer: Self-pay | Source: Ambulatory Visit | Attending: Interventional Cardiology | Admitting: Interventional Cardiology

## 2017-10-13 ENCOUNTER — Telehealth: Payer: Self-pay | Admitting: Interventional Cardiology

## 2017-10-13 NOTE — Telephone Encounter (Signed)
Spoke with pt and she states that she started having chest discomfort about 2.5 weeks ago.  It was occurring under her right breast.  She spoke with someone at [facility] and advised her it may be she is still healing and advised her to take Tylenol for the pain.  Pt states Tylenol helps but does not resolve the pain.  Pt currently in cardiac rehab maintenance program and states she has noticed worsening of the pain after exercising at rehab.  Pain also occurs when she moves certain ways and she says it is worse when she wears a bra.  Pt would like a sooner appt so she can see about getting cleared to go back to work.  Scheduled pt to see Richardson Dopp, PA-C tomorrow for eval of chest discomfort and possible clearance to return to work.  Pt verbalized understanding and was appreciative for assistance.

## 2017-10-13 NOTE — Telephone Encounter (Signed)
New Message   Patient is needing clearance to go back to work with no restrictions other than working no more than 8 hours a day. She also needs the note to indicate her restrictions in terms of how much she can lift.  She also states that she is still experiencing some discomfort as well as fatigue that she would like to discuss further. Please call.

## 2017-10-14 ENCOUNTER — Ambulatory Visit: Payer: BC Managed Care – PPO | Admitting: Physician Assistant

## 2017-10-14 ENCOUNTER — Encounter (HOSPITAL_COMMUNITY): Payer: BC Managed Care – PPO

## 2017-10-14 ENCOUNTER — Encounter (HOSPITAL_COMMUNITY): Payer: Self-pay

## 2017-10-14 ENCOUNTER — Encounter: Payer: Self-pay | Admitting: Physician Assistant

## 2017-10-14 VITALS — BP 146/82 | HR 60 | Ht 63.0 in | Wt 273.0 lb

## 2017-10-14 DIAGNOSIS — I2510 Atherosclerotic heart disease of native coronary artery without angina pectoris: Secondary | ICD-10-CM | POA: Diagnosis not present

## 2017-10-14 DIAGNOSIS — E7849 Other hyperlipidemia: Secondary | ICD-10-CM | POA: Diagnosis not present

## 2017-10-14 DIAGNOSIS — E669 Obesity, unspecified: Secondary | ICD-10-CM

## 2017-10-14 DIAGNOSIS — E1169 Type 2 diabetes mellitus with other specified complication: Secondary | ICD-10-CM

## 2017-10-14 NOTE — Patient Instructions (Addendum)
Medication Instructions:  1. Your physician recommends that you continue on your current medications as directed. Please refer to the Current Medication list given to you today.   Labwork: NONE ORDERED TODAY  Testing/Procedures: NONE ORDERED TODAY  Follow-Up: DR. Tamala Julian ON 01/18/18 @ 4:20  Any Other Special Instructions Will Be Listed Below (If Applicable).     If you need a refill on your cardiac medications before your next appointment, please call your pharmacy.

## 2017-10-14 NOTE — Progress Notes (Signed)
Cardiology Office Note:    Date:  10/14/2017   ID:  [name], DOB 1981/09/17, MRN [id number]  PCP:  Donald Prose, MD  Cardiologist:  Sinclair Grooms, MD   Referring MD: Donald Prose, MD   Chief Complaint  Patient presents with  . Chest Pain    History of Present Illness:    [name] is a 37 y.o. female with a hx of diabetes, obesity status post lap band procedure in 2009, coronary artery disease status post inferior STEMI followed by CABG in November 2018.  She was last seen in clinic by Truitt Merle, NP 09/14/17.     [name] returns for evaluation of chest discomfort.  She has had some chest wall pain in the last few weeks.  This is nothing like her previous angina.  It seems to be related to activity she has done in cardiac rehab.  She typically experiences it at rest.  She denies any drainage from her sternal wound.  She denies significant dyspnea, PND, edema.  She denies syncope.  Prior CV studies:   The following studies were reviewed today:  Carotid US 11/18 No significant ICA stenosis  Echocardiogram 07/25/17 Mild LVH, EF 55-60, normal wall motion, trivial MR  Cardiac catheterization 07/24/17  Acute inferior ST elevation myocardial infarction  Total occlusion of the mid right coronary.  100% stenosis reduced to 0% at the angioplasty site following stenting with a 3.0 x 20 Promus Premier postdilated to 3.25 mm in diameter reestablishing TIMI grade III flow.  Severe distal RCA/PDA/RCA continuation [place] 111 bifurcation stenosis was revealed after recanalization of the mid occlusion.  Severe proximal LAD, 95%, 80-90% mid LAD, and 70% first diagonal stenosis.  90% proximal to mid circumflex stenosis.  Inferior wall hypokinesis.  EF 55%.  Acute diastolic heart failure with EDP 20 mmHg.  Past Medical History:  Diagnosis Date  . Diabetes mellitus     Past Surgical History:  Procedure Laterality Date  . CORONARY ARTERY BYPASS GRAFT N/A 08/02/2017     Procedure: CORONARY ARTERY BYPASS GRAFTING (CABG) X 5 (LIMA to LAD, LEFT RADIAL ARTERY to DIAGONAL, SVG to SEQUENTIALLY PDA and PLB) , USING LEFT INTERNAL MAMMARY ARTERY, LEFT RADIAL ARTERY, AND  SAPHENOUS VEIN to [place], RIGHT GREATER SAPHENOUS VEIN HARVESTED ENDOSCOPICALLY;  Surgeon: Grace Isaac, MD;  Location: [facility];  Service: Open Heart Surgery;  Laterality: N/A;  . CORONARY/GRAFT ACUTE MI REVASCULARIZATION N/A 07/24/2017   Procedure: Coronary/Graft Acute MI Revascularization;  Surgeon: Belva Crome, MD;  Location: [facility];  Service: Cardiovascular;  Laterality: N/A;  . LAPAROSCOPIC GASTRIC BANDING  09/02/2008  . LEFT HEART CATH AND CORONARY ANGIOGRAPHY N/A 07/24/2017   Procedure: LEFT HEART CATH AND CORONARY ANGIOGRAPHY;  Surgeon: Belva Crome, MD;  Location: [facility];  Service: Cardiovascular;  Laterality: N/A;  . RADIAL ARTERY HARVEST Left 08/02/2017   Procedure: LEFT RADIAL ARTERY HARVEST;  Surgeon: Grace Isaac, MD;  Location: [facility];  Service: Open Heart Surgery;  Laterality: Left;  . STERNAL CLOSURE N/A 08/02/2017   Procedure: STERNAL PLATING;  Surgeon: Grace Isaac, MD;  Location: [facility];  Service: Open Heart Surgery;  Laterality: N/A;  . [place]   had metal removed from stomach as a child  . TEE WITHOUT CARDIOVERSION N/A 08/02/2017   Procedure: TRANSESOPHAGEAL ECHOCARDIOGRAM (TEE);  Surgeon: Grace Isaac, MD;  Location: [facility];  Service: Open Heart Surgery;  Laterality: N/A;  Current Medications: Current Meds  Medication Sig  . acetaminophen (TYLENOL) 500 MG tablet Take 500 mg by mouth every 6 (six) hours as needed for mild pain or headache.  . APPLE CIDER VINEGAR PO Take 2-4 mg daily by mouth.  Marland Kitchen aspirin EC 81 MG EC tablet Take 1 tablet (81 mg total) daily by mouth.  Marland Kitchen atorvastatin (LIPITOR) 80 MG tablet Take 1 tablet (80 mg total) by mouth daily at 6 PM.  . clopidogrel (PLAVIX) 75 MG tablet Take 1 tablet (75 mg  total) by mouth daily.  . colchicine 0.6 MG tablet Take 0.6 mg by mouth as needed.  . insulin detemir (LEVEMIR) 100 UNIT/ML injection Inject 0.26 mLs (26 Units total) daily into the skin.  . metoprolol tartrate (LOPRESSOR) 25 MG tablet Take 1 tablet (25 mg total) by mouth 2 (two) times daily.  . Misc Natural Products (DAILY HERBS BONE/JOINTS PO) Take 3-5 tablets daily by mouth. Insta flex     Allergies:   Patient has no known allergies.   Social History   Tobacco Use  . Smoking status: Never Smoker  . Smokeless tobacco: Never Used  Substance Use Topics  . Alcohol use: Yes    Comment: occ  . Drug use: No     Family Hx: The patient's family history includes Cancer in her maternal grandfather and maternal grandmother; Diabetes in her father; Heart disease in her father; Hypertension in her father.  ROS:   Please see the history of present illness.    ROS All other systems reviewed and are negative.   EKGs/Labs/Other Test Reviewed:    EKG:  EKG is  ordered today.  The ekg ordered today demonstrates normal sinus rhythm, heart rate 60, normal axis, T wave inversion in 3, aVF, V3-V5, QTC 400, no change from prior tracing dated 08/22/17  Recent Labs: 08/03/2017: Magnesium 2.4 09/14/2017: ALT 18; BUN 9; Creatinine, Ser 0.75; Hemoglobin 13.3; Platelets 320; Potassium 4.1; Sodium 141   Recent Lipid Panel Lab Results  Component Value Date/Time   CHOL 101 09/14/2017 09:49 AM   TRIG 66 09/14/2017 09:49 AM   HDL 40 09/14/2017 09:49 AM   CHOLHDL 2.5 09/14/2017 09:49 AM   CHOLHDL 3.6 07/26/2017 02:50 AM   LDLCALC 48 09/14/2017 09:49 AM    Physical Exam:    VS:  BP (!) 146/82   Pulse 60   Ht 5\' 3"  (1.6 m)   Wt 273 lb (123.8 kg)   SpO2 99%   BMI 48.36 kg/m     Wt Readings from Last 3 Encounters:  10/14/17 273 lb (123.8 kg)  09/22/17 276 lb 14.4 oz (125.6 kg)  09/15/17 270 lb (122.5 kg)     Physical Exam  Constitutional: She is oriented to person, place, and time. She  appears well-developed and well-nourished. No distress.  HENT:  Head: Normocephalic and atraumatic.  Eyes: No scleral icterus.  Neck: No JVD present. No thyromegaly present.  Cardiovascular: Normal rate and regular rhythm.  No murmur heard. Pulmonary/Chest: Effort normal. She has no rales.  Abdominal: Soft.  Musculoskeletal: She exhibits no edema.  Neurological: She is alert and oriented to person, place, and time.  Skin: Skin is warm and dry.    ASSESSMENT:    1. Coronary artery disease involving native coronary artery of native heart without angina pectoris   2. Diabetes mellitus type 2 in obese ([place])   3. Other hyperlipidemia    PLAN:    In order of problems listed above:  1. Coronary  artery disease involving native coronary artery of native heart without angina pectoris Status post inferior STEMI November 2018 followed by CABG.  She has had some musculoskeletal chest pain recently.  She has not had any angina.  Continue ASA, Plavix, statin, beta-blocker. Continue cardiac rehabilitation.  She would like to return to work October 31, 2017.  We contacted Dr. Everrett Coombe office today.  She will obtain a return to work note from his office.  I did provide her with a note today stating she is medically stable as her employer was asking for something from her Cardiologist.  She understands that any official release to work and restrictions would come from Dr. Servando Snare.   2.  Diabetes mellitus type 2 in obese [facility]) Continue management with primary care.  3.  Other hyperlipidemia LDL optimal on most recent lab work.  Continue current Rx.     Dispo:  Return in about 3 months (around 01/12/2018) for Routine Follow Up with Dr. Tamala Julian.   Medication Adjustments/Labs and Tests Ordered: Current medicines are reviewed at length with the patient today.  Concerns regarding medicines are outlined above.  Tests Ordered: Orders Placed This Encounter  Procedures  . EKG 12-Lead   Medication  Changes: No orders of the defined types were placed in this encounter.   Signed, Richardson Dopp, PA-C  10/14/2017 10:41 AM    [facility] [address], [place], [place]  [address] Phone: [phone]; Fax: [phone]

## 2017-10-17 ENCOUNTER — Encounter (HOSPITAL_COMMUNITY): Payer: BC Managed Care – PPO

## 2017-10-17 ENCOUNTER — Encounter (HOSPITAL_COMMUNITY)
Admission: RE | Admit: 2017-10-17 | Discharge: 2017-10-17 | Disposition: A | Discharge status: Home / Self Care | Payer: Self-pay | Source: Ambulatory Visit | Attending: Interventional Cardiology | Admitting: Interventional Cardiology

## 2017-10-19 ENCOUNTER — Encounter (HOSPITAL_COMMUNITY)
Admission: RE | Admit: 2017-10-19 | Discharge: 2017-10-19 | Disposition: A | Discharge status: Home / Self Care | Payer: Self-pay | Source: Ambulatory Visit | Attending: Interventional Cardiology | Admitting: Interventional Cardiology

## 2017-10-19 ENCOUNTER — Encounter (HOSPITAL_COMMUNITY): Payer: BC Managed Care – PPO

## 2017-10-21 ENCOUNTER — Encounter (HOSPITAL_COMMUNITY): Payer: BC Managed Care – PPO

## 2017-10-21 ENCOUNTER — Encounter (HOSPITAL_COMMUNITY): Payer: Self-pay

## 2017-10-24 ENCOUNTER — Encounter (HOSPITAL_COMMUNITY): Payer: Self-pay

## 2017-10-24 ENCOUNTER — Encounter (HOSPITAL_COMMUNITY): Payer: BC Managed Care – PPO

## 2017-10-26 ENCOUNTER — Encounter (HOSPITAL_COMMUNITY): Payer: Self-pay

## 2017-10-26 ENCOUNTER — Encounter (HOSPITAL_COMMUNITY): Payer: BC Managed Care – PPO

## 2017-10-28 ENCOUNTER — Encounter (HOSPITAL_COMMUNITY): Payer: BC Managed Care – PPO

## 2017-10-28 ENCOUNTER — Encounter (HOSPITAL_COMMUNITY): Payer: Self-pay

## 2017-10-31 ENCOUNTER — Encounter (HOSPITAL_COMMUNITY): Payer: Self-pay

## 2017-10-31 ENCOUNTER — Encounter (HOSPITAL_COMMUNITY): Payer: BC Managed Care – PPO

## 2017-11-02 ENCOUNTER — Encounter (HOSPITAL_COMMUNITY): Payer: BC Managed Care – PPO

## 2017-11-02 ENCOUNTER — Encounter (HOSPITAL_COMMUNITY): Payer: Self-pay

## 2017-11-04 ENCOUNTER — Encounter (HOSPITAL_COMMUNITY): Payer: BC Managed Care – PPO

## 2017-11-04 ENCOUNTER — Encounter (HOSPITAL_COMMUNITY): Payer: Self-pay

## 2017-11-07 ENCOUNTER — Encounter (HOSPITAL_COMMUNITY): Payer: Self-pay

## 2017-11-07 ENCOUNTER — Encounter (HOSPITAL_COMMUNITY): Payer: BC Managed Care – PPO

## 2017-11-09 ENCOUNTER — Encounter (HOSPITAL_COMMUNITY): Payer: Self-pay

## 2017-11-09 ENCOUNTER — Ambulatory Visit: Payer: BC Managed Care – PPO | Admitting: Interventional Cardiology

## 2017-11-09 ENCOUNTER — Encounter (HOSPITAL_COMMUNITY): Payer: BC Managed Care – PPO

## 2017-11-11 ENCOUNTER — Encounter (HOSPITAL_COMMUNITY): Payer: BC Managed Care – PPO

## 2017-11-11 ENCOUNTER — Encounter (HOSPITAL_COMMUNITY): Payer: Self-pay

## 2017-11-11 NOTE — Progress Notes (Signed)
Discharge Progress Report  Patient Details  Name: [name] MRN: [id number] Date of Birth: April 12, 1981 Referring Provider:     CARDIAC REHAB PHASE II ORIENTATION from 09/22/2017 in [place]  Referring Provider  Belva Crome MD        Number of Visits: 2   Reason for Discharge:  Early Exit:  Insurance; pt participating in cardiac maintenance program.    Smoking History:  Social History   Tobacco Use  Smoking Status Never Smoker  Smokeless Tobacco Never Used    Diagnosis:  No diagnosis found.  ADL UCSD:   Initial Exercise Prescription: Initial Exercise Prescription - 09/22/17 1500      Date of Initial Exercise RX and Referring Provider   Date  09/22/17    Referring Provider  Belva Crome MD       Recumbant Bike   Level  2    RPM  70    Watts  30    Minutes  15    METs  2.76      NuStep   Level  2    SPM  70    Minutes  15    METs  2.8      Track   Laps  14    Minutes  15    METs  2.62      Prescription Details   Frequency (times per week)  3x    Duration  Progress to 30 minutes of continuous aerobic without signs/symptoms of physical distress      Intensity   THRR 40-80% of Max Heartrate  73-146    Ratings of Perceived Exertion  11-15    Perceived Dyspnea  0-4      Progression   Progression  Continue progressive overload as per policy without signs/symptoms or physical distress.      Resistance Training   Training Prescription  Yes    Weight  2lbs    Reps  10-15       Discharge Exercise Prescription (Final Exercise Prescription Changes): Exercise Prescription Changes - 09/26/17 1600      Response to Exercise   Blood Pressure (Admit)  118/70    Blood Pressure (Exercise)  134/82    Blood Pressure (Exit)  114/70    Heart Rate (Admit)  84 bpm    Heart Rate (Exercise)  124 bpm    Heart Rate (Exit)  72 bpm    Rating of Perceived Exertion (Exercise)  12    Symptoms  none    Comments  pt was  oriented to exercise equipment today    Duration  Continue with 30 min of aerobic exercise without signs/symptoms of physical distress.    Intensity  THRR unchanged      Progression   Progression  Continue to progress workloads to maintain intensity without signs/symptoms of physical distress.    Average METs  2.4      Resistance Training   Training Prescription  Yes    Weight  2lbs    Reps  10-15    Time  10 Minutes      NuStep   Level  2    SPM  70    Minutes  15    METs  1.7      Track   Laps  18    Minutes  15    METs  3.09       Functional Capacity: 6 Minute Walk    Row  Name 09/22/17 1526         6 Minute Walk   Phase  Initial     Distance  1297 feet     Walk Time  6 minutes     # of Rest Breaks  0     MPH  2.46     METS  3.48     RPE  12     Perceived Dyspnea   1     Symptoms  Yes (comment)     Comments  Mild Shortness of Breath +1     Resting HR  66 bpm     Resting BP  118/70     Resting Oxygen Saturation   100 %     Exercise Oxygen Saturation  during 6 min walk  100 %     Max Ex. HR  95 bpm     Max Ex. BP  130/72     2 Minute Post BP  112/68        Psychological, QOL, Others - Outcomes: PHQ 2/9: Depression screen PHQ 2/9 09/26/2017  Decreased Interest 0  Down, Depressed, Hopeless 0  PHQ - 2 Score 0    Quality of Life: Quality of Life - 09/22/17 1529      Quality of Life Scores   Health/Function Pre  24 %    Socioeconomic Pre  24.79 %    Psych/Spiritual Pre  23.14 %    Family Pre  22.5 %    GLOBAL Pre  23.8 %       Personal Goals: Goals established at orientation with interventions provided to work toward goal. Personal Goals and Risk Factors at Admission - 09/22/17 1531      Core Components/Risk Factors/Patient Goals on Admission    Weight Management  Yes;Obesity;Weight Loss    Intervention  Weight Management: Develop a combined nutrition and exercise program designed to reach desired caloric intake, while maintaining appropriate  intake of nutrient and fiber, sodium and fats, and appropriate energy expenditure required for the weight goal.;Weight Management: Provide education and appropriate resources to help participant work on and attain dietary goals.;Weight Management/Obesity: Establish reasonable short term and long term weight goals.;Obesity: Provide education and appropriate resources to help participant work on and attain dietary goals.    Admit Weight  276 lb 14.4 oz (125.6 kg)    Goal Weight: Short Term  270 lb (122.5 kg)    Goal Weight: Long Term  266 lb (120.7 kg)    Expected Outcomes  Long Term: Adherence to nutrition and physical activity/exercise program aimed toward attainment of established weight goal;Weight Loss: Understanding of general recommendations for a balanced deficit meal plan, which promotes 1-2 lb weight loss per week and includes a negative energy balance of [phone] kcal/d;Understanding recommendations for meals to include 15-35% energy as protein, 25-35% energy from fat, 35-60% energy from carbohydrates, less than 200mg  of dietary cholesterol, 20-35 gm of total fiber daily;Understanding of distribution of calorie intake throughout the day with the consumption of 4-5 meals/snacks    Diabetes  Yes    Intervention  Provide education about signs/symptoms and action to take for hypo/hyperglycemia.;Provide education about proper nutrition, including hydration, and aerobic/resistive exercise prescription along with prescribed medications to achieve blood glucose in normal ranges: Fasting glucose 65-99 mg/dL    Expected Outcomes  Short Term: Participant verbalizes understanding of the signs/symptoms and immediate care of hyper/hypoglycemia, proper foot care and importance of medication, aerobic/resistive exercise and nutrition plan for blood glucose control.;Long Term:  Attainment of HbA1C < 7%.    Hypertension  Yes    Intervention  Provide education on lifestyle modifcations including regular physical  activity/exercise, weight management, moderate sodium restriction and increased consumption of fresh fruit, vegetables, and low fat dairy, alcohol moderation, and smoking cessation.;Monitor prescription use compliance.    Expected Outcomes  Short Term: Continued assessment and intervention until BP is < 140/55mm HG in hypertensive participants. < 130/56mm HG in hypertensive participants with diabetes, heart failure or chronic kidney disease.;Long Term: Maintenance of blood pressure at goal levels.    Lipids  Yes    Intervention  Provide education and support for participant on nutrition & aerobic/resistive exercise along with prescribed medications to achieve LDL 70mg , HDL >40mg .    Expected Outcomes  Short Term: Participant states understanding of desired cholesterol values and is compliant with medications prescribed. Participant is following exercise prescription and nutrition guidelines.;Long Term: Cholesterol controlled with medications as prescribed, with individualized exercise RX and with personalized nutrition plan. Value goals: LDL < 70mg , HDL > 40 mg.        Personal Goals Discharge:   Exercise Goals and Review: Exercise Goals    Row Name 09/22/17 1530             Exercise Goals   Increase Physical Activity  Yes       Intervention  Provide advice, education, support and counseling about physical activity/exercise needs.;Develop an individualized exercise prescription for aerobic and resistive training based on initial evaluation findings, risk stratification, comorbidities and participant's personal goals.       Expected Outcomes  Achievement of increased cardiorespiratory fitness and enhanced flexibility, muscular endurance and strength shown through measurements of functional capacity and personal statement of participant.       Increase Strength and Stamina  Yes       Intervention  Provide advice, education, support and counseling about physical activity/exercise needs.;Develop  an individualized exercise prescription for aerobic and resistive training based on initial evaluation findings, risk stratification, comorbidities and participant's personal goals.       Expected Outcomes  Achievement of increased cardiorespiratory fitness and enhanced flexibility, muscular endurance and strength shown through measurements of functional capacity and personal statement of participant.       Able to understand and use rate of perceived exertion (RPE) scale  Yes       Intervention  Provide education and explanation on how to use RPE scale       Expected Outcomes  Short Term: Able to use RPE daily in rehab to express subjective intensity level;Long Term:  Able to use RPE to guide intensity level when exercising independently       Knowledge and understanding of Target Heart Rate Range (THRR)  Yes       Intervention  Provide education and explanation of THRR including how the numbers were predicted and where they are located for reference       Expected Outcomes  Short Term: Able to state/look up THRR;Short Term: Able to use daily as guideline for intensity in rehab;Long Term: Able to use THRR to govern intensity when exercising independently       Able to check pulse independently  Yes       Intervention  Provide education and demonstration on how to check pulse in carotid and radial arteries.;Review the importance of being able to check your own pulse for safety during independent exercise       Expected Outcomes  Short Term: Able to explain why  pulse checking is important during independent exercise;Long Term: Able to check pulse independently and accurately       Understanding of Exercise Prescription  Yes       Intervention  Provide education, explanation, and written materials on patient's individual exercise prescription       Expected Outcomes  Short Term: Able to explain program exercise prescription;Long Term: Able to explain home exercise prescription to exercise independently           Nutrition & Weight - Outcomes: Pre Biometrics - 09/22/17 1527      Pre Biometrics   Height  5' 3.25" (1.607 m)    Weight  276 lb 14.4 oz (125.6 kg)    Waist Circumference  48.5 inches    Hip Circumference  50.5 inches    Waist to Hip Ratio  0.96 %    BMI (Calculated)  48.64    Triceps Skinfold  40 mm    % Body Fat  55.2 %    Grip Strength  32 kg    Flexibility  14 in    Single Leg Stand  30 seconds        Nutrition: Nutrition Therapy & Goals - 09/23/17 1123      Nutrition Therapy   Diet  Carb Modified, Heart Healthy      Personal Nutrition Goals   Nutrition Goal  Pt to identify food quantities necessary to achieve weight loss of 6-24 lb (2.7-10.9 kg) at graduation from cardiac rehab.    Personal Goal #2  Improved blood glucose control as evidenced by pt's A1c trending from 7.1 toward less than 7.0.      Intervention Plan   Intervention  Prescribe, educate and counsel regarding individualized specific dietary modifications aiming towards targeted core components such as weight, hypertension, lipid management, diabetes, heart failure and other comorbidities.    Expected Outcomes  Short Term Goal: Understand basic principles of dietary content, such as calories, fat, sodium, cholesterol and nutrients.;Long Term Goal: Adherence to prescribed nutrition plan.       Nutrition Discharge: Nutrition Assessments - 09/23/17 1125      MEDFICTS Scores   Pre Score  26       Education Questionnaire Score: Knowledge Questionnaire Score - 09/22/17 1530      Knowledge Questionnaire Score   Pre Score  21/24       Goals reviewed with patient; copy given to patient.

## 2017-11-14 ENCOUNTER — Encounter (HOSPITAL_COMMUNITY): Payer: BC Managed Care – PPO

## 2017-11-14 ENCOUNTER — Encounter (HOSPITAL_COMMUNITY): Payer: Self-pay

## 2017-11-16 ENCOUNTER — Encounter (HOSPITAL_COMMUNITY): Payer: Self-pay

## 2017-11-16 ENCOUNTER — Encounter (HOSPITAL_COMMUNITY): Payer: BC Managed Care – PPO

## 2017-11-18 ENCOUNTER — Encounter (HOSPITAL_COMMUNITY): Payer: BC Managed Care – PPO

## 2017-11-21 ENCOUNTER — Encounter (HOSPITAL_COMMUNITY): Payer: BC Managed Care – PPO

## 2017-11-23 ENCOUNTER — Encounter (HOSPITAL_COMMUNITY): Payer: BC Managed Care – PPO

## 2017-11-25 ENCOUNTER — Encounter (HOSPITAL_COMMUNITY): Payer: BC Managed Care – PPO

## 2017-11-28 ENCOUNTER — Encounter (HOSPITAL_COMMUNITY): Payer: BC Managed Care – PPO

## 2017-11-30 ENCOUNTER — Encounter (HOSPITAL_COMMUNITY): Payer: BC Managed Care – PPO

## 2017-12-02 ENCOUNTER — Encounter (HOSPITAL_COMMUNITY): Payer: BC Managed Care – PPO

## 2017-12-05 ENCOUNTER — Encounter (HOSPITAL_COMMUNITY): Payer: BC Managed Care – PPO

## 2017-12-05 ENCOUNTER — Other Ambulatory Visit: Payer: Self-pay | Admitting: Interventional Cardiology

## 2017-12-05 MED ORDER — CLOPIDOGREL BISULFATE 75 MG PO TABS: 75.0000 mg | ORAL_TABLET | Freq: Every day | ORAL | 2 refills | Status: DC

## 2017-12-05 MED ORDER — METOPROLOL TARTRATE 25 MG PO TABS: 25.0000 mg | ORAL_TABLET | Freq: Two times a day (BID) | ORAL | 2 refills | Status: DC

## 2017-12-05 MED ORDER — ATORVASTATIN CALCIUM 80 MG PO TABS: 80.0000 mg | ORAL_TABLET | Freq: Every day | ORAL | 2 refills | Status: DC

## 2017-12-05 NOTE — Telephone Encounter (Signed)
Pt's medication was sent to pt's pharmacy as requested. Confirmation received.  °

## 2017-12-05 NOTE — Telephone Encounter (Signed)
NEW MESSAGE    *STAT* If patient is at the pharmacy, call can be transferred to refill team.   1. Which medications need to be refilled? (please list name of each medication and dose if known) metoprolol tartrate (LOPRESSOR) 25 MG tablet ,clopidogrel (PLAVIX) 75 MG tablet AND atorvastatin (LIPITOR) 80 MG tablet  2. Which pharmacy/location (including street and city if local pharmacy) is medication to be sent to?[facility] [place], [place] - [address]  3. Do they need a 30 day or 90 day supply? 30, patient requesting more refills be added to prescriptions

## 2017-12-07 ENCOUNTER — Encounter (HOSPITAL_COMMUNITY): Payer: BC Managed Care – PPO

## 2017-12-09 ENCOUNTER — Encounter (HOSPITAL_COMMUNITY): Payer: BC Managed Care – PPO

## 2017-12-12 ENCOUNTER — Encounter (HOSPITAL_COMMUNITY): Payer: BC Managed Care – PPO

## 2017-12-14 ENCOUNTER — Encounter (HOSPITAL_COMMUNITY): Payer: BC Managed Care – PPO

## 2017-12-16 ENCOUNTER — Encounter (HOSPITAL_COMMUNITY): Payer: BC Managed Care – PPO

## 2017-12-29 ENCOUNTER — Telehealth: Payer: Self-pay | Admitting: Interventional Cardiology

## 2017-12-29 NOTE — Telephone Encounter (Signed)
New message:     Pt is calling to see if she can take Zyrtec along with her other medications.

## 2017-12-29 NOTE — Telephone Encounter (Signed)
Spoke with pt and advised her ok to take plain Zyrtec or plain Claritin and to avoid the Zyrtec D/Allegra D/Claritin D.  Pt verbalized understanding and was appreciative for call.

## 2018-01-18 ENCOUNTER — Ambulatory Visit: Payer: BC Managed Care – PPO | Admitting: Interventional Cardiology

## 2018-01-18 ENCOUNTER — Encounter: Payer: Self-pay | Admitting: Interventional Cardiology

## 2018-01-18 VITALS — BP 133/90 | HR 68 | Ht 64.0 in | Wt 265.2 lb

## 2018-01-18 DIAGNOSIS — E7800 Pure hypercholesterolemia, unspecified: Secondary | ICD-10-CM

## 2018-01-18 DIAGNOSIS — E1169 Type 2 diabetes mellitus with other specified complication: Secondary | ICD-10-CM

## 2018-01-18 DIAGNOSIS — I10 Essential (primary) hypertension: Secondary | ICD-10-CM | POA: Diagnosis not present

## 2018-01-18 DIAGNOSIS — E669 Obesity, unspecified: Secondary | ICD-10-CM

## 2018-01-18 DIAGNOSIS — I25709 Atherosclerosis of coronary artery bypass graft(s), unspecified, with unspecified angina pectoris: Secondary | ICD-10-CM

## 2018-01-18 HISTORY — DX: Pure hypercholesterolemia, unspecified: E78.00

## 2018-01-18 MED ORDER — OLMESARTAN MEDOXOMIL 5 MG PO TABS: 5.0000 mg | ORAL_TABLET | Freq: Every day | ORAL | 3 refills | Status: DC

## 2018-01-18 MED ORDER — ATORVASTATIN CALCIUM 40 MG PO TABS: 40.0000 mg | ORAL_TABLET | Freq: Every day | ORAL | 3 refills | Status: DC

## 2018-01-18 NOTE — Progress Notes (Signed)
Cardiology Office Note    Date:  01/18/2018   ID:  [name], DOB [birth year], MRN [id number]  PCP:  Donald Prose, MD  Cardiologist: Sinclair Grooms, MD   Chief Complaint  Patient presents with  . Coronary Artery Disease    History of Present Illness:  [name] is a 37 y.o. female  with a hx of diabetes type II with elevated A1c, obesity status post lap band procedure in 2009, coronary artery disease status post inferior STEMI followed by CABG (LIMA to LAD, left radial to diagonal, SVG sequential to PDA and PL) in November 2018.   She is back at work as a Astronomer.  She has stress at work.  She could not complete the full cardiac rehab program because of reset of her insurance policy and co-pays.  She has not had angina.  She gets anxiety around IEP's and parent conferences which caused her to have palpitations.  She may have brief shortness of breath at night immediately upon lying down but that improves after lying for several minutes.  She denies edema.    Past Medical History:  Diagnosis Date  . Diabetes mellitus     Past Surgical History:  Procedure Laterality Date  . CORONARY ARTERY BYPASS GRAFT N/A 08/02/2017   Procedure: CORONARY ARTERY BYPASS GRAFTING (CABG) X 5 (LIMA to LAD, LEFT RADIAL ARTERY to DIAGONAL, SVG to SEQUENTIALLY PDA and PLB) , USING LEFT INTERNAL MAMMARY ARTERY, LEFT RADIAL ARTERY, AND  SAPHENOUS VEIN to [place], RIGHT GREATER SAPHENOUS VEIN HARVESTED ENDOSCOPICALLY;  Surgeon: Grace Isaac, MD;  Location: [facility];  Service: Open Heart Surgery;  Laterality: N/A;  . CORONARY/GRAFT ACUTE MI REVASCULARIZATION N/A 07/24/2017   Procedure: Coronary/Graft Acute MI Revascularization;  Surgeon: Belva Crome, MD;  Location: [place] [facility];  Service: Cardiovascular;  Laterality: N/A;  . LAPAROSCOPIC GASTRIC BANDING  09/02/2008  . LEFT HEART CATH AND CORONARY ANGIOGRAPHY N/A 07/24/2017   Procedure: LEFT HEART CATH AND CORONARY  ANGIOGRAPHY;  Surgeon: Belva Crome, MD;  Location: [place];  Service: Cardiovascular;  Laterality: N/A;  . RADIAL ARTERY HARVEST Left 08/02/2017   Procedure: LEFT RADIAL ARTERY HARVEST;  Surgeon: Grace Isaac, MD;  Location: [facility];  Service: Open Heart Surgery;  Laterality: Left;  . STERNAL CLOSURE N/A 08/02/2017   Procedure: STERNAL PLATING;  Surgeon: Grace Isaac, MD;  Location: [place];  Service: Open Heart Surgery;  Laterality: N/A;  . [place]   had metal removed from stomach as a child  . TEE WITHOUT CARDIOVERSION N/A 08/02/2017   Procedure: TRANSESOPHAGEAL ECHOCARDIOGRAM (TEE);  Surgeon: Grace Isaac, MD;  Location: [facility];  Service: Open Heart Surgery;  Laterality: N/A;    Current Medications: Outpatient Medications Prior to Visit  Medication Sig Dispense Refill  . acetaminophen (TYLENOL) 500 MG tablet Take 500 mg by mouth every 6 (six) hours as needed for mild pain or headache.    . APPLE CIDER VINEGAR PO Take 2-4 mg daily by mouth.    Marland Kitchen aspirin EC 81 MG EC tablet Take 1 tablet (81 mg total) daily by mouth.    . clopidogrel (PLAVIX) 75 MG tablet Take 1 tablet (75 mg total) by mouth daily. 90 tablet 2  . Coenzyme Q10 (COQ10) 100 MG CAPS Take 100 mg by mouth daily.    . colchicine 0.6 MG tablet Take two (2) tablets by mouth at time of initial pain. Then take one (  1) tablet by mouth twice daily as needed for gout.    . insulin detemir (LEVEMIR) 100 UNIT/ML injection Inject 0.26 mLs (26 Units total) daily into the skin. 10 mL 11  . metoprolol tartrate (LOPRESSOR) 25 MG tablet Take 1 tablet (25 mg total) by mouth 2 (two) times daily. 180 tablet 2  . Misc Natural Products (DAILY HERBS BONE/JOINTS PO) Take 3-5 tablets daily by mouth. Insta flex    . atorvastatin (LIPITOR) 80 MG tablet Take 1 tablet (80 mg total) by mouth daily at 6 PM. 90 tablet 2   No facility-administered medications prior to visit.      Allergies:   Patient has no known  allergies.   Social History   Socioeconomic History  . Marital status: Single    Spouse name: Not on file  . Number of children: Not on file  . Years of education: Not on file  . Highest education level: Not on file  Occupational History  . Not on file  Social Needs  . Financial resource strain: Not on file  . Food insecurity:    Worry: Not on file    Inability: Not on file  . Transportation needs:    Medical: Not on file    Non-medical: Not on file  Tobacco Use  . Smoking status: Never Smoker  . Smokeless tobacco: Never Used  Substance and Sexual Activity  . Alcohol use: Yes    Comment: occ  . Drug use: No  . Sexual activity: Not on file  Lifestyle  . Physical activity:    Days per week: Not on file    Minutes per session: Not on file  . Stress: Not on file  Relationships  . Social connections:    Talks on phone: Not on file    Gets together: Not on file    Attends religious service: Not on file    Active member of club or organization: Not on file    Attends meetings of clubs or organizations: Not on file    Relationship status: Not on file  Other Topics Concern  . Not on file  Social History Narrative  . Not on file     Family History:  The patient's family history includes Cancer in her maternal grandfather and maternal grandmother; Diabetes in her father; Heart disease in her father; Hypertension in her father.   ROS:   Please see the history of present illness.    Dizzy, keloid, and brusing problems.  All other systems reviewed and are negative.   PHYSICAL EXAM:   VS:  BP 133/90   Pulse 68   Ht 5\' 4"  (1.626 m)   Wt 265 lb 3.2 oz (120.3 kg)   BMI 45.52 kg/m    GEN: Well nourished, well developed, in no acute distress.  Morbidly obese. Skin: Left arm, sternal, and right lower extremity keloids at the site of incisions. HEENT: normal  Neck: no JVD, carotid bruits, or masses Cardiac: RRR; no murmurs, rubs, or gallops,no edema  Respiratory:  clear  to auscultation bilaterally, normal work of breathing GI: soft, nontender, nondistended, + BS MS: no deformity or atrophy  Skin: warm and dry, no rash Neuro:  Alert and Oriented x 3, Strength and sensation are intact Psych: euthymic mood, full affect  Wt Readings from Last 3 Encounters:  01/18/18 265 lb 3.2 oz (120.3 kg)  10/14/17 273 lb (123.8 kg)  09/22/17 276 lb 14.4 oz (125.6 kg)      Studies/Labs Reviewed:  EKG:  EKG  Not repeated  Recent Labs: 08/03/2017: Magnesium 2.4 09/14/2017: ALT 18; BUN 9; Creatinine, Ser 0.75; Hemoglobin 13.3; Platelets 320; Potassium 4.1; Sodium 141   Lipid Panel    Component Value Date/Time   CHOL 101 09/14/2017 0949   TRIG 66 09/14/2017 0949   HDL 40 09/14/2017 0949   CHOLHDL 2.5 09/14/2017 0949   CHOLHDL 3.6 07/26/2017 0250   VLDL 25 07/26/2017 0250   LDLCALC 48 09/14/2017 0949    Additional studies/ records that were reviewed today include:  No new data.  Last LDL cholesterol was 48 in December 2018.    ASSESSMENT:    1. Coronary artery disease involving coronary bypass graft of native heart with angina pectoris ([place])   2. Diabetes mellitus type 2 in obese ([place])   3. Morbid obesity ([place])   4. Pure hypercholesterolemia with target low density lipoprotein (LDL) cholesterol less than 70 mg/dL   5. Essential hypertension      PLAN:  In order of problems listed above:  1. Decrease metoprolol to 25 mg daily.  Aerobic activity. 2. Refer to Dr. Michiel Sites for management of type 2 diabetes with A1c is still greater than 8.  Started olmesartan 5 mg/day. 3. Aerobic activity and decrease caloric intake is recommended. 4. Decrease atorvastatin to 40 mg/day.  Check liver and lipid panel in 2 months. 5. Blood pressure target 130/80 mmHg or less.  Adding olmesartan 5 mg/day will help.  Up titration as tolerated.  Basic metabolic panel in 2 weeks.  Follow-up with me in 6 months.    Medication Adjustments/Labs and Tests Ordered: Current  medicines are reviewed at length with the patient today.  Concerns regarding medicines are outlined above.  Medication changes, Labs and Tests ordered today are listed in the Patient Instructions below. Patient Instructions  Medication Instructions:  1) START Olmesartan 5mg  once daily 2) DECREASE Atorvastatin to 40mg  once daily  Labwork: Your physician recommends that you return for lab work in: 7-10 days (BMET)  Your physician recommends that you return for lab work in: 8 weeks (Liver, Lipid).  Please make sure you come for these labs fasting. (Nothing to eat or drink after midnight)   Testing/Procedures: None  Follow-Up: Your physician wants you to follow-up in: 6 months with Dr. Tamala Julian.  You will receive a reminder letter in the mail two months in advance. If you don't receive a letter, please call our office to schedule the follow-up appointment.   Any Other Special Instructions Will Be Listed Below (If Applicable).  You have been referred to Dr. Chalmers Cater at [facility] for Endocrinology.   If you need a refill on your cardiac medications before your next appointment, please call your pharmacy.      Signed, Sinclair Grooms, MD  01/18/2018 5:03 PM    [facility] [place], [place], [place]  [address] Phone: [phone]; Fax: [phone]

## 2018-01-18 NOTE — Patient Instructions (Addendum)
Medication Instructions:  1) START Olmesartan 5mg  once daily 2) DECREASE Atorvastatin to 40mg  once daily  Labwork: Your physician recommends that you return for lab work in: 7-10 days (BMET)  Your physician recommends that you return for lab work in: 8 weeks (Liver, Lipid).  Please make sure you come for these labs fasting. (Nothing to eat or drink after midnight)   Testing/Procedures: None  Follow-Up: Your physician wants you to follow-up in: 6 months with Dr. Tamala Julian.  You will receive a reminder letter in the mail two months in advance. If you don't receive a letter, please call our office to schedule the follow-up appointment.   Any Other Special Instructions Will Be Listed Below (If Applicable).  You have been referred to Dr. Chalmers Cater at [facility] for Endocrinology.   If you need a refill on your cardiac medications before your next appointment, please call your pharmacy.

## 2018-01-26 ENCOUNTER — Other Ambulatory Visit: Payer: BC Managed Care – PPO

## 2018-01-26 DIAGNOSIS — I25709 Atherosclerosis of coronary artery bypass graft(s), unspecified, with unspecified angina pectoris: Secondary | ICD-10-CM

## 2018-01-27 LAB — BASIC METABOLIC PANEL
BUN/Creatinine Ratio: 14 (ref 9–23)
BUN: 12 mg/dL (ref 6–20)
CO2: 22 mmol/L (ref 20–29)
Calcium: 9.3 mg/dL (ref 8.7–10.2)
Chloride: 104 mmol/L (ref 96–106)
Creatinine, Ser: 0.86 mg/dL (ref 0.57–1.00)
GFR calc Af Amer: 100 mL/min/{1.73_m2} (ref >59)
GFR calc non Af Amer: 87 mL/min/{1.73_m2} (ref >59)
Glucose: 80 mg/dL (ref 65–99)
Potassium: 4.5 mmol/L (ref 3.5–5.2)
Sodium: 143 mmol/L (ref 134–144)

## 2018-02-05 ENCOUNTER — Other Ambulatory Visit: Payer: Self-pay | Admitting: Physician Assistant

## 2018-03-16 ENCOUNTER — Other Ambulatory Visit: Payer: BC Managed Care – PPO | Admitting: *Deleted

## 2018-03-16 DIAGNOSIS — I25709 Atherosclerosis of coronary artery bypass graft(s), unspecified, with unspecified angina pectoris: Secondary | ICD-10-CM

## 2018-03-16 LAB — LIPID PANEL
Chol/HDL Ratio: 2.7 ratio (ref 0.0–4.4)
Cholesterol, Total: 103 mg/dL (ref 100–199)
HDL: 38 mg/dL — ABNORMAL LOW (ref >39)
LDL Calculated: 51 mg/dL (ref 0–99)
Triglycerides: 72 mg/dL (ref 0–149)
VLDL Cholesterol Cal: 14 mg/dL (ref 5–40)

## 2018-03-16 LAB — HEPATIC FUNCTION PANEL
ALT: 14 IU/L (ref 0–32)
AST: 16 IU/L (ref 0–40)
Albumin: 3.7 g/dL (ref 3.5–5.5)
Alkaline Phosphatase: 98 IU/L (ref 39–117)
Bilirubin Total: 0.3 mg/dL (ref 0.0–1.2)
Bilirubin, Direct: 0.14 mg/dL (ref 0.00–0.40)
Total Protein: 6.8 g/dL (ref 6.0–8.5)

## 2018-05-15 DIAGNOSIS — Z0289 Encounter for other administrative examinations: Secondary | ICD-10-CM

## 2018-05-23 ENCOUNTER — Encounter (INDEPENDENT_AMBULATORY_CARE_PROVIDER_SITE_OTHER): Payer: BC Managed Care – PPO

## 2018-06-08 ENCOUNTER — Ambulatory Visit (INDEPENDENT_AMBULATORY_CARE_PROVIDER_SITE_OTHER): Payer: BC Managed Care – PPO | Admitting: Bariatrics

## 2018-06-08 ENCOUNTER — Encounter (INDEPENDENT_AMBULATORY_CARE_PROVIDER_SITE_OTHER): Payer: Self-pay | Admitting: Bariatrics

## 2018-06-08 VITALS — BP 116/80 | HR 82 | Temp 97.8°F | Ht 63.0 in | Wt 260.0 lb

## 2018-06-08 DIAGNOSIS — Z9189 Other specified personal risk factors, not elsewhere classified: Secondary | ICD-10-CM | POA: Diagnosis not present

## 2018-06-08 DIAGNOSIS — R5383 Other fatigue: Secondary | ICD-10-CM

## 2018-06-08 DIAGNOSIS — Z1331 Encounter for screening for depression: Secondary | ICD-10-CM

## 2018-06-08 DIAGNOSIS — Z6841 Body Mass Index (BMI) 40.0 and over, adult: Secondary | ICD-10-CM

## 2018-06-08 DIAGNOSIS — E7849 Other hyperlipidemia: Secondary | ICD-10-CM | POA: Diagnosis not present

## 2018-06-08 DIAGNOSIS — R0602 Shortness of breath: Secondary | ICD-10-CM | POA: Diagnosis not present

## 2018-06-08 DIAGNOSIS — E119 Type 2 diabetes mellitus without complications: Secondary | ICD-10-CM | POA: Diagnosis not present

## 2018-06-13 LAB — COMPREHENSIVE METABOLIC PANEL
ALT: 13 IU/L (ref 0–32)
AST: 13 IU/L (ref 0–40)
Albumin/Globulin Ratio: 1.3 (ref 1.2–2.2)
Albumin: 3.7 g/dL (ref 3.5–5.5)
Alkaline Phosphatase: 81 IU/L (ref 39–117)
BUN/Creatinine Ratio: 19 (ref 9–23)
BUN: 17 mg/dL (ref 6–20)
Bilirubin Total: 0.5 mg/dL (ref 0.0–1.2)
CO2: 19 mmol/L — ABNORMAL LOW (ref 20–29)
Calcium: 8.9 mg/dL (ref 8.7–10.2)
Chloride: 103 mmol/L (ref 96–106)
Creatinine, Ser: 0.89 mg/dL (ref 0.57–1.00)
GFR calc Af Amer: 95 mL/min/{1.73_m2} (ref >59)
GFR calc non Af Amer: 82 mL/min/{1.73_m2} (ref >59)
Globulin, Total: 2.9 g/dL (ref 1.5–4.5)
Glucose: 116 mg/dL — ABNORMAL HIGH (ref 65–99)
Potassium: 4.2 mmol/L (ref 3.5–5.2)
Sodium: 138 mmol/L (ref 134–144)
Total Protein: 6.6 g/dL (ref 6.0–8.5)

## 2018-06-13 LAB — HEMOGLOBIN A1C
Est. average glucose Bld gHb Est-mCnc: 131 mg/dL
Hgb A1c MFr Bld: 6.2 % — ABNORMAL HIGH (ref 4.8–5.6)

## 2018-06-13 LAB — INSULIN, RANDOM: INSULIN: 16.3 u[IU]/mL (ref 2.6–24.9)

## 2018-06-13 LAB — LIPID PANEL WITH LDL/HDL RATIO
Cholesterol, Total: 112 mg/dL (ref 100–199)
HDL: 37 mg/dL — ABNORMAL LOW (ref >39)
LDL Calculated: 62 mg/dL (ref 0–99)
LDl/HDL Ratio: 1.7 ratio (ref 0.0–3.2)
Triglycerides: 66 mg/dL (ref 0–149)
VLDL Cholesterol Cal: 13 mg/dL (ref 5–40)

## 2018-06-13 LAB — T4, FREE: Free T4: 1.58 ng/dL (ref 0.82–1.77)

## 2018-06-13 LAB — VITAMIN D 25 HYDROXY (VIT D DEFICIENCY, FRACTURES): Vit D, 25-Hydroxy: 25.7 ng/mL — ABNORMAL LOW (ref 30.0–100.0)

## 2018-06-13 LAB — FOLATE: Folate: 13 ng/mL (ref >3.0)

## 2018-06-13 LAB — VITAMIN B12: Vitamin B-12: >2000 pg/mL — ABNORMAL HIGH (ref 232–1245)

## 2018-06-13 LAB — T3: T3, Total: 110 ng/dL (ref 71–180)

## 2018-06-13 LAB — TSH: TSH: 1.56 u[IU]/mL (ref 0.450–4.500)

## 2018-06-13 NOTE — Progress Notes (Signed)
Office: [phone]  /  Fax: [phone]   Dear Dr. Nancy Fetter,   Thank you for referring [name] to our clinic. The following note includes my evaluation and treatment recommendations.  HPI:   Chief Complaint: OBESITY    [name] has been referred by Donald Prose, MD for consultation regarding her obesity and obesity related comorbidities.    [name] (MR# [id number]) is a 37 y.o. female who presents on 06/13/2018 for obesity evaluation and treatment. Current BMI is Body mass index is 46.06 kg/m.[name] has been struggling with her weight for many years and has been unsuccessful in either losing weight, maintaining weight loss, or reaching her healthy weight goal.     Camay attended our information session and states she is currently in the action stage of change and ready to dedicate time achieving and maintaining a healthier weight. Lindyn is interested in becoming our patient and working on intensive lifestyle modifications including (but not limited to) diet, exercise and weight loss.    Eather states her desired weight loss is 83 lbs she has been heavy most of  her life she started gaining weight at age 30 her heaviest weight ever was 347 lbs. she has significant food cravings issues  she snacks frequently in the evenings she skips meals frequently she is frequently drinking liquids with calories she frequently makes poor food choices she has problems with excessive hunger  she struggles with emotional eating   Adjustable Gastric Banding (Lap Band) placed 2008/2009. Her heaviest weight was at 347 pounds and her lowest weight was at 220 pounds. Jimi feels like she has minimal resistance and her last adjustment was five to seven years ago.   Fatigue Ollie feels her energy is lower than it should be. This has worsened with weight gain and has not worsened recently. Renate admits to daytime somnolence and admits to waking up still tired. Patient is  at risk for obstructive sleep apnea, secondary to obesity. Patent has a history of symptoms of daytime fatigue and morning fatigue. Patient generally gets 6 to 8 hours of sleep per night, and states she is sleeping okay. Snoring is not present. Apneic episodes are present. Epworth Sleepiness Score is 8  Dyspnea on exertion Bryley notes increasing shortness of breath with exercising and seems to be worsening over time with weight gain. She notes getting out of breath sooner with activity than she used to. This has not gotten worse recently and is stable and improved since MI (November 2018). She went to maintenance cardiac rehab.Duwaine Maxin denies orthopnea.  Diabetes II Lequita has a diagnosis of diabetes type II and she is taking Trulicity and Metformin. Marlenne sees an endocrinologist every four months. Last A1c was at 7.0 approximately November 2018 per patient. She has been working on intensive lifestyle modifications including diet, exercise, and weight loss to help control her blood glucose levels. Helvi denies polyphagia.  Hyperlipidemia Tawnee has hyperlipidemia and she is attempting to improve her cholesterol levels with intensive lifestyle modification including a low saturated fat diet, exercise and weight loss. She has a history of myocardial infarction and she is seeing cardiology. Jaidon is taking atorvastatin without side effects. She denies any chest pain.  Depression with emotional eating behaviors Marynell is struggling with emotional eating and using food for comfort to the extent that it is negatively impacting her health. She often snacks when she is not hungry. Deirdra sometimes feels she is out of control and then feels guilty that  she made poor food choices. She is attempting to work on behavior modification techniques to help reduce her emotional eating. She shows no sign of suicidal or homicidal ideations.  Depression screen [facility] 2/9 06/08/2018 09/26/2017  Decreased Interest 3  0  Down, Depressed, Hopeless 1 0  PHQ - 2 Score 4 0  Altered sleeping 1 -  Tired, decreased energy 3 -  Change in appetite 2 -  Feeling bad or failure about yourself  3 -  Trouble concentrating 1 -  Moving slowly or fidgety/restless 2 -  Suicidal thoughts 0 -  PHQ-9 Score 16 -  Difficult doing work/chores Not difficult at all -     Depression Screen Saleema's Food and Mood (modified PHQ-9) score was  Depression screen PHQ 2/9 06/08/2018  Decreased Interest 3  Down, Depressed, Hopeless 1  PHQ - 2 Score 4  Altered sleeping 1  Tired, decreased energy 3  Change in appetite 2  Feeling bad or failure about yourself  3  Trouble concentrating 1  Moving slowly or fidgety/restless 2  Suicidal thoughts 0  PHQ-9 Score 16  Difficult doing work/chores Not difficult at all    ALLERGIES: No Known Allergies  MEDICATIONS: Current Outpatient Medications on File Prior to Visit  Medication Sig Dispense Refill  . acetaminophen (TYLENOL) 500 MG tablet Take 500 mg by mouth every 6 (six) hours as needed for mild pain or headache.    Holly Kitchen aspirin EC 81 MG EC tablet Take 1 tablet (81 mg total) daily by mouth.    Holly Kitchen atorvastatin (LIPITOR) 40 MG tablet Take 1 tablet (40 mg total) by mouth daily. 90 tablet 3  . clopidogrel (PLAVIX) 75 MG tablet Take 1 tablet (75 mg total) by mouth daily. 90 tablet 2  . Coenzyme Q10 (COQ10) 100 MG CAPS Take 100 mg by mouth daily.    . colchicine 0.6 MG tablet Take two (2) tablets by mouth at time of initial pain. Then take one (1) tablet by mouth twice daily as needed for gout.    . Dulaglutide (TRULICITY) 1.5 UX/3.2TF SOPN Inject 1 Dose into the skin once a week.    . metFORMIN (GLUCOPHAGE) 500 MG tablet Take 500 mg by mouth 2 (two) times daily with a meal.    . metoprolol tartrate (LOPRESSOR) 25 MG tablet Take 1 tablet (25 mg total) by mouth 2 (two) times daily. 180 tablet 2  . Misc Natural Products (DAILY HERBS BONE/JOINTS PO) Take 3-5 tablets daily by mouth. Insta  flex    . olmesartan (BENICAR) 5 MG tablet Take 1 tablet (5 mg total) by mouth daily. 90 tablet 3   No current facility-administered medications on file prior to visit.     PAST MEDICAL HISTORY: Past Medical History:  Diagnosis Date  . Anxiety   . Back pain   . Chest pain   . Coronary artery disease   . Diabetes mellitus   . Difficulty swallowing pills   . Fatigue   . GERD (gastroesophageal reflux disease)   . Hay fever   . Headache   . Heartburn   . History of heart attack   . HTN (hypertension)   . Hyperlipidemia   . Joint pain   . Knee pain   . Nervousness   . Palpitations   . Shortness of breath   . Shortness of breath on exertion   . Stress     PAST SURGICAL HISTORY: Past Surgical History:  Procedure Laterality Date  . CORONARY ARTERY BYPASS GRAFT  N/A 08/02/2017   Procedure: CORONARY ARTERY BYPASS GRAFTING (CABG) X 5 (LIMA to LAD, LEFT RADIAL ARTERY to DIAGONAL, SVG to SEQUENTIALLY PDA and PLB) , USING LEFT INTERNAL MAMMARY ARTERY, LEFT RADIAL ARTERY, AND  SAPHENOUS VEIN to [place], RIGHT GREATER SAPHENOUS VEIN HARVESTED ENDOSCOPICALLY;  Surgeon: Grace Isaac, MD;  Location: [place];  Service: Open Heart Surgery;  Laterality: N/A;  . CORONARY/GRAFT ACUTE MI REVASCULARIZATION N/A 07/24/2017   Procedure: Coronary/Graft Acute MI Revascularization;  Surgeon: Belva Crome, MD;  Location: [place] [facility];  Service: Cardiovascular;  Laterality: N/A;  . LAPAROSCOPIC GASTRIC BANDING  09/02/2008  . LEFT HEART CATH AND CORONARY ANGIOGRAPHY N/A 07/24/2017   Procedure: LEFT HEART CATH AND CORONARY ANGIOGRAPHY;  Surgeon: Belva Crome, MD;  Location: [place] [facility];  Service: Cardiovascular;  Laterality: N/A;  . RADIAL ARTERY HARVEST Left 08/02/2017   Procedure: LEFT RADIAL ARTERY HARVEST;  Surgeon: Grace Isaac, MD;  Location: [place];  Service: Open Heart Surgery;  Laterality: Left;  . STERNAL CLOSURE N/A 08/02/2017   Procedure: STERNAL PLATING;  Surgeon:  Grace Isaac, MD;  Location: [place];  Service: Open Heart Surgery;  Laterality: N/A;  . [place]   had metal removed from stomach as a child  . TEE WITHOUT CARDIOVERSION N/A 08/02/2017   Procedure: TRANSESOPHAGEAL ECHOCARDIOGRAM (TEE);  Surgeon: Grace Isaac, MD;  Location: [facility] [place];  Service: Open Heart Surgery;  Laterality: N/A;    SOCIAL HISTORY: Social History   Tobacco Use  . Smoking status: Never Smoker  . Smokeless tobacco: Never Used  Substance Use Topics  . Alcohol use: Yes    Comment: occ  . Drug use: No    FAMILY HISTORY: Family History  Problem Relation Age of Onset  . Diabetes Father   . Hypertension Father   . Heart disease Father   . Sudden death Father   . Hyperlipidemia Mother   . Hypertension Mother   . Obesity Mother   . Cancer Maternal Grandmother        lung  . Cancer Maternal Grandfather        prostate    ROS: Review of Systems  Constitutional: Positive for malaise/fatigue.  HENT:       + Hay Fever + Difficult or Painful Swallowing   Respiratory: Positive for shortness of breath (with activity).   Cardiovascular: Positive for chest pain (Chest Pain/Discomfort) and palpitations. Negative for orthopnea.       + Sudden Awakening from Sleep with Shortness of Breath  Gastrointestinal: Positive for heartburn.       + Swallowing Difficulty  Musculoskeletal: Positive for back pain.  Neurological: Positive for headaches.  Endo/Heme/Allergies:       Negative for polyphagia  Psychiatric/Behavioral: Positive for depression. Negative for suicidal ideas. The patient is nervous/anxious (nervousness).        + Stress    PHYSICAL EXAM: Blood pressure 116/80, pulse 82, temperature 97.8 F (36.6 C), temperature source Oral, height 5\' 3"  (1.6 m), weight 260 lb (117.9 kg), SpO2 100 %. Body mass index is 46.06 kg/m. Physical Exam  Constitutional: She is oriented to person, place, and time. She appears well-developed and  well-nourished.  HENT:  Head: Normocephalic and atraumatic.  Nose: Nose normal.  Mallanpati = 3  Eyes: EOM are normal. No scleral icterus.  Neck: Normal range of motion. Neck supple. No thyromegaly present.  Cardiovascular: Normal rate and regular rhythm.  Pulmonary/Chest: Effort normal. No respiratory distress.  Abdominal:  Soft. There is no tenderness.  + Obesity  Musculoskeletal: Normal range of motion.  Range of Motion normal in all 4 extremities  Neurological: She is alert and oriented to person, place, and time. Coordination normal.  Skin: Skin is warm and dry.  Psychiatric: She has a normal mood and affect. Her behavior is normal.  Vitals reviewed.   RECENT LABS AND TESTS: BMET    Component Value Date/Time   NA 138 06/12/2018 0808   K 4.2 06/12/2018 0808   CL 103 06/12/2018 0808   CO2 19 (L) 06/12/2018 0808   GLUCOSE 116 (H) 06/12/2018 0808   GLUCOSE 150 (H) 08/08/2017 0348   BUN 17 06/12/2018 0808   CREATININE 0.89 06/12/2018 0808   CALCIUM 8.9 06/12/2018 0808   GFRNONAA 82 06/12/2018 0808   GFRAA 95 06/12/2018 0808   Lab Results  Component Value Date   HGBA1C 6.2 (H) 06/12/2018   Lab Results  Component Value Date   INSULIN 16.3 06/12/2018   CBC    Component Value Date/Time   WBC 6.8 09/14/2017 0949   WBC 9.6 08/08/2017 0348   RBC 5.08 09/14/2017 0949   RBC 2.89 (L) 08/08/2017 0348   HGB 13.3 09/14/2017 0949   HCT 39.9 09/14/2017 0949   PLT 320 09/14/2017 0949   MCV 79 09/14/2017 0949   MCH 26.2 (L) 09/14/2017 0949   MCH 27.3 08/08/2017 0348   MCHC 33.3 09/14/2017 0949   MCHC 33.3 08/08/2017 0348   RDW 13.8 09/14/2017 0949   LYMPHSABS 1.8 07/24/2017 0518   MONOABS 0.7 07/24/2017 0518   EOSABS 0.1 07/24/2017 0518   BASOSABS 0.0 07/24/2017 0518   Iron/TIBC/Ferritin/ %Sat No results found for: IRON, TIBC, FERRITIN, IRONPCTSAT Lipid Panel     Component Value Date/Time   CHOL 112 06/12/2018 0808   TRIG 66 06/12/2018 0808   HDL 37 (L) 06/12/2018  0808   CHOLHDL 2.7 03/16/2018 0936   CHOLHDL 3.6 07/26/2017 0250   VLDL 25 07/26/2017 0250   LDLCALC 62 06/12/2018 0808   Hepatic Function Panel     Component Value Date/Time   PROT 6.6 06/12/2018 0808   ALBUMIN 3.7 06/12/2018 0808   AST 13 06/12/2018 0808   ALT 13 06/12/2018 0808   ALKPHOS 81 06/12/2018 0808   BILITOT 0.5 06/12/2018 0808   BILIDIR 0.14 03/16/2018 0936      Component Value Date/Time   TSH 1.560 06/12/2018 0808    ECG  shows NSR with a rate of 77 BPM INDIRECT CALORIMETER done today shows a VO2 of 248 and a REE of 1727.  Her calculated basal metabolic rate is 6256 thus her basal metabolic rate is worse than expected.    ASSESSMENT AND PLAN: Other fatigue - Plan: EKG 12-Lead, Vitamin B12, Folate, T3, T4, free, TSH, VITAMIN D 25 Hydroxy (Vit-D Deficiency, Fractures)  Shortness of breath on exertion  Type 2 diabetes mellitus without complication, without long-term current use of insulin (HCC) - Plan: Comprehensive metabolic panel, Hemoglobin A1c, Insulin, random  Other hyperlipidemia - Plan: Lipid Panel With LDL/HDL Ratio  Depression screening  At risk for osteoporosis  Class 3 severe obesity with serious comorbidity and body mass index (BMI) of 45.0 to 49.9 in adult, unspecified obesity type ([place])  PLAN: Fatigue Hiliana was informed that her fatigue may be related to obesity, depression or many other causes. Labs will be ordered, and in the meanwhile Franchelle has agreed to work on diet and weight loss to help with fatigue. Glennette will increase resistance  and cardio exercise per her cardiologist. Proper sleep hygiene was discussed including the need for 7-8 hours of quality sleep each night. A sleep study was not ordered at this time based on symptoms and Epworth score.  Dyspnea on exertion Ivelisse's shortness of breath appears to be obesity related and exercise induced. She has agreed to work on weight loss and gradually increase exercise to treat her  exercise induced shortness of breath. If Montie follows our instructions and loses weight without improvement of her shortness of breath, we will plan to refer to pulmonology. We will monitor this condition regularly. Khyla agrees to this plan.  Diabetes II Destini has been given extensive diabetes education by myself today including ideal fasting and post-prandial blood glucose readings, individual ideal Hgb A1c goals and hypoglycemia prevention. We discussed the importance of good blood sugar control to decrease the likelihood of diabetic complications such as nephropathy, neuropathy, limb loss, blindness, coronary artery disease, and death. We discussed the importance of intensive lifestyle modification including diet, exercise and weight loss as the first line treatment for diabetes. Jourdan will work on decreasing carbohydrates and increasing protein. We will do labs today and Vicci will continue to see the endocrinologist. Camisha agrees to continue to take Metformin and Trulicity and will follow up at the agreed upon time.  Hyperlipidemia Adaleigh was informed of the American Heart Association Guidelines emphasizing intensive lifestyle modifications as the first line treatment for hyperlipidemia. We discussed many lifestyle modifications today in depth, and Niyah will continue to work on decreasing saturated fats such as fatty red meat, butter and many fried foods. She will work on decreasing carbohydrates. She will also increase vegetables and lean protein in her diet and continue to work on exercise and weight loss efforts. Senie agrees to continue her atorvastatin and follow up as directed.       Depression with Emotional Eating Behaviors We discussed behavior modification techniques today to help Kelsye deal with her stress eating and depression. We will refer to Dr. Mallie Mussel our bariatric psychologist.   Depression Screen Tobin had a strongly positive depression screening.  Depression is commonly associated with obesity and often results in emotional eating behaviors. We will monitor this closely and work on CBT to help improve the non-hunger eating patterns. Referral to Psychology may be required if no improvement is seen as she continues in our clinic.  Obstructive Sleep Apnea Risk Counseling Bristyn was given extended  (15 minutes) coronary artery disease prevention counseling today. She is 37 y.o. female and has risk factors for obstructive sleep apnea including obesity. We discussed intensive lifestyle modifications today with an emphasis on specific weight loss instructions and strategies.  Obesity Lurlie is currently in the action stage of change and her goal is to continue with weight loss efforts. I recommend Lucresia begin the structured treatment plan as follows:  She has agreed to follow the Category 2 plan +100 calories if needed, plus cottage cheese or 8 oz of Fairlife skim milk Clarissa has been instructed to eventually work up to a goal of 150 minutes of combined cardio and strengthening exercise per week for weight loss and overall health benefits. We discussed the following Behavioral Modification Strategies today: increase H2O intake, no skipping meals, increasing lean protein intake, decreasing simple carbohydrates , increasing vegetables, decrease eating out, work on meal planning and easy cooking plans and decrease liquid calories   Jasie will continue with her lap band at this time and will consider reducing the fluid in her  band if she is unable to eat all of the food on her diet.   She was informed of the importance of frequent follow up visits to maximize her success with intensive lifestyle modifications for her multiple health conditions. She was informed we would discuss her lab results at her next visit unless there is a critical issue that needs to be addressed sooner. Lexxie agreed to keep her next visit at the agreed upon time to  discuss these results.    OBESITY BEHAVIORAL INTERVENTION VISIT  Today's visit was # 1   Starting weight: 260 lbs Starting date: 06/08/18 Today's weight : 260 lbs  Today's date: 06/08/2018 Total lbs lost to date: 0   ASK: We discussed the diagnosis of obesity with Sharmain D Nichols today and Yahaira agreed to give [name] permission to discuss obesity behavioral modification therapy today.  ASSESS: Danyale has the diagnosis of obesity and her BMI today is 46.07 Glendine is in the action stage of change   ADVISE: Arietta was educated on the multiple health risks of obesity as well as the benefit of weight loss to improve her health. She was advised of the need for long term treatment and the importance of lifestyle modifications to improve her current health and to decrease her risk of future health problems.  AGREE: Multiple dietary modification options and treatment options were discussed and  Mylinda agreed to follow the recommendations documented in the above note.  ARRANGE: Kilea was educated on the importance of frequent visits to treat obesity as outlined per CMS and USPSTF guidelines and agreed to schedule her next follow up appointment today.  Corey Skains, am acting as Location manager for [employer]. Owens Shark, DO  I have reviewed the above documentation for accuracy and completeness, and I agree with the above. -Jearld Lesch, DO

## 2018-06-20 NOTE — Progress Notes (Unsigned)
Office: [phone]  /  Fax: [phone] Date: June 26, 2018 Time Seen: *** Duration: *** Provider: Glennie Isle, PsyD Type of Session: Intake for Individual Therapy   Informed Consent:The provider's role was explained to [name]. The provider reviewed and discussed issues of confidentiality, privacy, and limits therein. Since the clinic is not a 24/7 crisis center, mental health emergency resources were shared and a handout was provided. [name] verbally acknowledged understanding, and agreed to use mental health emergency resources discussed if needed. In addition to written consent, verbal informed consent for psychological services was obtained from [name] prior to the initial intake interview. Moreover, [name] agreed information may be shared with other [facility] providers as needed for coordination of care. Written consent was also provided for this provider to coordinate care with other providers at [facility].   Chief Complaint: [name] was referred by Dr. Jearld Romero due to depression with emotional eating behaviors. Per the note for the initial visit with Dr. Jearld Romero on June 08, 2018, "[name] is struggling with emotional eating and using food for comfort to the extent that it is negatively impacting her health. She often snacks when she is not hungry. [name] sometimes feels she is out of control and then feels guilty that she made poor food choices. She is attempting to work on behavior modification techniques to help reduce her emotional eating. She shows no sign of suicidal or homicidal ideations."  [name] was asked to complete a questionnaire assessing various behaviors related to emotional eating. [name] endorsed the following: {gbmoodandfood:21755}.  HPI: Per the note for the initial visit with Dr. Jearld Romero on June 08, 2018, [name] has been heavy most of her life and she started gaining weight at the age of  3. Her heaviest weight ever was 347 pounds. During the initial appointment with Dr. Owens Romero, [facility] reported experiencing the following: significant food cravings issues; snacking frequently in the evening; skipping meals frequently; frequently drinking liquids with calories; making poor food choices; having problems with excessive hunger; and struggling with emotional eating.  Mental Status Examination: [name] arrived on time for the appointment. She presented as appropriately dressed and groomed. [name] appeared her stated age and demonstrated adequate orientation to time, place, person, and purpose of the appointment. She also demonstrated appropriate eye contact. No psychomotor abnormalities or behavioral peculiarities noted. Her mood was {gbmood:21757} with congruent affect. Her thought processes were logical, linear, and goal-directed. No hallucinations, delusions, bizarre thinking or behavior reported or observed. Judgment, insight, and impulse control appeared to be grossly intact. There was no evidence of paraphasias (i.e., errors in speech, gross mispronunciations, and word substitutions), repetition deficits, or disturbances in volume or prosody (i.e., rhythm and intonation). There was no evidence of attention or memory impairments. [name] denied current suicidal and homicidal ideation, plan, and intent.   The Mini-Mental State Examination, Second Edition (MMSE-2) was administered. The MMSE-2 briefly screens for cognitive dysfunction and overall mental status and assesses different cognitive domains: orientation, registration, attention and calculation, recall, and language and praxis. [name] received *** out of 30 points possible on the MMSE-2, which is noted in the *** range. The following points were lost: ***  Family & Psychosocial History: ***  Medical History: ***  Mental Health History: ***  Structured Assessment Results: The Patient Health Questionnaire-9 (PHQ-9) is a self-report  measure that assesses symptoms and severity of depression over the course of the last two weeks. [name] obtained a score of *** suggesting {GBPHQ9SEVERITY:21752}.  [name] finds the endorsed symptoms to be {gbphq9difficulty:21754}.    The Generalized Anxiety Disorder-7 (GAD-7) is a brief self-report measure that assesses symptoms of anxiety over the course of the last two weeks. [name] obtained a score of *** suggesting {gbgad7severity:21753}.  Interventions: A chart review was conducted prior to the clinical intake interview. The MMSE-2, PHQ-9, and GAD-7 were administered and a clinical intake interview was completed. In addition, [name] was asked to complete a Mood and Food questionnaire to assess various behaviors related to emotional eating. Throughout session, empathic reflections and validation was provided. Continuing treatment with this provider was discussed and a treatment goal was established. Psychoeducation regarding emotional versus physical hunger was provided. [name] was given a handout to utilize between now and the next appointment to increase awareness of hunger patterns and subsequent eating. ***  Provisional DSM-5 Diagnosis: ***  Plan: [name] expressed understanding and agreement with the initial treatment plan of care. She appears able and willing to participate as evidenced by collaboration on a treatment goal, engagement in reciprocal conversation, and asking questions as needed for clarification. The next appointment will be scheduled in {gbweeks:21758}. The following treatment goal was established: {gbtxgoals:21759}. For the aforementioned goal, [name] can benefit from biweekly sessions that are brief in duration for approximately four to six sessions.

## 2018-06-26 ENCOUNTER — Ambulatory Visit (INDEPENDENT_AMBULATORY_CARE_PROVIDER_SITE_OTHER): Payer: BC Managed Care – PPO | Admitting: Bariatrics

## 2018-06-26 ENCOUNTER — Ambulatory Visit (INDEPENDENT_AMBULATORY_CARE_PROVIDER_SITE_OTHER): Payer: BC Managed Care – PPO | Admitting: Psychology

## 2018-06-26 VITALS — BP 125/86 | HR 99 | Temp 98.0°F | Ht 63.0 in | Wt 258.0 lb

## 2018-06-26 DIAGNOSIS — E119 Type 2 diabetes mellitus without complications: Secondary | ICD-10-CM

## 2018-06-26 DIAGNOSIS — Z9189 Other specified personal risk factors, not elsewhere classified: Secondary | ICD-10-CM

## 2018-06-26 DIAGNOSIS — E559 Vitamin D deficiency, unspecified: Secondary | ICD-10-CM

## 2018-06-26 DIAGNOSIS — E7849 Other hyperlipidemia: Secondary | ICD-10-CM

## 2018-06-26 DIAGNOSIS — Z6841 Body Mass Index (BMI) 40.0 and over, adult: Secondary | ICD-10-CM

## 2018-06-27 MED ORDER — VITAMIN D (ERGOCALCIFEROL) 1.25 MG (50000 UNIT) PO CAPS: 50000.0000 [IU] | ORAL_CAPSULE | ORAL | 0 refills | Status: DC

## 2018-06-27 NOTE — Progress Notes (Signed)
Office: [phone]  /  Fax: [phone]   HPI:   Chief Complaint: OBESITY [name] is here to discuss her progress with her obesity treatment plan. She is on the  follow the Category 2 plan +100 calories and is following her eating plan approximately 70 % of the time. She states she is exercising 0 minutes 0 times per week. [name] followed the plan fairly close. She denies hunger and any significant cravings. She has not been exercising and states she does not lose if not exercising.   Her weight is 258 lb (117 kg) today and has had a weight loss of 2 pounds over a period of 3 weeks since her last visit. She has lost 2 lbs since starting treatment with [name].  Diabetes II [name] has a diagnosis of diabetes type II. [name]  denies any hypoglycemic episodes. Currently taking trulicity and metformin. She sees her Endocrinologist every 4 months. Last A1c was 6.2. She has been working on intensive lifestyle modifications including diet, exercise, and weight loss to help control her blood glucose levels.  Vitamin D deficiency [name] has a diagnosis of vitamin D deficiency. She is not currently taking vit D and denies nausea, vomiting or muscle weakness.   Ref. Range 06/12/2018 08:08  Vitamin D, 25-Hydroxy Latest Ref Range: 30.0 - 100.0 ng/mL 25.7 (L)   Hyperlipidemia [name] has hyperlipidemia and has been trying to improve her cholesterol levels with intensive lifestyle modification including a low saturated fat diet, exercise and weight loss. She is currently taking Atorvastatin. Her cholesterol is well controlled and HDL is decreased.  She denies any chest pain, claudication or myalgias.  At risk for osteopenia and osteoporosis [name] is at higher risk of osteopenia and osteoporosis due to vitamin D deficiency.    ALLERGIES: No Known Allergies  MEDICATIONS: Current Outpatient Medications on File Prior to Visit  Medication Sig Dispense Refill  . acetaminophen (TYLENOL) 500 MG  tablet Take 500 mg by mouth every 6 (six) hours as needed for mild pain or headache.    Marland Kitchen aspirin EC 81 MG EC tablet Take 1 tablet (81 mg total) daily by mouth.    Marland Kitchen atorvastatin (LIPITOR) 40 MG tablet Take 1 tablet (40 mg total) by mouth daily. 90 tablet 3  . clopidogrel (PLAVIX) 75 MG tablet Take 1 tablet (75 mg total) by mouth daily. 90 tablet 2  . Coenzyme Q10 (COQ10) 100 MG CAPS Take 100 mg by mouth daily.    . colchicine 0.6 MG tablet Take two (2) tablets by mouth at time of initial pain. Then take one (1) tablet by mouth twice daily as needed for gout.    . Dulaglutide (TRULICITY) 1.5 VE/7.2CN SOPN Inject 1 Dose into the skin once a week.    . metFORMIN (GLUCOPHAGE) 500 MG tablet Take 500 mg by mouth 2 (two) times daily with a meal.    . metoprolol tartrate (LOPRESSOR) 25 MG tablet Take 1 tablet (25 mg total) by mouth 2 (two) times daily. 180 tablet 2  . Misc Natural Products (DAILY HERBS BONE/JOINTS PO) Take 3-5 tablets daily by mouth. Insta flex    . olmesartan (BENICAR) 5 MG tablet Take 1 tablet (5 mg total) by mouth daily. 90 tablet 3   No current facility-administered medications on file prior to visit.     PAST MEDICAL HISTORY: Past Medical History:  Diagnosis Date  . Anxiety   . Back pain   . Chest pain   . Coronary artery disease   .  Diabetes mellitus   . Difficulty swallowing pills   . Fatigue   . GERD (gastroesophageal reflux disease)   . Hay fever   . Headache   . Heartburn   . History of heart attack   . HTN (hypertension)   . Hyperlipidemia   . Joint pain   . Knee pain   . Nervousness   . Palpitations   . Shortness of breath   . Shortness of breath on exertion   . Stress     PAST SURGICAL HISTORY: Past Surgical History:  Procedure Laterality Date  . CORONARY ARTERY BYPASS GRAFT N/A 08/02/2017   Procedure: CORONARY ARTERY BYPASS GRAFTING (CABG) X 5 (LIMA to LAD, LEFT RADIAL ARTERY to DIAGONAL, SVG to SEQUENTIALLY PDA and PLB) , USING LEFT INTERNAL  MAMMARY ARTERY, LEFT RADIAL ARTERY, AND  SAPHENOUS VEIN to [place], RIGHT GREATER SAPHENOUS VEIN HARVESTED ENDOSCOPICALLY;  Surgeon: Grace Isaac, MD;  Location: [place];  Service: Open Heart Surgery;  Laterality: N/A;  . CORONARY/GRAFT ACUTE MI REVASCULARIZATION N/A 07/24/2017   Procedure: Coronary/Graft Acute MI Revascularization;  Surgeon: Belva Crome, MD;  Location: [place] [facility];  Service: Cardiovascular;  Laterality: N/A;  . LAPAROSCOPIC GASTRIC BANDING  09/02/2008  . LEFT HEART CATH AND CORONARY ANGIOGRAPHY N/A 07/24/2017   Procedure: LEFT HEART CATH AND CORONARY ANGIOGRAPHY;  Surgeon: Belva Crome, MD;  Location: [place] [facility];  Service: Cardiovascular;  Laterality: N/A;  . RADIAL ARTERY HARVEST Left 08/02/2017   Procedure: LEFT RADIAL ARTERY HARVEST;  Surgeon: Grace Isaac, MD;  Location: [place];  Service: Open Heart Surgery;  Laterality: Left;  . STERNAL CLOSURE N/A 08/02/2017   Procedure: STERNAL PLATING;  Surgeon: Grace Isaac, MD;  Location: [place];  Service: Open Heart Surgery;  Laterality: N/A;  . [place]   had metal removed from stomach as a child  . TEE WITHOUT CARDIOVERSION N/A 08/02/2017   Procedure: TRANSESOPHAGEAL ECHOCARDIOGRAM (TEE);  Surgeon: Grace Isaac, MD;  Location: [place];  Service: Open Heart Surgery;  Laterality: N/A;    SOCIAL HISTORY: Social History   Tobacco Use  . Smoking status: Never Smoker  . Smokeless tobacco: Never Used  Substance Use Topics  . Alcohol use: Yes    Comment: occ  . Drug use: No    FAMILY HISTORY: Family History  Problem Relation Age of Onset  . Diabetes Father   . Hypertension Father   . Heart disease Father   . Sudden death Father   . Hyperlipidemia Mother   . Hypertension Mother   . Obesity Mother   . Cancer Maternal Grandmother        lung  . Cancer Maternal Grandfather        prostate    ROS: Review of Systems  Constitutional: Positive for weight loss.    Cardiovascular: Negative for chest pain and claudication.  Gastrointestinal: Negative for nausea and vomiting.  Musculoskeletal: Negative for myalgias.       Negative for muscle weakness  Endo/Heme/Allergies:       Negative for hypoglycemia.     PHYSICAL EXAM: Blood pressure 125/86, pulse 99, temperature 98 F (36.7 C), temperature source Oral, height 5\' 3"  (1.6 m), weight 258 lb (117 kg), last menstrual period 06/17/2018, SpO2 99 %. Body mass index is 45.7 kg/m. Physical Exam  Constitutional: She is oriented to person, place, and time. She appears well-developed and well-nourished.  HENT:  Head: Normocephalic.  Neck: Normal range of motion.  Cardiovascular: Normal rate.  Pulmonary/Chest: Effort normal.  Musculoskeletal: Normal range of motion.  Neurological: She is alert and oriented to person, place, and time.  Skin: Skin is warm and dry.  Psychiatric: She has a normal mood and affect. Her behavior is normal.  Vitals reviewed.   RECENT LABS AND TESTS: BMET    Component Value Date/Time   NA 138 06/12/2018 0808   K 4.2 06/12/2018 0808   CL 103 06/12/2018 0808   CO2 19 (L) 06/12/2018 0808   GLUCOSE 116 (H) 06/12/2018 0808   GLUCOSE 150 (H) 08/08/2017 0348   BUN 17 06/12/2018 0808   CREATININE 0.89 06/12/2018 0808   CALCIUM 8.9 06/12/2018 0808   GFRNONAA 82 06/12/2018 0808   GFRAA 95 06/12/2018 0808   Lab Results  Component Value Date   HGBA1C 6.2 (H) 06/12/2018   HGBA1C 7.1 (H) 08/01/2017   HGBA1C 7.4 (H) 07/25/2017   HGBA1C 7.4 (H) 07/24/2017   Lab Results  Component Value Date   INSULIN 16.3 06/12/2018   CBC    Component Value Date/Time   WBC 6.8 09/14/2017 0949   WBC 9.6 08/08/2017 0348   RBC 5.08 09/14/2017 0949   RBC 2.89 (L) 08/08/2017 0348   HGB 13.3 09/14/2017 0949   HCT 39.9 09/14/2017 0949   PLT 320 09/14/2017 0949   MCV 79 09/14/2017 0949   MCH 26.2 (L) 09/14/2017 0949   MCH 27.3 08/08/2017 0348   MCHC 33.3 09/14/2017 0949   MCHC 33.3  08/08/2017 0348   RDW 13.8 09/14/2017 0949   LYMPHSABS 1.8 07/24/2017 0518   MONOABS 0.7 07/24/2017 0518   EOSABS 0.1 07/24/2017 0518   BASOSABS 0.0 07/24/2017 0518   Iron/TIBC/Ferritin/ %Sat No results found for: IRON, TIBC, FERRITIN, IRONPCTSAT Lipid Panel     Component Value Date/Time   CHOL 112 06/12/2018 0808   TRIG 66 06/12/2018 0808   HDL 37 (L) 06/12/2018 0808   CHOLHDL 2.7 03/16/2018 0936   CHOLHDL 3.6 07/26/2017 0250   VLDL 25 07/26/2017 0250   LDLCALC 62 06/12/2018 0808   Hepatic Function Panel     Component Value Date/Time   PROT 6.6 06/12/2018 0808   ALBUMIN 3.7 06/12/2018 0808   AST 13 06/12/2018 0808   ALT 13 06/12/2018 0808   ALKPHOS 81 06/12/2018 0808   BILITOT 0.5 06/12/2018 0808   BILIDIR 0.14 03/16/2018 0936      Component Value Date/Time   TSH 1.560 06/12/2018 0808    Ref. Range 06/12/2018 08:08  Vitamin D, 25-Hydroxy Latest Ref Range: 30.0 - 100.0 ng/mL 25.7 (L)    ASSESSMENT AND PLAN: Type 2 diabetes mellitus without complication, without long-term current use of insulin (HCC)  Vitamin D deficiency - Plan: Vitamin D, Ergocalciferol, (DRISDOL) 50000 units CAPS capsule  Other hyperlipidemia  At risk for osteoporosis  Class 3 severe obesity with serious comorbidity and body mass index (BMI) of 45.0 to 49.9 in adult, unspecified obesity type ([place])  PLAN: Diabetes II [name] has been given extensive diabetes education by myself today including ideal fasting and post-prandial blood glucose readings, individual ideal HgA1c goals  and hypoglycemia prevention. We discussed the importance of good blood sugar control to decrease the likelihood of diabetic complications such as nephropathy, neuropathy, limb loss, blindness, coronary artery disease, and death. We discussed the importance of intensive lifestyle modification including diet, exercise and weight loss as the first line treatment for diabetes. [name] agrees to continue her diabetes medications  and will follow up at the agreed upon  time.  Vitamin D Deficiency [name] was informed that low vitamin D levels contributes to fatigue and are associated with obesity, breast, and colon cancer. She agrees to start to take prescription Vit D @50 ,000 IU every week #4 with no refills and will follow up for routine testing of vitamin D, at least 2-3 times per year. She was informed of the risk of over-replacement of vitamin D and agrees to not increase her dose unless she discusses this with [name] first. Agrees to follow up with our clinic as directed.   Hyperlipidemia [name] was informed of the American Heart Association Guidelines emphasizing intensive lifestyle modifications as the first line treatment for hyperlipidemia. We discussed many lifestyle modifications today in depth, and [name] will continue to work on decreasing saturated fats such as fatty red meat, butter and many fried foods. She will also increase vegetables and lean protein in her diet and continue to work on exercise and weight loss efforts. She agrees to continue Atorvastatin and increase exercise to increase HDL and decrease carbohydrates.   At risk for osteopenia and osteoporosis [name] was given extended  (15 minutes) osteoporosis prevention counseling today. [name] is at risk for osteopenia and osteoporosis due to her vitamin D deficiency. She was encouraged to take her vitamin D and follow her higher calcium diet and increase strengthening exercise to help strengthen her bones and decrease her risk of osteopenia and osteoporosis.  Obesity [name] is currently in the action stage of change. As such, her goal is to continue with weight loss efforts She has agreed to follow the Category 2 plan +100 calories Discussed increasing water to at least 64 ounces daily.  [name] has been instructed to work up to a goal of 150 minutes of combined cardio and strengthening exercise per week or will go back to the gym, 2-3 times a week  for 30 minutes for weight loss and overall health benefits. We discussed the following Behavioral Modification Strategies today: increasing lean protein intake, decreasing simple carbohydrates, increasing water intake, keeping health foods in the home,  and increasing vegetables   [name] has agreed to follow up with our clinic in 2 weeks. She was informed of the importance of frequent follow up visits to maximize her success with intensive lifestyle modifications for her multiple health conditions.   OBESITY BEHAVIORAL INTERVENTION VISIT  Today's visit was # 2   Starting weight: 260 lb Starting date: 06/08/18 Today's weight : 258 lb Today's date: 06/26/18 Total lbs lost to date: 2 lb    ASK: We discussed the diagnosis of obesity with [name] today and [name] agreed to give [name] permission to discuss obesity behavioral modification therapy today.  ASSESS: [name] has the diagnosis of obesity and her BMI today is 45.71 [name] is in the action stage of change   ADVISE: [name] was educated on the multiple health risks of obesity as well as the benefit of weight loss to improve her health. She was advised of the need for long term treatment and the importance of lifestyle modifications to improve her current health and to decrease her risk of future health problems.  AGREE: Multiple dietary modification options and treatment options were discussed and  [name] agreed to follow the recommendations documented in the above note.  ARRANGE: [name] was educated on the importance of frequent visits to treat obesity as outlined per CMS and USPSTF guidelines and agreed to schedule her next follow up appointment today.  Leary Roca, am acting as transcriptionist for  Jearld Lesch, DO   I have reviewed the above documentation for accuracy and completeness, and I agree with the above. -Jearld Lesch, DO

## 2018-06-28 ENCOUNTER — Telehealth (INDEPENDENT_AMBULATORY_CARE_PROVIDER_SITE_OTHER): Payer: Self-pay

## 2018-06-28 ENCOUNTER — Encounter (INDEPENDENT_AMBULATORY_CARE_PROVIDER_SITE_OTHER): Payer: Self-pay

## 2018-06-28 NOTE — Telephone Encounter (Signed)
Sent the pt a mychart message. April, CMA

## 2018-06-28 NOTE — Telephone Encounter (Signed)
Pt does not understand her Rx for Vitamin D.  She said she was given for pills, but directions say to take for a week. Please call patient at [phone]

## 2018-07-10 ENCOUNTER — Ambulatory Visit (INDEPENDENT_AMBULATORY_CARE_PROVIDER_SITE_OTHER): Payer: BC Managed Care – PPO | Admitting: Bariatrics

## 2018-07-10 ENCOUNTER — Encounter (INDEPENDENT_AMBULATORY_CARE_PROVIDER_SITE_OTHER): Payer: Self-pay | Admitting: Bariatrics

## 2018-07-10 VITALS — BP 125/84 | HR 71 | Temp 98.1°F | Ht 63.0 in | Wt 256.0 lb

## 2018-07-10 DIAGNOSIS — E559 Vitamin D deficiency, unspecified: Secondary | ICD-10-CM

## 2018-07-10 DIAGNOSIS — Z6841 Body Mass Index (BMI) 40.0 and over, adult: Secondary | ICD-10-CM

## 2018-07-10 DIAGNOSIS — E119 Type 2 diabetes mellitus without complications: Secondary | ICD-10-CM

## 2018-07-13 DIAGNOSIS — Z6841 Body Mass Index (BMI) 40.0 and over, adult: Secondary | ICD-10-CM | POA: Insufficient documentation

## 2018-07-13 NOTE — Progress Notes (Signed)
Office: [phone]  /  Fax: [phone]   HPI:   Chief Complaint: OBESITY [name] is here to discuss her progress with her obesity treatment plan. She is on the Category 2 plan  + 100 calories and is following her eating plan approximately 65 % of the time. She states she is doing Zumba 40 minutes 3 times per week. [name] has a history of laparoscopic gastric bypass surgery in 12/09 with a slight restriction. She is "doing ok most days" and "hunger controlled". She is having some cravings for peanut butter.  Her weight is 256 lb (116.1 kg) today and has had a weight loss of 2 pounds over a period of 2 weeks since her last visit. She has lost 4 lbs since starting treatment with [name].  Diabetes II [name] has a diagnosis of diabetes type II. [name] states that her fasting BGs are in the 120's. She is taking Trulicity 1.5mg  and metformin 500mg . Her last A1c was 6.2 on 06/12/18. She sees her endocrinologist every 4 months and has an appointment on 07/13/18. She has been working on intensive lifestyle modifications including diet, exercise, and weight loss to help control her blood glucose levels.  Vitamin D deficiency [name] has a diagnosis of vitamin D deficiency. She is currently taking high dose vit D and denies nausea, vomiting or muscle weakness.  ALLERGIES: No Known Allergies  MEDICATIONS: Current Outpatient Medications on File Prior to Visit  Medication Sig Dispense Refill  . acetaminophen (TYLENOL) 500 MG tablet Take 500 mg by mouth every 6 (six) hours as needed for mild pain or headache.    Marland Kitchen aspirin EC 81 MG EC tablet Take 1 tablet (81 mg total) daily by mouth.    Marland Kitchen atorvastatin (LIPITOR) 40 MG tablet Take 1 tablet (40 mg total) by mouth daily. 90 tablet 3  . clopidogrel (PLAVIX) 75 MG tablet Take 1 tablet (75 mg total) by mouth daily. 90 tablet 2  . Coenzyme Q10 (COQ10) 100 MG CAPS Take 100 mg by mouth daily.    . colchicine 0.6 MG tablet Take two (2) tablets by mouth at time  of initial pain. Then take one (1) tablet by mouth twice daily as needed for gout.    . Dulaglutide (TRULICITY) 1.5 MV/7.8IO SOPN Inject 1 Dose into the skin once a week.    . metFORMIN (GLUCOPHAGE) 500 MG tablet Take 500 mg by mouth 2 (two) times daily with a meal.    . metoprolol tartrate (LOPRESSOR) 25 MG tablet Take 1 tablet (25 mg total) by mouth 2 (two) times daily. 180 tablet 2  . Misc Natural Products (DAILY HERBS BONE/JOINTS PO) Take 3-5 tablets daily by mouth. Insta flex    . olmesartan (BENICAR) 5 MG tablet Take 1 tablet (5 mg total) by mouth daily. 90 tablet 3  . Vitamin D, Ergocalciferol, (DRISDOL) 50000 units CAPS capsule Take 1 capsule (50,000 Units total) by mouth every 7 (seven) days. 4 capsule 0   No current facility-administered medications on file prior to visit.     PAST MEDICAL HISTORY: Past Medical History:  Diagnosis Date  . Anxiety   . Back pain   . Chest pain   . Coronary artery disease   . Diabetes mellitus   . Difficulty swallowing pills   . Fatigue   . GERD (gastroesophageal reflux disease)   . Hay fever   . Headache   . Heartburn   . History of heart attack   . HTN (hypertension)   . Hyperlipidemia   .  Joint pain   . Knee pain   . Nervousness   . Palpitations   . Shortness of breath   . Shortness of breath on exertion   . Stress     PAST SURGICAL HISTORY: Past Surgical History:  Procedure Laterality Date  . CORONARY ARTERY BYPASS GRAFT N/A 08/02/2017   Procedure: CORONARY ARTERY BYPASS GRAFTING (CABG) X 5 (LIMA to LAD, LEFT RADIAL ARTERY to DIAGONAL, SVG to SEQUENTIALLY PDA and PLB) , USING LEFT INTERNAL MAMMARY ARTERY, LEFT RADIAL ARTERY, AND  SAPHENOUS VEIN to [place], RIGHT GREATER SAPHENOUS VEIN HARVESTED ENDOSCOPICALLY;  Surgeon: Grace Isaac, MD;  Location: [facility];  Service: Open Heart Surgery;  Laterality: N/A;  . CORONARY/GRAFT ACUTE MI REVASCULARIZATION N/A 07/24/2017   Procedure: Coronary/Graft Acute MI Revascularization;   Surgeon: Belva Crome, MD;  Location: [facility];  Service: Cardiovascular;  Laterality: N/A;  . LAPAROSCOPIC GASTRIC BANDING  09/02/2008  . LEFT HEART CATH AND CORONARY ANGIOGRAPHY N/A 07/24/2017   Procedure: LEFT HEART CATH AND CORONARY ANGIOGRAPHY;  Surgeon: Belva Crome, MD;  Location: [facility];  Service: Cardiovascular;  Laterality: N/A;  . RADIAL ARTERY HARVEST Left 08/02/2017   Procedure: LEFT RADIAL ARTERY HARVEST;  Surgeon: Grace Isaac, MD;  Location: [facility];  Service: Open Heart Surgery;  Laterality: Left;  . STERNAL CLOSURE N/A 08/02/2017   Procedure: STERNAL PLATING;  Surgeon: Grace Isaac, MD;  Location: [facility];  Service: Open Heart Surgery;  Laterality: N/A;  . [place]   had metal removed from stomach as a child  . TEE WITHOUT CARDIOVERSION N/A 08/02/2017   Procedure: TRANSESOPHAGEAL ECHOCARDIOGRAM (TEE);  Surgeon: Grace Isaac, MD;  Location: [facility];  Service: Open Heart Surgery;  Laterality: N/A;    SOCIAL HISTORY: Social History   Tobacco Use  . Smoking status: Never Smoker  . Smokeless tobacco: Never Used  Substance Use Topics  . Alcohol use: Yes    Comment: occ  . Drug use: No    FAMILY HISTORY: Family History  Problem Relation Age of Onset  . Diabetes Father   . Hypertension Father   . Heart disease Father   . Sudden death Father   . Hyperlipidemia Mother   . Hypertension Mother   . Obesity Mother   . Cancer Maternal Grandmother        lung  . Cancer Maternal Grandfather        prostate    ROS: Review of Systems  Constitutional: Positive for weight loss.  Gastrointestinal: Negative for nausea and vomiting.  Musculoskeletal:       Negative for muscle weakness.    PHYSICAL EXAM: Blood pressure 125/84, pulse 71, temperature 98.1 F (36.7 C), temperature source Oral, height 5\' 3"  (1.6 m), weight 256 lb (116.1 kg), last menstrual period 06/17/2018, SpO2 100 %. Body mass index is 45.35  kg/m. Physical Exam  Constitutional: She is oriented to person, place, and time. She appears well-developed and well-nourished.  Cardiovascular: Normal rate.  Pulmonary/Chest: Effort normal.  Musculoskeletal: Normal range of motion.  Neurological: She is oriented to person, place, and time.  Skin: Skin is warm and dry.  Psychiatric: She has a normal mood and affect. Her behavior is normal.  Vitals reviewed.   RECENT LABS AND TESTS: BMET    Component Value Date/Time   NA 138 06/12/2018 0808   K 4.2 06/12/2018 0808   CL 103 06/12/2018 0808   CO2 19 (L) 06/12/2018 9518  GLUCOSE 116 (H) 06/12/2018 0808   GLUCOSE 150 (H) 08/08/2017 0348   BUN 17 06/12/2018 0808   CREATININE 0.89 06/12/2018 0808   CALCIUM 8.9 06/12/2018 0808   GFRNONAA 82 06/12/2018 0808   GFRAA 95 06/12/2018 0808   Lab Results  Component Value Date   HGBA1C 6.2 (H) 06/12/2018   HGBA1C 7.1 (H) 08/01/2017   HGBA1C 7.4 (H) 07/25/2017   HGBA1C 7.4 (H) 07/24/2017   Lab Results  Component Value Date   INSULIN 16.3 06/12/2018   CBC    Component Value Date/Time   WBC 6.8 09/14/2017 0949   WBC 9.6 08/08/2017 0348   RBC 5.08 09/14/2017 0949   RBC 2.89 (L) 08/08/2017 0348   HGB 13.3 09/14/2017 0949   HCT 39.9 09/14/2017 0949   PLT 320 09/14/2017 0949   MCV 79 09/14/2017 0949   MCH 26.2 (L) 09/14/2017 0949   MCH 27.3 08/08/2017 0348   MCHC 33.3 09/14/2017 0949   MCHC 33.3 08/08/2017 0348   RDW 13.8 09/14/2017 0949   LYMPHSABS 1.8 07/24/2017 0518   MONOABS 0.7 07/24/2017 0518   EOSABS 0.1 07/24/2017 0518   BASOSABS 0.0 07/24/2017 0518   Iron/TIBC/Ferritin/ %Sat No results found for: IRON, TIBC, FERRITIN, IRONPCTSAT Lipid Panel     Component Value Date/Time   CHOL 112 06/12/2018 0808   TRIG 66 06/12/2018 0808   HDL 37 (L) 06/12/2018 0808   CHOLHDL 2.7 03/16/2018 0936   CHOLHDL 3.6 07/26/2017 0250   VLDL 25 07/26/2017 0250   LDLCALC 62 06/12/2018 0808   Hepatic Function Panel     Component  Value Date/Time   PROT 6.6 06/12/2018 0808   ALBUMIN 3.7 06/12/2018 0808   AST 13 06/12/2018 0808   ALT 13 06/12/2018 0808   ALKPHOS 81 06/12/2018 0808   BILITOT 0.5 06/12/2018 0808   BILIDIR 0.14 03/16/2018 0936      Component Value Date/Time   TSH 1.560 06/12/2018 0808   Results for DAYAN, KREIS (MRN [id number]) as of 07/13/2018 12:08  Ref. Range 06/12/2018 08:08  Vitamin D, 25-Hydroxy Latest Ref Range: 30.0 - 100.0 ng/mL 25.7 (L)   ASSESSMENT AND PLAN: Type 2 diabetes mellitus without complication, without long-term current use of insulin (HCC)  Vitamin D deficiency  Class 3 severe obesity with serious comorbidity and body mass index (BMI) of 45.0 to 49.9 in adult, unspecified obesity type ([place])  PLAN:  Diabetes II [name] has been given extensive diabetes education by myself today including ideal fasting and post-prandial blood glucose readings, individual ideal Hgb A1c goals, and hypoglycemia prevention. We discussed the importance of good blood sugar control to decrease the likelihood of diabetic complications such as nephropathy, neuropathy, limb loss, blindness, coronary artery disease, and death. We discussed the importance of intensive lifestyle modification including diet, exercise and weight loss as the first line treatment for diabetes. Georgeanne agrees to continue her metformin and Trulicity and will follow up with her endocrinologist at her next appointment. She agrees to follow up with [name] in 2 weeks.  Vitamin D Deficiency [name] was informed that low vitamin D levels contributes to fatigue and are associated with obesity, breast, and colon cancer. She agrees to continue to take prescription Vit D @50 ,000 IU every week and will follow up for routine testing of vitamin D, at least 2-3 times per year. She was informed of the risk of over-replacement of vitamin D and agrees to not increase her dose unless she discusses this with [name] first. [name] agrees to follow  up at  the agreed upon time.  I spent > than 50% of the 15 minute visit on counseling as documented in the note.  Obesity [name] is currently in the action stage of change. As such, her goal is to continue with weight loss efforts. She has agreed to follow the Category 2 plan + 100 calories. [name] agrees to consider seeing Dr. Lucia Gaskins, her weight loss surgeon at [facility]. She will give the office a call. [name] has been instructed to work up to a goal of 150 minutes of combined cardio and strengthening exercise per week for weight loss and overall health benefits. We discussed the following Behavioral Modification Strategies today: increasing lean protein intake, decreasing simple carbohydrates, increasing vegetables, increasing H2O intake and no skipping meals.  [name] has agreed to follow up with our clinic in 2 weeks. She was informed of the importance of frequent follow up visits to maximize her success with intensive lifestyle modifications for her multiple health conditions.   OBESITY BEHAVIORAL INTERVENTION VISIT  Today's visit was # 3   Starting weight: 260 lbs Starting date: 06/08/18 Today's weight : Weight: 256 lb (116.1 kg)  Today's date: 07/10/2018 Total lbs lost to date: 4  ASK: We discussed the diagnosis of obesity with [name] today and [name] agreed to give [name] permission to discuss obesity behavioral modification therapy today.  ASSESS: [name] has the diagnosis of obesity and her BMI today is 45.36. [name] is in the action stage of change.   ADVISE: [name] was educated on the multiple health risks of obesity as well as the benefit of weight loss to improve her health. She was advised of the need for long term treatment and the importance of lifestyle modifications to improve her current health and to decrease her risk of future health problems.  AGREE: Multiple dietary modification options and treatment options were discussed and [name]  agreed to follow the recommendations documented in the above note.  ARRANGE: [name] was educated on the importance of frequent visits to treat obesity as outlined per CMS and USPSTF guidelines and agreed to schedule her next follow up appointment today.  I, Marcille Blanco, am acting as Location manager for [employer]. Owens Shark, DO  I have reviewed the above documentation for accuracy and completeness, and I agree with the above. -Jearld Lesch, DO

## 2018-07-17 ENCOUNTER — Ambulatory Visit (INDEPENDENT_AMBULATORY_CARE_PROVIDER_SITE_OTHER): Payer: BC Managed Care – PPO | Admitting: Psychology

## 2018-07-17 DIAGNOSIS — F3289 Other specified depressive episodes: Secondary | ICD-10-CM | POA: Diagnosis not present

## 2018-07-17 NOTE — Progress Notes (Signed)
Office: [phone]  /  Fax: [phone] Date: July 17, 2018 Time Seen: 3:00pm Duration: 60 minutes Provider: Glennie Isle, PsyD Type of Session: Intake for Individual Therapy   Informed Consent:The provider's role was explained to [employer]. The provider reviewed and discussed issues of confidentiality, privacy, and limits therein. In addition to verbal informed consent, written informed consent for psychological services was obtained from [facility] prior to the initial intake interview. Written consent included information concerning the practice, financial arrangements, and confidentiality and patients' rights. Since the clinic is not a 24/7 crisis center, mental health emergency resources were shared and a handout was provided. The provider further explained the utilization of MyChart, e-mail, voicemail, and/or other messaging systems can be utilized for non-emergency reasons. Deneen verbally acknowledged understanding of the aforementioned, and agreed to use mental health emergency resources discussed if needed. Moreover, Sulma agreed information may be shared with other [facility] providers as needed for coordination of care, and written consent was obtained.   Chief Complaint: Bonna was referred by Dr. Jearld Lesch due to depression with emotional eating behaviors. Per the note for the initial visit with Dr. Jearld Lesch on June 08, 2018, "Pinky is struggling with emotional eating and using food for comfort to the extent that it is negatively impacting her health. She often snacks when she is not hungry. Gigi sometimes feels she is out of control and then feels guilty that she made poor food choices. She is attempting to work on behavior modification techniques to help reduce her emotional eating. She shows no sign of suicidal or homicidal ideations." Ligaya's Food and Mood (modified PHQ-9) score was 16.  Taniah reported engaging in emotional  eating when she is overwhelmed and stressed. She also reported engaging in overeating and noted the last time was yesterday during her homecoming celebration. She noted the last episode of emotional eating was approximately three weeks ago secondary to work stress Ricky indicated she consumed chips and candy corn. In addition, she described craving chips, granola bars, and candy. She also noted grazing behaviors.  Kayli was asked to complete a questionnaire assessing various behaviors related to emotional eating. Genese endorsed the following: overeat when you are celebrating, experience food cravings on a regular basis, eat certain foods when you are anxious, stressed, depressed, or your feelings are hurt, use food to help you cope with emotional situations, find food is comforting to you, overeat when you are angry or upset, overeat when you are worried about something, overeat frequently when you are bored or lonely, overeat when you are alone, but eat much less when you are with other people and eat as a reward.  HPI: Per the note for the initial visit with Dr. Jearld Lesch on June 08, 2018, Tesha  has been heavy most of her life and she started gaining weight at age 14. Her heaviest weight ever was 347 pounds. During the initial appointment with Dr. Owens Shark, [facility] reported experiencing the following: significant food cravings issues; snacking frequently in the evenings; skipping meals frequently; frequently drinking liquids with calories; frequently making poor food choices; problems with excessive hunger; and struggling with emotional eating. In addition, the noted indicated Leilyn had an Adjustable Gastric Banding (Lap Band) placed around 2008 and 2009. The note included, "Her heaviest weight was at 347 pounds and her lowest weight was at 220 pounds. Berania feels like she has minimal resistance and her last adjustment was five to seven years ago." During today's appointment, Onna discussed  emotional and overeating eating behaviors starting in childhood. She denied a history of binge eating. Latamara also denied engagement in purging and other compensatory strategies. She has never been diagnosed with an eating disorder.  Mental Status Examination: Daysy arrived on time for the appointment. She presented as appropriately dressed and groomed. Ethelmae appeared her stated age and demonstrated adequate orientation to time, place, person, and purpose of the appointment. She also demonstrated appropriate eye contact. No psychomotor abnormalities or behavioral peculiarities noted. Her mood was euthymic with congruent affect. Her thought processes were logical, linear, and goal-directed. No hallucinations, delusions, bizarre thinking or behavior reported or observed. Judgment, insight, and impulse control appeared to be grossly intact. There was no evidence of paraphasias (i.e., errors in speech, gross mispronunciations, and word substitutions), repetition deficits, or disturbances in volume or prosody (i.e., rhythm and intonation). There was no evidence of attention or memory impairments. Madelaine denied current suicidal and homicidal ideation, plan, and intent.   The Mini-Mental State Examination, Second Edition (MMSE-2) was administered. The MMSE-2 briefly screens for cognitive dysfunction and overall mental status and assesses different cognitive domains: orientation, registration, attention and calculation, recall, and language and praxis. Emmy received 30 out of 30 points possible on the MMSE-2, which is noted in the normal range.   Family & Psychosocial History: Brinkley shared she has been in a relationship for the past four and a half years and they are getting married on August 12, 2018. She does not have any children. 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 Her highest degree attained is a  master's degree. Jakaila indicated her social support system consists of her fianc, mom, maternal aunt, and friends. 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 Anxiety   . Back pain   . Chest pain   . Coronary artery disease   . Diabetes mellitus   . Difficulty swallowing pills   . Fatigue   . GERD (gastroesophageal reflux disease)   . Hay fever   . Headache   . Heartburn   . History of heart attack   . HTN (hypertension)   . Hyperlipidemia   . Joint pain   . Knee pain   . Nervousness   . Palpitations   . Shortness of breath   . Shortness of breath on exertion   . Stress    Past Surgical History:  Procedure Laterality Date  . CORONARY ARTERY BYPASS GRAFT N/A 08/02/2017   Procedure: CORONARY ARTERY BYPASS GRAFTING (CABG) X 5 (LIMA to LAD, LEFT RADIAL ARTERY to DIAGONAL, SVG to SEQUENTIALLY PDA and PLB) , USING LEFT INTERNAL MAMMARY ARTERY, LEFT RADIAL ARTERY, AND  SAPHENOUS VEIN to [place], RIGHT GREATER SAPHENOUS VEIN HARVESTED ENDOSCOPICALLY;  Surgeon: Grace Isaac, MD;  Location: [facility];  Service: Open Heart Surgery;  Laterality: N/A;  . CORONARY/GRAFT ACUTE MI REVASCULARIZATION N/A 07/24/2017   Procedure: Coronary/Graft Acute MI Revascularization;  Surgeon: Belva Crome, MD;  Location: [place] [facility];  Service: Cardiovascular;  Laterality: N/A;  . LAPAROSCOPIC GASTRIC BANDING  09/02/2008  . LEFT HEART CATH AND CORONARY ANGIOGRAPHY N/A 07/24/2017   Procedure: LEFT HEART CATH AND CORONARY ANGIOGRAPHY;  Surgeon: Belva Crome, MD;  Location: [place] [facility];  Service: Cardiovascular;  Laterality: N/A;  . RADIAL ARTERY HARVEST Left 08/02/2017   Procedure: LEFT RADIAL  ARTERY HARVEST;  Surgeon: Grace Isaac, MD;  Location: [place];  Service: Open Heart Surgery;  Laterality: Left;  . STERNAL CLOSURE N/A 08/02/2017   Procedure: STERNAL PLATING;  Surgeon: Grace Isaac,  MD;  Location: [place];  Service: Open Heart Surgery;  Laterality: N/A;  . Urie   had metal removed from stomach as a child  . TEE WITHOUT CARDIOVERSION N/A 08/02/2017   Procedure: TRANSESOPHAGEAL ECHOCARDIOGRAM (TEE);  Surgeon: Grace Isaac, MD;  Location: [place];  Service: Open Heart Surgery;  Laterality: N/A;   Current Outpatient Medications on File Prior to Visit  Medication Sig Dispense Refill  . acetaminophen (TYLENOL) 500 MG tablet Take 500 mg by mouth every 6 (six) hours as needed for mild pain or headache.    Marland Kitchen aspirin EC 81 MG EC tablet Take 1 tablet (81 mg total) daily by mouth.    Marland Kitchen atorvastatin (LIPITOR) 40 MG tablet Take 1 tablet (40 mg total) by mouth daily. 90 tablet 3  . clopidogrel (PLAVIX) 75 MG tablet Take 1 tablet (75 mg total) by mouth daily. 90 tablet 2  . Coenzyme Q10 (COQ10) 100 MG CAPS Take 100 mg by mouth daily.    . colchicine 0.6 MG tablet Take two (2) tablets by mouth at time of initial pain. Then take one (1) tablet by mouth twice daily as needed for gout.    . Dulaglutide (TRULICITY) 1.5 WU/9.8JX SOPN Inject 1 Dose into the skin once a week.    . metFORMIN (GLUCOPHAGE) 500 MG tablet Take 500 mg by mouth 2 (two) times daily with a meal.    . metoprolol tartrate (LOPRESSOR) 25 MG tablet Take 1 tablet (25 mg total) by mouth 2 (two) times daily. 180 tablet 2  . Misc Natural Products (DAILY HERBS BONE/JOINTS PO) Take 3-5 tablets daily by mouth. Insta flex    . olmesartan (BENICAR) 5 MG tablet Take 1 tablet (5 mg total) by mouth daily. 90 tablet 3  . Vitamin D, Ergocalciferol, (DRISDOL) 50000 units CAPS capsule Take 1 capsule (50,000 Units total) by mouth every 7 (seven) days. 4 capsule 0   No current facility-administered medications on file prior to visit.   Kerianna denied a history of head injuries and loss of consciousness.   Mental Health History: Aleli first received therapeutic services around 2008 when she had to complete an evaluation  for bariatric surgery.  She denied ever meeting with a psychiatrist and has never been hospitalized for psychiatric reasons. She also denied a family history of mental health concerns. Regarding trauma, Katherin denied a history of sexual, physical, and psychological abuse as well as neglect. However, she indicated her father passed away unexpectedly approximately 21 years ago due to a heart attack. She discussed being in shock as it was unexpected.  While Merle did not endorse symptoms on the PHQ-9 and GAD-7, she requested to discuss the various items as she stated she experiences some of the items, but less than several days. Nastassia reported experiencing the following: decreased energy and worry thoughts related to work, health, and loved ones. As it relates to emotional eating, she described feeling guilt, shame, and regret. Kryslyn denied experiencing the following: depressed mood; hopelessness; attention and concentration issues; memory concerns; obsessions and compulsions; mania; sleep difficulties; appetite concerns; hallucinations and delusions; angry outbursts; crying spells; history of and current engagement in self-harm; and history of and current suicidal and homicidal ideation, plan, and intent. Marylyn denied substance use, but noted occasional  alcohol use "maybe once every four months." When she consumes alcohol, she noted it is a standard alcoholic beverage.   Structured Assessment Results: The Patient Health Questionnaire-9 (PHQ-9) is a self-report measure that assesses symptoms and severity of depression over the course of the last two weeks. Edelyn obtained a score of zero. Depression screen [facility] 2/9 07/17/2018  Decreased Interest 0  Down, Depressed, Hopeless 0  PHQ - 2 Score 0  Altered sleeping 0  Tired, decreased energy 0  Change in appetite 0  Feeling bad or failure about yourself  0  Trouble concentrating 0  Moving slowly or fidgety/restless 0  Suicidal thoughts 0  PHQ-9  Score 0  Difficult doing work/chores -   The Generalized Anxiety Disorder-7 (GAD-7) is a brief self-report measure that assesses symptoms of anxiety over the course of the last two weeks. Arnetra obtained a score of zero. GAD 7 : Generalized Anxiety Score 07/17/2018  Nervous, Anxious, on Edge 0  Control/stop worrying 0  Worry too much - different things 0  Trouble relaxing 0  Restless 0  Easily annoyed or irritable 0  Afraid - awful might happen 0  Total GAD 7 Score 0   Interventions: A chart review was conducted prior to the clinical intake interview. The MMSE-2, PHQ-9, and GAD-7 were administered and a clinical intake interview was completed. In addition, Evita was asked to complete a Mood and Food questionnaire to assess various behaviors related to emotional eating. Throughout session, empathic reflections and validation was provided. Continuing treatment with this provider was discussed and a treatment goal was established. Psychoeducation regarding emotional versus physical hunger was provided. Damaris was given a handout to utilize between now and the next appointment to increase awareness of hunger patterns and subsequent eating.   Provisional DSM-5 Diagnosis: 311 (F32.8) Other Specified Depressive Disorder, Emotional Eating  Plan: Laiylah expressed understanding and agreement with the initial treatment plan of care. She appears able and willing to participate as evidenced by collaboration on a treatment goal, engagement in reciprocal conversation, and asking questions as needed for clarification. The next appointment will be scheduled in two weeks. The following treatment goal was established: decrease emotional eating. For the aforementioned goal, Kyaira can benefit from biweekly sessions that are brief in duration for approximately four to six sessions.

## 2018-07-18 ENCOUNTER — Other Ambulatory Visit (INDEPENDENT_AMBULATORY_CARE_PROVIDER_SITE_OTHER): Payer: Self-pay | Admitting: Bariatrics

## 2018-07-18 DIAGNOSIS — E559 Vitamin D deficiency, unspecified: Secondary | ICD-10-CM

## 2018-07-24 ENCOUNTER — Ambulatory Visit (INDEPENDENT_AMBULATORY_CARE_PROVIDER_SITE_OTHER): Payer: Self-pay | Admitting: Bariatrics

## 2018-07-31 ENCOUNTER — Ambulatory Visit (INDEPENDENT_AMBULATORY_CARE_PROVIDER_SITE_OTHER): Payer: BC Managed Care – PPO | Admitting: Bariatrics

## 2018-07-31 VITALS — BP 139/77 | HR 69 | Temp 97.9°F | Ht 63.0 in | Wt 255.0 lb

## 2018-07-31 DIAGNOSIS — E119 Type 2 diabetes mellitus without complications: Secondary | ICD-10-CM

## 2018-07-31 DIAGNOSIS — Z6841 Body Mass Index (BMI) 40.0 and over, adult: Secondary | ICD-10-CM

## 2018-07-31 DIAGNOSIS — E559 Vitamin D deficiency, unspecified: Secondary | ICD-10-CM | POA: Diagnosis not present

## 2018-07-31 DIAGNOSIS — F3289 Other specified depressive episodes: Secondary | ICD-10-CM

## 2018-07-31 DIAGNOSIS — Z9189 Other specified personal risk factors, not elsewhere classified: Secondary | ICD-10-CM | POA: Diagnosis not present

## 2018-07-31 MED ORDER — VITAMIN D (ERGOCALCIFEROL) 1.25 MG (50000 UNIT) PO CAPS: 50000.0000 [IU] | ORAL_CAPSULE | ORAL | 0 refills | Status: DC

## 2018-08-01 NOTE — Progress Notes (Signed)
Office: [phone]  /  Fax: [phone]   HPI:   Chief Complaint: OBESITY [name] is here to discuss her progress with her obesity treatment plan. She is on the Category 2 plan and is following her eating plan approximately 45 % of the time. She states she is exercising 0 minutes 0 times per week. [name] has had some struggling with eating more during her home coming. She is under a lot of stress due to getting married in two weeks. She has had some mild stress eating. Her weight is 255 lb (115.7 kg) today and has had a weight loss of 1 pound over a period of 3 weeks since her last visit. She has lost 5 lbs since starting treatment with [name].  Vitamin D deficiency [name] has a diagnosis of vitamin D deficiency. She is currently taking high dose prescription vit D and denies nausea, vomiting or muscle weakness.  At risk for osteopenia and osteoporosis [name] is at higher risk of osteopenia and osteoporosis due to vitamin D deficiency.   Diabetes II [name] has a diagnosis of diabetes type II. She is currently taking Trulicity 1.5 mg and Metformin. She sees her endocrinologist every four months. [name] states fasting BGs range between 120 and 130's and she denies any hypoglycemic episodes. Last A1c was at 6.2 She has been working on intensive lifestyle modifications including diet, exercise, and weight loss to help control her blood glucose levels.  Depression with emotional eating behaviors [name]  struggles with emotional eating and using food for comfort to the extent that it is negatively impacting her health. She has been working on behavior modification techniques to help reduce her emotional eating and has been somewhat successful. She has seen Dr. Mallie Mussel our bariatric psychologist. She shows no sign of suicidal or homicidal ideations.  Depression screen [name] 2/9 07/17/2018 06/08/2018 09/26/2017  Decreased Interest 0 3 0  Down, Depressed, Hopeless 0 1 0  PHQ - 2 Score 0 4 0    Altered sleeping 0 1 -  Tired, decreased energy 0 3 -  Change in appetite 0 2 -  Feeling bad or failure about yourself  0 3 -  Trouble concentrating 0 1 -  Moving slowly or fidgety/restless 0 2 -  Suicidal thoughts 0 0 -  PHQ-9 Score 0 16 -  Difficult doing work/chores - Not difficult at all -     ALLERGIES: No Known Allergies  MEDICATIONS: Current Outpatient Medications on File Prior to Visit  Medication Sig Dispense Refill  . acetaminophen (TYLENOL) 500 MG tablet Take 500 mg by mouth every 6 (six) hours as needed for mild pain or headache.    Marland Kitchen aspirin EC 81 MG EC tablet Take 1 tablet (81 mg total) daily by mouth.    Marland Kitchen atorvastatin (LIPITOR) 40 MG tablet Take 1 tablet (40 mg total) by mouth daily. 90 tablet 3  . clopidogrel (PLAVIX) 75 MG tablet Take 1 tablet (75 mg total) by mouth daily. 90 tablet 2  . Coenzyme Q10 (COQ10) 100 MG CAPS Take 100 mg by mouth daily.    . colchicine 0.6 MG tablet Take two (2) tablets by mouth at time of initial pain. Then take one (1) tablet by mouth twice daily as needed for gout.    . Dulaglutide (TRULICITY) 1.5 GN/5.6OZ SOPN Inject 1 Dose into the skin once a week.    . metFORMIN (GLUCOPHAGE) 500 MG tablet Take 500 mg by mouth 2 (two) times daily with a meal.    .  metoprolol tartrate (LOPRESSOR) 25 MG tablet Take 1 tablet (25 mg total) by mouth 2 (two) times daily. 180 tablet 2  . Misc Natural Products (DAILY HERBS BONE/JOINTS PO) Take 3-5 tablets daily by mouth. Insta flex    . olmesartan (BENICAR) 5 MG tablet Take 1 tablet (5 mg total) by mouth daily. 90 tablet 3   No current facility-administered medications on file prior to visit.     PAST MEDICAL HISTORY: Past Medical History:  Diagnosis Date  . Anxiety   . Back pain   . Chest pain   . Coronary artery disease   . Diabetes mellitus   . Difficulty swallowing pills   . Fatigue   . GERD (gastroesophageal reflux disease)   . Hay fever   . Headache   . Heartburn   . History of heart  attack   . HTN (hypertension)   . Hyperlipidemia   . Joint pain   . Knee pain   . Nervousness   . Palpitations   . Shortness of breath   . Shortness of breath on exertion   . Stress     PAST SURGICAL HISTORY: Past Surgical History:  Procedure Laterality Date  . CORONARY ARTERY BYPASS GRAFT N/A 08/02/2017   Procedure: CORONARY ARTERY BYPASS GRAFTING (CABG) X 5 (LIMA to LAD, LEFT RADIAL ARTERY to DIAGONAL, SVG to SEQUENTIALLY PDA and PLB) , USING LEFT INTERNAL MAMMARY ARTERY, LEFT RADIAL ARTERY, AND  SAPHENOUS VEIN to [place], RIGHT GREATER SAPHENOUS VEIN HARVESTED ENDOSCOPICALLY;  Surgeon: Grace Isaac, MD;  Location: [place];  Service: Open Heart Surgery;  Laterality: N/A;  . CORONARY/GRAFT ACUTE MI REVASCULARIZATION N/A 07/24/2017   Procedure: Coronary/Graft Acute MI Revascularization;  Surgeon: Belva Crome, MD;  Location: [facility];  Service: Cardiovascular;  Laterality: N/A;  . LAPAROSCOPIC GASTRIC BANDING  09/02/2008  . LEFT HEART CATH AND CORONARY ANGIOGRAPHY N/A 07/24/2017   Procedure: LEFT HEART CATH AND CORONARY ANGIOGRAPHY;  Surgeon: Belva Crome, MD;  Location: [facility];  Service: Cardiovascular;  Laterality: N/A;  . RADIAL ARTERY HARVEST Left 08/02/2017   Procedure: LEFT RADIAL ARTERY HARVEST;  Surgeon: Grace Isaac, MD;  Location: [place];  Service: Open Heart Surgery;  Laterality: Left;  . STERNAL CLOSURE N/A 08/02/2017   Procedure: STERNAL PLATING;  Surgeon: Grace Isaac, MD;  Location: [place];  Service: Open Heart Surgery;  Laterality: N/A;  . [place]   had metal removed from stomach as a child  . TEE WITHOUT CARDIOVERSION N/A 08/02/2017   Procedure: TRANSESOPHAGEAL ECHOCARDIOGRAM (TEE);  Surgeon: Grace Isaac, MD;  Location: [place];  Service: Open Heart Surgery;  Laterality: N/A;    SOCIAL HISTORY: Social History   Tobacco Use  . Smoking status: Never Smoker  . Smokeless tobacco: Never Used  Substance Use  Topics  . Alcohol use: Yes    Comment: occ  . Drug use: No    FAMILY HISTORY: Family History  Problem Relation Age of Onset  . Diabetes Father   . Hypertension Father   . Heart disease Father   . Sudden death Father   . Hyperlipidemia Mother   . Hypertension Mother   . Obesity Mother   . Cancer Maternal Grandmother        lung  . Cancer Maternal Grandfather        prostate    ROS: Review of Systems  Constitutional: Positive for weight loss.  Gastrointestinal: Negative for nausea and vomiting.  Musculoskeletal:       Negative for muscle weakness  Endo/Heme/Allergies:       Negative for hypoglycemia  Psychiatric/Behavioral: Positive for depression. Negative for suicidal ideas.       + Stress    PHYSICAL EXAM: Blood pressure 139/77, pulse 69, temperature 97.9 F (36.6 C), temperature source Oral, height 5\' 3"  (1.6 m), weight 255 lb (115.7 kg), SpO2 100 %. Body mass index is 45.17 kg/m. Physical Exam  Constitutional: She is oriented to person, place, and time. She appears well-developed and well-nourished.  Cardiovascular: Normal rate.  Pulmonary/Chest: Effort normal.  Musculoskeletal: Normal range of motion.  Neurological: She is oriented to person, place, and time.  Skin: Skin is warm and dry.  Psychiatric: She has a normal mood and affect. Her behavior is normal. She expresses no homicidal and no suicidal ideation.  Vitals reviewed.   RECENT LABS AND TESTS: BMET    Component Value Date/Time   NA 138 06/12/2018 0808   K 4.2 06/12/2018 0808   CL 103 06/12/2018 0808   CO2 19 (L) 06/12/2018 0808   GLUCOSE 116 (H) 06/12/2018 0808   GLUCOSE 150 (H) 08/08/2017 0348   BUN 17 06/12/2018 0808   CREATININE 0.89 06/12/2018 0808   CALCIUM 8.9 06/12/2018 0808   GFRNONAA 82 06/12/2018 0808   GFRAA 95 06/12/2018 0808   Lab Results  Component Value Date   HGBA1C 6.2 (H) 06/12/2018   HGBA1C 7.1 (H) 08/01/2017   HGBA1C 7.4 (H) 07/25/2017   HGBA1C 7.4 (H) 07/24/2017     Lab Results  Component Value Date   INSULIN 16.3 06/12/2018   CBC    Component Value Date/Time   WBC 6.8 09/14/2017 0949   WBC 9.6 08/08/2017 0348   RBC 5.08 09/14/2017 0949   RBC 2.89 (L) 08/08/2017 0348   HGB 13.3 09/14/2017 0949   HCT 39.9 09/14/2017 0949   PLT 320 09/14/2017 0949   MCV 79 09/14/2017 0949   MCH 26.2 (L) 09/14/2017 0949   MCH 27.3 08/08/2017 0348   MCHC 33.3 09/14/2017 0949   MCHC 33.3 08/08/2017 0348   RDW 13.8 09/14/2017 0949   LYMPHSABS 1.8 07/24/2017 0518   MONOABS 0.7 07/24/2017 0518   EOSABS 0.1 07/24/2017 0518   BASOSABS 0.0 07/24/2017 0518   Iron/TIBC/Ferritin/ %Sat No results found for: IRON, TIBC, FERRITIN, IRONPCTSAT Lipid Panel     Component Value Date/Time   CHOL 112 06/12/2018 0808   TRIG 66 06/12/2018 0808   HDL 37 (L) 06/12/2018 0808   CHOLHDL 2.7 03/16/2018 0936   CHOLHDL 3.6 07/26/2017 0250   VLDL 25 07/26/2017 0250   LDLCALC 62 06/12/2018 0808   Hepatic Function Panel     Component Value Date/Time   PROT 6.6 06/12/2018 0808   ALBUMIN 3.7 06/12/2018 0808   AST 13 06/12/2018 0808   ALT 13 06/12/2018 0808   ALKPHOS 81 06/12/2018 0808   BILITOT 0.5 06/12/2018 0808   BILIDIR 0.14 03/16/2018 0936      Component Value Date/Time   TSH 1.560 06/12/2018 0808   Results for CHONTE, RICKE (MRN [id number]) as of 08/01/2018 16:56  Ref. Range 06/12/2018 08:08  Vitamin D, 25-Hydroxy Latest Ref Range: 30.0 - 100.0 ng/mL 25.7 (L)   ASSESSMENT AND PLAN: Vitamin D deficiency - Plan: Vitamin D, Ergocalciferol, [name]  Type 2 diabetes mellitus without complication, without long-term current use of insulin (HCC)  Other depression - with emotional eating  At risk for osteoporosis  Class 3 severe obesity with serious comorbidity and body mass index (BMI) of 45.0 to 49.9 in adult, unspecified obesity type ([place])  PLAN:  Vitamin D Deficiency Ekta was informed that low vitamin D levels  contributes to fatigue and are associated with obesity, breast, and colon cancer. She agrees to continue to take prescription Vit D @50 ,000 IU every week #4 with no refills and will follow up for routine testing of vitamin D, at least 2-3 times per year. She was informed of the risk of over-replacement of vitamin D and agrees to not increase her dose unless she discusses this with [name] first. Mckensi agrees to follow up as directed.  At risk for osteopenia and osteoporosis [name] was given extended  (15 minutes) osteoporosis prevention counseling today. [name] is at risk for osteopenia and osteoporosis due to her vitamin D deficiency. She was encouraged to take her vitamin D and follow her higher calcium diet and increase strengthening exercise to help strengthen her bones and decrease her risk of osteopenia and osteoporosis.  Diabetes II [name] has been given extensive diabetes education by myself today including ideal fasting and post-prandial blood glucose readings, individual ideal Hgb A1c goals and hypoglycemia prevention. We discussed the importance of good blood sugar control to decrease the likelihood of diabetic complications such as nephropathy, neuropathy, limb loss, blindness, coronary artery disease, and death. We discussed the importance of intensive lifestyle modification including diet, exercise and weight loss as the first line treatment for diabetes. [name] agrees to continue Trulicity and Metformin with no change in dose and will follow up at the agreed upon time.  Depression with Emotional Eating Behaviors We discussed behavior modification techniques today to help [name] deal with her emotional eating and depression. She will continue to follow up with Dr. Mallie Mussel.  Obesity [name] is currently in the action stage of change. As such, her goal is to continue with weight loss efforts She has agreed to follow the Category 2 plan [name] has been instructed to work up to a goal of  150 minutes of combined cardio and strengthening exercise per week for weight loss and overall health benefits. We discussed the following Behavioral Modification Strategies today: increase H2O intake, no skipping meals, decreasing sweets, increasing lean protein intake, decreasing simple carbohydrates , increasing vegetables, decrease eating out and work on meal planning and easy cooking plans  [name] has agreed to follow up with our clinic in 2 weeks. She was informed of the importance of frequent follow up visits to maximize her success with intensive lifestyle modifications for her multiple health conditions.   OBESITY BEHAVIORAL INTERVENTION VISIT  Today's visit was # 4   Starting weight: 260 lbs Starting date: 06/08/18 Today's weight : 255 lbs  Today's date: 07/31/2018 Total lbs lost to date: 5   ASK: We discussed the diagnosis of obesity with [name] today and [name] agreed to give [name] permission to discuss obesity behavioral modification therapy today.  ASSESS: [name] has the diagnosis of obesity and her BMI today is 45.18 [name] is in the action stage of change   ADVISE: [name] was educated on the multiple health risks of obesity as well as the benefit of weight loss to improve her health. She was advised of the need for long term treatment and the importance of lifestyle modifications to improve her current health and to decrease her risk of future health problems.  AGREE: Multiple dietary modification options and treatment options were discussed and  [name] agreed to follow  the recommendations documented in the above note.  ARRANGE: [name] was educated on the importance of frequent visits to treat obesity as outlined per CMS and USPSTF guidelines and agreed to schedule her next follow up appointment today.  Corey Skains, am acting as Location manager for [employer]. Owens Shark, DO  I have reviewed the above documentation for accuracy and completeness, and I  agree with the above. -Jearld Lesch, DO

## 2018-08-02 ENCOUNTER — Ambulatory Visit (INDEPENDENT_AMBULATORY_CARE_PROVIDER_SITE_OTHER): Payer: Self-pay | Admitting: Psychology

## 2018-08-16 ENCOUNTER — Ambulatory Visit (INDEPENDENT_AMBULATORY_CARE_PROVIDER_SITE_OTHER): Payer: BC Managed Care – PPO | Admitting: Bariatrics

## 2018-08-16 ENCOUNTER — Telehealth: Payer: Self-pay | Admitting: Interventional Cardiology

## 2018-08-16 ENCOUNTER — Other Ambulatory Visit: Payer: Self-pay | Admitting: *Deleted

## 2018-08-16 ENCOUNTER — Encounter (INDEPENDENT_AMBULATORY_CARE_PROVIDER_SITE_OTHER): Payer: Self-pay | Admitting: Bariatrics

## 2018-08-16 VITALS — BP 121/82 | HR 76 | Temp 97.6°F | Ht 63.0 in | Wt 253.0 lb

## 2018-08-16 DIAGNOSIS — Z9189 Other specified personal risk factors, not elsewhere classified: Secondary | ICD-10-CM

## 2018-08-16 DIAGNOSIS — Z6841 Body Mass Index (BMI) 40.0 and over, adult: Secondary | ICD-10-CM

## 2018-08-16 DIAGNOSIS — E559 Vitamin D deficiency, unspecified: Secondary | ICD-10-CM

## 2018-08-16 DIAGNOSIS — E119 Type 2 diabetes mellitus without complications: Secondary | ICD-10-CM

## 2018-08-16 MED ORDER — CLOPIDOGREL BISULFATE 75 MG PO TABS: 75.0000 mg | ORAL_TABLET | Freq: Every day | ORAL | 0 refills | Status: DC

## 2018-08-16 MED ORDER — VITAMIN D (ERGOCALCIFEROL) 1.25 MG (50000 UNIT) PO CAPS
50000.0000 [IU] | ORAL_CAPSULE | ORAL | 0 refills | Status: DC
Start: 2018-08-16 — End: 2018-09-27

## 2018-08-16 NOTE — Telephone Encounter (Signed)
° ° ° °*  STAT* If patient is at the pharmacy, call can be transferred to refill team.   1. Which medications need to be refilled? (please list name of each medication and dose if known) clopidogrel (PLAVIX) 75 MG tablet  2. Which pharmacy/location (including street and city if local pharmacy) is medication to be sent to? [facility] [place], [place]  3. Do they need a 30 day or 90 day supply? [place]

## 2018-08-22 NOTE — Progress Notes (Signed)
Office: [phone]  /  Fax: [phone]   HPI:   Chief Complaint: OBESITY [name] is here to discuss her progress with her obesity treatment plan. She is on the  follow the Category 2 plan and is following her eating plan approximately 60 % of the time. She states she is exercising 0 minutes 0 times per week. [name] is doing well on the plan. She has had a lot of change, primarily being a newlywed.   Her weight is 253 lb (114.8 kg) today and has had a weight loss of 3 pounds over a period of 2 weeks since her last visit. She has lost 8 lbs since starting treatment with [name].  Vitamin D deficiency [name] has a diagnosis of vitamin D deficiency. She is currently taking vit D and denies nausea, vomiting or muscle weakness.  Ref. Range 06/12/2018 08:08  Vitamin D, 25-Hydroxy Latest Ref Range: 30.0 - 100.0 ng/mL 25.7 (L)   Diabetes II [name] has a diagnosis of diabetes type II. [name] denies any hypoglycemic episodes. Last A1c was 6.2.  She has been working on intensive lifestyle modifications including diet, exercise, and weight loss to help control her blood glucose levels. She is currently on trulicity and metformin.   At risk for osteopenia and osteoporosis [name] is at higher risk of osteopenia and osteoporosis due to vitamin D deficiency.   ALLERGIES: No Known Allergies  MEDICATIONS: Current Outpatient Medications on File Prior to Visit  Medication Sig Dispense Refill  . acetaminophen (TYLENOL) 500 MG tablet Take 500 mg by mouth every 6 (six) hours as needed for mild pain or headache.    Marland Kitchen aspirin EC 81 MG EC tablet Take 1 tablet (81 mg total) daily by mouth.    Marland Kitchen atorvastatin (LIPITOR) 40 MG tablet Take 1 tablet (40 mg total) by mouth daily. 90 tablet 3  . Coenzyme Q10 (COQ10) 100 MG CAPS Take 100 mg by mouth daily.    . colchicine 0.6 MG tablet Take two (2) tablets by mouth at time of initial pain. Then take one (1) tablet by mouth twice daily as needed for gout.    .  Dulaglutide (TRULICITY) 1.5 BP/1.0CH SOPN Inject 1 Dose into the skin once a week.    . metFORMIN (GLUCOPHAGE) 500 MG tablet Take 500 mg by mouth 2 (two) times daily with a meal.    . metoprolol tartrate (LOPRESSOR) 25 MG tablet Take 1 tablet (25 mg total) by mouth 2 (two) times daily. 180 tablet 2  . Misc Natural Products (DAILY HERBS BONE/JOINTS PO) Take 3-5 tablets daily by mouth. Insta flex    . olmesartan (BENICAR) 5 MG tablet Take 1 tablet (5 mg total) by mouth daily. 90 tablet 3   No current facility-administered medications on file prior to visit.     PAST MEDICAL HISTORY: Past Medical History:  Diagnosis Date  . Anxiety   . Back pain   . Chest pain   . Coronary artery disease   . Diabetes mellitus   . Difficulty swallowing pills   . Fatigue   . GERD (gastroesophageal reflux disease)   . Hay fever   . Headache   . Heartburn   . History of heart attack   . HTN (hypertension)   . Hyperlipidemia   . Joint pain   . Knee pain   . Nervousness   . Palpitations   . Shortness of breath   . Shortness of breath on exertion   . Stress  PAST SURGICAL HISTORY: Past Surgical History:  Procedure Laterality Date  . CORONARY ARTERY BYPASS GRAFT N/A 08/02/2017   Procedure: CORONARY ARTERY BYPASS GRAFTING (CABG) X 5 (LIMA to LAD, LEFT RADIAL ARTERY to DIAGONAL, SVG to SEQUENTIALLY PDA and PLB) , USING LEFT INTERNAL MAMMARY ARTERY, LEFT RADIAL ARTERY, AND  SAPHENOUS VEIN to [place], RIGHT GREATER SAPHENOUS VEIN HARVESTED ENDOSCOPICALLY;  Surgeon: Grace Isaac, MD;  Location: [facility];  Service: Open Heart Surgery;  Laterality: N/A;  . CORONARY/GRAFT ACUTE MI REVASCULARIZATION N/A 07/24/2017   Procedure: Coronary/Graft Acute MI Revascularization;  Surgeon: Belva Crome, MD;  Location: [facility];  Service: Cardiovascular;  Laterality: N/A;  . LAPAROSCOPIC GASTRIC BANDING  09/02/2008  . LEFT HEART CATH AND CORONARY ANGIOGRAPHY N/A 07/24/2017   Procedure: LEFT HEART CATH  AND CORONARY ANGIOGRAPHY;  Surgeon: Belva Crome, MD;  Location: [facility];  Service: Cardiovascular;  Laterality: N/A;  . RADIAL ARTERY HARVEST Left 08/02/2017   Procedure: LEFT RADIAL ARTERY HARVEST;  Surgeon: Grace Isaac, MD;  Location: [facility];  Service: Open Heart Surgery;  Laterality: Left;  . STERNAL CLOSURE N/A 08/02/2017   Procedure: STERNAL PLATING;  Surgeon: Grace Isaac, MD;  Location: [facility];  Service: Open Heart Surgery;  Laterality: N/A;  . [place]   had metal removed from stomach as a child  . TEE WITHOUT CARDIOVERSION N/A 08/02/2017   Procedure: TRANSESOPHAGEAL ECHOCARDIOGRAM (TEE);  Surgeon: Grace Isaac, MD;  Location: [facility];  Service: Open Heart Surgery;  Laterality: N/A;    SOCIAL HISTORY: Social History   Tobacco Use  . Smoking status: Never Smoker  . Smokeless tobacco: Never Used  Substance Use Topics  . Alcohol use: Yes    Comment: occ  . Drug use: No    FAMILY HISTORY: Family History  Problem Relation Age of Onset  . Diabetes Father   . Hypertension Father   . Heart disease Father   . Sudden death Father   . Hyperlipidemia Mother   . Hypertension Mother   . Obesity Mother   . Cancer Maternal Grandmother        lung  . Cancer Maternal Grandfather        prostate    ROS: Review of Systems  Constitutional: Positive for weight loss.  Gastrointestinal: Negative for nausea and vomiting.  Musculoskeletal:       Negative for muscle weakness  Endo/Heme/Allergies:       Negative for hypoglycemia    PHYSICAL EXAM: Blood pressure 121/82, pulse 76, temperature 97.6 F (36.4 C), temperature source Oral, height 5\' 3"  (1.6 m), weight 253 lb (114.8 kg), SpO2 98 %. Body mass index is 44.82 kg/m. Physical Exam  Constitutional: She is oriented to person, place, and time. She appears well-developed and well-nourished.  HENT:  Head: Normocephalic.  Eyes: Pupils are equal, round, and reactive to light.  Neck: Normal  range of motion.  Cardiovascular: Normal rate.  Pulmonary/Chest: Effort normal.  Musculoskeletal: Normal range of motion.  Neurological: She is alert and oriented to person, place, and time.  Skin: Skin is warm and dry.  Psychiatric: She has a normal mood and affect. Her behavior is normal.  Vitals reviewed.   RECENT LABS AND TESTS: BMET    Component Value Date/Time   NA 138 06/12/2018 0808   K 4.2 06/12/2018 0808   CL 103 06/12/2018 0808   CO2 19 (L) 06/12/2018 0808   GLUCOSE 116 (H) 06/12/2018 0998  GLUCOSE 150 (H) 08/08/2017 0348   BUN 17 06/12/2018 0808   CREATININE 0.89 06/12/2018 0808   CALCIUM 8.9 06/12/2018 0808   GFRNONAA 82 06/12/2018 0808   GFRAA 95 06/12/2018 0808   Lab Results  Component Value Date   HGBA1C 6.2 (H) 06/12/2018   HGBA1C 7.1 (H) 08/01/2017   HGBA1C 7.4 (H) 07/25/2017   HGBA1C 7.4 (H) 07/24/2017   Lab Results  Component Value Date   INSULIN 16.3 06/12/2018   CBC    Component Value Date/Time   WBC 6.8 09/14/2017 0949   WBC 9.6 08/08/2017 0348   RBC 5.08 09/14/2017 0949   RBC 2.89 (L) 08/08/2017 0348   HGB 13.3 09/14/2017 0949   HCT 39.9 09/14/2017 0949   PLT 320 09/14/2017 0949   MCV 79 09/14/2017 0949   MCH 26.2 (L) 09/14/2017 0949   MCH 27.3 08/08/2017 0348   MCHC 33.3 09/14/2017 0949   MCHC 33.3 08/08/2017 0348   RDW 13.8 09/14/2017 0949   LYMPHSABS 1.8 07/24/2017 0518   MONOABS 0.7 07/24/2017 0518   EOSABS 0.1 07/24/2017 0518   BASOSABS 0.0 07/24/2017 0518   Iron/TIBC/Ferritin/ %Sat No results found for: IRON, TIBC, FERRITIN, IRONPCTSAT Lipid Panel     Component Value Date/Time   CHOL 112 06/12/2018 0808   TRIG 66 06/12/2018 0808   HDL 37 (L) 06/12/2018 0808   CHOLHDL 2.7 03/16/2018 0936   CHOLHDL 3.6 07/26/2017 0250   VLDL 25 07/26/2017 0250   LDLCALC 62 06/12/2018 0808   Hepatic Function Panel     Component Value Date/Time   PROT 6.6 06/12/2018 0808   ALBUMIN 3.7 06/12/2018 0808   AST 13 06/12/2018 0808   ALT  13 06/12/2018 0808   ALKPHOS 81 06/12/2018 0808   BILITOT 0.5 06/12/2018 0808   BILIDIR 0.14 03/16/2018 0936      Component Value Date/Time   TSH 1.560 06/12/2018 0808    Ref. Range 06/12/2018 08:08  Vitamin D, 25-Hydroxy Latest Ref Range: 30.0 - 100.0 ng/mL 25.7 (L)    ASSESSMENT AND PLAN: Vitamin D deficiency - Plan: Vitamin D, Ergocalciferol, [name]  Type 2 diabetes mellitus without complication, without long-term current use of insulin (HCC)  At risk for osteoporosis  Class 3 severe obesity with serious comorbidity and body mass index (BMI) of 40.0 to 44.9 in adult, unspecified obesity type ([place])  PLAN: Vitamin D Deficiency [name] was informed that low vitamin D levels contributes to fatigue and are associated with obesity, breast, and colon cancer. She agrees to continue to take prescription Vit D @50 ,000 IU every week #4 with no refills and will follow up for routine testing of vitamin D, at least 2-3 times per year. She was informed of the risk of over-replacement of vitamin D and agrees to not increase her dose unless she discusses this with [name] first. Agrees to follow up with our clinic as directed.   Diabetes II [name] has been given extensive diabetes education by myself today including ideal fasting and post-prandial blood glucose readings, individual ideal HgA1c goals  and hypoglycemia prevention. We discussed the importance of good blood sugar control to decrease the likelihood of diabetic complications such as nephropathy, neuropathy, limb loss, blindness, coronary artery disease, and death. We discussed the importance of intensive lifestyle modification including diet, exercise and weight loss as the first line treatment for diabetes. Aman agrees to continue her diabetes medications and will follow up at the agreed upon time.  At risk for  osteopenia and osteoporosis [name] was given extended  (15 minutes) osteoporosis prevention  counseling today. [name] is at risk for osteopenia and osteoporosis due to her vitamin D deficiency. She was encouraged to take her vitamin D and follow her higher calcium diet and increase strengthening exercise to help strengthen her bones and decrease her risk of osteopenia and osteoporosis.  Obesity [name] is currently in the action stage of change. As such, her goal is to continue with weight loss efforts She has agreed to follow the Category 2 plan [name] has been instructed to work up to a goal of 150 minutes of combined cardio and strengthening exercise or go back to [name] per week for weight loss and overall health benefits. We discussed the following Behavioral Modification Strategies today: increasing lean protein intake, decreasing simple carbohydrates , increasing vegetables, increasing water intake, no skipping meals, and decrease eating out.  We discussed meal planning and planning for Thanksgiving.    [name] has agreed to follow up with our clinic in 2 weeks. She was informed of the importance of frequent follow up visits to maximize her success with intensive lifestyle modifications for her multiple health conditions.   OBESITY BEHAVIORAL INTERVENTION VISIT  Today's visit was # 5   Starting weight: 260 lb Starting date: 06/08/18 Today's weight : Weight: 253 lb (114.8 kg)  Today's date: 08/16/18 Total lbs lost to date: 8 lb    ASK: We discussed the diagnosis of obesity with [name] today and [name] agreed to give [name] permission to discuss obesity behavioral modification therapy today.  ASSESS: [name] has the diagnosis of obesity and her BMI today is 44.83 [name] is in the action stage of change   ADVISE: [name] was educated on the multiple health risks of obesity as well as the benefit of weight loss to improve her health. She was advised of the need for long term treatment and the importance of lifestyle modifications to improve her current health  and to decrease her risk of future health problems.  AGREE: Multiple dietary modification options and treatment options were discussed and  [name] agreed to follow the recommendations documented in the above note.  ARRANGE: [name] was educated on the importance of frequent visits to treat obesity as outlined per CMS and USPSTF guidelines and agreed to schedule her next follow up appointment today.  Leary Roca, am acting as transcriptionist for [employer], DO   I have reviewed the above documentation for accuracy and completeness, and I agree with the above. -Jearld Lesch, DO

## 2018-08-30 ENCOUNTER — Other Ambulatory Visit: Payer: Self-pay | Admitting: Interventional Cardiology

## 2018-08-30 NOTE — Telephone Encounter (Signed)
clopidogrel (PLAVIX) 75 MG tablet  Medication  Date: 08/16/2018 Department: [facility] Office Ordering/Authorizing: Belva Crome, MD  Order Providers   Prescribing Provider Encounter Provider  Belva Crome, MD Juventino Slovak, CMA  Outpatient Medication Detail    Disp Refills Start End   clopidogrel (PLAVIX) 75 MG tablet 90 tablet 0 08/16/2018    Sig - Route: Take 1 tablet (75 mg total) by mouth daily. - Oral   Sent to pharmacy as: clopidogrel (PLAVIX) 75 MG tablet   E-Prescribing Status: Receipt confirmed by pharmacy (08/16/2018 10:11 AM EST)   Pharmacy   [id number] - [place], [place] AT [address] [place] Encounter  Priority and Order Details

## 2018-09-04 ENCOUNTER — Encounter (INDEPENDENT_AMBULATORY_CARE_PROVIDER_SITE_OTHER): Payer: Self-pay | Admitting: Bariatrics

## 2018-09-04 ENCOUNTER — Ambulatory Visit (INDEPENDENT_AMBULATORY_CARE_PROVIDER_SITE_OTHER): Payer: BC Managed Care – PPO | Admitting: Bariatrics

## 2018-09-04 VITALS — BP 123/81 | HR 68 | Temp 97.4°F | Ht 63.0 in | Wt 256.0 lb

## 2018-09-04 DIAGNOSIS — K5909 Other constipation: Secondary | ICD-10-CM

## 2018-09-04 DIAGNOSIS — Z6841 Body Mass Index (BMI) 40.0 and over, adult: Secondary | ICD-10-CM

## 2018-09-04 DIAGNOSIS — E119 Type 2 diabetes mellitus without complications: Secondary | ICD-10-CM

## 2018-09-04 DIAGNOSIS — E559 Vitamin D deficiency, unspecified: Secondary | ICD-10-CM | POA: Diagnosis not present

## 2018-09-04 DIAGNOSIS — I10 Essential (primary) hypertension: Secondary | ICD-10-CM

## 2018-09-04 DIAGNOSIS — Z9189 Other specified personal risk factors, not elsewhere classified: Secondary | ICD-10-CM | POA: Diagnosis not present

## 2018-09-05 DIAGNOSIS — I10 Essential (primary) hypertension: Secondary | ICD-10-CM | POA: Insufficient documentation

## 2018-09-05 NOTE — Progress Notes (Signed)
Office: [phone]  /  Fax: [phone]   HPI:   Chief Complaint: OBESITY [name] is here to discuss her progress with her obesity treatment plan. She is on the Category 2 plan and is following her eating plan approximately 65 % of the time. She states she is exercising 0 minutes 0 times per week. [name] struggled during the holidays with too many sweets. She has gotten back on track. She has increased water weight of 2.8 pounds. Her weight is 256 lb (116.1 kg) today and has had a weight loss of 3 pounds over a period of 2 to 3 weeks since her last visit. She has lost 4 lbs since starting treatment with [name].  Diabetes II Shila has a diagnosis of diabetes type II. Fawn is on Trulicity and Metformin and she denies any hypoglycemic episodes. Last A1c was at 6.2 She has been working on intensive lifestyle modifications including diet, exercise, and weight loss to help control her blood glucose levels.  Vitamin D deficiency Delrae has a diagnosis of vitamin D deficiency. She is currently taking high dose prescription vit D and denies nausea, vomiting or muscle weakness.  At risk for osteopenia and osteoporosis Glorianne is at higher risk of osteopenia and osteoporosis due to vitamin D deficiency.   Hypertension Evangelyn D Nils Pyle is a 37 y.o. female with hypertension. Presli D Nichols denies chest pain or headaches. She is working weight loss to help control her blood pressure with the goal of decreasing her risk of heart attack and stroke. Candices blood pressure is well controlled.  Constipation Averlee notes BM are less frequent than previously and are not hard and painful. She denies stomach pain.  ASSESSMENT AND PLAN:  Type 2 diabetes mellitus without complication, without long-term current use of insulin (HCC)  Vitamin D deficiency  Essential hypertension  At risk for osteoporosis  Other constipation  Class 3 severe obesity with serious comorbidity and body mass index  (BMI) of 45.0 to 49.9 in adult, unspecified obesity type ([place])  PLAN:  Diabetes II [name] has been given extensive diabetes education by myself today including ideal fasting and post-prandial blood glucose readings, individual ideal Hgb A1c goals and hypoglycemia prevention. We discussed the importance of good blood sugar control to decrease the likelihood of diabetic complications such as nephropathy, neuropathy, limb loss, blindness, coronary artery disease, and death. We discussed the importance of intensive lifestyle modification including diet, exercise and weight loss as the first line treatment for diabetes. [name] agrees to continue her diabetes medications and will follow up at the agreed upon time.  Vitamin D Deficiency [name] was informed that low vitamin D levels contributes to fatigue and are associated with obesity, breast, and colon cancer. She agrees to continue to take prescription Vit D @50 ,000 IU every week and will follow up for routine testing of vitamin D, at least 2-3 times per year. She was informed of the risk of over-replacement of vitamin D and agrees to not increase her dose unless she discusses this with [name] first.  At risk for osteopenia and osteoporosis [name] was given extended  (15 minutes) osteoporosis prevention counseling today. [name] is at risk for osteopenia and osteoporosis due to her vitamin D deficiency. She was encouraged to take her vitamin D and follow her higher calcium diet and increase strengthening exercise to help strengthen her bones and decrease her risk of osteopenia and osteoporosis.  Hypertension We discussed sodium restriction, working on healthy weight loss, and a regular exercise program as the  means to achieve improved blood pressure control. [name] agreed with this plan and agreed to follow up as directed. We will continue to monitor her blood pressure as well as her progress with the above lifestyle modifications. She will continue her  medications as prescribed and will watch for signs of hypotension as she continues her lifestyle modifications.  Constipation [name] was informed decrease bowel movement frequency is normal while losing weight, but stools should not be hard or painful. She was advised to work on increasing her fiber intake. High fiber foods were discussed today. [name] will begin OTC Benefiber in the afternoon. She will increase water to 64+ ounces daily.  Obesity Tuere is currently in the action stage of change. As such, her goal is to continue with weight loss efforts She has agreed to follow the Category 2 plan [name] has been instructed to work up to a goal of 150 minutes of combined cardio and strengthening exercise per week for weight loss and overall health benefits. We discussed the following Behavioral Modification Strategies today: decreasing sodium intake, increase H2O intake, no skipping meals, increasing lean protein intake, cooking more meat, decreasing simple carbohydrates and increasing vegetables  [name] has agreed to follow up with our clinic in 2 weeks. She was informed of the importance of frequent follow up visits to maximize her success with intensive lifestyle modifications for her multiple health conditions.  ALLERGIES: No Known Allergies  MEDICATIONS: Current Outpatient Medications on File Prior to Visit  Medication Sig Dispense Refill  . acetaminophen (TYLENOL) 500 MG tablet Take 500 mg by mouth every 6 (six) hours as needed for mild pain or headache.    Marland Kitchen aspirin EC 81 MG EC tablet Take 1 tablet (81 mg total) daily by mouth.    Marland Kitchen atorvastatin (LIPITOR) 40 MG tablet Take 1 tablet (40 mg total) by mouth daily. 90 tablet 3  . clopidogrel (PLAVIX) 75 MG tablet Take 1 tablet (75 mg total) by mouth daily. 90 tablet 0  . Coenzyme Q10 (COQ10) 100 MG CAPS Take 100 mg by mouth daily.    . colchicine 0.6 MG tablet Take two (2) tablets by mouth at time of initial pain. Then take one (1)  tablet by mouth twice daily as needed for gout.    . Dulaglutide (TRULICITY) 1.5 PQ/3.3AQ SOPN Inject 1 Dose into the skin once a week.    . metFORMIN (GLUCOPHAGE) 500 MG tablet Take 500 mg by mouth 2 (two) times daily with a meal.    . metoprolol tartrate (LOPRESSOR) 25 MG tablet Take 1 tablet (25 mg total) by mouth 2 (two) times daily. 180 tablet 2  . Misc Natural Products (DAILY HERBS BONE/JOINTS PO) Take 3-5 tablets daily by mouth. Insta flex    . olmesartan (BENICAR) 5 MG tablet Take 1 tablet (5 mg total) by mouth daily. 90 tablet 3  . Vitamin D, Ergocalciferol, [name] Take 1 capsule (50,000 Units total) by mouth every 7 (seven) days. 4 capsule 0   No current facility-administered medications on file prior to visit.     PAST MEDICAL HISTORY: Past Medical History:  Diagnosis Date  . Anxiety   . Back pain   . Chest pain   . Coronary artery disease   . Diabetes mellitus   . Difficulty swallowing pills   . Fatigue   . GERD (gastroesophageal reflux disease)   . Hay fever   . Headache   . Heartburn   . History of heart  attack   . HTN (hypertension)   . Hyperlipidemia   . Joint pain   . Knee pain   . Nervousness   . Palpitations   . Shortness of breath   . Shortness of breath on exertion   . Stress     PAST SURGICAL HISTORY: Past Surgical History:  Procedure Laterality Date  . CORONARY ARTERY BYPASS GRAFT N/A 08/02/2017   Procedure: CORONARY ARTERY BYPASS GRAFTING (CABG) X 5 (LIMA to LAD, LEFT RADIAL ARTERY to DIAGONAL, SVG to SEQUENTIALLY PDA and PLB) , USING LEFT INTERNAL MAMMARY ARTERY, LEFT RADIAL ARTERY, AND  SAPHENOUS VEIN to [place], RIGHT GREATER SAPHENOUS VEIN HARVESTED ENDOSCOPICALLY;  Surgeon: Grace Isaac, MD;  Location: [facility];  Service: Open Heart Surgery;  Laterality: N/A;  . CORONARY/GRAFT ACUTE MI REVASCULARIZATION N/A 07/24/2017   Procedure: Coronary/Graft Acute MI Revascularization;  Surgeon: Belva Crome, MD;   Location: [facility] [facility];  Service: Cardiovascular;  Laterality: N/A;  . LAPAROSCOPIC GASTRIC BANDING  09/02/2008  . LEFT HEART CATH AND CORONARY ANGIOGRAPHY N/A 07/24/2017   Procedure: LEFT HEART CATH AND CORONARY ANGIOGRAPHY;  Surgeon: Belva Crome, MD;  Location: [place] [facility];  Service: Cardiovascular;  Laterality: N/A;  . RADIAL ARTERY HARVEST Left 08/02/2017   Procedure: LEFT RADIAL ARTERY HARVEST;  Surgeon: Grace Isaac, MD;  Location: [place];  Service: Open Heart Surgery;  Laterality: Left;  . STERNAL CLOSURE N/A 08/02/2017   Procedure: STERNAL PLATING;  Surgeon: Grace Isaac, MD;  Location: [facility];  Service: Open Heart Surgery;  Laterality: N/A;  . [place]   had metal removed from stomach as a child  . TEE WITHOUT CARDIOVERSION N/A 08/02/2017   Procedure: TRANSESOPHAGEAL ECHOCARDIOGRAM (TEE);  Surgeon: Grace Isaac, MD;  Location: [place];  Service: Open Heart Surgery;  Laterality: N/A;    SOCIAL HISTORY: Social History   Tobacco Use  . Smoking status: Never Smoker  . Smokeless tobacco: Never Used  Substance Use Topics  . Alcohol use: Yes    Comment: occ  . Drug use: No    FAMILY HISTORY: Family History  Problem Relation Age of Onset  . Diabetes Father   . Hypertension Father   . Heart disease Father   . Sudden death Father   . Hyperlipidemia Mother   . Hypertension Mother   . Obesity Mother   . Cancer Maternal Grandmother        lung  . Cancer Maternal Grandfather        prostate    ROS: Review of Systems  Constitutional: Negative for weight loss.  Cardiovascular: Negative for chest pain.  Gastrointestinal: Positive for constipation. Negative for abdominal pain, nausea and vomiting.  Musculoskeletal:       Negative for muscle weakness  Neurological: Negative for headaches.  Endo/Heme/Allergies:       Negative for hypoglycemia    PHYSICAL EXAM: Blood pressure 123/81, pulse 68, temperature (!) 97.4 F (36.3 C),  temperature source Oral, height 5\' 3"  (1.6 m), weight 256 lb (116.1 kg), SpO2 99 %. Body mass index is 45.35 kg/m. Physical Exam Vitals signs reviewed.  Constitutional:      Appearance: Normal appearance. She is well-developed. She is obese.  Cardiovascular:     Rate and Rhythm: Normal rate.  Pulmonary:     Effort: Pulmonary effort is normal.  Musculoskeletal: Normal range of motion.  Skin:    General: Skin is warm and dry.  Neurological:     Mental  Status: She is alert and oriented to person, place, and time.  Psychiatric:        Mood and Affect: Mood normal.        Behavior: Behavior normal.     RECENT LABS AND TESTS: BMET    Component Value Date/Time   NA 138 06/12/2018 0808   K 4.2 06/12/2018 0808   CL 103 06/12/2018 0808   CO2 19 (L) 06/12/2018 0808   GLUCOSE 116 (H) 06/12/2018 0808   GLUCOSE 150 (H) 08/08/2017 0348   BUN 17 06/12/2018 0808   CREATININE 0.89 06/12/2018 0808   CALCIUM 8.9 06/12/2018 0808   GFRNONAA 82 06/12/2018 0808   GFRAA 95 06/12/2018 0808   Lab Results  Component Value Date   HGBA1C 6.2 (H) 06/12/2018   HGBA1C 7.1 (H) 08/01/2017   HGBA1C 7.4 (H) 07/25/2017   HGBA1C 7.4 (H) 07/24/2017   Lab Results  Component Value Date   INSULIN 16.3 06/12/2018   CBC    Component Value Date/Time   WBC 6.8 09/14/2017 0949   WBC 9.6 08/08/2017 0348   RBC 5.08 09/14/2017 0949   RBC 2.89 (L) 08/08/2017 0348   HGB 13.3 09/14/2017 0949   HCT 39.9 09/14/2017 0949   PLT 320 09/14/2017 0949   MCV 79 09/14/2017 0949   MCH 26.2 (L) 09/14/2017 0949   MCH 27.3 08/08/2017 0348   MCHC 33.3 09/14/2017 0949   MCHC 33.3 08/08/2017 0348   RDW 13.8 09/14/2017 0949   LYMPHSABS 1.8 07/24/2017 0518   MONOABS 0.7 07/24/2017 0518   EOSABS 0.1 07/24/2017 0518   BASOSABS 0.0 07/24/2017 0518   Iron/TIBC/Ferritin/ %Sat No results found for: IRON, TIBC, FERRITIN, IRONPCTSAT Lipid Panel     Component Value Date/Time   CHOL 112 06/12/2018 0808   TRIG 66 06/12/2018  0808   HDL 37 (L) 06/12/2018 0808   CHOLHDL 2.7 03/16/2018 0936   CHOLHDL 3.6 07/26/2017 0250   VLDL 25 07/26/2017 0250   LDLCALC 62 06/12/2018 0808   Hepatic Function Panel     Component Value Date/Time   PROT 6.6 06/12/2018 0808   ALBUMIN 3.7 06/12/2018 0808   AST 13 06/12/2018 0808   ALT 13 06/12/2018 0808   ALKPHOS 81 06/12/2018 0808   BILITOT 0.5 06/12/2018 0808   BILIDIR 0.14 03/16/2018 0936      Component Value Date/Time   TSH 1.560 06/12/2018 0808    Ref. Range 06/12/2018 08:08  Vitamin D, 25-Hydroxy Latest Ref Range: 30.0 - 100.0 ng/mL 25.7 (L)     OBESITY BEHAVIORAL INTERVENTION VISIT  Today's visit was # 6   Starting weight: 260 lbs Starting date: 06/08/2018 Today's weight : 256 lbs Today's date: 09/04/2018 Total lbs lost to date: 4   ASK: We discussed the diagnosis of obesity with Tiyanna D Nichols today and Lataysha agreed to give [name] permission to discuss obesity behavioral modification therapy today.  ASSESS: Mandisa has the diagnosis of obesity and her BMI today is 45.36 Shaquita is in the action stage of change   ADVISE: Darline was educated on the multiple health risks of obesity as well as the benefit of weight loss to improve her health. She was advised of the need for long term treatment and the importance of lifestyle modifications to improve her current health and to decrease her risk of future health problems.  AGREE: Multiple dietary modification options and treatment options were discussed and  Skylan agreed to follow the recommendations documented in the above note.  ARRANGE: Avannah was  educated on the importance of frequent visits to treat obesity as outlined per CMS and USPSTF guidelines and agreed to schedule her next follow up appointment today.  Corey Skains, am acting as Location manager for [employer]. Owens Shark, DO  I have reviewed the above documentation for accuracy and completeness, and I agree with the above. -Jearld Lesch,  DO

## 2018-09-06 ENCOUNTER — Encounter (INDEPENDENT_AMBULATORY_CARE_PROVIDER_SITE_OTHER): Payer: Self-pay

## 2018-09-27 ENCOUNTER — Ambulatory Visit (INDEPENDENT_AMBULATORY_CARE_PROVIDER_SITE_OTHER): Payer: BC Managed Care – PPO | Admitting: Bariatrics

## 2018-09-27 ENCOUNTER — Encounter (INDEPENDENT_AMBULATORY_CARE_PROVIDER_SITE_OTHER): Payer: Self-pay | Admitting: Bariatrics

## 2018-09-27 VITALS — BP 118/81 | HR 76 | Temp 97.6°F | Ht 63.0 in | Wt 255.0 lb

## 2018-09-27 DIAGNOSIS — E119 Type 2 diabetes mellitus without complications: Secondary | ICD-10-CM

## 2018-09-27 DIAGNOSIS — E559 Vitamin D deficiency, unspecified: Secondary | ICD-10-CM | POA: Diagnosis not present

## 2018-09-27 DIAGNOSIS — Z9189 Other specified personal risk factors, not elsewhere classified: Secondary | ICD-10-CM | POA: Diagnosis not present

## 2018-09-27 DIAGNOSIS — Z6841 Body Mass Index (BMI) 40.0 and over, adult: Secondary | ICD-10-CM

## 2018-09-27 MED ORDER — VITAMIN D (ERGOCALCIFEROL) 1.25 MG (50000 UNIT) PO CAPS
50000.0000 [IU] | ORAL_CAPSULE | ORAL | 0 refills | Status: DC
Start: 2018-09-27 — End: 2018-10-19

## 2018-09-28 NOTE — Progress Notes (Signed)
Office: [phone]  /  Fax: [phone]   HPI:   Chief Complaint: OBESITY [name] is here to discuss her progress with her obesity treatment plan. She is on the Category 2 plan and is following her eating plan approximately 50 % of the time. She states she is exercising 0 minutes 0 times per week. [name] is doing well overall. She tried to make good decisions over the holidays. Her weight is 255 lb (115.7 kg) today and has had a weight loss of 1 pound over a period of 3 weeks since her last visit. She has lost 5 lbs since starting treatment with [name].  Vitamin D deficiency [name] has a diagnosis of vitamin D deficiency. She is currently taking vit D and denies nausea, vomiting or muscle weakness.  At risk for osteopenia and osteoporosis [name] is at higher risk of osteopenia and osteoporosis due to vitamin D deficiency.   Diabetes II [name] has a diagnosis of diabetes type II. She is currently taking Trulicity 1.5 mg weekly and Metformin. [name] denies polyphagia. Last A1c was at 6.2 She has been working on intensive lifestyle modifications including diet, exercise, and weight loss to help control her blood glucose levels.  ASSESSMENT AND PLAN:  Vitamin D deficiency - Plan: Vitamin D, Ergocalciferol, [name]  Type 2 diabetes mellitus without complication, without long-term current use of insulin (HCC)  At risk for osteoporosis  Class 3 severe obesity with serious comorbidity and body mass index (BMI) of 45.0 to 49.9 in adult, unspecified obesity type ([name])  PLAN:  Vitamin D Deficiency [name] was informed that low vitamin D levels contributes to fatigue and are associated with obesity, breast, and colon cancer. She agrees to continue to take prescription Vit D @50 ,000 IU every week #4 with no refills and will follow up for routine testing of vitamin D, at least 2-3 times per year. She was informed of the risk of over-replacement of vitamin D  and agrees to not increase her dose unless she discusses this with [name] first. [name] agrees to follow up as directed.  At risk for osteopenia and osteoporosis [name] was given extended  (15 minutes) osteoporosis prevention counseling today. [name] is at risk for osteopenia and osteoporosis due to her vitamin D deficiency. She was encouraged to take her vitamin D and follow her higher calcium diet and increase strengthening exercise to help strengthen her bones and decrease her risk of osteopenia and osteoporosis.  Diabetes II [name] has been given extensive diabetes education by myself today including ideal fasting and post-prandial blood glucose readings, individual ideal Hgb A1c goals and hypoglycemia prevention. We discussed the importance of good blood sugar control to decrease the likelihood of diabetic complications such as nephropathy, neuropathy, limb loss, blindness, coronary artery disease, and death. We discussed the importance of intensive lifestyle modification including diet, exercise and weight loss as the first line treatment for diabetes. [name] agrees to continue her diabetes medications and will follow up at the agreed upon time.  Obesity [name] is currently in the action stage of change. As such, her goal is to continue with weight loss efforts She has agreed to follow the Category 2 plan with Category 1 and Category 2 breakfast options [name] will get to [place] to exercise for weight loss and overall health benefits. We discussed the following Behavioral Modification Strategies today: increase H2O intake, keeping healthy foods in the home, better snacking choices, increasing lean protein intake, decreasing simple carbohydrates, increasing vegetables,  work on meal planning and easy cooking plans and decrease liquid calories Handouts for homemade seasonings and store bought seasonings were provided to patient today.  [name] has agreed to follow up with our clinic in 2  weeks fasting. She was informed of the importance of frequent follow up visits to maximize her success with intensive lifestyle modifications for her multiple health conditions.  ALLERGIES: No Known Allergies  MEDICATIONS: Current Outpatient Medications on File Prior to Visit  Medication Sig Dispense Refill  . acetaminophen (TYLENOL) 500 MG tablet Take 500 mg by mouth every 6 (six) hours as needed for mild pain or headache.    Marland Kitchen aspirin EC 81 MG EC tablet Take 1 tablet (81 mg total) daily by mouth.    Marland Kitchen atorvastatin (LIPITOR) 40 MG tablet Take 1 tablet (40 mg total) by mouth daily. 90 tablet 3  . clopidogrel (PLAVIX) 75 MG tablet Take 1 tablet (75 mg total) by mouth daily. 90 tablet 0  . Coenzyme Q10 (COQ10) 100 MG CAPS Take 100 mg by mouth daily.    . colchicine 0.6 MG tablet Take two (2) tablets by mouth at time of initial pain. Then take one (1) tablet by mouth twice daily as needed for gout.    . Dulaglutide (TRULICITY) 1.5 EX/5.1ZG SOPN Inject 1 Dose into the skin once a week.    . metFORMIN (GLUCOPHAGE) 500 MG tablet Take 500 mg by mouth 2 (two) times daily with a meal.    . metoprolol tartrate (LOPRESSOR) 25 MG tablet Take 1 tablet (25 mg total) by mouth 2 (two) times daily. 180 tablet 2  . Misc Natural Products (DAILY HERBS BONE/JOINTS PO) Take 3-5 tablets daily by mouth. Insta flex    . olmesartan (BENICAR) 5 MG tablet Take 1 tablet (5 mg total) by mouth daily. 90 tablet 3   No current facility-administered medications on file prior to visit.     PAST MEDICAL HISTORY: Past Medical History:  Diagnosis Date  . Anxiety   . Back pain   . Chest pain   . Coronary artery disease   . Diabetes mellitus   . Difficulty swallowing pills   . Fatigue   . GERD (gastroesophageal reflux disease)   . Hay fever   . Headache   . Heartburn   . History of heart attack   . HTN (hypertension)   . Hyperlipidemia   . Joint pain   . Knee pain   . Nervousness   . Palpitations   . Shortness  of breath   . Shortness of breath on exertion   . Stress     PAST SURGICAL HISTORY: Past Surgical History:  Procedure Laterality Date  . CORONARY ARTERY BYPASS GRAFT N/A 08/02/2017   Procedure: CORONARY ARTERY BYPASS GRAFTING (CABG) X 5 (LIMA to LAD, LEFT RADIAL ARTERY to DIAGONAL, SVG to SEQUENTIALLY PDA and PLB) , USING LEFT INTERNAL MAMMARY ARTERY, LEFT RADIAL ARTERY, AND  SAPHENOUS VEIN to [place], RIGHT GREATER SAPHENOUS VEIN HARVESTED ENDOSCOPICALLY;  Surgeon: Grace Isaac, MD;  Location: [place];  Service: Open Heart Surgery;  Laterality: N/A;  . CORONARY/GRAFT ACUTE MI REVASCULARIZATION N/A 07/24/2017   Procedure: Coronary/Graft Acute MI Revascularization;  Surgeon: Belva Crome, MD;  Location: [facility];  Service: Cardiovascular;  Laterality: N/A;  . LAPAROSCOPIC GASTRIC BANDING  09/02/2008  . LEFT HEART CATH AND CORONARY ANGIOGRAPHY N/A 07/24/2017   Procedure: LEFT HEART CATH AND CORONARY ANGIOGRAPHY;  Surgeon: Belva Crome, MD;  Location: [place] [facility];  Service: Cardiovascular;  Laterality: N/A;  . RADIAL ARTERY HARVEST Left 08/02/2017   Procedure: LEFT RADIAL ARTERY HARVEST;  Surgeon: Grace Isaac, MD;  Location: [place];  Service: Open Heart Surgery;  Laterality: Left;  . STERNAL CLOSURE N/A 08/02/2017   Procedure: STERNAL PLATING;  Surgeon: Grace Isaac, MD;  Location: [place];  Service: Open Heart Surgery;  Laterality: N/A;  . [place]   had metal removed from stomach as a child  . TEE WITHOUT CARDIOVERSION N/A 08/02/2017   Procedure: TRANSESOPHAGEAL ECHOCARDIOGRAM (TEE);  Surgeon: Grace Isaac, MD;  Location: [place];  Service: Open Heart Surgery;  Laterality: N/A;    SOCIAL HISTORY: Social History   Tobacco Use  . Smoking status: Never Smoker  . Smokeless tobacco: Never Used  Substance Use Topics  . Alcohol use: Yes    Comment: occ  . Drug use: No    FAMILY HISTORY: Family History  Problem Relation Age of Onset    . Diabetes Father   . Hypertension Father   . Heart disease Father   . Sudden death Father   . Hyperlipidemia Mother   . Hypertension Mother   . Obesity Mother   . Cancer Maternal Grandmother        lung  . Cancer Maternal Grandfather        prostate    ROS: Review of Systems  Constitutional: Positive for weight loss.  Gastrointestinal: Negative for nausea and vomiting.  Musculoskeletal:       Negative for muscle weakness  Endo/Heme/Allergies:       Negative for polyphagia    PHYSICAL EXAM: Blood pressure 118/81, pulse 76, temperature 97.6 F (36.4 C), temperature source Oral, height 5\' 3"  (1.6 m), weight 255 lb (115.7 kg), SpO2 100 %. Body mass index is 45.17 kg/m. Physical Exam Vitals signs reviewed.  Constitutional:      Appearance: Normal appearance. She is well-developed. She is obese.  Cardiovascular:     Rate and Rhythm: Normal rate.  Pulmonary:     Effort: Pulmonary effort is normal.  Musculoskeletal: Normal range of motion.  Skin:    General: Skin is warm and dry.  Neurological:     Mental Status: She is alert and oriented to person, place, and time.  Psychiatric:        Mood and Affect: Mood normal.        Behavior: Behavior normal.     RECENT LABS AND TESTS: BMET    Component Value Date/Time   NA 138 06/12/2018 0808   K 4.2 06/12/2018 0808   CL 103 06/12/2018 0808   CO2 19 (L) 06/12/2018 0808   GLUCOSE 116 (H) 06/12/2018 0808   GLUCOSE 150 (H) 08/08/2017 0348   BUN 17 06/12/2018 0808   CREATININE 0.89 06/12/2018 0808   CALCIUM 8.9 06/12/2018 0808   GFRNONAA 82 06/12/2018 0808   GFRAA 95 06/12/2018 0808   Lab Results  Component Value Date   HGBA1C 6.2 (H) 06/12/2018   HGBA1C 7.1 (H) 08/01/2017   HGBA1C 7.4 (H) 07/25/2017   HGBA1C 7.4 (H) 07/24/2017   Lab Results  Component Value Date   INSULIN 16.3 06/12/2018   CBC    Component Value Date/Time   WBC 6.8 09/14/2017 0949   WBC 9.6 08/08/2017 0348   RBC 5.08 09/14/2017 0949    RBC 2.89 (L) 08/08/2017 0348   HGB 13.3 09/14/2017 0949   HCT 39.9 09/14/2017 0949   PLT 320 09/14/2017 0949  MCV 79 09/14/2017 0949   MCH 26.2 (L) 09/14/2017 0949   MCH 27.3 08/08/2017 0348   MCHC 33.3 09/14/2017 0949   MCHC 33.3 08/08/2017 0348   RDW 13.8 09/14/2017 0949   LYMPHSABS 1.8 07/24/2017 0518   MONOABS 0.7 07/24/2017 0518   EOSABS 0.1 07/24/2017 0518   BASOSABS 0.0 07/24/2017 0518   Iron/TIBC/Ferritin/ %Sat No results found for: IRON, TIBC, FERRITIN, IRONPCTSAT Lipid Panel     Component Value Date/Time   CHOL 112 06/12/2018 0808   TRIG 66 06/12/2018 0808   HDL 37 (L) 06/12/2018 0808   CHOLHDL 2.7 03/16/2018 0936   CHOLHDL 3.6 07/26/2017 0250   VLDL 25 07/26/2017 0250   LDLCALC 62 06/12/2018 0808   Hepatic Function Panel     Component Value Date/Time   PROT 6.6 06/12/2018 0808   ALBUMIN 3.7 06/12/2018 0808   AST 13 06/12/2018 0808   ALT 13 06/12/2018 0808   ALKPHOS 81 06/12/2018 0808   BILITOT 0.5 06/12/2018 0808   BILIDIR 0.14 03/16/2018 0936      Component Value Date/Time   TSH 1.560 06/12/2018 0808     Ref. Range 06/12/2018 08:08  Vitamin D, 25-Hydroxy Latest Ref Range: 30.0 - 100.0 ng/mL 25.7 (L)     OBESITY BEHAVIORAL INTERVENTION VISIT  Today's visit was # 7   Starting weight: 260 lbs Starting date: 06/08/2018 Today's weight : 255 lbs  Today's date: 09/27/2018 Total lbs lost to date: 5   ASK: We discussed the diagnosis of obesity with Malasia D Nichols today and Kura agreed to give [name] permission to discuss obesity behavioral modification therapy today.  ASSESS: Caela has the diagnosis of obesity and her BMI today is 45.18 Sharine is in the action stage of change   ADVISE: Venida was educated on the multiple health risks of obesity as well as the benefit of weight loss to improve her health. She was advised of the need for long term treatment and the importance of lifestyle modifications to improve her current health and to  decrease her risk of future health problems.  AGREE: Multiple dietary modification options and treatment options were discussed and  Hera agreed to follow the recommendations documented in the above note.  ARRANGE: Disa was educated on the importance of frequent visits to treat obesity as outlined per CMS and USPSTF guidelines and agreed to schedule her next follow up appointment today.  Corey Skains, am acting as Location manager for [employer]. Owens Shark, DO  I have reviewed the above documentation for accuracy and completeness, and I agree with the above. -Jearld Lesch, DO

## 2018-10-04 NOTE — Progress Notes (Signed)
Office: [phone]  /  Fax: [phone]    Date: October 09, 2018   Time Seen: 12:00pm Duration: 35 minutes Provider: Glennie Isle, Psy.D. Type of Session: Individual Therapy  Type of Contact: Face-to-face  HPI: [name] was referred by Dr. Jearld Lesch due to depression with emotional eating behaviors, and was seen for an initial appointment with this provider on July 17, 2018. Per the note for the initial visit with Dr. Jearld Lesch on June 08, 2018, "Candiceis struggling with emotional eating and using food for comfort to the extent that it is negatively impacting herhealth. Sheoften snacks when sheis not hungry. Candicesometimes feels sheis out of control and then feels guilty that shemade poor food choices. Sheis attempting to workon behavior modification techniques to help reduceheremotional eating. Sheshows no sign of suicidal or homicidal ideations." [name]'s Food and Mood (modified PHQ-9) score was16. In addition, per the note for the initial visit with Dr. Jearld Lesch on June 08, 2018, [name] has been heavy most of herlife and shestarted gaining weight at age 43. Her heaviest weight ever was 347pounds. During the initial appointment with Dr. Owens Shark, [facility] reported experiencing the following: significant food cravings issues; snacking frequently in the evenings; skipping meals frequently; frequently drinking liquids with calories; frequently making poor food choices; problems with excessive hunger; and struggling with emotional eating. In addition, the noted indicated [name] had an Adjustable Gastric Banding (Lap Band) placed around 2008 and 2009. The note included, "Her heaviest weight was at 347 pounds and her lowest weight was at 220 pounds. [name] feels like she has minimal resistance and her last adjustment was five to seven years ago."   During the initial appointment with this provider, [name] reported engaging in emotional eating when she is  overwhelmed and stressed. She also reported engaging in overeating and noted the last time was yesterday during her homecoming celebration. She noted the last episode of emotional eating was approximately three weeks ago secondary to work stress [name] indicated she consumed chips and candy corn. In addition, she described craving chips, granola bars, and candy. She also noted grazing behaviors. Moreover, [name] discussed emotional and overeating eating behaviors started in childhood. She denied a history of binge eating. [name] also denied engagement in purging and other compensatory strategies. She has never been diagnosed with an eating disorder. Furthermore, [name] was asked to complete a questionnaire assessing various behaviors related to emotional eating. [name] endorsed the following: overeat when you are celebrating, experience food cravings on a regular basis, eat certain foods when you are anxious, stressed, depressed, or your feelings are hurt, use food to help you cope with emotional situations, find food is comforting to you, overeat when you are angry or upset, overeat when you are worried about something, overeat frequently when you are bored or lonely, overeat when you are alone, but eat much less when you are with other people and eat as a reward.  During today's appointment, [name] reported incidents of emotional hunger and subsequent eating since the last appointment with this provider.  Session Content: Session focused on the following treatment goal: decrease emotional eating. The session was initiated with the administration of the PHQ-9 and GAD-7, as well as a brief check-in. This provider discussed the importance and benefit of consistent therapeutic services. [name] shared due to preparations and stressors associated with her wedding, she was unable to meet with this provider. She noted that there are no other upcoming circumstances that would prevent her from scheduling and  attending appointments regularly.  During the holidays, [name] noted "Thanksgiving was difficult;" however, she noted she lost one pound at her last appointment with Dr. Owens Shark. Regarding the meal plan, she shared she is following it 70% of the time since the new year. Regarding emotional eating, [name] noted it "depends on my day at work." She shared about instances of emotional eating. This provider reviewed physical and emotional hunger, and psychoeducation regarding triggers for emotional eating was provided. [name] was provided a handout, and encouraged to utilize the handout between now and the next appointment to increase awareness of triggers and frequency. [name] agreed. This provider also discussed behavioral strategies for specific triggers, such as placing the utensil down when conversing to avoid mindless eating. [name] was receptive to today's session as evidenced by openness to sharing, responsiveness to feedback, and willingness to identify triggers for emotional eating. Additionally, she was observed writing notes on her handout for triggers for emotional eating.   Mental Status Examination: [name] arrived on time for the appointment. She presented as appropriately dressed and groomed. [name] appeared her stated age and demonstrated adequate orientation to time, place, person, and purpose of the appointment. She also demonstrated appropriate eye contact. No psychomotor abnormalities or behavioral peculiarities noted. Her mood was euthymic with congruent affect. Her thought processes were logical, linear, and goal-directed. No hallucinations, delusions, bizarre thinking or behavior reported or observed. Judgment, insight, and impulse control appeared to be grossly intact. There was no evidence of paraphasias (i.e., errors in speech, gross mispronunciations, and word substitutions), repetition deficits, or disturbances in volume or prosody (i.e., rhythm and intonation). There was no evidence of  attention or memory impairments. [name] denied current suicidal and homicidal ideation, intent or plan.  Structured Assessment Results: The Patient Health Questionnaire-9 (PHQ-9) is a self-report measure that assesses symptoms and severity of depression over the course of the last two weeks. Andraya obtained a score of zero. Depression screen [facility] 2/9 10/09/2018  Decreased Interest 0  Down, Depressed, Hopeless 0  PHQ - 2 Score 0  Altered sleeping 0  Tired, decreased energy 0  Change in appetite 0  Feeling bad or failure about yourself  0  Trouble concentrating 0  Moving slowly or fidgety/restless 0  Suicidal thoughts 0  PHQ-9 Score 0  Difficult doing work/chores -   The Generalized Anxiety Disorder-7 (GAD-7) is a brief self-report measure that assesses symptoms of anxiety over the course of the last two weeks. Isaiah obtained a score of zero. GAD 7 : Generalized Anxiety Score 10/09/2018  Nervous, Anxious, on Edge 0  Control/stop worrying 0  Worry too much - different things 0  Trouble relaxing 0  Restless 0  Easily annoyed or irritable 0  Afraid - awful might happen 0  Total GAD 7 Score 0   Interventions:  Administration of PHQ-9 and GAD-7 for symptom monitoring Review of content from the previous session Empathic reflections and validation Psychoeducation regarding triggers for emotional eating Rapport building Brief chart review  DSM-5 Diagnosis: 311 (F32.8) Other Specified Depressive Disorder, Emotional Eating  Treatment Goal & Progress: During the initial appointment with this provider, the following treatment goal was established: decrease emotional eating. Progress is limited, as Jaysie has just begun treatment with this provider; however, she is receptive to the interaction and interventions and rapport is being established. Nevertheless, Hephzibah has demonstrated increased awareness of hunger patterns and willingness to identify triggers for emotional eating.   Plan:  Daanya continues to appear able and willing to participate as evidenced by engagement in  reciprocal conversation, and asking questions for clarification as appropriate. The next appointment will be scheduled in two weeks. The next session will focus on reviewing triggers for emotional eating and the introduction of mindfulness.

## 2018-10-09 ENCOUNTER — Ambulatory Visit (INDEPENDENT_AMBULATORY_CARE_PROVIDER_SITE_OTHER): Payer: BC Managed Care – PPO | Admitting: Psychology

## 2018-10-09 DIAGNOSIS — F3289 Other specified depressive episodes: Secondary | ICD-10-CM | POA: Diagnosis not present

## 2018-10-10 NOTE — Progress Notes (Signed)
Office: [phone]  /  Fax: [phone]    Date: October 23, 2018   Time Seen: 4:30pm Duration: 30 minutes Provider: Glennie Isle, Psy.D. Type of Session: Individual Therapy  Type of Contact: Face-to-face  Session Content: [name] is a 38 y.o. female presenting for a follow-up appointment to address the previously established treatment goal of decreasing emotional eating. The session was initiated with the administration of the PHQ-9 and GAD-7, as well as a brief check-in. [name] indicated an increase in physical activity, but noted disappointment as she maintained her weight. This was explored further. [name] stated she was following the meal plan, but acknowledged two episodes of emotional eating resulting in her snacking (e.g., cereal, crackers, candy, chips). Nevertheless, she indicated an overall decrease in emotional eating. Regarding triggers for emotional eating, [name] stated she observed the following: unpleasant feelings, boredom, out of habit, and stress. Psychoeducation regarding mindfulness was provided. A handout was provided to [facility] with further information regarding mindfulness, including exercises. This provider also explained the benefit of mindfulness as it relates to emotional eating. [name] was encouraged to engage in the provided exercises between now and the next appointment with this provider. [name] agreed. She was led through an exercise involving her senses, and an exercise in which she observed her emotions. She described becoming "unstuck" during the second exercise. [name] was receptive to today's session as evidenced by openness to sharing, responsiveness to feedback, and engagement in mindfulness exercises.  Mental Status Examination: [name] arrived on time for the appointment. She presented as appropriately dressed and groomed. [name] appeared her stated age and demonstrated adequate orientation to time, place, person, and purpose of the appointment.  She also demonstrated appropriate eye contact. No psychomotor abnormalities or behavioral peculiarities noted. Her mood was euthymic with congruent affect. Her thought processes were logical, linear, and goal-directed. No hallucinations, delusions, bizarre thinking or behavior reported or observed. Judgment, insight, and impulse control appeared to be grossly intact. There was no evidence of paraphasias (i.e., errors in speech, gross mispronunciations, and word substitutions), repetition deficits, or disturbances in volume or prosody (i.e., rhythm and intonation). There was no evidence of attention or memory impairments. [name] denied current suicidal and homicidal ideation, plan and intent.   Structured Assessment Results: The Patient Health Questionnaire-9 (PHQ-9) is a self-report measure that assesses symptoms and severity of depression over the course of the last two weeks. [name] obtained a score of zero. Depression screen [facility] 2/9 10/23/2018  Decreased Interest 0  Down, Depressed, Hopeless 0  PHQ - 2 Score 0  Altered sleeping 0  Tired, decreased energy 0  Change in appetite 0  Feeling bad or failure about yourself  0  Trouble concentrating 0  Moving slowly or fidgety/restless 0  Suicidal thoughts 0  PHQ-9 Score 0  Difficult doing work/chores -   The Generalized Anxiety Disorder-7 (GAD-7) is a brief self-report measure that assesses symptoms of anxiety over the course of the last two weeks. [name] obtained a score of zero. GAD 7 : Generalized Anxiety Score 10/09/2018  Nervous, Anxious, on Edge 0  Control/stop worrying 0  Worry too much - different things 0  Trouble relaxing 0  Restless 0  Easily annoyed or irritable 0  Afraid - awful might happen 0  Total GAD 7 Score 0   Interventions:  Administration of PHQ-9 and GAD-7 for symptom monitoring Review of content from the previous session Empathic reflections and validation Psychoeducation regarding mindfulness Mindfulness  exercise Brief chart review  DSM-5 Diagnosis: 311 (F32.8) Other  Specified Depressive Disorder, Emotional Eating  Treatment Goal & Progress: During the initial appointment with this provider, the following treatment goal was established: decrease emotional eating. [name] has demonstrated progress in her goal as evidenced by increased awareness in hunger patterns and triggers for emotional eating. During today's appointment, [name] noted an overall decrease in emotional eating, and demonstrated willingness to engage in mindfulness.  Plan: [name] continues to appear able and willing to participate as evidenced by engagement in reciprocal conversation, and asking questions for clarification as appropriate. The next appointment will be scheduled in two weeks. The next session will focus further on mindfulness as well as termination planning.

## 2018-10-19 ENCOUNTER — Ambulatory Visit (INDEPENDENT_AMBULATORY_CARE_PROVIDER_SITE_OTHER): Payer: BC Managed Care – PPO | Admitting: Bariatrics

## 2018-10-19 ENCOUNTER — Encounter (INDEPENDENT_AMBULATORY_CARE_PROVIDER_SITE_OTHER): Payer: Self-pay | Admitting: Bariatrics

## 2018-10-19 VITALS — BP 126/83 | HR 72 | Temp 97.8°F | Ht 63.0 in | Wt 255.0 lb

## 2018-10-19 DIAGNOSIS — E119 Type 2 diabetes mellitus without complications: Secondary | ICD-10-CM

## 2018-10-19 DIAGNOSIS — E559 Vitamin D deficiency, unspecified: Secondary | ICD-10-CM

## 2018-10-19 DIAGNOSIS — Z9189 Other specified personal risk factors, not elsewhere classified: Secondary | ICD-10-CM

## 2018-10-19 DIAGNOSIS — Z6841 Body Mass Index (BMI) 40.0 and over, adult: Secondary | ICD-10-CM

## 2018-10-19 DIAGNOSIS — F3289 Other specified depressive episodes: Secondary | ICD-10-CM

## 2018-10-19 MED ORDER — VITAMIN D (ERGOCALCIFEROL) 1.25 MG (50000 UNIT) PO CAPS: 50000.0000 [IU] | ORAL_CAPSULE | ORAL | 0 refills | Status: DC

## 2018-10-19 NOTE — Progress Notes (Signed)
Office: [phone]  /  Fax: [phone]   HPI:   Chief Complaint: OBESITY [name] is here to discuss her progress with her obesity treatment plan. She is on the Category 2 plan and is following her eating plan approximately 65 % of the time. She states she is doing cardio exercise 20 to 60 minutes 2 to 3 times per week. [name] is doing fairly well overall. She has started to exercise. Her weight is 255 lb (115.7 kg) today and she has maintained weight over a period of 3 weeks since her last visit. She has lost 5 lbs since starting treatment with [name].  Vitamin D deficiency [name] has a diagnosis of vitamin D deficiency. She is currently taking vit D and denies nausea, vomiting or muscle weakness.  At risk for osteopenia and osteoporosis [name] is at higher risk of osteopenia and osteoporosis due to vitamin D deficiency.   Diabetes II [name] has a diagnosis of diabetes type II. She is taking Trulicity and Metformin. [name] denies any hypoglycemic episodes. Last A1c was at 6.2 and last insulin level was at 16.3 She has been working on intensive lifestyle modifications including diet, exercise, and weight loss to help control her blood glucose levels.  Depression with emotional eating behaviors [name] is struggling with emotional eating and using food for comfort to the extent that it is negatively impacting her health. She often snacks when she is not hungry. [name] sometimes feels she is out of control and then feels guilty that she made poor food choices. She has been working on behavior modification techniques to help reduce her emotional eating and has been minimally successful. She shows no sign of suicidal or homicidal ideations.  Depression screen [name] [facility] 2/9 10/09/2018 07/17/2018 06/08/2018 09/26/2017  Decreased Interest 0 0 3 0  Down, Depressed, Hopeless 0 0 1 0  PHQ - 2 Score 0 0 4 0  Altered sleeping 0 0 1 -  Tired, decreased energy 0 0 3 -  Change in appetite 0 0 2 -    Feeling bad or failure about yourself  0 0 3 -  Trouble concentrating 0 0 1 -  Moving slowly or fidgety/restless 0 0 2 -  Suicidal thoughts 0 0 0 -  PHQ-9 Score 0 0 16 -  Difficult doing work/chores - - Not difficult at all -       ASSESSMENT AND PLAN:  Vitamin D deficiency - Plan: VITAMIN D 25 Hydroxy (Vit-D Deficiency, Fractures), Vitamin D, Ergocalciferol, [name]  Type 2 diabetes mellitus without complication, without long-term current use of insulin (HCC) - Plan: Comprehensive metabolic panel, Hemoglobin A1c, Insulin, random  Other depression - with emotional eating  At risk for osteoporosis  Class 3 severe obesity with serious comorbidity and body mass index (BMI) of 45.0 to 49.9 in adult, unspecified obesity type (HCC)  PLAN:  Vitamin D Deficiency [name] was informed that low vitamin D levels contributes to fatigue and are associated with obesity, breast, and colon cancer. She agrees to continue to take prescription Vit D @50 ,000 IU every week #4 with no refills and will follow up for routine testing of vitamin D, at least 2-3 times per year. She was informed of the risk of over-replacement of vitamin D and agrees to not increase her dose unless she discusses this with [name] first. [name] agrees to follow up as directed.  At risk for osteopenia and osteoporosis [name] was given extended  (15 minutes) osteoporosis prevention counseling  today. [name] is at risk for osteopenia and osteoporosis due to her vitamin D deficiency. She was encouraged to take her vitamin D and follow her higher calcium diet and increase strengthening exercise to help strengthen her bones and decrease her risk of osteopenia and osteoporosis.  Diabetes II [name] has been given extensive diabetes education by myself today including ideal fasting and post-prandial blood glucose readings, individual ideal Hgb A1c goals and hypoglycemia prevention. We discussed the  importance of good blood sugar control to decrease the likelihood of diabetic complications such as nephropathy, neuropathy, limb loss, blindness, coronary artery disease, and death. We discussed the importance of intensive lifestyle modification including diet, exercise and weight loss as the first line treatment for diabetes. [name] will continue to see Dr. Mallie Mussel.  Depression with Emotional Eating Behaviors We discussed behavior modification techniques today to help [name] deal with her emotional eating and depression. She has agreed to take Wellbutrin SR 150 mg qd and agreed to follow up as directed.  Obesity [name] is currently in the action stage of change. As such, her goal is to continue with weight loss efforts She has agreed to follow the Category 2 plan with Category 1 and Category 2 breakfast options [name] has been instructed to work up to a goal of 150 minutes of combined cardio and strengthening exercise per week for weight loss and overall health benefits. We discussed the following Behavioral Modification Strategies today: increase H2O intake (fruit infuser), keeping healthy foods in the home, increasing lean protein intake, decreasing simple carbohydrates, increasing vegetables and work on meal planning and easy cooking plans  [name] has agreed to follow up with our clinic in 2 weeks. She was informed of the importance of frequent follow up visits to maximize her success with intensive lifestyle modifications for her multiple health conditions.  ALLERGIES: No Known Allergies  MEDICATIONS: Current Outpatient Medications on File Prior to Visit  Medication Sig Dispense Refill  . acetaminophen (TYLENOL) 500 MG tablet Take 500 mg by mouth every 6 (six) hours as needed for mild pain or headache.    Marland Kitchen aspirin EC 81 MG EC tablet Take 1 tablet (81 mg total) daily by mouth.    Marland Kitchen atorvastatin (LIPITOR) 40 MG tablet Take 1 tablet (40 mg total) by mouth daily. 90 tablet 3  .  clopidogrel (PLAVIX) 75 MG tablet Take 1 tablet (75 mg total) by mouth daily. 90 tablet 0  . Coenzyme Q10 (COQ10) 100 MG CAPS Take 100 mg by mouth daily.    . colchicine 0.6 MG tablet Take two (2) tablets by mouth at time of initial pain. Then take one (1) tablet by mouth twice daily as needed for gout.    . Dulaglutide (TRULICITY) 1.5 YW/7.3XT SOPN Inject 1 Dose into the skin once a week.    . metFORMIN (GLUCOPHAGE) 500 MG tablet Take 500 mg by mouth 2 (two) times daily with a meal.    . metoprolol tartrate (LOPRESSOR) 25 MG tablet Take 1 tablet (25 mg total) by mouth 2 (two) times daily. 180 tablet 2  . Misc Natural Products (DAILY HERBS BONE/JOINTS PO) Take 3-5 tablets daily by mouth. Insta flex    . olmesartan (BENICAR) 5 MG tablet Take 1 tablet (5 mg total) by mouth daily. 90 tablet 3   No current facility-administered medications on file prior to visit.     PAST MEDICAL HISTORY: Past Medical History:  Diagnosis Date  . Anxiety   . Back pain   . Chest pain   .  Coronary artery disease   . Diabetes mellitus   . Difficulty swallowing pills   . Fatigue   . GERD (gastroesophageal reflux disease)   . Hay fever   . Headache   . Heartburn   . History of heart attack   . HTN (hypertension)   . Hyperlipidemia   . Joint pain   . Knee pain   . Nervousness   . Palpitations   . Shortness of breath   . Shortness of breath on exertion   . Stress     PAST SURGICAL HISTORY: Past Surgical History:  Procedure Laterality Date  . CORONARY ARTERY BYPASS GRAFT N/A 08/02/2017   Procedure: CORONARY ARTERY BYPASS GRAFTING (CABG) X 5 (LIMA to LAD, LEFT RADIAL ARTERY to DIAGONAL, SVG to SEQUENTIALLY PDA and PLB) , USING LEFT INTERNAL MAMMARY ARTERY, LEFT RADIAL ARTERY, AND  SAPHENOUS VEIN to [place], RIGHT GREATER SAPHENOUS VEIN HARVESTED ENDOSCOPICALLY;  Surgeon: Grace Isaac, MD;  Location: [place];  Service: Open Heart Surgery;  Laterality: N/A;  . CORONARY/GRAFT ACUTE MI  REVASCULARIZATION N/A 07/24/2017   Procedure: Coronary/Graft Acute MI Revascularization;  Surgeon: Belva Crome, MD;  Location: [place] [facility];  Service: Cardiovascular;  Laterality: N/A;  . LAPAROSCOPIC GASTRIC BANDING  09/02/2008  . LEFT HEART CATH AND CORONARY ANGIOGRAPHY N/A 07/24/2017   Procedure: LEFT HEART CATH AND CORONARY ANGIOGRAPHY;  Surgeon: Belva Crome, MD;  Location: [place] [facility];  Service: Cardiovascular;  Laterality: N/A;  . RADIAL ARTERY HARVEST Left 08/02/2017   Procedure: LEFT RADIAL ARTERY HARVEST;  Surgeon: Grace Isaac, MD;  Location: [place];  Service: Open Heart Surgery;  Laterality: Left;  . STERNAL CLOSURE N/A 08/02/2017   Procedure: STERNAL PLATING;  Surgeon: Grace Isaac, MD;  Location: [place];  Service: Open Heart Surgery;  Laterality: N/A;  . [place]   had metal removed from stomach as a child  . TEE WITHOUT CARDIOVERSION N/A 08/02/2017   Procedure: TRANSESOPHAGEAL ECHOCARDIOGRAM (TEE);  Surgeon: Grace Isaac, MD;  Location: [place];  Service: Open Heart Surgery;  Laterality: N/A;    SOCIAL HISTORY: Social History   Tobacco Use  . Smoking status: Never Smoker  . Smokeless tobacco: Never Used  Substance Use Topics  . Alcohol use: Yes    Comment: occ  . Drug use: No    FAMILY HISTORY: Family History  Problem Relation Age of Onset  . Diabetes Father   . Hypertension Father   . Heart disease Father   . Sudden death Father   . Hyperlipidemia Mother   . Hypertension Mother   . Obesity Mother   . Cancer Maternal Grandmother        lung  . Cancer Maternal Grandfather        prostate    ROS: Review of Systems  Constitutional: Negative for weight loss.  Gastrointestinal: Negative for nausea and vomiting.  Musculoskeletal:       Negative for muscle weakness  Endo/Heme/Allergies:       Negative for hypoglycemia  Psychiatric/Behavioral: Positive for depression. Negative for suicidal ideas.    PHYSICAL  EXAM: Blood pressure 126/83, pulse 72, temperature 97.8 F (36.6 C), temperature source Oral, height 5\' 3"  (1.6 m), weight 255 lb (115.7 kg), SpO2 99 %. Body mass index is 45.17 kg/m. Physical Exam Vitals signs reviewed.  Constitutional:      Appearance: Normal appearance. She is well-developed. She is obese.  Cardiovascular:     Rate and Rhythm: Normal  rate.  Pulmonary:     Effort: Pulmonary effort is normal.  Musculoskeletal: Normal range of motion.  Skin:    General: Skin is warm and dry.  Neurological:     Mental Status: She is alert and oriented to person, place, and time.  Psychiatric:        Mood and Affect: Mood normal.        Behavior: Behavior normal.        Thought Content: Thought content does not include homicidal or suicidal ideation.     RECENT LABS AND TESTS: BMET    Component Value Date/Time   NA 138 06/12/2018 0808   K 4.2 06/12/2018 0808   CL 103 06/12/2018 0808   CO2 19 (L) 06/12/2018 0808   GLUCOSE 116 (H) 06/12/2018 0808   GLUCOSE 150 (H) 08/08/2017 0348   BUN 17 06/12/2018 0808   CREATININE 0.89 06/12/2018 0808   CALCIUM 8.9 06/12/2018 0808   GFRNONAA 82 06/12/2018 0808   GFRAA 95 06/12/2018 0808   Lab Results  Component Value Date   HGBA1C 6.2 (H) 06/12/2018   HGBA1C 7.1 (H) 08/01/2017   HGBA1C 7.4 (H) 07/25/2017   HGBA1C 7.4 (H) 07/24/2017   Lab Results  Component Value Date   INSULIN 16.3 06/12/2018   CBC    Component Value Date/Time   WBC 6.8 09/14/2017 0949   WBC 9.6 08/08/2017 0348   RBC 5.08 09/14/2017 0949   RBC 2.89 (L) 08/08/2017 0348   HGB 13.3 09/14/2017 0949   HCT 39.9 09/14/2017 0949   PLT 320 09/14/2017 0949   MCV 79 09/14/2017 0949   MCH 26.2 (L) 09/14/2017 0949   MCH 27.3 08/08/2017 0348   MCHC 33.3 09/14/2017 0949   MCHC 33.3 08/08/2017 0348   RDW 13.8 09/14/2017 0949   LYMPHSABS 1.8 07/24/2017 0518   MONOABS 0.7 07/24/2017 0518   EOSABS 0.1 07/24/2017 0518   BASOSABS 0.0 07/24/2017 0518    Iron/TIBC/Ferritin/ %Sat No results found for: IRON, TIBC, FERRITIN, IRONPCTSAT Lipid Panel     Component Value Date/Time   CHOL 112 06/12/2018 0808   TRIG 66 06/12/2018 0808   HDL 37 (L) 06/12/2018 0808   CHOLHDL 2.7 03/16/2018 0936   CHOLHDL 3.6 07/26/2017 0250   VLDL 25 07/26/2017 0250   LDLCALC 62 06/12/2018 0808   Hepatic Function Panel     Component Value Date/Time   PROT 6.6 06/12/2018 0808   ALBUMIN 3.7 06/12/2018 0808   AST 13 06/12/2018 0808   ALT 13 06/12/2018 0808   ALKPHOS 81 06/12/2018 0808   BILITOT 0.5 06/12/2018 0808   BILIDIR 0.14 03/16/2018 0936      Component Value Date/Time   TSH 1.560 06/12/2018 0808     Ref. Range 06/12/2018 08:08  Vitamin D, 25-Hydroxy Latest Ref Range: 30.0 - 100.0 ng/mL 25.7 (L)     OBESITY BEHAVIORAL INTERVENTION VISIT  Today's visit was # 8   Starting weight: 260 lbs Starting date: 06/08/2018 Today's weight : 255 lbs Today's date: 10/19/2018 Total lbs lost to date: 5   ASK: We discussed the diagnosis of obesity with Ikeisha D Nichols today and Mailyn agreed to give [name] permission to discuss obesity behavioral modification therapy today.  ASSESS: Sintia has the diagnosis of obesity and her BMI today is 45.18 Auriah is in the action stage of change   ADVISE: Ivonne was educated on the multiple health risks of obesity as well as the benefit of weight loss to improve her health. She was advised  of the need for long term treatment and the importance of lifestyle modifications to improve her current health and to decrease her risk of future health problems.  AGREE: Multiple dietary modification options and treatment options were discussed and  Jaziya agreed to follow the recommendations documented in the above note.  ARRANGE: Braelin was educated on the importance of frequent visits to treat obesity as outlined per CMS and USPSTF guidelines and agreed to schedule her next follow up appointment today.  Corey Skains, am acting as Location manager for [employer]. Owens Shark, DO  I have reviewed the above documentation for accuracy and completeness, and I agree with the above. -Jearld Lesch, DO

## 2018-10-21 LAB — INSULIN, RANDOM: INSULIN: 12.3 u[IU]/mL (ref 2.6–24.9)

## 2018-10-21 LAB — COMPREHENSIVE METABOLIC PANEL
ALT: 13 IU/L (ref 0–32)
AST: 16 IU/L (ref 0–40)
Albumin/Globulin Ratio: 1.5 (ref 1.2–2.2)
Albumin: 3.8 g/dL (ref 3.8–4.8)
Alkaline Phosphatase: 77 IU/L (ref 39–117)
BUN/Creatinine Ratio: 16 (ref 9–23)
BUN: 14 mg/dL (ref 6–20)
Bilirubin Total: 0.7 mg/dL (ref 0.0–1.2)
CO2: 20 mmol/L (ref 20–29)
Calcium: 9.1 mg/dL (ref 8.7–10.2)
Chloride: 104 mmol/L (ref 96–106)
Creatinine, Ser: 0.88 mg/dL (ref 0.57–1.00)
GFR calc Af Amer: 96 mL/min/{1.73_m2} (ref >59)
GFR calc non Af Amer: 84 mL/min/{1.73_m2} (ref >59)
Globulin, Total: 2.6 g/dL (ref 1.5–4.5)
Glucose: 127 mg/dL — ABNORMAL HIGH (ref 65–99)
Potassium: 4.8 mmol/L (ref 3.5–5.2)
Sodium: 142 mmol/L (ref 134–144)
Total Protein: 6.4 g/dL (ref 6.0–8.5)

## 2018-10-21 LAB — HEMOGLOBIN A1C
Est. average glucose Bld gHb Est-mCnc: 131 mg/dL
Hgb A1c MFr Bld: 6.2 % — ABNORMAL HIGH (ref 4.8–5.6)

## 2018-10-21 LAB — VITAMIN D 25 HYDROXY (VIT D DEFICIENCY, FRACTURES): Vit D, 25-Hydroxy: 57.8 ng/mL (ref 30.0–100.0)

## 2018-10-23 ENCOUNTER — Ambulatory Visit (INDEPENDENT_AMBULATORY_CARE_PROVIDER_SITE_OTHER): Payer: BC Managed Care – PPO | Admitting: Psychology

## 2018-10-23 DIAGNOSIS — F3289 Other specified depressive episodes: Secondary | ICD-10-CM

## 2018-10-26 ENCOUNTER — Ambulatory Visit: Payer: Self-pay | Admitting: Interventional Cardiology

## 2018-10-26 NOTE — Progress Notes (Deleted)
Cardiology Office Note:    Date:  10/26/2018   ID:  [name], DOB May 24, 1981, MRN [id number]  PCP:  Donald Prose, MD  Cardiologist:  Sinclair Grooms, MD   Referring MD: Donald Prose, MD   No chief complaint on file.   History of Present Illness:    [name] is a 38 y.o. female with a hx of DM II with elevated A1c, obesity status post lap band procedure in 2009, coronary artery disease status post inferior STEMI followed by CABG (LIMA to LAD, left radial to diagonal, SVG sequential to PDA and PL) in November 2018.   Past Medical History:  Diagnosis Date  . Anxiety   . Back pain   . Chest pain   . Coronary artery disease   . Diabetes mellitus   . Difficulty swallowing pills   . Fatigue   . GERD (gastroesophageal reflux disease)   . Hay fever   . Headache   . Heartburn   . History of heart attack   . HTN (hypertension)   . Hyperlipidemia   . Joint pain   . Knee pain   . Nervousness   . Palpitations   . Shortness of breath   . Shortness of breath on exertion   . Stress     Past Surgical History:  Procedure Laterality Date  . CORONARY ARTERY BYPASS GRAFT N/A 08/02/2017   Procedure: CORONARY ARTERY BYPASS GRAFTING (CABG) X 5 (LIMA to LAD, LEFT RADIAL ARTERY to DIAGONAL, SVG to SEQUENTIALLY PDA and PLB) , USING LEFT INTERNAL MAMMARY ARTERY, LEFT RADIAL ARTERY, AND  SAPHENOUS VEIN to [place], RIGHT GREATER SAPHENOUS VEIN HARVESTED ENDOSCOPICALLY;  Surgeon: Grace Isaac, MD;  Location: [facility];  Service: Open Heart Surgery;  Laterality: N/A;  . CORONARY/GRAFT ACUTE MI REVASCULARIZATION N/A 07/24/2017   Procedure: Coronary/Graft Acute MI Revascularization;  Surgeon: Belva Crome, MD;  Location: [facility];  Service: Cardiovascular;  Laterality: N/A;  . LAPAROSCOPIC GASTRIC BANDING  09/02/2008  . LEFT HEART CATH AND CORONARY ANGIOGRAPHY N/A 07/24/2017   Procedure: LEFT HEART CATH AND CORONARY ANGIOGRAPHY;  Surgeon: Belva Crome, MD;  Location:  [facility];  Service: Cardiovascular;  Laterality: N/A;  . RADIAL ARTERY HARVEST Left 08/02/2017   Procedure: LEFT RADIAL ARTERY HARVEST;  Surgeon: Grace Isaac, MD;  Location: [facility];  Service: Open Heart Surgery;  Laterality: Left;  . STERNAL CLOSURE N/A 08/02/2017   Procedure: STERNAL PLATING;  Surgeon: Grace Isaac, MD;  Location: [facility] [place] [facility];  Service: Open Heart Surgery;  Laterality: N/A;  . [place]   had metal removed from stomach as a child  . TEE WITHOUT CARDIOVERSION N/A 08/02/2017   Procedure: TRANSESOPHAGEAL ECHOCARDIOGRAM (TEE);  Surgeon: Grace Isaac, MD;  Location: [facility];  Service: Open Heart Surgery;  Laterality: N/A;    Current Medications: No outpatient medications have been marked as taking for the 10/26/18 encounter (Appointment) with Belva Crome, MD.     Allergies:   Patient has no known allergies.   Social History   Socioeconomic History  . Marital status: Significant Other    Spouse name: Zareena Willis  . Number of children: Not on file  . Years of education: Not on file  . Highest education level: Not on file  Occupational History  . Occupation: Dealer: [employer]  . Financial resource strain: Not on file  . Food insecurity:  Worry: Not on file    Inability: Not on file  . Transportation needs:    Medical: Not on file    Non-medical: Not on file  Tobacco Use  . Smoking status: Never Smoker  . Smokeless tobacco: Never Used  Substance and Sexual Activity  . Alcohol use: Yes    Comment: occ  . Drug use: No  . Sexual activity: Not on file  Lifestyle  . Physical activity:    Days per week: Not on file    Minutes per session: Not on file  . Stress: Not on file  Relationships  . Social connections:    Talks on phone: Not on file    Gets together: Not on file    Attends religious service: Not on file    Active member of club or organization: Not on file     Attends meetings of clubs or organizations: Not on file    Relationship status: Not on file  Other Topics Concern  . Not on file  Social History Narrative  . Not on file     Family History: The patient's family history includes Cancer in her maternal grandfather and maternal grandmother; Diabetes in her father; Heart disease in her father; Hyperlipidemia in her mother; Hypertension in her father and mother; Obesity in her mother; Sudden death in her father.  ROS:   Please see the history of present illness.    *** All other systems reviewed and are negative.  EKGs/Labs/Other Studies Reviewed:    The following studies were reviewed today: ***  EKG:  EKG ***  Recent Labs: 06/12/2018: TSH 1.560 10/19/2018: ALT 13; BUN 14; Creatinine, Ser 0.88; Potassium 4.8; Sodium 142  Recent Lipid Panel    Component Value Date/Time   CHOL 112 06/12/2018 0808   TRIG 66 06/12/2018 0808   HDL 37 (L) 06/12/2018 0808   CHOLHDL 2.7 03/16/2018 0936   CHOLHDL 3.6 07/26/2017 0250   VLDL 25 07/26/2017 0250   LDLCALC 62 06/12/2018 0808    Physical Exam:    VS:  There were no vitals taken for this visit.    Wt Readings from Last 3 Encounters:  10/19/18 255 lb (115.7 kg)  09/27/18 255 lb (115.7 kg)  09/04/18 256 lb (116.1 kg)     GEN: ***. No acute distress HEENT: Normal NECK: No JVD. LYMPHATICS: No lymphadenopathy CARDIAC: ***RRR.  *** murmur, ***gallop, ***edema VASCULAR: *** Pulses, *** bruits RESPIRATORY:  Clear to auscultation without rales, wheezing or rhonchi  ABDOMEN: Soft, non-tender, non-distended, No pulsatile mass, MUSCULOSKELETAL: No deformity  SKIN: Warm and dry NEUROLOGIC:  Alert and oriented x 3 PSYCHIATRIC:  Normal affect   ASSESSMENT:    1. Coronary artery disease involving coronary bypass graft of native heart with angina pectoris ([place])   2. Essential hypertension   3. Other hyperlipidemia   4. Morbid obesity ([place])   5. Diabetes mellitus type 2 in obese [facility])     PLAN:    In order of problems listed above:  1. ***   Medication Adjustments/Labs and Tests Ordered: Current medicines are reviewed at length with the patient today.  Concerns regarding medicines are outlined above.  No orders of the defined types were placed in this encounter.  No orders of the defined types were placed in this encounter.   There are no Patient Instructions on file for this visit.   Signed, Sinclair Grooms, MD  10/26/2018 9:11 AM    [place]

## 2018-11-06 ENCOUNTER — Ambulatory Visit (INDEPENDENT_AMBULATORY_CARE_PROVIDER_SITE_OTHER): Payer: Self-pay | Admitting: Bariatrics

## 2018-11-08 ENCOUNTER — Ambulatory Visit (INDEPENDENT_AMBULATORY_CARE_PROVIDER_SITE_OTHER): Payer: BC Managed Care – PPO | Admitting: Psychology

## 2018-11-08 DIAGNOSIS — F3289 Other specified depressive episodes: Secondary | ICD-10-CM

## 2018-11-08 NOTE — Progress Notes (Signed)
Office: [phone]  /  Fax: [phone]    Date: November 08, 2018   Time Seen: 4:35pm Duration: 26 minutes Provider: Glennie Isle, Psy.D. Type of Session: Individual Therapy  Type of Contact: Face-to-face  Session Content: [name] is a 38 y.o. female presenting for a follow-up appointment to address the previously established treatment goal of decreasing emotional eating. The session was initiated with the administration of the PHQ-9 and GAD-7, as well as a brief check-in. [name] reported feeling tired since the last appointment. She was encouraged to reach out to her PCP or Dr. Owens Shark should it persist; she agreed. Regarding eating, [name] reported, "I am trying to recover from the weekend. I will be honest, it was not good." She discussed going out to eat on Valentine's Day as well as consumed cookies. She noted, "It messed me up real bad." Regarding mindfulness, she discussed practicing it. This provider discussed the utilization of YouTube for mindfulness exercises. Based on [name]'s self-report about doing "bad," psychoeducation regarding common cognitive distortions associated with emotional eating was provided. [name] was provided a handout outlining the aforementioned.This provider discussed the utilization of mindfulness to help cope with the aforementioned. Furthermore, this provider discussed termination planning, including the option for a referral for longer-term therapeutic services. [name] was receptive to today's session as evidenced by openness to sharing, responsiveness to feedback, and willingness to continue practicing mindfulness  Mental Status Examination: [name] arrived on time for the appointment. She presented as appropriately dressed and groomed. [name] appeared her stated age and demonstrated adequate orientation to time, place, person, and purpose of the appointment. She also demonstrated appropriate eye contact. No psychomotor abnormalities or behavioral  peculiarities noted. Her mood was euthymic with congruent affect. Her thought processes were logical, linear, and goal-directed. No hallucinations, delusions, bizarre thinking or behavior reported or observed. Judgment, insight, and impulse control appeared to be grossly intact. There was no evidence of paraphasias (i.e., errors in speech, gross mispronunciations, and word substitutions), repetition deficits, or disturbances in volume or prosody (i.e., rhythm and intonation). There was no evidence of attention or memory impairments. [name] denied current suicidal and homicidal ideation, plan and intent.   Structured Assessment Results: The Patient Health Questionnaire-9 (PHQ-9) is a self-report measure that assesses symptoms and severity of depression over the course of the last two weeks. [name] obtained a score of 1 suggesting minimal depression. [name] finds the endorsed symptoms to be not difficult at all. Depression screen [facility] 2/9 11/08/2018  Decreased Interest 0  Down, Depressed, Hopeless 0  PHQ - 2 Score 0  Altered sleeping 0  Tired, decreased energy 1  Change in appetite 0  Feeling bad or failure about yourself  0  Trouble concentrating 0  Moving slowly or fidgety/restless 0  Suicidal thoughts 0  PHQ-9 Score 1  Difficult doing work/chores -   The Generalized Anxiety Disorder-7 (GAD-7) is a brief self-report measure that assesses symptoms of anxiety over the course of the last two weeks. [name] obtained a score of zero. GAD 7 : Generalized Anxiety Score 11/08/2018  Nervous, Anxious, on Edge 0  Control/stop worrying 0  Worry too much - different things 0  Trouble relaxing 0  Restless 0  Easily annoyed or irritable 0  Afraid - awful might happen 0  Total GAD 7 Score 0   Interventions:  Administration of PHQ-9 and GAD-7 for symptom monitoring Review of content from the previous session Empathic reflections and validation Psychoeducation regarding cognitive distortions    Termination planning Discussed option for  a referral for longer-term therapeutic services Positive reinforcement Brief chart review  DSM-5 Diagnosis: 311 (F32.8) Other Specified Depressive Disorder, Emotional Eating  Treatment Goal & Progress: During the initial appointment with this provider, the following treatment goal was established: decrease emotional eating. [name] has demonstrated progress in her goal as evidenced by increased awareness of hunger patterns and triggers for emotional eating. [name] demonstrated willingness to continue engaging in learned skills.   Plan: [name] continues to appear able and willing to participate as evidenced by engagement in reciprocal conversation, and asking questions for clarification as appropriate. The next appointment will be scheduled in two weeks. The next session will focus on the introduction of thought defusion.

## 2018-11-16 ENCOUNTER — Encounter (INDEPENDENT_AMBULATORY_CARE_PROVIDER_SITE_OTHER): Payer: Self-pay | Admitting: Bariatrics

## 2018-11-16 ENCOUNTER — Ambulatory Visit (INDEPENDENT_AMBULATORY_CARE_PROVIDER_SITE_OTHER): Payer: BC Managed Care – PPO | Admitting: Bariatrics

## 2018-11-16 VITALS — BP 133/92 | HR 66 | Temp 98.4°F | Ht 63.0 in | Wt 254.0 lb

## 2018-11-16 DIAGNOSIS — I10 Essential (primary) hypertension: Secondary | ICD-10-CM

## 2018-11-16 DIAGNOSIS — E559 Vitamin D deficiency, unspecified: Secondary | ICD-10-CM | POA: Diagnosis not present

## 2018-11-16 DIAGNOSIS — Z6841 Body Mass Index (BMI) 40.0 and over, adult: Secondary | ICD-10-CM

## 2018-11-16 DIAGNOSIS — F3289 Other specified depressive episodes: Secondary | ICD-10-CM | POA: Diagnosis not present

## 2018-11-16 DIAGNOSIS — E119 Type 2 diabetes mellitus without complications: Secondary | ICD-10-CM

## 2018-11-16 DIAGNOSIS — Z9189 Other specified personal risk factors, not elsewhere classified: Secondary | ICD-10-CM | POA: Diagnosis not present

## 2018-11-16 MED ORDER — VITAMIN D (ERGOCALCIFEROL) 1.25 MG (50000 UNIT) PO CAPS: 50000.0000 [IU] | ORAL_CAPSULE | ORAL | 0 refills | Status: DC

## 2018-11-20 DIAGNOSIS — E559 Vitamin D deficiency, unspecified: Secondary | ICD-10-CM | POA: Insufficient documentation

## 2018-11-20 HISTORY — DX: Vitamin D deficiency, unspecified: E55.9

## 2018-11-20 NOTE — Progress Notes (Signed)
Office: [phone]  /  Fax: [phone]   HPI:   Chief Complaint: OBESITY [name] is here to discuss her progress with her obesity treatment plan. She is on the Category 2 plan with Category 1 and  Category 2 breakfast options. She is following her eating plan approximately 70 % of the time. She states she is doing Zumba for 50 to 60 minutes 2 times per week. [name] is doing well overall. "It was rough around the holiday". Her weight is 254 lb (115.2 kg) today and has had a weight loss of 1 pound over a period of 4 weeks since her last visit. She has lost 6 lbs since starting treatment with [name].  Diabetes II [name] has a diagnosis of diabetes type II. She is taking Trulicity and Metformin. [name] states fasting BGs range between 110 and 120 and she is not checking 2 hour post prandial BGs. She denies any hypoglycemic episodes. Last A1c was at 6.2 and last insulin level was at 16.3. She has been working on intensive lifestyle modifications including diet, exercise, and weight loss to help control her blood glucose levels.  Vitamin D deficiency [name] has a diagnosis of vitamin D deficiency. She is currently taking vit D and denies nausea, vomiting or muscle weakness.  At risk for osteopenia and osteoporosis [name] is at higher risk of osteopenia and osteoporosis due to vitamin D deficiency.   Hypertension [name] is a 38 y.o. female with hypertension. She is taking Lopressor and Benicar. [name] denies chest pain or shortness of breath on exertion. She is working weight loss to help control her blood pressure with the goal of decreasing her risk of heart attack and stroke. Candices blood pressure is not well controlled today.  Depression with emotional eating behaviors [name] is struggling with emotional eating and using food for comfort to the extent that it is negatively impacting her health. She often snacks when she is not hungry. [name] sometimes feels she  is out of control and then feels guilty that she made poor food choices. She has been working on behavior modification techniques to help reduce her emotional eating and has been somewhat successful. She shows no sign of suicidal or homicidal ideations.  Depression screen [facility] 2/9 11/08/2018 10/23/2018 10/09/2018 07/17/2018 06/08/2018  Decreased Interest 0 0 0 0 3  Down, Depressed, Hopeless 0 0 0 0 1  PHQ - 2 Score 0 0 0 0 4  Altered sleeping 0 0 0 0 1  Tired, decreased energy 1 0 0 0 3  Change in appetite 0 0 0 0 2  Feeling bad or failure about yourself  0 0 0 0 3  Trouble concentrating 0 0 0 0 1  Moving slowly or fidgety/restless 0 0 0 0 2  Suicidal thoughts 0 0 0 0 0  PHQ-9 Score 1 0 0 0 16  Difficult doing work/chores - - - - Not difficult at all    ASSESSMENT AND PLAN:  Type 2 diabetes mellitus without complication, without long-term current use of insulin (HCC)  Essential hypertension  Vitamin D deficiency - Plan: Vitamin D, Ergocalciferol, [name]  Other depression - with emotional eating  At risk for osteoporosis  Class 3 severe obesity with serious comorbidity and body mass index (BMI) of 45.0 to 49.9 in adult, unspecified obesity type (HCC)  PLAN:  Diabetes II Adlai has been given extensive diabetes education by myself today including ideal fasting and post-prandial blood  glucose readings, individual ideal Hgb A1c goals and hypoglycemia prevention. We discussed the importance of good blood sugar control to decrease the likelihood of diabetic complications such as nephropathy, neuropathy, limb loss, blindness, coronary artery disease, and death. We discussed the importance of intensive lifestyle modification including diet, exercise and weight loss as the first line treatment for diabetes. Lakasha agrees to continue her diabetes medications and will follow up at the agreed upon time.  Vitamin D Deficiency Kortney was informed that low  vitamin D levels contributes to fatigue and are associated with obesity, breast, and colon cancer. She agrees to continue to take prescription Vit D @50 ,000 IU every week #4 with no refills and will follow up for routine testing of vitamin D, at least 2-3 times per year. She was informed of the risk of over-replacement of vitamin D and agrees to not increase her dose unless she discusses this with [name] first. Celisa agrees to follow up as directed.  At risk for osteopenia and osteoporosis Advika was given extended  (15 minutes) osteoporosis prevention counseling today. Winola is at risk for osteopenia and osteoporosis due to her vitamin D deficiency. She was encouraged to take her vitamin D and follow her higher calcium diet and increase strengthening exercise to help strengthen her bones and decrease her risk of osteopenia and osteoporosis.  Hypertension We discussed sodium restriction, working on healthy weight loss, and a regular exercise program as the means to achieve improved blood pressure control. Maybree agreed with this plan and agreed to follow up as directed. We will continue to monitor her blood pressure as well as her progress with the above lifestyle modifications. She will continue her medications as prescribed and will watch for signs of hypotension as she continues her lifestyle modifications.  Depression with Emotional Eating Behaviors We discussed behavior modification techniques today to help [name] deal with her emotional eating and depression. [name] will continue to see Dr. Mallie Mussel.  Obesity [name] is currently in the action stage of change. As such, her goal is to continue with weight loss efforts She has agreed to follow the Category 2 plan with Category 1 and Category 2 breakfast options [name] has been instructed to work up to a goal of 150 minutes of combined cardio and strengthening exercise per week for weight loss and overall health benefits. We discussed the  following Behavioral Modification Strategies today: increase H2O intake, no skipping meals, keeping healthy foods in the home, increasing lean protein intake, decreasing simple carbohydrates, increasing vegetables, decreasing sodium intake, work on meal planning and mindful eating and decrease liquid calories Additional lunch options were given to patient today.  [name] has agreed to follow up with our clinic in 2 weeks. She was informed of the importance of frequent follow up visits to maximize her success with intensive lifestyle modifications for her multiple health conditions.  ALLERGIES: No Known Allergies  MEDICATIONS: Current Outpatient Medications on File Prior to Visit  Medication Sig Dispense Refill  . acetaminophen (TYLENOL) 500 MG tablet Take 500 mg by mouth every 6 (six) hours as needed for mild pain or headache.    Marland Kitchen aspirin EC 81 MG EC tablet Take 1 tablet (81 mg total) daily by mouth.    Marland Kitchen atorvastatin (LIPITOR) 40 MG tablet Take 1 tablet (40 mg total) by mouth daily. 90 tablet 3  . clopidogrel (PLAVIX) 75 MG tablet Take 1 tablet (75 mg total) by mouth daily. 90 tablet 0  . Coenzyme Q10 (COQ10) 100 MG CAPS Take 100  mg by mouth daily.    . colchicine 0.6 MG tablet Take two (2) tablets by mouth at time of initial pain. Then take one (1) tablet by mouth twice daily as needed for gout.    . Dulaglutide (TRULICITY) 1.5 NG/2.9BM SOPN Inject 1 Dose into the skin once a week.    . metFORMIN (GLUCOPHAGE) 500 MG tablet Take 500 mg by mouth 2 (two) times daily with a meal.    . metoprolol tartrate (LOPRESSOR) 25 MG tablet Take 1 tablet (25 mg total) by mouth 2 (two) times daily. 180 tablet 2  . Misc Natural Products (DAILY HERBS BONE/JOINTS PO) Take 3-5 tablets daily by mouth. Insta flex    . olmesartan (BENICAR) 5 MG tablet Take 1 tablet (5 mg total) by mouth daily. 90 tablet 3   No current facility-administered medications on file prior to visit.     PAST MEDICAL HISTORY: Past  Medical History:  Diagnosis Date  . Anxiety   . Back pain   . Chest pain   . Coronary artery disease   . Diabetes mellitus   . Difficulty swallowing pills   . Fatigue   . GERD (gastroesophageal reflux disease)   . Hay fever   . Headache   . Heartburn   . History of heart attack   . HTN (hypertension)   . Hyperlipidemia   . Joint pain   . Knee pain   . Nervousness   . Palpitations   . Shortness of breath   . Shortness of breath on exertion   . Stress     PAST SURGICAL HISTORY: Past Surgical History:  Procedure Laterality Date  . CORONARY ARTERY BYPASS GRAFT N/A 08/02/2017   Procedure: CORONARY ARTERY BYPASS GRAFTING (CABG) X 5 (LIMA to LAD, LEFT RADIAL ARTERY to DIAGONAL, SVG to SEQUENTIALLY PDA and PLB) , USING LEFT INTERNAL MAMMARY ARTERY, LEFT RADIAL ARTERY, AND  SAPHENOUS VEIN to [place], RIGHT GREATER SAPHENOUS VEIN HARVESTED ENDOSCOPICALLY;  Surgeon: Grace Isaac, MD;  Location: [facility];  Service: Open Heart Surgery;  Laterality: N/A;  . CORONARY/GRAFT ACUTE MI REVASCULARIZATION N/A 07/24/2017   Procedure: Coronary/Graft Acute MI Revascularization;  Surgeon: Belva Crome, MD;  Location: [place] [facility];  Service: Cardiovascular;  Laterality: N/A;  . LAPAROSCOPIC GASTRIC BANDING  09/02/2008  . LEFT HEART CATH AND CORONARY ANGIOGRAPHY N/A 07/24/2017   Procedure: LEFT HEART CATH AND CORONARY ANGIOGRAPHY;  Surgeon: Belva Crome, MD;  Location: [place] [facility];  Service: Cardiovascular;  Laterality: N/A;  . RADIAL ARTERY HARVEST Left 08/02/2017   Procedure: LEFT RADIAL ARTERY HARVEST;  Surgeon: Grace Isaac, MD;  Location: [facility];  Service: Open Heart Surgery;  Laterality: Left;  . STERNAL CLOSURE N/A 08/02/2017   Procedure: STERNAL PLATING;  Surgeon: Grace Isaac, MD;  Location: [facility];  Service: Open Heart Surgery;  Laterality: N/A;  . [place]   had metal removed from stomach as a child  . TEE WITHOUT CARDIOVERSION N/A 08/02/2017    Procedure: TRANSESOPHAGEAL ECHOCARDIOGRAM (TEE);  Surgeon: Grace Isaac, MD;  Location: [facility];  Service: Open Heart Surgery;  Laterality: N/A;    SOCIAL HISTORY: Social History   Tobacco Use  . Smoking status: Never Smoker  . Smokeless tobacco: Never Used  Substance Use Topics  . Alcohol use: Yes    Comment: occ  . Drug use: No    FAMILY HISTORY: Family History  Problem Relation Age of Onset  . Diabetes Father   .  Hypertension Father   . Heart disease Father   . Sudden death Father   . Hyperlipidemia Mother   . Hypertension Mother   . Obesity Mother   . Cancer Maternal Grandmother        lung  . Cancer Maternal Grandfather        prostate    ROS: Review of Systems  Constitutional: Positive for weight loss.  Respiratory: Negative for shortness of breath (on exertion).   Cardiovascular: Negative for chest pain.  Gastrointestinal: Negative for nausea and vomiting.  Musculoskeletal:       Negative for muscle weakness  Psychiatric/Behavioral: Positive for depression. Negative for suicidal ideas.    PHYSICAL EXAM: Blood pressure (!) 133/92, pulse 66, temperature 98.4 F (36.9 C), temperature source Oral, height 5\' 3"  (1.6 m), weight 254 lb (115.2 kg), SpO2 98 %. Body mass index is 44.99 kg/m. Physical Exam Vitals signs reviewed.  Constitutional:      Appearance: Normal appearance. She is well-developed. She is obese.  Cardiovascular:     Rate and Rhythm: Normal rate.  Pulmonary:     Effort: Pulmonary effort is normal.  Musculoskeletal: Normal range of motion.  Skin:    General: Skin is warm and dry.  Neurological:     Mental Status: She is alert and oriented to person, place, and time.  Psychiatric:        Mood and Affect: Mood normal.        Behavior: Behavior normal.        Thought Content: Thought content does not include homicidal or suicidal ideation.     RECENT LABS AND TESTS: BMET    Component Value Date/Time   NA 142 10/19/2018 0839   K  4.8 10/19/2018 0839   CL 104 10/19/2018 0839   CO2 20 10/19/2018 0839   GLUCOSE 127 (H) 10/19/2018 0839   GLUCOSE 150 (H) 08/08/2017 0348   BUN 14 10/19/2018 0839   CREATININE 0.88 10/19/2018 0839   CALCIUM 9.1 10/19/2018 0839   GFRNONAA 84 10/19/2018 0839   GFRAA 96 10/19/2018 0839   Lab Results  Component Value Date   HGBA1C 6.2 (H) 10/19/2018   HGBA1C 6.2 (H) 06/12/2018   HGBA1C 7.1 (H) 08/01/2017   HGBA1C 7.4 (H) 07/25/2017   HGBA1C 7.4 (H) 07/24/2017   Lab Results  Component Value Date   INSULIN 12.3 10/19/2018   INSULIN 16.3 06/12/2018   CBC    Component Value Date/Time   WBC 6.8 09/14/2017 0949   WBC 9.6 08/08/2017 0348   RBC 5.08 09/14/2017 0949   RBC 2.89 (L) 08/08/2017 0348   HGB 13.3 09/14/2017 0949   HCT 39.9 09/14/2017 0949   PLT 320 09/14/2017 0949   MCV 79 09/14/2017 0949   MCH 26.2 (L) 09/14/2017 0949   MCH 27.3 08/08/2017 0348   MCHC 33.3 09/14/2017 0949   MCHC 33.3 08/08/2017 0348   RDW 13.8 09/14/2017 0949   LYMPHSABS 1.8 07/24/2017 0518   MONOABS 0.7 07/24/2017 0518   EOSABS 0.1 07/24/2017 0518   BASOSABS 0.0 07/24/2017 0518   Iron/TIBC/Ferritin/ %Sat No results found for: IRON, TIBC, FERRITIN, IRONPCTSAT Lipid Panel     Component Value Date/Time   CHOL 112 06/12/2018 0808   TRIG 66 06/12/2018 0808   HDL 37 (L) 06/12/2018 0808   CHOLHDL 2.7 03/16/2018 0936   CHOLHDL 3.6 07/26/2017 0250   VLDL 25 07/26/2017 0250   LDLCALC 62 06/12/2018 0808   Hepatic Function Panel     Component Value Date/Time  PROT 6.4 10/19/2018 0839   ALBUMIN 3.8 10/19/2018 0839   AST 16 10/19/2018 0839   ALT 13 10/19/2018 0839   ALKPHOS 77 10/19/2018 0839   BILITOT 0.7 10/19/2018 0839   BILIDIR 0.14 03/16/2018 0936      Component Value Date/Time   TSH 1.560 06/12/2018 0808    Ref. Range 10/19/2018 08:39  Vitamin D, 25-Hydroxy Latest Ref Range: 30.0 - 100.0 ng/mL 57.8     OBESITY BEHAVIORAL INTERVENTION VISIT  Today's visit was # 9   Starting  weight: 260 lbs Starting date: 06/08/2018 Today's weight : 254 lbs  Today's date: 11/16/2018 Total lbs lost to date: 6    11/16/2018  Height 5\' 3"  (1.6 m)  Weight 254 lb (115.2 kg)  BMI (Calculated) 45.01  BLOOD PRESSURE - SYSTOLIC 295  BLOOD PRESSURE - DIASTOLIC 92   Body Fat % 28.4 %  Total Body Water (lbs) 90 lbs    ASK: We discussed the diagnosis of obesity with [name] today and Barb agreed to give [name] permission to discuss obesity behavioral modification therapy today.  ASSESS: Derika has the diagnosis of obesity and her BMI today is 45.01 Delanna is in the action stage of change   ADVISE: Miamor was educated on the multiple health risks of obesity as well as the benefit of weight loss to improve her health. She was advised of the need for long term treatment and the importance of lifestyle modifications to improve her current health and to decrease her risk of future health problems.  AGREE: Multiple dietary modification options and treatment options were discussed and  Aleksandra agreed to follow the recommendations documented in the above note.  ARRANGE: Patrena was educated on the importance of frequent visits to treat obesity as outlined per CMS and USPSTF guidelines and agreed to schedule her next follow up appointment today.  Corey Skains, am acting as Location manager for [employer]. Owens Shark, DO  I have reviewed the above documentation for accuracy and completeness, and I agree with the above. -Jearld Lesch, DO

## 2018-11-20 NOTE — Progress Notes (Signed)
Office: [phone]  /  Fax: [phone]    Date: November 23, 2018   Time Seen: 4:00pm Duration: 22 minutes Provider: Glennie Isle, Psy.D. Type of Session: Individual Therapy  Type of Contact: Face-to-face  Session Content: Blaze is a 38 y.o. female presenting for a follow-up appointment to address the previously established treatment goal of decreasing emotional eating. The session was initiated with the administration of the PHQ-9 and GAD-7, as well as a brief check-in. Liisa shared about ongoing work related stressors, and noted feeling tired. Regarding eating, Gelila shared, "It's been pretty good." She added, "I haven't exercised." She denied episodes of emotional eating in the past week, but could not recall if she has any during the week prior. Session then focused on reviewing previously discussed cognitive distortions. She identified "the categorizer" being frequent, but denied experiencing it in the past couple of weeks. Psychoeducation regarding thought defusion, including its impact on emotional eating was provided. Deeanna was led through a thought defusion exercise, and a handout with various exercises was provided. Juan was encouraged to engage in the thought defusion exercises between now and the next appointment with this provider. Rebeka agreed. For the thought defusion exercise, Christy utilized the thought, "I am not good enough." Following the exercise, Verenis noted, "The first step was the worst. It was awful." As the exercise progressed, she indicated she felt bad, but "it got a little bit better." She was engaged in two additional brief exercises, and was observed laughing. Verley was receptive to today's session as evidenced by openness to sharing, responsiveness to feedback, and engagement in a thought defusion exercise.  Mental Status Examination: Trisha arrived on time for the appointment. She presented as appropriately dressed and groomed. Selina appeared her  stated age and demonstrated adequate orientation to time, place, person, and purpose of the appointment. She also demonstrated appropriate eye contact. No psychomotor abnormalities or behavioral peculiarities noted. Her mood was euthymic with congruent affect. Her thought processes were logical, linear, and goal-directed. No hallucinations, delusions, bizarre thinking or behavior reported or observed. Judgment, insight, and impulse control appeared to be grossly intact. There was no evidence of paraphasias (i.e., errors in speech, gross mispronunciations, and word substitutions), repetition deficits, or disturbances in volume or prosody (i.e., rhythm and intonation). There was no evidence of attention or memory impairments. Kharizma denied current suicidal and homicidal ideation, plan and intent.   Structured Assessment Results: The Patient Health Questionnaire-9 (PHQ-9) is a self-report measure that assesses symptoms and severity of depression over the course of the last two weeks. Paulita obtained a score of 1 suggesting minimal depression. Calea finds the endorsed symptoms to be not difficult at all. Depression screen [facility] 2/9 11/23/2018  Decreased Interest 0  Down, Depressed, Hopeless 0  PHQ - 2 Score 0  Altered sleeping 0  Tired, decreased energy 1  Change in appetite 0  Feeling bad or failure about yourself  0  Trouble concentrating 0  Moving slowly or fidgety/restless 0  Suicidal thoughts 0  PHQ-9 Score 1  Difficult doing work/chores -   The Generalized Anxiety Disorder-7 (GAD-7) is a brief self-report measure that assesses symptoms of anxiety over the course of the last two weeks. Kessa obtained a score of zero. GAD 7 : Generalized Anxiety Score 11/23/2018  Nervous, Anxious, on Edge 0  Control/stop worrying 0  Worry too much - different things 0  Trouble relaxing 0  Restless 0  Easily annoyed or irritable 0  Afraid - awful might happen 0  Total GAD 7 Score 0   Interventions:    Administration of PHQ-9 and GAD-7 for symptom monitoring Review of content from the previous session Empathic reflections and validation Psychoeducation regarding thought defusion Thought defusion exercise Positive reinforcement Brief chart review  DSM-5 Diagnosis: 311 (F32.8) Other Specified Depressive Disorder, Emotional Eating  Treatment Goal & Progress: During the initial appointment with this provider, the following treatment goal was established: decrease emotional eating. Brieana has demonstrated progress in her goal as evidenced by increased awareness of hunger patterns and triggers for emotional eating. Also, there appears to be an overall reduction in emotional eating since the onset of treatment with this provider.  Moreover, Jayle continues to demonstrate willingness to engage in learned skills.  Plan: Yuliana continues to appear able and willing to participate as evidenced by engagement in reciprocal conversation, and asking questions for clarification as appropriate. The next appointment will be scheduled in three to four weeks, as that will provide Kymiah opportunity to engage in learned skills and then follow-up with this provider. The next session will focus on reviewing learned skills, and possible termination.

## 2018-11-23 ENCOUNTER — Ambulatory Visit (INDEPENDENT_AMBULATORY_CARE_PROVIDER_SITE_OTHER): Payer: BC Managed Care – PPO | Admitting: Psychology

## 2018-11-23 DIAGNOSIS — F3289 Other specified depressive episodes: Secondary | ICD-10-CM

## 2018-11-27 ENCOUNTER — Other Ambulatory Visit: Payer: Self-pay

## 2018-11-27 MED ORDER — CLOPIDOGREL BISULFATE 75 MG PO TABS: 75.0000 mg | ORAL_TABLET | Freq: Every day | ORAL | 0 refills | Status: DC

## 2018-11-30 ENCOUNTER — Ambulatory Visit: Payer: BC Managed Care – PPO | Admitting: Interventional Cardiology

## 2018-11-30 ENCOUNTER — Encounter: Payer: Self-pay | Admitting: Interventional Cardiology

## 2018-11-30 ENCOUNTER — Encounter

## 2018-11-30 ENCOUNTER — Other Ambulatory Visit: Payer: Self-pay

## 2018-11-30 VITALS — BP 112/72 | HR 71 | Ht 63.0 in | Wt 259.4 lb

## 2018-11-30 DIAGNOSIS — E1169 Type 2 diabetes mellitus with other specified complication: Secondary | ICD-10-CM | POA: Diagnosis not present

## 2018-11-30 DIAGNOSIS — I25709 Atherosclerosis of coronary artery bypass graft(s), unspecified, with unspecified angina pectoris: Secondary | ICD-10-CM

## 2018-11-30 DIAGNOSIS — I10 Essential (primary) hypertension: Secondary | ICD-10-CM | POA: Diagnosis not present

## 2018-11-30 DIAGNOSIS — E669 Obesity, unspecified: Secondary | ICD-10-CM

## 2018-11-30 DIAGNOSIS — E7800 Pure hypercholesterolemia, unspecified: Secondary | ICD-10-CM

## 2018-11-30 NOTE — Progress Notes (Signed)
Cardiology Office Note:    Date:  11/30/2018   ID:  [name], DOB 1981/01/25, MRN [id number]  PCP:  Donald Prose, MD  Cardiologist:  Sinclair Grooms, MD   Referring MD: Donald Prose, MD   Chief Complaint  Patient presents with  . Coronary Artery Disease    History of Present Illness:    [name] is a 38 y.o. female with a hx of diabetes type II with elevated A1c, obesity status post lap band procedure in 2009, coronary artery disease status post inferior STEMI followed by CABG (LIMA to LAD, left radial to diagonal, SVG sequential to PDA and PL) in November 2018.   [name] is gotten married since the last time I saw her.  She is in a weight loss program.  She is still falling short of her exercise metric.  She is back at work.  She denies angina.  No medication side effects.  Denies dyspnea, orthopnea, PND, lower extremity swelling.  No episodes of tachycardia/A. Fib.   Past Medical History:  Diagnosis Date  . Anxiety   . Back pain   . Chest pain   . Coronary artery disease   . Diabetes mellitus   . Difficulty swallowing pills   . Fatigue   . GERD (gastroesophageal reflux disease)   . Hay fever   . Headache   . Heartburn   . History of heart attack   . HTN (hypertension)   . Hyperlipidemia   . Joint pain   . Knee pain   . Nervousness   . Palpitations   . Shortness of breath   . Shortness of breath on exertion   . Stress     Past Surgical History:  Procedure Laterality Date  . CORONARY ARTERY BYPASS GRAFT N/A 08/02/2017   Procedure: CORONARY ARTERY BYPASS GRAFTING (CABG) X 5 (LIMA to LAD, LEFT RADIAL ARTERY to DIAGONAL, SVG to SEQUENTIALLY PDA and PLB) , USING LEFT INTERNAL MAMMARY ARTERY, LEFT RADIAL ARTERY, AND  SAPHENOUS VEIN to [place], RIGHT GREATER SAPHENOUS VEIN HARVESTED ENDOSCOPICALLY;  Surgeon: Grace Isaac, MD;  Location: [place];  Service: Open Heart Surgery;  Laterality: N/A;  . CORONARY/GRAFT ACUTE MI REVASCULARIZATION N/A  07/24/2017   Procedure: Coronary/Graft Acute MI Revascularization;  Surgeon: Belva Crome, MD;  Location: [place] [facility];  Service: Cardiovascular;  Laterality: N/A;  . LAPAROSCOPIC GASTRIC BANDING  09/02/2008  . LEFT HEART CATH AND CORONARY ANGIOGRAPHY N/A 07/24/2017   Procedure: LEFT HEART CATH AND CORONARY ANGIOGRAPHY;  Surgeon: Belva Crome, MD;  Location: [place] [facility];  Service: Cardiovascular;  Laterality: N/A;  . RADIAL ARTERY HARVEST Left 08/02/2017   Procedure: LEFT RADIAL ARTERY HARVEST;  Surgeon: Grace Isaac, MD;  Location: [place];  Service: Open Heart Surgery;  Laterality: Left;  . STERNAL CLOSURE N/A 08/02/2017   Procedure: STERNAL PLATING;  Surgeon: Grace Isaac, MD;  Location: [place];  Service: Open Heart Surgery;  Laterality: N/A;  . [place]   had metal removed from stomach as a child  . TEE WITHOUT CARDIOVERSION N/A 08/02/2017   Procedure: TRANSESOPHAGEAL ECHOCARDIOGRAM (TEE);  Surgeon: Grace Isaac, MD;  Location: [place];  Service: Open Heart Surgery;  Laterality: N/A;    Current Medications: Current Meds  Medication Sig  . acetaminophen (TYLENOL) 500 MG tablet Take 500 mg by mouth every 6 (six) hours as needed for mild pain or headache.  Marland Kitchen aspirin EC 81 MG EC tablet Take  1 tablet (81 mg total) daily by mouth.  Marland Kitchen atorvastatin (LIPITOR) 40 MG tablet Take 1 tablet (40 mg total) by mouth daily.  . clopidogrel (PLAVIX) 75 MG tablet Take 1 tablet (75 mg total) by mouth daily.  . Coenzyme Q10 (COQ10) 100 MG CAPS Take 100 mg by mouth daily.  . colchicine 0.6 MG tablet Take two (2) tablets by mouth at time of initial pain. Then take one (1) tablet by mouth twice daily as needed for gout.  . Dulaglutide (TRULICITY) 1.5 KK/9.3GH SOPN Inject 1 Dose into the skin once a week.  . metFORMIN (GLUCOPHAGE) 500 MG tablet Take 500 mg by mouth 2 (two) times daily with a meal.  . metoprolol tartrate (LOPRESSOR) 25 MG tablet Take 1 tablet (25 mg total)  by mouth 2 (two) times daily.  . Misc Natural Products (DAILY HERBS BONE/JOINTS PO) Take 3-5 tablets daily by mouth. Insta flex  . olmesartan (BENICAR) 5 MG tablet Take 1 tablet (5 mg total) by mouth daily.  . Vitamin D, Ergocalciferol, [name] Take 1 capsule (50,000 Units total) by mouth every 7 (seven) days.     Allergies:   Patient has no known allergies.   Social History   Socioeconomic History  . Marital status: Significant Other    Spouse name: Mahari Vankirk  . Number of children: Not on file  . Years of education: Not on file  . Highest education level: Not on file  Occupational History  . Occupation: Dealer: [employer]  . Financial resource strain: Not on file  . Food insecurity:    Worry: Not on file    Inability: Not on file  . Transportation needs:    Medical: Not on file    Non-medical: Not on file  Tobacco Use  . Smoking status: Never Smoker  . Smokeless tobacco: Never Used  Substance and Sexual Activity  . Alcohol use: Yes    Comment: occ  . Drug use: No  . Sexual activity: Not on file  Lifestyle  . Physical activity:    Days per week: Not on file    Minutes per session: Not on file  . Stress: Not on file  Relationships  . Social connections:    Talks on phone: Not on file    Gets together: Not on file    Attends religious service: Not on file    Active member of club or organization: Not on file    Attends meetings of clubs or organizations: Not on file    Relationship status: Not on file  Other Topics Concern  . Not on file  Social History Narrative  . Not on file     Family History: The patient's family history includes Cancer in her maternal grandfather and maternal grandmother; Diabetes in her father; Heart disease in her father; Hyperlipidemia in her mother; Hypertension in her father and mother; Obesity in her mother; Sudden death in her father.  ROS:    Please see the history of present illness.    She developed left radial and sternal keloids that are unsightly.  This is been somewhat depressing to her.  All other systems reviewed and are negative.  EKGs/Labs/Other Studies Reviewed:    The following studies were reviewed today: No new functional data  EKG:  EKG performed June 08, 2018 demonstrating sinus rhythm with lateral and inferior wall Q waves.  Recent Labs: 06/12/2018: TSH 1.560 10/19/2018:  ALT 13; BUN 14; Creatinine, Ser 0.88; Potassium 4.8; Sodium 142  Recent Lipid Panel    Component Value Date/Time   CHOL 112 06/12/2018 0808   TRIG 66 06/12/2018 0808   HDL 37 (L) 06/12/2018 0808   CHOLHDL 2.7 03/16/2018 0936   CHOLHDL 3.6 07/26/2017 0250   VLDL 25 07/26/2017 0250   LDLCALC 62 06/12/2018 0808    Physical Exam:    VS:  BP 112/72   Pulse 71   Ht 5\' 3"  (1.6 m)   Wt 259 lb 6.4 oz (117.7 kg)   SpO2 97%   BMI 45.95 kg/m     Wt Readings from Last 3 Encounters:  11/30/18 259 lb 6.4 oz (117.7 kg)  11/16/18 254 lb (115.2 kg)  10/19/18 255 lb (115.7 kg)     GEN: Moderate obesity.. No acute distress HEENT: Normal NECK: No JVD. LYMPHATICS: No lymphadenopathy CARDIAC: RRR.  No murmur, no gallop, no edema VASCULAR: Absent left radial, 2+ right radial and carotid pulses, no bruits RESPIRATORY:  Clear to auscultation without rales, wheezing or rhonchi  ABDOMEN: Soft, non-tender, non-distended, No pulsatile mass, MUSCULOSKELETAL: No deformity  SKIN: Large keloid in the sternal incision and left radial. NEUROLOGIC:  Alert and oriented x 3 PSYCHIATRIC:  Normal affect   ASSESSMENT:    1. Coronary artery disease involving coronary bypass graft of native heart with angina pectoris ([place])   2. Diabetes mellitus type 2 in obese ([place])   3. Morbid obesity ([place])   4. Essential hypertension   5. Pure hypercholesterolemia with target low density lipoprotein (LDL) cholesterol less than 70 mg/dL    PLAN:    In order of  problems listed above:  1. Stable without angina.  Secondary risk prevention discussed in detail. 2. Target hemoglobin A1c less than 7.  Currently 6.2. 3. Increase activity and decrease caloric intake. 4. Target blood pressure 130/80 mmHg or less. 5. Target LDL less than 70.  Continue high intensity statin therapy.  .Overall education and awareness concerning primary/secondary risk prevention was discussed in detail: LDL less than 70, hemoglobin A1c less than 7, blood pressure target less than 130/80 mmHg, >150 minutes of moderate aerobic activity per week, avoidance of smoking, weight control (via diet and exercise), and continued surveillance/management of/for obstructive sleep apnea.  Clinical follow-up in 1 year.   Medication Adjustments/Labs and Tests Ordered: Current medicines are reviewed at length with the patient today.  Concerns regarding medicines are outlined above.  No orders of the defined types were placed in this encounter.  No orders of the defined types were placed in this encounter.   Patient Instructions  Medication Instructions:  Your physician recommends that you continue on your current medications as directed. Please refer to the Current Medication list given to you today.  If you need a refill on your cardiac medications before your next appointment, please call your pharmacy.   Lab work: None If you have labs (blood work) drawn today and your tests are completely normal, you will receive your results only by: Marland Kitchen MyChart Message (if you have MyChart) OR . A paper copy in the mail If you have any lab test that is abnormal or we need to change your treatment, we will call you to review the results.  Testing/Procedures: None  Follow-Up: At [facility], you and your health needs are our priority.  As part of our continuing mission to provide you with exceptional heart care, we have created designated Provider Care Teams.  These Care Teams  include your  primary Cardiologist (physician) and Advanced Practice Providers (APPs -  Physician Assistants and Nurse Practitioners) who all work together to provide you with the care you need, when you need it. You will need a follow up appointment in 12 months.  Please call our office 2 months in advance to schedule this appointment.  You may see Sinclair Grooms, MD or one of the following Advanced Practice Providers on your designated Care Team:   Truitt Merle, NP Cecilie Kicks, NP . Kathyrn Drown, NP  Any Other Special Instructions Will Be Listed Below (If Applicable).       Signed, Sinclair Grooms, MD  11/30/2018 1:49 PM    [facility]

## 2018-11-30 NOTE — Patient Instructions (Signed)

## 2018-12-05 ENCOUNTER — Encounter (INDEPENDENT_AMBULATORY_CARE_PROVIDER_SITE_OTHER): Payer: Self-pay | Admitting: Bariatrics

## 2018-12-05 ENCOUNTER — Ambulatory Visit (INDEPENDENT_AMBULATORY_CARE_PROVIDER_SITE_OTHER): Payer: BC Managed Care – PPO | Admitting: Bariatrics

## 2018-12-05 ENCOUNTER — Other Ambulatory Visit: Payer: Self-pay

## 2018-12-05 VITALS — BP 131/86 | HR 72 | Temp 98.4°F | Ht 63.0 in | Wt 253.0 lb

## 2018-12-05 DIAGNOSIS — Z9189 Other specified personal risk factors, not elsewhere classified: Secondary | ICD-10-CM

## 2018-12-05 DIAGNOSIS — E119 Type 2 diabetes mellitus without complications: Secondary | ICD-10-CM

## 2018-12-05 DIAGNOSIS — E559 Vitamin D deficiency, unspecified: Secondary | ICD-10-CM | POA: Diagnosis not present

## 2018-12-05 DIAGNOSIS — Z6841 Body Mass Index (BMI) 40.0 and over, adult: Secondary | ICD-10-CM

## 2018-12-05 MED ORDER — VITAMIN D (ERGOCALCIFEROL) 1.25 MG (50000 UNIT) PO CAPS: 50000.0000 [IU] | ORAL_CAPSULE | ORAL | 0 refills | Status: DC

## 2018-12-05 NOTE — Progress Notes (Signed)
Office: [phone]  /  Fax: [phone]   HPI:   Chief Complaint: OBESITY [name] is here to discuss her progress with her obesity treatment plan. She is on the Category 2 plan with Category 1 and Category 2 breakfast options and she is following her eating plan approximately 80 % of the time. She states she is exercising 0 minutes 0 times per week. [name] is doing well overall. She has attended some celebrations. Her weight is 253 lb (114.8 kg) today and has had a weight loss of 1 pound over a period of 2 to 3 weeks since her last visit. She has lost 7 lbs since starting treatment with [name].  Vitamin D deficiency [name] has a diagnosis of vitamin D deficiency. She is currently taking high dose vit D and denies nausea, vomiting or muscle weakness.  At risk for osteopenia and osteoporosis [name] is at higher risk of osteopenia and osteoporosis due to vitamin D deficiency.   Diabetes II [name] has a diagnosis of diabetes type II. She is currently taking metformin and trulicity. [name] states fasting BGs range between 100 and 120 and she denies any hypoglycemic episodes. Last A1c was at 6.2 and last insulin level was at 12.3 She has been working on intensive lifestyle modifications including diet, exercise, and weight loss to help control her blood glucose levels.  ASSESSMENT AND PLAN:  Vitamin D deficiency - Plan: Vitamin D, Ergocalciferol, [name]  Type 2 diabetes mellitus without complication, without long-term current use of insulin (HCC)  At risk for osteoporosis  Class 3 severe obesity with serious comorbidity and body mass index (BMI) of 40.0 to 44.9 in adult, unspecified obesity type ([place])  PLAN:  Vitamin D Deficiency [name] was informed that low vitamin D levels contributes to fatigue and are associated with obesity, breast, and colon cancer. She agrees to continue to take prescription Vit D @50 ,000 IU every week #4 with no refills  and will follow up for routine testing of vitamin D, at least 2-3 times per year. She was informed of the risk of over-replacement of vitamin D and agrees to not increase her dose unless she discusses this with [name] first. Alvine agrees to follow up as directed.  At risk for osteopenia and osteoporosis [name] was given extended  (15 minutes) osteoporosis prevention counseling today. [name] is at risk for osteopenia and osteoporosis due to her vitamin D deficiency. She was encouraged to take her vitamin D and follow her higher calcium diet and increase strengthening exercise to help strengthen her bones and decrease her risk of osteopenia and osteoporosis.  Diabetes II [name] has been given extensive diabetes education by myself today including ideal fasting and post-prandial blood glucose readings, individual ideal Hgb A1c goals and hypoglycemia prevention. We discussed the importance of good blood sugar control to decrease the likelihood of diabetic complications such as nephropathy, neuropathy, limb loss, blindness, coronary artery disease, and death. We discussed the importance of intensive lifestyle modification including diet, exercise and weight loss as the first line treatment for diabetes. [name] agrees to continue her diabetes medications and will follow up at the agreed upon time.  Obesity [name] is currently in the action stage of change. As such, her goal is to continue with weight loss efforts She has agreed to follow the Category 2 plan with Category 1 and Category 2 breakfast options [name] has been instructed to work up to a goal of 150 minutes of combined cardio and strengthening exercise  per week or she will go walking several times a day for weight loss and overall health benefits. We discussed the following Behavioral Modification Strategies today: increase H2O intake, no skipping meals, keeping healthy foods in the home, increasing lean protein intake, decreasing simple  carbohydrates, increasing vegetables, decrease eating out and work on meal planning and easy cooking plans Dinner options were provided to patient today.  [name] has agreed to follow up with our clinic in 2 weeks. She was informed of the importance of frequent follow up visits to maximize her success with intensive lifestyle modifications for her multiple health conditions.  ALLERGIES: No Known Allergies  MEDICATIONS: Current Outpatient Medications on File Prior to Visit  Medication Sig Dispense Refill  . acetaminophen (TYLENOL) 500 MG tablet Take 500 mg by mouth every 6 (six) hours as needed for mild pain or headache.    Marland Kitchen aspirin EC 81 MG EC tablet Take 1 tablet (81 mg total) daily by mouth.    Marland Kitchen atorvastatin (LIPITOR) 40 MG tablet Take 1 tablet (40 mg total) by mouth daily. 90 tablet 3  . clopidogrel (PLAVIX) 75 MG tablet Take 1 tablet (75 mg total) by mouth daily. 90 tablet 0  . Coenzyme Q10 (COQ10) 100 MG CAPS Take 100 mg by mouth daily.    . colchicine 0.6 MG tablet Take two (2) tablets by mouth at time of initial pain. Then take one (1) tablet by mouth twice daily as needed for gout.    . Dulaglutide (TRULICITY) 1.5 XK/4.8JE SOPN Inject 1 Dose into the skin once a week.    . metFORMIN (GLUCOPHAGE) 500 MG tablet Take 500 mg by mouth 2 (two) times daily with a meal.    . metoprolol tartrate (LOPRESSOR) 25 MG tablet Take 1 tablet (25 mg total) by mouth 2 (two) times daily. 180 tablet 2  . Misc Natural Products (DAILY HERBS BONE/JOINTS PO) Take 3-5 tablets daily by mouth. Insta flex    . olmesartan (BENICAR) 5 MG tablet Take 1 tablet (5 mg total) by mouth daily. 90 tablet 3   No current facility-administered medications on file prior to visit.     PAST MEDICAL HISTORY: Past Medical History:  Diagnosis Date  . Anxiety   . Back pain   . Chest pain   . Coronary artery disease   . Diabetes mellitus   . Difficulty swallowing pills   . Fatigue   . GERD (gastroesophageal reflux  disease)   . Hay fever   . Headache   . Heartburn   . History of heart attack   . HTN (hypertension)   . Hyperlipidemia   . Joint pain   . Knee pain   . Nervousness   . Palpitations   . Shortness of breath   . Shortness of breath on exertion   . Stress     PAST SURGICAL HISTORY: Past Surgical History:  Procedure Laterality Date  . CORONARY ARTERY BYPASS GRAFT N/A 08/02/2017   Procedure: CORONARY ARTERY BYPASS GRAFTING (CABG) X 5 (LIMA to LAD, LEFT RADIAL ARTERY to DIAGONAL, SVG to SEQUENTIALLY PDA and PLB) , USING LEFT INTERNAL MAMMARY ARTERY, LEFT RADIAL ARTERY, AND  SAPHENOUS VEIN to [place], RIGHT GREATER SAPHENOUS VEIN HARVESTED ENDOSCOPICALLY;  Surgeon: Grace Isaac, MD;  Location: [place];  Service: Open Heart Surgery;  Laterality: N/A;  . CORONARY/GRAFT ACUTE MI REVASCULARIZATION N/A 07/24/2017   Procedure: Coronary/Graft Acute MI Revascularization;  Surgeon: Belva Crome, MD;  Location: [place] [facility];  Service: Cardiovascular;  Laterality: N/A;  . LAPAROSCOPIC GASTRIC BANDING  09/02/2008  . LEFT HEART CATH AND CORONARY ANGIOGRAPHY N/A 07/24/2017   Procedure: LEFT HEART CATH AND CORONARY ANGIOGRAPHY;  Surgeon: Belva Crome, MD;  Location: [place] [facility];  Service: Cardiovascular;  Laterality: N/A;  . RADIAL ARTERY HARVEST Left 08/02/2017   Procedure: LEFT RADIAL ARTERY HARVEST;  Surgeon: Grace Isaac, MD;  Location: [place];  Service: Open Heart Surgery;  Laterality: Left;  . STERNAL CLOSURE N/A 08/02/2017   Procedure: STERNAL PLATING;  Surgeon: Grace Isaac, MD;  Location: [place];  Service: Open Heart Surgery;  Laterality: N/A;  . [place]   had metal removed from stomach as a child  . TEE WITHOUT CARDIOVERSION N/A 08/02/2017   Procedure: TRANSESOPHAGEAL ECHOCARDIOGRAM (TEE);  Surgeon: Grace Isaac, MD;  Location: [place];  Service: Open Heart Surgery;  Laterality: N/A;    SOCIAL HISTORY: Social History   Tobacco Use  .  Smoking status: Never Smoker  . Smokeless tobacco: Never Used  Substance Use Topics  . Alcohol use: Yes    Comment: occ  . Drug use: No    FAMILY HISTORY: Family History  Problem Relation Age of Onset  . Diabetes Father   . Hypertension Father   . Heart disease Father   . Sudden death Father   . Hyperlipidemia Mother   . Hypertension Mother   . Obesity Mother   . Cancer Maternal Grandmother        lung  . Cancer Maternal Grandfather        prostate    ROS: Review of Systems  Constitutional: Positive for weight loss.  Gastrointestinal: Negative for nausea and vomiting.  Musculoskeletal:       Negative for muscle weakness  Endo/Heme/Allergies:       Negative for hypoglycemia    PHYSICAL EXAM: Blood pressure 131/86, pulse 72, temperature 98.4 F (36.9 C), temperature source Oral, height 5\' 3"  (1.6 m), weight 253 lb (114.8 kg), SpO2 100 %. Body mass index is 44.82 kg/m. Physical Exam Vitals signs reviewed.  Constitutional:      Appearance: Normal appearance. She is well-developed. She is obese.  Cardiovascular:     Rate and Rhythm: Normal rate.  Pulmonary:     Effort: Pulmonary effort is normal.  Musculoskeletal: Normal range of motion.  Skin:    General: Skin is warm and dry.  Neurological:     Mental Status: She is alert and oriented to person, place, and time.  Psychiatric:        Mood and Affect: Mood normal.        Behavior: Behavior normal.     RECENT LABS AND TESTS: BMET    Component Value Date/Time   NA 142 10/19/2018 0839   K 4.8 10/19/2018 0839   CL 104 10/19/2018 0839   CO2 20 10/19/2018 0839   GLUCOSE 127 (H) 10/19/2018 0839   GLUCOSE 150 (H) 08/08/2017 0348   BUN 14 10/19/2018 0839   CREATININE 0.88 10/19/2018 0839   CALCIUM 9.1 10/19/2018 0839   GFRNONAA 84 10/19/2018 0839   GFRAA 96 10/19/2018 0839   Lab Results  Component Value Date   HGBA1C 6.2 (H) 10/19/2018   HGBA1C 6.2 (H) 06/12/2018   HGBA1C 7.1 (H) 08/01/2017   HGBA1C  7.4 (H) 07/25/2017   HGBA1C 7.4 (H) 07/24/2017   Lab Results  Component Value Date   INSULIN 12.3 10/19/2018   INSULIN 16.3 06/12/2018   CBC  Component Value Date/Time   WBC 6.8 09/14/2017 0949   WBC 9.6 08/08/2017 0348   RBC 5.08 09/14/2017 0949   RBC 2.89 (L) 08/08/2017 0348   HGB 13.3 09/14/2017 0949   HCT 39.9 09/14/2017 0949   PLT 320 09/14/2017 0949   MCV 79 09/14/2017 0949   MCH 26.2 (L) 09/14/2017 0949   MCH 27.3 08/08/2017 0348   MCHC 33.3 09/14/2017 0949   MCHC 33.3 08/08/2017 0348   RDW 13.8 09/14/2017 0949   LYMPHSABS 1.8 07/24/2017 0518   MONOABS 0.7 07/24/2017 0518   EOSABS 0.1 07/24/2017 0518   BASOSABS 0.0 07/24/2017 0518   Iron/TIBC/Ferritin/ %Sat No results found for: IRON, TIBC, FERRITIN, IRONPCTSAT Lipid Panel     Component Value Date/Time   CHOL 112 06/12/2018 0808   TRIG 66 06/12/2018 0808   HDL 37 (L) 06/12/2018 0808   CHOLHDL 2.7 03/16/2018 0936   CHOLHDL 3.6 07/26/2017 0250   VLDL 25 07/26/2017 0250   LDLCALC 62 06/12/2018 0808   Hepatic Function Panel     Component Value Date/Time   PROT 6.4 10/19/2018 0839   ALBUMIN 3.8 10/19/2018 0839   AST 16 10/19/2018 0839   ALT 13 10/19/2018 0839   ALKPHOS 77 10/19/2018 0839   BILITOT 0.7 10/19/2018 0839   BILIDIR 0.14 03/16/2018 0936      Component Value Date/Time   TSH 1.560 06/12/2018 0808     Ref. Range 10/19/2018 08:39  Vitamin D, 25-Hydroxy Latest Ref Range: 30.0 - 100.0 ng/mL 57.8     OBESITY BEHAVIORAL INTERVENTION VISIT  Today's visit was # 10   Starting weight: 260 lbs Starting date: 06/08/2018 Today's weight : 253 lbs Today's date: 12/05/2018 Total lbs lost to date: 7    12/05/2018  Height 5\' 3"  (1.6 m)  Weight 253 lb (114.8 kg)  BMI (Calculated) 44.83  BLOOD PRESSURE - SYSTOLIC 803  BLOOD PRESSURE - DIASTOLIC 86   Body Fat % 21.2 %  Total Body Water (lbs) 89.8 lbs    ASK: We discussed the diagnosis of obesity with Wiktoria D Nichols today and Zuleica agreed to  give [name] permission to discuss obesity behavioral modification therapy today.  ASSESS: Denzil has the diagnosis of obesity and her BMI today is 44.83 [place] is in the action stage of change   ADVISE: [name] was educated on the multiple health risks of obesity as well as the benefit of weight loss to improve her health. She was advised of the need for long term treatment and the importance of lifestyle modifications to improve her current health and to decrease her risk of future health problems.  AGREE: Multiple dietary modification options and treatment options were discussed and  Jerrine agreed to follow the recommendations documented in the above note.  ARRANGE: Riko was educated on the importance of frequent visits to treat obesity as outlined per CMS and USPSTF guidelines and agreed to schedule her next follow up appointment today.  Corey Skains, am acting as Location manager for [employer]. Owens Shark, DO  I have reviewed the above documentation for accuracy and completeness, and I agree with the above. -Jearld Lesch, DO

## 2018-12-06 ENCOUNTER — Ambulatory Visit (INDEPENDENT_AMBULATORY_CARE_PROVIDER_SITE_OTHER): Payer: Self-pay | Admitting: Bariatrics

## 2018-12-07 ENCOUNTER — Encounter (INDEPENDENT_AMBULATORY_CARE_PROVIDER_SITE_OTHER): Payer: Self-pay

## 2018-12-12 ENCOUNTER — Encounter (INDEPENDENT_AMBULATORY_CARE_PROVIDER_SITE_OTHER): Payer: Self-pay

## 2018-12-12 NOTE — Progress Notes (Signed)
Office: [phone]  /  Fax: [phone]    Date: December 13, 2018   Appointment Start Time: 4:22pm Duration: 31 minutes Provider: Glennie Isle, Psy.D. Type of Session: Individual Therapy  Location of Patient: Home Location of Provider: Office  Type of Contact: Telepsychological Visit via [employer]   Session Content: Notably, this provider waited for [name] to join the WebEx meeting until 4:05 PM, and then called her via telephone. She explained she was on the website and was also "waiting." This provider assisted [name] with troubleshooting to ensure both audio and video were working prior to proceeding with today's appointment. As such, the appointment was initiated at 4:22 PM.  [name] was agreeable to meeting for the full duration of the scheduled appointment despite the late start. Once the Webex meeting was successfully initiated, there were a few instances throughout the appointment where [name]'s video appeared to freeze for very brief durations.  Prior to initiating teletherapy services, [name] was provided with an informed consent document for telepsychological services, which included the development of a safety plan in the event of an emergency/crisis. [name] provided written informed consent prior to proceeding. Notably, it also addressed that [name] is ultimately responsible for understanding her insurance benefits as it relates to reimbursement of telepsychological services. Due to COVID-19, the signed consent was returned to this provider via a MyChart message as the clinic is closed. Notably, [name] acknowledged understanding that all MyChart messages are visible to all providers as they are part of the electronic medical record. In addition, this provider explained the telepsychological services informed consent document would be considered an addendum to that initial consent document.   Today's appointment was a telepsychological visit, as this provider's clinic is  closed for in-person visits due to COVID-19. Therapeutic services will resume to in-person appointments once the clinic re-opens. [name] expressed understanding regarding the rationale for telepsychological services, and consented to proceed. This provider also reviewed that she is ultimately responsible for understanding her insurance benefits and coverage of telepsychological services. She verbally acknowledged understanding, and prior to proceeding with today's appointment, [name]'s physical location at the time of this appointment was obtained. [name] reported she was at [address] [place], [place], which is [name]'s home. In the event of technical difficulties, [name] stated she can be reached at the following phone number: [phone]. [name] and this provider participated in today's telepsychological service. Also, [name] anyone else being present in her bedroom during the telepsychological visit.   [name] is a 38 y.o. female presenting via [place] Webex for a follow-up appointment to address the previously established treatment goal of decreasing emotional eating. The session was initiated with the verbal administration of the PHQ-9 and GAD-7, as well as a brief check-in. [name] reported experiencing worry related to the coronavirus and "figuring out how to work from home." She added she is "anxious" regarding the possibility of her having to provide telehealth services for speech therapy. The aforementioned was normalized. Regarding eating, she discussed deviations from the structured meal plan. However she noted "it is not too awful." She discussed eating a bag of chips occasionally and acknowledged it was secondary to physical hunger; however, she was also experiencing stress. When experiencing cravings, she discussed engaging in the previously discussed mindfulness exercises and described them as helpful. Overall, she noted a decrease in emotional eating despite ongoing stressors  and worry. To further cultivate her mindfulness practice, psychoeducation regarding the hunger and satisfaction scale was provided. This provider also discussed an urge surfing mindfulness exercise  to help with cravings.This provider sent [name] messages with handouts for the aforementioned. Prior to sending the messages, this provider explained the messages would be visible to all providers, as it would be part of the electronic medical record. [name] verbally acknowledged understanding, and verbally consented to this provider sending MyChart messages. While today was scheduled to be the last appointment with this provider, due to recent stressors, this provider recommended a follow-up appointment. [name] was receptive. [name] was receptive to today's session as evidenced by openness to sharing, responsiveness to feedback, and willingness to continue engaging in learned skills.  Mental Status Examination:  Appearance: neat Behavior: cooperative Mood: euthymic Affect: mood congruent Speech: normal in rate, volume, and tone Eye Contact: appropriate Psychomotor Activity: appropriate Thought Process: linear, logical, and goal directed  Content/Perceptual Disturbances: denies suicidal and homicidal ideation, plan, and intent and no hallucinations, delusions, bizarre thinking or behavior reported or observed Orientation: time, person, place and purpose of appointment Cognition/Sensorium: memory, attention, language, and fund of knowledge intact  Insight: good Judgment: good  Structured Assessment Results: The Patient Health Questionnaire-9 (PHQ-9) is a self-report measure that assesses symptoms and severity of depression over the course of the last two weeks. [name] obtained a score of zero. Depression screen [name] 2/9 12/13/2018  Decreased Interest 0  Down, Depressed, Hopeless 0  PHQ - 2 Score 0  Altered sleeping 0  Tired, decreased energy 0  Change in appetite 0  Feeling bad or  failure about yourself  0  Trouble concentrating 0  Moving slowly or fidgety/restless 0  Suicidal thoughts 0  PHQ-9 Score 0  Difficult doing work/chores -   The Generalized Anxiety Disorder-7 (GAD-7) is a brief self-report measure that assesses symptoms of anxiety over the course of the last two weeks. [name] obtained a score of 1 suggesting minimal anxiety. GAD 7 : Generalized Anxiety Score 12/13/2018  Nervous, Anxious, on Edge 1  Control/stop worrying 0  Worry too much - different things 0  Trouble relaxing 0  Restless 0  Easily annoyed or irritable 0  Afraid - awful might happen 0  Total GAD 7 Score 1  Anxiety Difficulty Not difficult at all   Interventions:  Administration of PHQ-9 and GAD-7 for symptom monitoring Review of content from the previous session Empathic reflections and validation Termination planning Positive reinforcement Psychoeducation regarding the hunger and satisfaction scale Brief chart review  DSM-5 Diagnosis: 311 (F32.8) Other Specified Depressive Disorder, Emotional Eating  Treatment Goal & Progress: During the initial appointment with this provider, the following treatment goal was established: decrease emotional eating. [name] has demonstrated progress in her goal as evidenced by increased awareness of hunger patterns and triggers for emotional eating. Since the onset of treatment with this provider, there has been an overall reduction in emotional eating.  [name] also continues to demonstrate willingness to engage in learned skills.  Plan: [name] continues to appear able and willing to participate as evidenced by engagement in reciprocal conversation, and asking questions for clarification as appropriate. The next appointment will be scheduled in three weeks, which will be via [employer]. Once this provider's office resumes in-person appointments, [name] will be notified. The next session will focus on reviewing learned skills.

## 2018-12-13 ENCOUNTER — Encounter (INDEPENDENT_AMBULATORY_CARE_PROVIDER_SITE_OTHER): Payer: Self-pay

## 2018-12-13 ENCOUNTER — Ambulatory Visit (INDEPENDENT_AMBULATORY_CARE_PROVIDER_SITE_OTHER): Payer: BC Managed Care – PPO | Admitting: Psychology

## 2018-12-13 ENCOUNTER — Other Ambulatory Visit: Payer: Self-pay

## 2018-12-13 DIAGNOSIS — F3289 Other specified depressive episodes: Secondary | ICD-10-CM

## 2018-12-18 ENCOUNTER — Encounter (INDEPENDENT_AMBULATORY_CARE_PROVIDER_SITE_OTHER): Payer: Self-pay | Admitting: Bariatrics

## 2018-12-18 NOTE — Telephone Encounter (Signed)
Just a FYI

## 2018-12-20 ENCOUNTER — Other Ambulatory Visit: Payer: Self-pay

## 2018-12-20 ENCOUNTER — Encounter (INDEPENDENT_AMBULATORY_CARE_PROVIDER_SITE_OTHER): Payer: Self-pay | Admitting: Bariatrics

## 2018-12-20 ENCOUNTER — Ambulatory Visit (INDEPENDENT_AMBULATORY_CARE_PROVIDER_SITE_OTHER): Payer: BC Managed Care – PPO | Admitting: Bariatrics

## 2018-12-20 DIAGNOSIS — R7303 Prediabetes: Secondary | ICD-10-CM

## 2018-12-20 DIAGNOSIS — E559 Vitamin D deficiency, unspecified: Secondary | ICD-10-CM

## 2018-12-20 DIAGNOSIS — E7800 Pure hypercholesterolemia, unspecified: Secondary | ICD-10-CM

## 2018-12-20 DIAGNOSIS — Z6841 Body Mass Index (BMI) 40.0 and over, adult: Secondary | ICD-10-CM

## 2018-12-20 DIAGNOSIS — E66813 Obesity, class 3: Secondary | ICD-10-CM

## 2018-12-20 MED ORDER — VITAMIN D (ERGOCALCIFEROL) 1.25 MG (50000 UNIT) PO CAPS: 50000.0000 [IU] | ORAL_CAPSULE | ORAL | 0 refills | Status: DC

## 2018-12-21 DIAGNOSIS — R7303 Prediabetes: Secondary | ICD-10-CM | POA: Insufficient documentation

## 2018-12-21 NOTE — Progress Notes (Signed)
Office: [phone]  /  Fax: [phone] TeleHealth Visit:  [name] has verbally consented to this TeleHealth visit today. The patient is located at home, the provider is located at the [employer] [facility] office. The participants in this visit include the listed provider and patient and any and all parties involved. The visit was conducted today via WebEx.  HPI:   Chief Complaint: OBESITY [name] is here to discuss her progress with her obesity treatment plan. She is on the Category 2 plan and is following her eating plan approximately 50 to 60 % of the time. She states she is exercising 0 minutes 0 times per week. [name] has gained 2 to 3 pounds, per patient. It has been stressful at this time. She has had some stress eating. She is working at home. Her appetite has varied. We were unable to weigh the patient today for this TeleHealth visit. She feels as if she has gained 2 to 3 pounds  since her last visit. She has lost 7 lbs since starting treatment with [name].  Pre-Diabetes [name] has a diagnosis of prediabetes based on her elevated Hgb A1c and was informed this puts her at greater risk of developing diabetes. She is taking metformin and trulicity. Her fasting BGs range between 80 and 100. Her last A1c was at 6.2 and last insulin level was at 12.3 [name] continues to work on diet and exercise to decrease risk of diabetes. She denies hypoglycemia.  Pure Hypercholesterolemia (target LDL < 70) [name] has pure hypercholesterolemia and she is currently taking Atorvastatin. She has been trying to improve her cholesterol levels with intensive lifestyle modification including a low saturated fat diet, exercise and weight loss. She denies myalgias.  Vitamin D deficiency [name] has a diagnosis of vitamin D deficiency. She is currently taking vit D and denies nausea, vomiting or muscle weakness.  ASSESSMENT AND PLAN:  Vitamin D deficiency - Plan: Vitamin D, Ergocalciferol,  [name]  Prediabetes  Hypercholesteremia  Class 3 severe obesity with serious comorbidity and body mass index (BMI) of 40.0 to 44.9 in adult, unspecified obesity type ([place])  PLAN:  Pre-Diabetes [name] will continue to work on weight loss, exercise, increasing lean protein and decreasing simple carbohydrates in her diet to help decrease the risk of diabetes. We dicussed metformin including benefits and risks. She was informed that eating too many simple carbohydrates or too many calories at one sitting increases the likelihood of GI side effects. [name] will continue her diabetes medications and follow up with [name] as directed to monitor her progress.  Pure Hypercholesterolemia (target LDL < 70) [name] was informed of the American Heart Association Guidelines emphasizing intensive lifestyle modifications as the first line treatment for hypercholesterolemia. We discussed many lifestyle modifications today in depth, and [name] will continue to work on decreasing saturated fats such as fatty red meat, butter and many fried foods. She will also increase vegetables and lean protein in her diet and continue to work on exercise and weight loss efforts. [name] will continue Atorvastatin and follow up as directed.  Vitamin D Deficiency [name] was informed that low vitamin D levels contributes to fatigue and are associated with obesity, breast, and colon cancer. She agrees to decrease prescription Vit D @50 ,000 IU to every 14 days and will follow up for routine testing of vitamin D, at least 2-3 times per year. She was informed of the risk of over-replacement of vitamin D and agrees to not increase her  dose unless she discusses this with [name] first. We will recheck labs in the near future.  Obesity [name] is currently in the action stage of change. As such, her goal is to continue with weight loss efforts She has agreed to follow the Category 2 plan with Category 1 and  2 breakfast options [name] will start exercising to YouTube exercise videos for weight loss and overall health benefits. We discussed the following Behavioral Modification Strategies today: increase H2O intake, no skipping meals, keeping healthy foods in the home, increasing lean protein intake, decreasing simple carbohydrates, increasing vegetables, decrease eating out and work on meal planning and easy cooking plans If she snacks, she will watch the Category 2 snacks (try raw vegetables and fruit). Alternatives for lunch were given to patient today.  [name] has agreed to follow up with our clinic in 2 weeks. She was informed of the importance of frequent follow up visits to maximize her success with intensive lifestyle modifications for her multiple health conditions.  ALLERGIES: No Known Allergies  MEDICATIONS: Current Outpatient Medications on File Prior to Visit  Medication Sig Dispense Refill  . acetaminophen (TYLENOL) 500 MG tablet Take 500 mg by mouth every 6 (six) hours as needed for mild pain or headache.    Marland Kitchen aspirin EC 81 MG EC tablet Take 1 tablet (81 mg total) daily by mouth.    Marland Kitchen atorvastatin (LIPITOR) 40 MG tablet Take 1 tablet (40 mg total) by mouth daily. 90 tablet 3  . clopidogrel (PLAVIX) 75 MG tablet Take 1 tablet (75 mg total) by mouth daily. 90 tablet 0  . Coenzyme Q10 (COQ10) 100 MG CAPS Take 100 mg by mouth daily.    . colchicine 0.6 MG tablet Take two (2) tablets by mouth at time of initial pain. Then take one (1) tablet by mouth twice daily as needed for gout.    . Dulaglutide (TRULICITY) 1.5 NO/6.7EH SOPN Inject 1 Dose into the skin once a week.    . metFORMIN (GLUCOPHAGE) 500 MG tablet Take 500 mg by mouth 2 (two) times daily with a meal.    . metoprolol tartrate (LOPRESSOR) 25 MG tablet Take 1 tablet (25 mg total) by mouth 2 (two) times daily. 180 tablet 2  . Misc Natural Products (DAILY HERBS BONE/JOINTS PO) Take 3-5 tablets daily by mouth. Insta flex    .  olmesartan (BENICAR) 5 MG tablet Take 1 tablet (5 mg total) by mouth daily. 90 tablet 3   No current facility-administered medications on file prior to visit.     PAST MEDICAL HISTORY: Past Medical History:  Diagnosis Date  . Anxiety   . Back pain   . Chest pain   . Coronary artery disease   . Diabetes mellitus   . Difficulty swallowing pills   . Fatigue   . GERD (gastroesophageal reflux disease)   . Hay fever   . Headache   . Heartburn   . History of heart attack   . HTN (hypertension)   . Hyperlipidemia   . Joint pain   . Knee pain   . Nervousness   . Palpitations   . Shortness of breath   . Shortness of breath on exertion   . Stress     PAST SURGICAL HISTORY: Past Surgical History:  Procedure Laterality Date  . CORONARY ARTERY BYPASS GRAFT N/A 08/02/2017   Procedure: CORONARY ARTERY BYPASS GRAFTING (CABG) X 5 (LIMA to LAD, LEFT RADIAL ARTERY to DIAGONAL, SVG to SEQUENTIALLY PDA and PLB) , USING  LEFT INTERNAL MAMMARY ARTERY, LEFT RADIAL ARTERY, AND  SAPHENOUS VEIN to [place], RIGHT GREATER SAPHENOUS VEIN HARVESTED ENDOSCOPICALLY;  Surgeon: Grace Isaac, MD;  Location: [facility];  Service: Open Heart Surgery;  Laterality: N/A;  . CORONARY/GRAFT ACUTE MI REVASCULARIZATION N/A 07/24/2017   Procedure: Coronary/Graft Acute MI Revascularization;  Surgeon: Belva Crome, MD;  Location: [facility];  Service: Cardiovascular;  Laterality: N/A;  . LAPAROSCOPIC GASTRIC BANDING  09/02/2008  . LEFT HEART CATH AND CORONARY ANGIOGRAPHY N/A 07/24/2017   Procedure: LEFT HEART CATH AND CORONARY ANGIOGRAPHY;  Surgeon: Belva Crome, MD;  Location: [facility];  Service: Cardiovascular;  Laterality: N/A;  . RADIAL ARTERY HARVEST Left 08/02/2017   Procedure: LEFT RADIAL ARTERY HARVEST;  Surgeon: Grace Isaac, MD;  Location: [facility];  Service: Open Heart Surgery;  Laterality: Left;  . STERNAL CLOSURE N/A 08/02/2017   Procedure: STERNAL PLATING;  Surgeon: Grace Isaac, MD;  Location: [facility];  Service: Open Heart Surgery;  Laterality: N/A;  . [place]   had metal removed from stomach as a child  . TEE WITHOUT CARDIOVERSION N/A 08/02/2017   Procedure: TRANSESOPHAGEAL ECHOCARDIOGRAM (TEE);  Surgeon: Grace Isaac, MD;  Location: [facility];  Service: Open Heart Surgery;  Laterality: N/A;    SOCIAL HISTORY: Social History   Tobacco Use  . Smoking status: Never Smoker  . Smokeless tobacco: Never Used  Substance Use Topics  . Alcohol use: Yes    Comment: occ  . Drug use: No    FAMILY HISTORY: Family History  Problem Relation Age of Onset  . Diabetes Father   . Hypertension Father   . Heart disease Father   . Sudden death Father   . Hyperlipidemia Mother   . Hypertension Mother   . Obesity Mother   . Cancer Maternal Grandmother        lung  . Cancer Maternal Grandfather        prostate    ROS: Review of Systems  Constitutional: Positive for weight loss.  Gastrointestinal: Negative for nausea and vomiting.  Musculoskeletal: Negative for myalgias.       Negative for muscle weakness  Endo/Heme/Allergies:       Negative for hypoglycemia    PHYSICAL EXAM: Pt in no acute distress  RECENT LABS AND TESTS: BMET    Component Value Date/Time   NA 142 10/19/2018 0839   K 4.8 10/19/2018 0839   CL 104 10/19/2018 0839   CO2 20 10/19/2018 0839   GLUCOSE 127 (H) 10/19/2018 0839   GLUCOSE 150 (H) 08/08/2017 0348   BUN 14 10/19/2018 0839   CREATININE 0.88 10/19/2018 0839   CALCIUM 9.1 10/19/2018 0839   GFRNONAA 84 10/19/2018 0839   GFRAA 96 10/19/2018 0839   Lab Results  Component Value Date   HGBA1C 6.2 (H) 10/19/2018   HGBA1C 6.2 (H) 06/12/2018   HGBA1C 7.1 (H) 08/01/2017   HGBA1C 7.4 (H) 07/25/2017   HGBA1C 7.4 (H) 07/24/2017   Lab Results  Component Value Date   INSULIN 12.3 10/19/2018   INSULIN 16.3 06/12/2018   CBC    Component Value Date/Time   WBC 6.8 09/14/2017 0949   WBC 9.6 08/08/2017 0348    RBC 5.08 09/14/2017 0949   RBC 2.89 (L) 08/08/2017 0348   HGB 13.3 09/14/2017 0949   HCT 39.9 09/14/2017 0949   PLT 320 09/14/2017 0949   MCV 79 09/14/2017 0949   MCH 26.2 (L) 09/14/2017 6283  MCH 27.3 08/08/2017 0348   MCHC 33.3 09/14/2017 0949   MCHC 33.3 08/08/2017 0348   RDW 13.8 09/14/2017 0949   LYMPHSABS 1.8 07/24/2017 0518   MONOABS 0.7 07/24/2017 0518   EOSABS 0.1 07/24/2017 0518   BASOSABS 0.0 07/24/2017 0518   Iron/TIBC/Ferritin/ %Sat No results found for: IRON, TIBC, FERRITIN, IRONPCTSAT Lipid Panel     Component Value Date/Time   CHOL 112 06/12/2018 0808   TRIG 66 06/12/2018 0808   HDL 37 (L) 06/12/2018 0808   CHOLHDL 2.7 03/16/2018 0936   CHOLHDL 3.6 07/26/2017 0250   VLDL 25 07/26/2017 0250   LDLCALC 62 06/12/2018 0808   Hepatic Function Panel     Component Value Date/Time   PROT 6.4 10/19/2018 0839   ALBUMIN 3.8 10/19/2018 0839   AST 16 10/19/2018 0839   ALT 13 10/19/2018 0839   ALKPHOS 77 10/19/2018 0839   BILITOT 0.7 10/19/2018 0839   BILIDIR 0.14 03/16/2018 0936      Component Value Date/Time   TSH 1.560 06/12/2018 0808     Ref. Range 10/19/2018 08:39  Vitamin D, 25-Hydroxy Latest Ref Range: 30.0 - 100.0 ng/mL 57.8     I, Doreene Nest, am acting as Location manager for [employer]. Owens Shark, DO  I have reviewed the above documentation for accuracy and completeness, and I agree with the above. -Jearld Lesch, DO

## 2019-01-02 ENCOUNTER — Encounter (INDEPENDENT_AMBULATORY_CARE_PROVIDER_SITE_OTHER): Payer: Self-pay

## 2019-01-02 NOTE — Progress Notes (Signed)
Office: [phone]  /  Fax: [phone]    Date: January 03, 2019   Appointment Start Time: 12:36pm Duration: 20 minutes Provider: Glennie Isle, Psy.D. Type of Session: Individual Therapy  Location of Patient: Home Location of Provider: Home  Type of Contact: Telepsychological Visit via [employer]   Session Content: [name] is a 38 y.o. female presenting via [place] Webex for a follow-up appointment to address the previously established treatment goal of decreasing emotional eating. Today's appointment was a telepsychological visit, as this provider's clinic is closed for in-person visits due to COVID-19. Therapeutic services will resume to in-person appointments once the clinic re-opens. [name] expressed understanding regarding the rationale for telepsychological services. Prior to proceeding with today's appointment, [name]'s physical location at the time of this appointment was obtained. [name] reported she was at home and provided the address. In the event of technical difficulties, [name] shared a phone number she could be reached at. [name] and this provider participated in today's telepsychological service. Also, [name] denied anyone else being present in the room or on the [employer]. Of note, this provider called [name] at 12:34pm as she had not presented for the WebEx appointment. She indicated she could not find the e-mail with the secure link; therefore, the e-mail was re-sent.   This provider conducted a brief check-in. [name] stated, "Still adjusting, but everything has been good." Regarding eating, [name] shared, "I dropped down a few pounds, but I ate a little bit more than extra yesterday with my period coming on." Aside from when menstruating, [name] noted occasional episodes of emotional eating, and noted, "It's not out of control." Overall, [name] described a decrease in emotional eating since the onset of treatment with this provider. Notably, [name] requested  assistance with coping with emotional eating when it extends from one snack/meal to additional meals and potentially subsequent days. Thus, learned skills were reviewed, specifically mindfulness. Regarding mindfulness, [name] shared engaging in the five senses exercises. This provider also discussed engaging in the following steps to assist with her concern: check-in regarding hydration; be prepared with snacks; engage in discussed strategies (e.g., 5 senses exercise); check-in with self (e.g, assess physical hunger, explore if something is bothersome, and explore if food will help with feeling better); and engage in a pleasurable activity. [name] noted, "That actually puts things into perspective a whole lot. I jotted down what you were saying." While today's appointment was scheduled to be the last appointment with this provider, an additional follow-up appointment was scheduled due to the current pandemic. [name] was receptive to today's session as evidenced by openness to sharing, responsiveness to feedback, and willingness to continue engaging in learned skills.  Mental Status Examination:  Appearance: neat Behavior: cooperative Mood: euthymic Affect: mood congruent Speech: normal in rate, volume, and tone Eye Contact: appropriate Psychomotor Activity: appropriate Thought Process: linear, logical, and goal directed  Content/Perceptual Disturbances: denies suicidal and homicidal ideation, plan, and intent and no hallucinations, delusions, bizarre thinking or behavior reported or observed Orientation: time, person, place and purpose of appointment Cognition/Sensorium: memory, attention, language, and fund of knowledge intact  Insight: good Judgment: good  Interventions:  Empathic reflections and validation Reviewed learned skills Positive reinforcement Brief chart review Employed supportive psychotherapy interventions today to facilitate reduced distress, and to improve coping skills with  identified stressors  DSM-5 Diagnosis: 311 (F32.8) Other Specified Depressive Disorder, Emotional Eating  Treatment Goal & Progress: During the initial appointment with this provider, the following treatment goal was established: decrease emotional eating. [name] has  demonstrated progress in her goal as evidenced by increased awareness of hunger patterns and triggers for emotional eating. She continues to demonstrate willingness to engage in learned skill, and noted an overall reduction in emotional eating since the onset of treatment with this provider.   Plan: [name] continues to appear able and willing to participate as evidenced by engagement in reciprocal conversation, and asking questions for clarification as appropriate. The next appointment will be scheduled in three weeks, which will be via [employer]. Once this provider's office resumes in-person appointments, [name] will be notified. The next session will focus on reviewing learned skills.

## 2019-01-03 ENCOUNTER — Other Ambulatory Visit: Payer: Self-pay

## 2019-01-03 ENCOUNTER — Encounter (INDEPENDENT_AMBULATORY_CARE_PROVIDER_SITE_OTHER): Payer: Self-pay | Admitting: Bariatrics

## 2019-01-03 ENCOUNTER — Ambulatory Visit (INDEPENDENT_AMBULATORY_CARE_PROVIDER_SITE_OTHER): Payer: BC Managed Care – PPO | Admitting: Bariatrics

## 2019-01-03 ENCOUNTER — Ambulatory Visit (INDEPENDENT_AMBULATORY_CARE_PROVIDER_SITE_OTHER): Payer: BC Managed Care – PPO | Admitting: Psychology

## 2019-01-03 DIAGNOSIS — Z6841 Body Mass Index (BMI) 40.0 and over, adult: Secondary | ICD-10-CM

## 2019-01-03 DIAGNOSIS — E559 Vitamin D deficiency, unspecified: Secondary | ICD-10-CM | POA: Diagnosis not present

## 2019-01-03 DIAGNOSIS — F3289 Other specified depressive episodes: Secondary | ICD-10-CM | POA: Diagnosis not present

## 2019-01-03 DIAGNOSIS — E66813 Obesity, class 3: Secondary | ICD-10-CM

## 2019-01-03 DIAGNOSIS — R7303 Prediabetes: Secondary | ICD-10-CM | POA: Diagnosis not present

## 2019-01-03 NOTE — Progress Notes (Signed)
Office: [phone]  /  Fax: [phone] TeleHealth Visit:  [name] has verbally consented to this TeleHealth visit today. The patient is located at home, the provider is located at the [employer] [facility] office. The participants in this visit include the listed provider and patient and any and all parties involved. The visit was conducted today via WebEx.  HPI:   Chief Complaint: OBESITY [name] is here to discuss her progress with her obesity treatment plan. She is on the Category 2 plan with Category 1 and Category 2 breakfast options and is following her eating plan approximately 70 % of the time. She states she walked for 30 minutes 1 or 2 times. [name] has lost 2 to 3 pounds. She is working from home. [name] has had some stress and she has done minimal stress eating. She is cooking more at home. Her appetite is up slightly. We were unable to weigh the patient today for this TeleHealth visit. She feels as if she has lost weight since her last visit. She has lost 9 or 10 lbs since starting treatment with [name].  Vitamin D deficiency [name] has a diagnosis of vitamin D deficiency. She is taking high dose vit D and denies nausea, vomiting or muscle weakness.  Pre-Diabetes [name] has a diagnosis of prediabetes based on her elevated Hgb A1c and was informed this puts her at greater risk of developing diabetes. Her last A1c was at 6.2 and last insulin level was at 12.3 She is taking metformin twice daily and she continues to work on diet and exercise to decrease risk of diabetes. She denies polyphagia.  Depression with emotional eating behaviors [name] is struggling with emotional eating and using food for comfort to the extent that it is negatively impacting her health. She often snacks when she is not hungry. [name] sometimes feels she is out of control and then feels guilty that she made poor food choices. [name] is meeting with Dr. Mallie Mussel. She has been working on  behavior modification techniques to help reduce her emotional eating and has been somewhat successful. She shows no sign of suicidal or homicidal ideations.  Depression screen [facility] 2/9 12/13/2018 11/23/2018 11/08/2018 10/23/2018 10/09/2018  Decreased Interest 0 0 0 0 0  Down, Depressed, Hopeless 0 0 0 0 0  PHQ - 2 Score 0 0 0 0 0  Altered sleeping 0 0 0 0 0  Tired, decreased energy 0 1 1 0 0  Change in appetite 0 0 0 0 0  Feeling bad or failure about yourself  0 0 0 0 0  Trouble concentrating 0 0 0 0 0  Moving slowly or fidgety/restless 0 0 0 0 0  Suicidal thoughts 0 0 0 0 0  PHQ-9 Score 0 1 1 0 0  Difficult doing work/chores - - - - -     ASSESSMENT AND PLAN:  Vitamin D deficiency  Prediabetes  Other depression - with emotional eating  Class 3 severe obesity with serious comorbidity and body mass index (BMI) of 40.0 to 44.9 in adult, unspecified obesity type (HCC)  PLAN:  Vitamin D Deficiency [name] was informed that low vitamin D levels contributes to fatigue and are associated with obesity, breast, and colon cancer. She will continue prescription Vit D @50 ,000 IU every 14 days and will follow up for routine testing of vitamin D, at least 2-3 times per year. She was informed of the risk of over-replacement of vitamin D and agrees to not increase her dose  unless she discusses this with [name] first.  Pre-Diabetes [name] will continue to work on weight loss, increasing activity, increasing lean protein and decreasing simple carbohydrates in her diet to help decrease the risk of diabetes. We dicussed metformin including benefits and risks. She was informed that eating too many simple carbohydrates or too many calories at one sitting increases the likelihood of GI side effects. [name] will continue metformin for now and a prescription was not written today. [name] agreed to follow up with [name] as directed to monitor her progress.  Depression with Emotional Eating Behaviors We discussed  behavior modification techniques today to help [name] deal with her emotional eating and depression. She will continue to meet with Dr. Mallie Mussel and follow up as directed.  Obesity [name] is currently in the action stage of change. As such, her goal is to continue with weight loss efforts She has agreed to follow the Category 2 plan with Category 1 and Category 2 breakfast options [name] will continue exercise for weight loss and overall health benefits. We discussed the following Behavioral Modification Strategies today: planning for success, increase H2O intake, no skipping meals, keeping healthy foods in the home, increasing lean protein intake, decreasing simple carbohydrates, increasing vegetables, decrease eating out, work on meal planning and easy cooking plans, emotional eating strategies, ways to avoid boredom eating and ways to avoid night time snacking [name] will weigh herself at home prior to her next visit.  [name] has agreed to follow up with our clinic in 2 weeks. She was informed of the importance of frequent follow up visits to maximize her success with intensive lifestyle modifications for her multiple health conditions.  ALLERGIES: No Known Allergies  MEDICATIONS: Current Outpatient Medications on File Prior to Visit  Medication Sig Dispense Refill  . acetaminophen (TYLENOL) 500 MG tablet Take 500 mg by mouth every 6 (six) hours as needed for mild pain or headache.    Marland Kitchen aspirin EC 81 MG EC tablet Take 1 tablet (81 mg total) daily by mouth.    Marland Kitchen atorvastatin (LIPITOR) 40 MG tablet Take 1 tablet (40 mg total) by mouth daily. 90 tablet 3  . clopidogrel (PLAVIX) 75 MG tablet Take 1 tablet (75 mg total) by mouth daily. 90 tablet 0  . Coenzyme Q10 (COQ10) 100 MG CAPS Take 100 mg by mouth daily.    . colchicine 0.6 MG tablet Take two (2) tablets by mouth at time of initial pain. Then take one (1) tablet by mouth twice daily as needed for gout.    . Dulaglutide (TRULICITY) 1.5  XB/3.5HG SOPN Inject 1 Dose into the skin once a week.    . metFORMIN (GLUCOPHAGE) 500 MG tablet Take 500 mg by mouth 2 (two) times daily with a meal.    . metoprolol tartrate (LOPRESSOR) 25 MG tablet Take 1 tablet (25 mg total) by mouth 2 (two) times daily. 180 tablet 2  . Misc Natural Products (DAILY HERBS BONE/JOINTS PO) Take 3-5 tablets daily by mouth. Insta flex    . olmesartan (BENICAR) 5 MG tablet Take 1 tablet (5 mg total) by mouth daily. 90 tablet 3  . Vitamin D, Ergocalciferol, [name] Take 1 capsule (50,000 Units total) by mouth every 14 (fourteen) days. 2 capsule 0   No current facility-administered medications on file prior to visit.     PAST MEDICAL HISTORY: Past Medical History:  Diagnosis Date  . Anxiety   . Back pain   . Chest pain   .  Coronary artery disease   . Diabetes mellitus   . Difficulty swallowing pills   . Fatigue   . GERD (gastroesophageal reflux disease)   . Hay fever   . Headache   . Heartburn   . History of heart attack   . HTN (hypertension)   . Hyperlipidemia   . Joint pain   . Knee pain   . Nervousness   . Palpitations   . Shortness of breath   . Shortness of breath on exertion   . Stress     PAST SURGICAL HISTORY: Past Surgical History:  Procedure Laterality Date  . CORONARY ARTERY BYPASS GRAFT N/A 08/02/2017   Procedure: CORONARY ARTERY BYPASS GRAFTING (CABG) X 5 (LIMA to LAD, LEFT RADIAL ARTERY to DIAGONAL, SVG to SEQUENTIALLY PDA and PLB) , USING LEFT INTERNAL MAMMARY ARTERY, LEFT RADIAL ARTERY, AND  SAPHENOUS VEIN to [place], RIGHT GREATER SAPHENOUS VEIN HARVESTED ENDOSCOPICALLY;  Surgeon: Grace Isaac, MD;  Location: [place];  Service: Open Heart Surgery;  Laterality: N/A;  . CORONARY/GRAFT ACUTE MI REVASCULARIZATION N/A 07/24/2017   Procedure: Coronary/Graft Acute MI Revascularization;  Surgeon: Belva Crome, MD;  Location: [place] [facility];  Service: Cardiovascular;  Laterality: N/A;  .  LAPAROSCOPIC GASTRIC BANDING  09/02/2008  . LEFT HEART CATH AND CORONARY ANGIOGRAPHY N/A 07/24/2017   Procedure: LEFT HEART CATH AND CORONARY ANGIOGRAPHY;  Surgeon: Belva Crome, MD;  Location: [place] [facility];  Service: Cardiovascular;  Laterality: N/A;  . RADIAL ARTERY HARVEST Left 08/02/2017   Procedure: LEFT RADIAL ARTERY HARVEST;  Surgeon: Grace Isaac, MD;  Location: [place];  Service: Open Heart Surgery;  Laterality: Left;  . STERNAL CLOSURE N/A 08/02/2017   Procedure: STERNAL PLATING;  Surgeon: Grace Isaac, MD;  Location: [place];  Service: Open Heart Surgery;  Laterality: N/A;  . [place]   had metal removed from stomach as a child  . TEE WITHOUT CARDIOVERSION N/A 08/02/2017   Procedure: TRANSESOPHAGEAL ECHOCARDIOGRAM (TEE);  Surgeon: Grace Isaac, MD;  Location: [place];  Service: Open Heart Surgery;  Laterality: N/A;    SOCIAL HISTORY: Social History   Tobacco Use  . Smoking status: Never Smoker  . Smokeless tobacco: Never Used  Substance Use Topics  . Alcohol use: Yes    Comment: occ  . Drug use: No    FAMILY HISTORY: Family History  Problem Relation Age of Onset  . Diabetes Father   . Hypertension Father   . Heart disease Father   . Sudden death Father   . Hyperlipidemia Mother   . Hypertension Mother   . Obesity Mother   . Cancer Maternal Grandmother        lung  . Cancer Maternal Grandfather        prostate    ROS: Review of Systems  Constitutional: Positive for weight loss.  Gastrointestinal: Negative for nausea and vomiting.  Musculoskeletal:       Negative for muscle weakness  Endo/Heme/Allergies:       Negative for polyphagia  Psychiatric/Behavioral: Positive for depression. Negative for suicidal ideas.    PHYSICAL EXAM: Pt in no acute distress  RECENT LABS AND TESTS: BMET    Component Value Date/Time   NA 142 10/19/2018 0839   K 4.8 10/19/2018 0839   CL 104 10/19/2018 0839   CO2 20 10/19/2018 0839    GLUCOSE 127 (H) 10/19/2018 0839   GLUCOSE 150 (H) 08/08/2017 0348   BUN 14 10/19/2018 0839  CREATININE 0.88 10/19/2018 0839   CALCIUM 9.1 10/19/2018 0839   GFRNONAA 84 10/19/2018 0839   GFRAA 96 10/19/2018 0839   Lab Results  Component Value Date   HGBA1C 6.2 (H) 10/19/2018   HGBA1C 6.2 (H) 06/12/2018   HGBA1C 7.1 (H) 08/01/2017   HGBA1C 7.4 (H) 07/25/2017   HGBA1C 7.4 (H) 07/24/2017   Lab Results  Component Value Date   INSULIN 12.3 10/19/2018   INSULIN 16.3 06/12/2018   CBC    Component Value Date/Time   WBC 6.8 09/14/2017 0949   WBC 9.6 08/08/2017 0348   RBC 5.08 09/14/2017 0949   RBC 2.89 (L) 08/08/2017 0348   HGB 13.3 09/14/2017 0949   HCT 39.9 09/14/2017 0949   PLT 320 09/14/2017 0949   MCV 79 09/14/2017 0949   MCH 26.2 (L) 09/14/2017 0949   MCH 27.3 08/08/2017 0348   MCHC 33.3 09/14/2017 0949   MCHC 33.3 08/08/2017 0348   RDW 13.8 09/14/2017 0949   LYMPHSABS 1.8 07/24/2017 0518   MONOABS 0.7 07/24/2017 0518   EOSABS 0.1 07/24/2017 0518   BASOSABS 0.0 07/24/2017 0518   Iron/TIBC/Ferritin/ %Sat No results found for: IRON, TIBC, FERRITIN, IRONPCTSAT Lipid Panel     Component Value Date/Time   CHOL 112 06/12/2018 0808   TRIG 66 06/12/2018 0808   HDL 37 (L) 06/12/2018 0808   CHOLHDL 2.7 03/16/2018 0936   CHOLHDL 3.6 07/26/2017 0250   VLDL 25 07/26/2017 0250   LDLCALC 62 06/12/2018 0808   Hepatic Function Panel     Component Value Date/Time   PROT 6.4 10/19/2018 0839   ALBUMIN 3.8 10/19/2018 0839   AST 16 10/19/2018 0839   ALT 13 10/19/2018 0839   ALKPHOS 77 10/19/2018 0839   BILITOT 0.7 10/19/2018 0839   BILIDIR 0.14 03/16/2018 0936      Component Value Date/Time   TSH 1.560 06/12/2018 0808    Results for DIAVION, LABRADOR (MRN [id number]) as of 01/03/2019 12:57  Ref. Range 10/19/2018 08:39  Vitamin D, 25-Hydroxy Latest Ref Range: 30.0 - 100.0 ng/mL 57.8    I, Doreene Nest, am acting as Location manager for [employer]. Owens Shark, DO  I have  reviewed the above documentation for accuracy and completeness, and I agree with the above. -Jearld Lesch, DO

## 2019-01-11 ENCOUNTER — Other Ambulatory Visit: Payer: Self-pay | Admitting: Interventional Cardiology

## 2019-01-17 ENCOUNTER — Ambulatory Visit (INDEPENDENT_AMBULATORY_CARE_PROVIDER_SITE_OTHER): Payer: BC Managed Care – PPO | Admitting: Bariatrics

## 2019-01-17 ENCOUNTER — Encounter (INDEPENDENT_AMBULATORY_CARE_PROVIDER_SITE_OTHER): Payer: Self-pay | Admitting: Bariatrics

## 2019-01-17 ENCOUNTER — Other Ambulatory Visit: Payer: Self-pay

## 2019-01-17 DIAGNOSIS — R7303 Prediabetes: Secondary | ICD-10-CM | POA: Diagnosis not present

## 2019-01-17 DIAGNOSIS — F3289 Other specified depressive episodes: Secondary | ICD-10-CM | POA: Diagnosis not present

## 2019-01-17 DIAGNOSIS — E66813 Obesity, class 3: Secondary | ICD-10-CM

## 2019-01-17 DIAGNOSIS — Z6841 Body Mass Index (BMI) 40.0 and over, adult: Secondary | ICD-10-CM

## 2019-01-17 DIAGNOSIS — E559 Vitamin D deficiency, unspecified: Secondary | ICD-10-CM

## 2019-01-18 NOTE — Progress Notes (Signed)
Office: [phone]  /  Fax: [phone] TeleHealth Visit:  [name] has verbally consented to this TeleHealth visit today. The patient is located at home, the provider is located at the [employer] [facility] office. The participants in this visit include the listed provider and patient and any and all parties involved. The visit was conducted today via WebEx.  HPI:   Chief Complaint: OBESITY [name] is here to discuss her progress with her obesity treatment plan. She is on the Category 2 plan and is following her eating plan approximately 65 % of the time. She states she is walking for 20 to 30 minutes 2 times per week and she is doing low impact workout for 18 to 20 minutes 2 times per week. [name] has lost 3 pounds (weight 258 lbs). She is in the same range. She is getting adequate protein (decreased hunger). She denies any stress eating. We were unable to weigh the patient today for this TeleHealth visit. She feels as if she has lost weight since her last visit. She has lost 2 lbs since starting treatment with [name].  Vitamin D deficiency [name] has a diagnosis of vitamin D deficiency. She is currently taking vit D and denies nausea, vomiting or muscle weakness.  Pre-Diabetes [name] has a diagnosis of prediabetes based on her elevated Hgb A1c and was informed this puts her at greater risk of developing diabetes. She is taking metformin currently and continues to work on diet and exercise to decrease risk of diabetes. She denies polyphagia.  Depression with emotional eating behaviors [name] is struggling with emotional eating and using food for comfort to the extent that it is negatively impacting her health. She often snacks when she is not hungry. [name] sometimes feels she is out of control and then feels guilty that she made poor food choices. [name] sees Dr. Mallie Mussel (bariatric psychologist). She has been working on behavior modification techniques to help reduce her  emotional eating and has been somewhat successful. She shows no sign of suicidal or homicidal ideations.  Depression screen [name] 2/9 12/13/2018 11/23/2018 11/08/2018 10/23/2018 10/09/2018  Decreased Interest 0 0 0 0 0  Down, Depressed, Hopeless 0 0 0 0 0  PHQ - 2 Score 0 0 0 0 0  Altered sleeping 0 0 0 0 0  Tired, decreased energy 0 1 1 0 0  Change in appetite 0 0 0 0 0  Feeling bad or failure about yourself  0 0 0 0 0  Trouble concentrating 0 0 0 0 0  Moving slowly or fidgety/restless 0 0 0 0 0  Suicidal thoughts 0 0 0 0 0  PHQ-9 Score 0 1 1 0 0  Difficult doing work/chores - - - - -    ASSESSMENT AND PLAN:  Vitamin D deficiency  Prediabetes  Other depression - with emotional eating  Class 3 severe obesity with serious comorbidity and body mass index (BMI) of 40.0 to 44.9 in adult, unspecified obesity type (HCC)  PLAN:  Vitamin D Deficiency [name] was informed that low vitamin D levels contributes to fatigue and are associated with obesity, breast, and colon cancer. She will continue to take prescription Vit D @50 ,000 IU every other week and will follow up for routine testing of vitamin D, at least 2-3 times per year. She was informed of the risk of over-replacement of vitamin D and agrees to not increase her dose unless she discusses this with [name] first.  Pre-Diabetes [name] will continue to work on  weight loss, exercise, and decreasing simple carbohydrates in her diet to help decrease the risk of diabetes. We dicussed metformin including benefits and risks. She was informed that eating too many simple carbohydrates or too many calories at one sitting increases the likelihood of GI side effects. [name] will continue metformin for now and a prescription was not written today. [name] agreed to follow up with [name] as directed to monitor her progress.  Depression with Emotional Eating Behaviors We discussed behavior modification techniques today to help [name] deal with her emotional  eating and depression. [name] will continue to see Dr. Mallie Mussel and will follow up as directed.  Obesity [name] is currently in the action stage of change. As such, her goal is to continue with weight loss efforts She has agreed to follow the Category 2 plan with breakfast options [name] will continue her exercise regimen for weight loss and overall health benefits. We discussed the following Behavioral Modification Strategies today: increase H2O intake, no skipping meals, keeping healthy foods in the home, increasing lean protein intake, decreasing simple carbohydrates, increasing vegetables, decrease eating out and work on meal planning and easy cooking plans [name] will weigh herself at home before each visit.  [name] has agreed to follow up with our clinic in 2 weeks. She was informed of the importance of frequent follow up visits to maximize her success with intensive lifestyle modifications for her multiple health conditions.  ALLERGIES: No Known Allergies  MEDICATIONS: Current Outpatient Medications on File Prior to Visit  Medication Sig Dispense Refill   acetaminophen (TYLENOL) 500 MG tablet Take 500 mg by mouth every 6 (six) hours as needed for mild pain or headache.     aspirin EC 81 MG EC tablet Take 1 tablet (81 mg total) daily by mouth.     atorvastatin (LIPITOR) 40 MG tablet Take 1 tablet (40 mg total) by mouth daily. 90 tablet 3   clopidogrel (PLAVIX) 75 MG tablet Take 1 tablet (75 mg total) by mouth daily. 90 tablet 0   Coenzyme Q10 (COQ10) 100 MG CAPS Take 100 mg by mouth daily.     colchicine 0.6 MG tablet Take two (2) tablets by mouth at time of initial pain. Then take one (1) tablet by mouth twice daily as needed for gout.     Dulaglutide (TRULICITY) 1.5 YI/5.0YD SOPN Inject 1 Dose into the skin once a week.     metFORMIN (GLUCOPHAGE) 500 MG tablet Take 500 mg by mouth 2 (two) times daily with a meal.     metoprolol tartrate (LOPRESSOR) 25 MG tablet TAKE 1  TABLET(25 MG) BY MOUTH TWICE DAILY 180 tablet 2   Misc Natural Products (DAILY HERBS BONE/JOINTS PO) Take 3-5 tablets daily by mouth. Insta flex     olmesartan (BENICAR) 5 MG tablet TAKE 1 TABLET(5 MG) BY MOUTH DAILY 90 tablet 2   Vitamin D, Ergocalciferol, [name] Take 1 capsule (50,000 Units total) by mouth every 14 (fourteen) days. 2 capsule 0   No current facility-administered medications on file prior to visit.     PAST MEDICAL HISTORY: Past Medical History:  Diagnosis Date   Anxiety    Back pain    Chest pain    Coronary artery disease    Diabetes mellitus    Difficulty swallowing pills    Fatigue    GERD (gastroesophageal reflux disease)    Hay fever    Headache    Heartburn    History of heart attack  HTN (hypertension)    Hyperlipidemia    Joint pain    Knee pain    Nervousness    Palpitations    Shortness of breath    Shortness of breath on exertion    Stress     PAST SURGICAL HISTORY: Past Surgical History:  Procedure Laterality Date   CORONARY ARTERY BYPASS GRAFT N/A 08/02/2017   Procedure: CORONARY ARTERY BYPASS GRAFTING (CABG) X 5 (LIMA to LAD, LEFT RADIAL ARTERY to DIAGONAL, SVG to SEQUENTIALLY PDA and PLB) , USING LEFT INTERNAL MAMMARY ARTERY, LEFT RADIAL ARTERY, AND  SAPHENOUS VEIN to [place], RIGHT GREATER SAPHENOUS VEIN HARVESTED ENDOSCOPICALLY;  Surgeon: Grace Isaac, MD;  Location: [facility];  Service: Open Heart Surgery;  Laterality: N/A;   CORONARY/GRAFT ACUTE MI REVASCULARIZATION N/A 07/24/2017   Procedure: Coronary/Graft Acute MI Revascularization;  Surgeon: Belva Crome, MD;  Location: [facility];  Service: Cardiovascular;  Laterality: N/A;   LAPAROSCOPIC GASTRIC BANDING  09/02/2008   LEFT HEART CATH AND CORONARY ANGIOGRAPHY N/A 07/24/2017   Procedure: LEFT HEART CATH AND CORONARY ANGIOGRAPHY;  Surgeon: Belva Crome, MD;  Location: [facility];  Service:  Cardiovascular;  Laterality: N/A;   RADIAL ARTERY HARVEST Left 08/02/2017   Procedure: LEFT RADIAL ARTERY HARVEST;  Surgeon: Grace Isaac, MD;  Location: [facility];  Service: Open Heart Surgery;  Laterality: Left;   STERNAL CLOSURE N/A 08/02/2017   Procedure: STERNAL PLATING;  Surgeon: Grace Isaac, MD;  Location: [facility];  Service: Open Heart Surgery;  Laterality: N/A;   STOMACH SURGERY  1982   had metal removed from stomach as a child   TEE WITHOUT CARDIOVERSION N/A 08/02/2017   Procedure: TRANSESOPHAGEAL ECHOCARDIOGRAM (TEE);  Surgeon: Grace Isaac, MD;  Location: [facility];  Service: Open Heart Surgery;  Laterality: N/A;    SOCIAL HISTORY: Social History   Tobacco Use   Smoking status: Never Smoker   Smokeless tobacco: Never Used  Substance Use Topics   Alcohol use: Yes    Comment: occ   Drug use: No    FAMILY HISTORY: Family History  Problem Relation Age of Onset   Diabetes Father    Hypertension Father    Heart disease Father    Sudden death Father    Hyperlipidemia Mother    Hypertension Mother    Obesity Mother    Cancer Maternal Grandmother        lung   Cancer Maternal Grandfather        prostate    ROS: Review of Systems  Constitutional: Positive for weight loss.  Gastrointestinal: Negative for nausea and vomiting.  Musculoskeletal:       Negative for muscle weakness  Endo/Heme/Allergies:       Negative for polyphagia  Psychiatric/Behavioral: Positive for depression. Negative for suicidal ideas.    PHYSICAL EXAM: Pt in no acute distress  RECENT LABS AND TESTS: BMET    Component Value Date/Time   NA 142 10/19/2018 0839   K 4.8 10/19/2018 0839   CL 104 10/19/2018 0839   CO2 20 10/19/2018 0839   GLUCOSE 127 (H) 10/19/2018 0839   GLUCOSE 150 (H) 08/08/2017 0348   BUN 14 10/19/2018 0839   CREATININE 0.88 10/19/2018 0839   CALCIUM 9.1 10/19/2018 0839   GFRNONAA 84 10/19/2018 0839   GFRAA 96 10/19/2018 0839   Lab  Results  Component Value Date   HGBA1C 6.2 (H) 10/19/2018   HGBA1C 6.2 (H) 06/12/2018   HGBA1C 7.1 (H) 08/01/2017  HGBA1C 7.4 (H) 07/25/2017   HGBA1C 7.4 (H) 07/24/2017   Lab Results  Component Value Date   INSULIN 12.3 10/19/2018   INSULIN 16.3 06/12/2018   CBC    Component Value Date/Time   WBC 6.8 09/14/2017 0949   WBC 9.6 08/08/2017 0348   RBC 5.08 09/14/2017 0949   RBC 2.89 (L) 08/08/2017 0348   HGB 13.3 09/14/2017 0949   HCT 39.9 09/14/2017 0949   PLT 320 09/14/2017 0949   MCV 79 09/14/2017 0949   MCH 26.2 (L) 09/14/2017 0949   MCH 27.3 08/08/2017 0348   MCHC 33.3 09/14/2017 0949   MCHC 33.3 08/08/2017 0348   RDW 13.8 09/14/2017 0949   LYMPHSABS 1.8 07/24/2017 0518   MONOABS 0.7 07/24/2017 0518   EOSABS 0.1 07/24/2017 0518   BASOSABS 0.0 07/24/2017 0518   Iron/TIBC/Ferritin/ %Sat No results found for: IRON, TIBC, FERRITIN, IRONPCTSAT Lipid Panel     Component Value Date/Time   CHOL 112 06/12/2018 0808   TRIG 66 06/12/2018 0808   HDL 37 (L) 06/12/2018 0808   CHOLHDL 2.7 03/16/2018 0936   CHOLHDL 3.6 07/26/2017 0250   VLDL 25 07/26/2017 0250   LDLCALC 62 06/12/2018 0808   Hepatic Function Panel     Component Value Date/Time   PROT 6.4 10/19/2018 0839   ALBUMIN 3.8 10/19/2018 0839   AST 16 10/19/2018 0839   ALT 13 10/19/2018 0839   ALKPHOS 77 10/19/2018 0839   BILITOT 0.7 10/19/2018 0839   BILIDIR 0.14 03/16/2018 0936      Component Value Date/Time   TSH 1.560 06/12/2018 0808     Ref. Range 10/19/2018 08:39  Vitamin D, 25-Hydroxy Latest Ref Range: 30.0 - 100.0 ng/mL 57.8    I, Doreene Nest, am acting as Location manager for [employer]. Owens Shark, DO

## 2019-01-24 ENCOUNTER — Other Ambulatory Visit: Payer: Self-pay

## 2019-01-24 ENCOUNTER — Ambulatory Visit (INDEPENDENT_AMBULATORY_CARE_PROVIDER_SITE_OTHER): Payer: BC Managed Care – PPO | Admitting: Psychology

## 2019-01-24 DIAGNOSIS — F3289 Other specified depressive episodes: Secondary | ICD-10-CM

## 2019-01-24 NOTE — Progress Notes (Signed)
Office: [phone]  /  Fax: [phone]    Date: Jan 24, 2019   Appointment Start Time: 11:34am Duration: 31 minutes Provider: Glennie Isle, Psy.D. Type of Session: Individual Therapy  Location of Patient: Home Location of Provider: Provider's Home Type of Contact: Telepsychological Visit via [employer] WebEx   Session Content: Emmalia is a 38 y.o. female presenting via [place] for a follow-up appointment to address the previously established treatment goal of decreasing emotional eating. Today's appointment was a telepsychological visit, as this provider's clinic is closed for in-person visits due to COVID-19. Therapeutic services will resume to in-person appointments once the clinic re-opens. Dwight expressed understanding regarding the rationale for telepsychological services, and provided verbal consent for today's appointment. Prior to proceeding with today's appointment, Lynee's physical location at the time of this appointment was obtained. Jenay reported she was at home and provided the address. In the event of technical difficulties, Kalina shared a phone number she could be reached at. Shulamis and this provider participated in today's telepsychological service. Also, Mackenna denied anyone else being present in the room or on the WebEx appointment.  Notably, this provider called [place] at 11:32am as she did not present for the WebEx appointment. She discussed experiencing technical difficulties; therefore, the appointment was initiated four minutes late. Mid-way through today's appointment, Hadja reported difficulty with audio then video connection; therefore, for approximately six minutes of the appointment, there was only audio connection on Rosalyn's end as she had called into the WebEx appointmen. However, for the remainder of the appointment, there were video and audio capabilities again.   This provider conducted a brief check-in and verbally administered the PHQ-9 and  GAD-7. Rhona shared last week she was "off the diet a little bit." She discussed celebrating a friend's birthday and had dinner with her mother. Sameka's meals for the remainder of the day on the days she ate out were explored. Deyna acknowledged not eating for the entire day because she "knew [she] was going to be eating bad." As such, this provider discussed the importance of eating congruent to the meal plan for the remainder of the meals on days there may be deviations. More specifically,  psychoeducation regarding the impact of protein intake on fullness was discussed. This provider also reviewed the hunger and satisfaction scale as it relates to eating out and/or with others to help Malayjah increase awareness of how hungry she is to further assist with protein intake. Moreover, this provider and Adylynn discussed other ways to slow down eating, as it takes approximately 15-20 minutes for the mind and stomach to communicate fullness cues. Davine was receptive to sitting down while eating and avoiding other activities when eating to slow down eating and increase awareness of hunger levels. This provider and Morgyn further discussed termination planning. At the onset of therapeutic services the duration of treatment was discussed to be a total of 4 to 6 sessions and today was scheduled to be the last appointment with this provider; however, due to the pandemic, this provider will continue meeting with Lygia as deemed necessary and appropriate. Onyinyechi verbally acknowledged understanding of the aforementioned, and was receptive to double-checking insurance benefits. Kymberli was receptive to today's session as evidenced by openness to sharing, responsiveness to feedback, and willingness to engage in dicussed strategies.  Mental Status Examination:  Appearance: neat Behavior: cooperative Mood: euthymic Affect: mood congruent Speech: normal in rate, volume, and tone Eye Contact: appropriate  Psychomotor Activity: appropriate Thought Process: linear, logical, and goal directed  Content/Perceptual Disturbances: denies suicidal and homicidal ideation, plan, and intent and no hallucinations, delusions, bizarre thinking or behavior reported or observed Orientation: time, person, place and purpose of appointment Cognition/Sensorium: memory, attention, language, and fund of knowledge intact  Insight: good Judgment: good  Structured Assessment Results: The Patient Health Questionnaire-9 (PHQ-9) is a self-report measure that assesses symptoms and severity of depression over the course of the last two weeks. Analee obtained a score of 1 suggesting minimal depression. Meron finds the endorsed symptoms to be not difficult at all. Depression screen [facility] 2/9 01/24/2019  Decreased Interest 0  Down, Depressed, Hopeless 0  PHQ - 2 Score 0  Altered sleeping 0  Tired, decreased energy 0  Change in appetite 0  Feeling bad or failure about yourself  0  Trouble concentrating 1  Moving slowly or fidgety/restless 0  Suicidal thoughts 0  PHQ-9 Score 1  Difficult doing work/chores -   The Generalized Anxiety Disorder-7 (GAD-7) is a brief self-report measure that assesses symptoms of anxiety over the course of the last two weeks. Terrie obtained a score of 0. GAD 7 : Generalized Anxiety Score 01/24/2019  Nervous, Anxious, on Edge 0  Control/stop worrying 0  Worry too much - different things 0  Trouble relaxing 0  Restless 0  Easily annoyed or irritable 0  Afraid - awful might happen 0  Total GAD 7 Score 0  Anxiety Difficulty -   Interventions:  Administered PHQ-9 and GAD-7 for symptom monitoring Reviewed content from the previous session Provided empathic reflections and validation Discussed termination planning Provided positive reinforcement Psychoeducation provided regarding protein intake  Employed supportive psychotherapy interventions today to facilitate reduced distress related to  eating  Introduced behavioral techniques to reduce overeating Conducted a brief chart review  DSM-5 Diagnosis: 311 (F32.8) Other Specified Depressive Disorder, Emotional Eating  Treatment Goal & Progress: During the initial appointment with this provider, the following treatment goal was established: decrease emotional eating. Rosenda has demonstrated progress in her goal as evidenced by increased awareness of hunger patterns and triggers for emotional eating. Ziyon continues to demonstrate willingness to engage in learned skills to increase coping and subsequent emotional eating.   Plan: Phynix continues to appear able and willing to participate as evidenced by engagement in reciprocal conversation, and asking questions for clarification as appropriate. The next appointment will be scheduled in three weeks, which will be via [employer]. Once this provider's office resumes in-person appointments, Klynn will be notified. The next session will focus on mindfulness.

## 2019-01-31 ENCOUNTER — Ambulatory Visit (INDEPENDENT_AMBULATORY_CARE_PROVIDER_SITE_OTHER): Payer: BC Managed Care – PPO | Admitting: Bariatrics

## 2019-01-31 ENCOUNTER — Other Ambulatory Visit: Payer: Self-pay

## 2019-01-31 DIAGNOSIS — E559 Vitamin D deficiency, unspecified: Secondary | ICD-10-CM | POA: Diagnosis not present

## 2019-01-31 DIAGNOSIS — R7303 Prediabetes: Secondary | ICD-10-CM

## 2019-01-31 DIAGNOSIS — Z6841 Body Mass Index (BMI) 40.0 and over, adult: Secondary | ICD-10-CM

## 2019-02-01 MED ORDER — VITAMIN D (ERGOCALCIFEROL) 1.25 MG (50000 UNIT) PO CAPS: 50000.0000 [IU] | ORAL_CAPSULE | ORAL | 0 refills | Status: DC

## 2019-02-01 NOTE — Progress Notes (Signed)
Office: [phone]  /  Fax: [phone] TeleHealth Visit:  [name] has verbally consented to this TeleHealth visit today. The patient is located at home, the provider is located at the [employer] [facility] office. The participants in this visit include the listed provider and patient and any and all parties involved. The visit was conducted today via WebEx.  HPI:   Chief Complaint: OBESITY [name] is here to discuss her progress with her obesity treatment plan. She is on the Category 2 plan and is following her eating plan approximately 70 % of the time. She states she is walking 30 minutes 2 times per week. [name] states that she is up a few pounds. She had been off the diet slightly. She is back on the "plan". We were unable to weigh the patient today for this TeleHealth visit. She feels as if she has gained weight since her last visit.   Pre-Diabetes [name] has a diagnosis of prediabetes based on her elevated Hgb A1c and was informed this puts her at greater risk of developing diabetes. Her last A1c was at 5.9 and last insulin level was at 6.4 She is taking metformin currently and continues to work on diet and exercise to decrease risk of diabetes. She denies nausea or hypoglycemia.  Vitamin D deficiency [name] has a diagnosis of vitamin D deficiency. She is currently taking vit D and denies nausea, vomiting or muscle weakness.  ASSESSMENT AND PLAN:  Vitamin D deficiency - Plan: Vitamin D, Ergocalciferol, [name]  Prediabetes  Class 3 severe obesity with serious comorbidity and body mass index (BMI) of 40.0 to 44.9 in adult, unspecified obesity type ([place])  PLAN:  Pre-Diabetes [name] will continue to work on weight loss, exercise, and decreasing simple carbohydrates in her diet to help decrease the risk of diabetes. We dicussed metformin including benefits and risks. She was informed that eating too many simple carbohydrates  or too many calories at one sitting increases the likelihood of GI side effects. [name] will continue metformin for now and a prescription was not written today. [name] agreed to follow up with [name] as directed to monitor her progress.  Vitamin D Deficiency [name] was informed that low vitamin D levels contributes to fatigue and are associated with obesity, breast, and colon cancer. She agrees to continue to take prescription Vit D @50 ,000 IU every 14 days #2 with no refills and will follow up for routine testing of vitamin D, at least 2-3 times per year. She was informed of the risk of over-replacement of vitamin D and agrees to not increase her dose unless she discusses this with [name] first. [name] agrees to follow up with our clinic in 2 weeks.  Obesity [name] is currently in the action stage of change. As such, her goal is to continue with weight loss efforts She has agreed to follow the Category 2 plan [name] will continue exercise and she will increase exercise for weight loss and overall health benefits. We discussed the following Behavioral Modification Strategies today: planning for success, increase H2O intake, no skipping meals, keeping healthy foods in the home, increasing lean protein intake, decreasing simple carbohydrates , increasing vegetables, decrease eating out, work on meal planning and easy cooking plans and avoiding temptations [name] will weigh herself at home and record before each visit (has scales).  [name] has agreed to follow up with our clinic in 2 weeks. She was informed of the importance of frequent follow up visits  to maximize her success with intensive lifestyle modifications for her multiple health conditions.  ALLERGIES: No Known Allergies  MEDICATIONS: Current Outpatient Medications on File Prior to Visit  Medication Sig Dispense Refill  . acetaminophen (TYLENOL) 500 MG tablet Take 500 mg by mouth every 6 (six) hours as needed for mild pain or headache.     Marland Kitchen aspirin EC 81 MG EC tablet Take 1 tablet (81 mg total) daily by mouth.    Marland Kitchen atorvastatin (LIPITOR) 40 MG tablet Take 1 tablet (40 mg total) by mouth daily. 90 tablet 3  . clopidogrel (PLAVIX) 75 MG tablet Take 1 tablet (75 mg total) by mouth daily. 90 tablet 0  . Coenzyme Q10 (COQ10) 100 MG CAPS Take 100 mg by mouth daily.    . colchicine 0.6 MG tablet Take two (2) tablets by mouth at time of initial pain. Then take one (1) tablet by mouth twice daily as needed for gout.    . Dulaglutide (TRULICITY) 1.5 FX/9.0WI SOPN Inject 1 Dose into the skin once a week.    . metFORMIN (GLUCOPHAGE) 500 MG tablet Take 500 mg by mouth 2 (two) times daily with a meal.    . metoprolol tartrate (LOPRESSOR) 25 MG tablet TAKE 1 TABLET(25 MG) BY MOUTH TWICE DAILY 180 tablet 2  . Misc Natural Products (DAILY HERBS BONE/JOINTS PO) Take 3-5 tablets daily by mouth. Insta flex    . olmesartan (BENICAR) 5 MG tablet TAKE 1 TABLET(5 MG) BY MOUTH DAILY 90 tablet 2  . Vitamin D, Ergocalciferol, [name] Take 1 capsule (50,000 Units total) by mouth every 14 (fourteen) days. 2 capsule 0   No current facility-administered medications on file prior to visit.     PAST MEDICAL HISTORY: Past Medical History:  Diagnosis Date  . Anxiety   . Back pain   . Chest pain   . Coronary artery disease   . Diabetes mellitus   . Difficulty swallowing pills   . Fatigue   . GERD (gastroesophageal reflux disease)   . Hay fever   . Headache   . Heartburn   . History of heart attack   . HTN (hypertension)   . Hyperlipidemia   . Joint pain   . Knee pain   . Nervousness   . Palpitations   . Shortness of breath   . Shortness of breath on exertion   . Stress     PAST SURGICAL HISTORY: Past Surgical History:  Procedure Laterality Date  . CORONARY ARTERY BYPASS GRAFT N/A 08/02/2017   Procedure: CORONARY ARTERY BYPASS GRAFTING (CABG) X 5 (LIMA to LAD, LEFT RADIAL ARTERY to DIAGONAL, SVG to  SEQUENTIALLY PDA and PLB) , USING LEFT INTERNAL MAMMARY ARTERY, LEFT RADIAL ARTERY, AND  SAPHENOUS VEIN to [place], RIGHT GREATER SAPHENOUS VEIN HARVESTED ENDOSCOPICALLY;  Surgeon: Grace Isaac, MD;  Location: [facility];  Service: Open Heart Surgery;  Laterality: N/A;  . CORONARY/GRAFT ACUTE MI REVASCULARIZATION N/A 07/24/2017   Procedure: Coronary/Graft Acute MI Revascularization;  Surgeon: Belva Crome, MD;  Location: [place] [facility];  Service: Cardiovascular;  Laterality: N/A;  . LAPAROSCOPIC GASTRIC BANDING  09/02/2008  . LEFT HEART CATH AND CORONARY ANGIOGRAPHY N/A 07/24/2017   Procedure: LEFT HEART CATH AND CORONARY ANGIOGRAPHY;  Surgeon: Belva Crome, MD;  Location: [place] [facility];  Service: Cardiovascular;  Laterality: N/A;  . RADIAL ARTERY HARVEST Left 08/02/2017   Procedure: LEFT RADIAL ARTERY HARVEST;  Surgeon: Grace Isaac, MD;  Location:  [place] [facility];  Service: Open Heart Surgery;  Laterality: Left;  . STERNAL CLOSURE N/A 08/02/2017   Procedure: STERNAL PLATING;  Surgeon: Grace Isaac, MD;  [place];  Service: Open Heart Surgery;  Laterality: N/A;  . [place]   had metal removed from stomach as a child  . TEE WITHOUT CARDIOVERSION N/A 08/02/2017   Procedure: TRANSESOPHAGEAL ECHOCARDIOGRAM (TEE);  Surgeon: Grace Isaac, MD;  Location: [place];  Service: Open Heart Surgery;  Laterality: N/A;    SOCIAL HISTORY: Social History   Tobacco Use  . Smoking status: Never Smoker  . Smokeless tobacco: Never Used  Substance Use Topics  . Alcohol use: Yes    Comment: occ  . Drug use: No    FAMILY HISTORY: Family History  Problem Relation Age of Onset  . Diabetes Father   . Hypertension Father   . Heart disease Father   . Sudden death Father   . Hyperlipidemia Mother   . Hypertension Mother   . Obesity Mother   . Cancer Maternal Grandmother        lung  . Cancer Maternal Grandfather        prostate    ROS: Review of Systems   Constitutional: Negative for weight loss.  Gastrointestinal: Negative for nausea and vomiting.  Musculoskeletal:       Negative for muscle weakness  Endo/Heme/Allergies:       Negative for hypoglycemia    PHYSICAL EXAM: Pt in no acute distress  RECENT LABS AND TESTS: BMET    Component Value Date/Time   NA 142 10/19/2018 0839   K 4.8 10/19/2018 0839   CL 104 10/19/2018 0839   CO2 20 10/19/2018 0839   GLUCOSE 127 (H) 10/19/2018 0839   GLUCOSE 150 (H) 08/08/2017 0348   BUN 14 10/19/2018 0839   CREATININE 0.88 10/19/2018 0839   CALCIUM 9.1 10/19/2018 0839   GFRNONAA 84 10/19/2018 0839   GFRAA 96 10/19/2018 0839   Lab Results  Component Value Date   HGBA1C 6.2 (H) 10/19/2018   HGBA1C 6.2 (H) 06/12/2018   HGBA1C 7.1 (H) 08/01/2017   HGBA1C 7.4 (H) 07/25/2017   HGBA1C 7.4 (H) 07/24/2017   Lab Results  Component Value Date   INSULIN 12.3 10/19/2018   INSULIN 16.3 06/12/2018   CBC    Component Value Date/Time   WBC 6.8 09/14/2017 0949   WBC 9.6 08/08/2017 0348   RBC 5.08 09/14/2017 0949   RBC 2.89 (L) 08/08/2017 0348   HGB 13.3 09/14/2017 0949   HCT 39.9 09/14/2017 0949   PLT 320 09/14/2017 0949   MCV 79 09/14/2017 0949   MCH 26.2 (L) 09/14/2017 0949   MCH 27.3 08/08/2017 0348   MCHC 33.3 09/14/2017 0949   MCHC 33.3 08/08/2017 0348   RDW 13.8 09/14/2017 0949   LYMPHSABS 1.8 07/24/2017 0518   MONOABS 0.7 07/24/2017 0518   EOSABS 0.1 07/24/2017 0518   BASOSABS 0.0 07/24/2017 0518   Iron/TIBC/Ferritin/ %Sat No results found for: IRON, TIBC, FERRITIN, IRONPCTSAT Lipid Panel     Component Value Date/Time   CHOL 112 06/12/2018 0808   TRIG 66 06/12/2018 0808   HDL 37 (L) 06/12/2018 0808   CHOLHDL 2.7 03/16/2018 0936   CHOLHDL 3.6 07/26/2017 0250   VLDL 25 07/26/2017 0250   LDLCALC 62 06/12/2018 0808   Hepatic Function Panel     Component Value Date/Time   PROT 6.4 10/19/2018 0839   ALBUMIN 3.8 10/19/2018 0839   AST 16 10/19/2018  0839   ALT 13  10/19/2018 0839   ALKPHOS 77 10/19/2018 0839   BILITOT 0.7 10/19/2018 0839   BILIDIR 0.14 03/16/2018 0936      Component Value Date/Time   TSH 1.560 06/12/2018 0808     Ref. Range 10/19/2018 08:39  Vitamin D, 25-Hydroxy Latest Ref Range: 30.0 - 100.0 ng/mL 57.8    I, Doreene Nest, am acting as Location manager for [employer]. Owens Shark, DO  I have reviewed the above documentation for accuracy and completeness, and I agree with the above. -Jearld Lesch, DO

## 2019-02-05 ENCOUNTER — Encounter (INDEPENDENT_AMBULATORY_CARE_PROVIDER_SITE_OTHER): Payer: Self-pay | Admitting: Bariatrics

## 2019-02-14 ENCOUNTER — Ambulatory Visit (INDEPENDENT_AMBULATORY_CARE_PROVIDER_SITE_OTHER): Payer: Self-pay | Admitting: Psychology

## 2019-02-15 ENCOUNTER — Ambulatory Visit (INDEPENDENT_AMBULATORY_CARE_PROVIDER_SITE_OTHER): Payer: BC Managed Care – PPO | Admitting: Bariatrics

## 2019-02-26 ENCOUNTER — Other Ambulatory Visit: Payer: Self-pay | Admitting: Interventional Cardiology

## 2019-02-26 MED ORDER — CLOPIDOGREL BISULFATE 75 MG PO TABS: 75.0000 mg | ORAL_TABLET | Freq: Every day | ORAL | 2 refills | Status: DC

## 2019-02-26 NOTE — Telephone Encounter (Signed)
Pt's medication was sent to pt's pharmacy as requested. Confirmation received.  °

## 2019-03-05 NOTE — Progress Notes (Signed)
Office: [phone]  /  Fax: [phone]    Date: March 06, 2019 Appointment Start Time: 10:04am Duration: 24 minutes Provider: Glennie Isle, Psy.D. Type of Session: Individual Therapy  Location of Patient: Home Location of Provider: [facility] Type of Contact: Telepsychological Visit via [employer] WebEx   Session Content: Caryssa is a 38 y.o. female presenting via [place] for a follow-up appointment to address the previously established treatment goal of decreasing emotional eating. Of note, this provider called Keisha at 10:02am as she did not present for today's appointment. She explained she was unable to locate the e-mail with the secure link for today's appointment; therefore, it was resent. As such, today's appointment was initiated 4 minutes late. Today's appointment was a telepsychological visit, as this provider's clinic is seeing a limited number of patients for in-person visits due to COVID-19. Therapeutic services will resume to in-person appointments once deemed appropriate. Janeth expressed understanding regarding the rationale for telepsychological services, and provided verbal consent for today's appointment. Prior to proceeding with today's appointment, Jeymi's physical location at the time of this appointment was obtained. Deveney reported she was at home and provided the address. In the event of technical difficulties, Jacqueline shared a phone number she could be reached at. [place] and this provider participated in today's telepsychological service. Also, Enisa denied anyone else being present in the room or on the WebEx appointment.  This provider conducted a brief check-in and verbally administered the PHQ-9 and GAD-7. Zharia reported, "The last few weeks of school were crazy." She discussed having to engage in virtual therapy sessions, but is now "done with work." Additionally, she discussed not feeling well due to menstruation. Regarding eating  since the last appointment, Twilla reported, "It's been going pretty good." However, she discussed experiencing cravings when she is mensurating and also decreased appetite at times. It was recommended she discuss the aforementioned with Dr. Owens Shark during today's appointment; [facility] agreed. Since the last appointment with this provider, Haya could not recall episodes of emotional eating and noted a reduction of emotional eating secondary to stress since the onset of treatment with this provider. She acknowledged she has not lost weight, but shared she is maintaining her weight during the pandemic. However, when visiting with family, she discussed eating for pleasure. It was reflected that the aforementioned can be considered emotional eating. Going to her mother's house and eating habits while there were explored. This past weekend, she discussed packing her own snacks and meals to take to her mother's house resulting in her not feeling deprived. It was determined that she was also likely experiencing fatigue the last few weeks due to work resulting in her eating what was provided at her mother's house, whereas last week she had more time to engage in meal prep since school is done for the year. Moreover, Joslyn was engaged in problem solving to reduce emotional eating when going to her mother's house. Abagale was agreeable to meal prepping, speaking with her mother ahead of time regarding food options, and using snack calories when she knows she will be visiting her mother. Furthermore, this provider and Ilene discussed termination planning. Based on Clary's progress, frequency of appointments will be reduced. She shared, "I feel like it is much better than it was before." She also denied stopping at [facility] for snacks. As such, the next appointment will be scheduled in three weeks and if she continues to progress, a termination appointment will be scheduled in 4 weeks. Remainder of  today's  appointment focused on mindfulness. Psychoeducation regarding formal versus informal mindfulness practice was discussed, and she was encouraged to engage in formal mindfulness practice daily using videos on YouTube as previously discussed. Lamika agreed. Overall, Courtne was receptive to today's session as evidenced by openness to sharing, responsiveness to feedback, and willingness to engage in learned skills.  Mental Status Examination:  Appearance: neat Behavior: cooperative Mood: euthymic Affect: mood congruent Speech: normal in rate, volume, and tone Eye Contact: appropriate Psychomotor Activity: appropriate Thought Process: linear, logical, and goal directed  Content/Perceptual Disturbances: denies suicidal and homicidal ideation, plan, and intent and no hallucinations, delusions, bizarre thinking or behavior reported or observed Orientation: time, person, place and purpose of appointment Cognition/Sensorium: memory, attention, language, and fund of knowledge intact  Insight: good Judgment: good  Structured Assessment Results: The Patient Health Questionnaire-9 (PHQ-9) is a self-report measure that assesses symptoms and severity of depression over the course of the last two weeks. Karlisa obtained a score of 0. Little interest or pleasure in doing things 0  Feeling down, depressed, or hopeless 0  Trouble falling or staying asleep, or sleeping too much 0  Feeling tired or having little energy 0  Poor appetite or overeating 0  Feeling bad about yourself --- or that you are a failure or have let yourself or your family down 0  Trouble concentrating on things, such as reading the newspaper or watching television 0  Moving or speaking so slowly that other people could have noticed? Or the opposite --- being so fidgety or restless that you have been moving around a lot more than usual 0  Thoughts that you would be better off dead or hurting yourself in some way 0  PHQ-9 Score 0    The  Generalized Anxiety Disorder-7 (GAD-7) is a brief self-report measure that assesses symptoms of anxiety over the course of the last two weeks. Akeria obtained a score of 0. Feeling nervous, anxious, on edge 0  Not being able to stop or control worrying 0  Worrying too much about different things 0  Trouble relaxing 0  Being so restless that it's hard to sit still 0  Becoming easily annoyed or irritable 0  Feeling afraid as if something awful might happen 0  GAD-7 Score 0   Interventions:  Conducted a brief chart review Verbal administration of PHQ-9 and GAD-7 for symptom monitoring Provided empathic reflections and validation Engaged patient in problem solving Reviewed learned skills Discussed termination planning Provided positive reinforcement Psychoeducation provided regarding formal versus informal mindfulness practice  DSM-5 Diagnosis: 311 (F32.8) Other Specified Depressive Disorder, Emotional Eating  Treatment Goal & Progress: During the initial appointment with this provider, the following treatment goal was established: decrease emotional eating. Janyia has demonstrated progress in her goal as evidenced by increased awareness of hunger patterns and triggers for emotional eating. She also continues to engage in learned skills to assist with coping and noted a reduction in emotional eating since the onset of treatment with this provider.   Plan: Valeria continues to appear able and willing to participate as evidenced by engagement in reciprocal conversation, and asking questions for clarification as appropriate. The next appointment will be scheduled in three weeks, which will be via [employer]. Once this provider's office resumes in-person appointments and it is deemed appropriate, Maysen will be notified. The next session will focus further on mindfulness.

## 2019-03-06 ENCOUNTER — Encounter (INDEPENDENT_AMBULATORY_CARE_PROVIDER_SITE_OTHER): Payer: Self-pay | Admitting: Bariatrics

## 2019-03-06 ENCOUNTER — Ambulatory Visit (INDEPENDENT_AMBULATORY_CARE_PROVIDER_SITE_OTHER): Payer: BC Managed Care – PPO | Admitting: Psychology

## 2019-03-06 ENCOUNTER — Ambulatory Visit (INDEPENDENT_AMBULATORY_CARE_PROVIDER_SITE_OTHER): Payer: BC Managed Care – PPO | Admitting: Bariatrics

## 2019-03-06 ENCOUNTER — Other Ambulatory Visit: Payer: Self-pay

## 2019-03-06 DIAGNOSIS — F3289 Other specified depressive episodes: Secondary | ICD-10-CM | POA: Diagnosis not present

## 2019-03-06 DIAGNOSIS — R7303 Prediabetes: Secondary | ICD-10-CM

## 2019-03-06 DIAGNOSIS — Z6841 Body Mass Index (BMI) 40.0 and over, adult: Secondary | ICD-10-CM

## 2019-03-07 NOTE — Progress Notes (Signed)
Office: [phone]  /  Fax: [phone] TeleHealth Visit:  [name] has verbally consented to this TeleHealth visit today. The patient is located at home, the provider is located at the [employer] [facility] office. The participants in this visit include the listed provider and patient and any and all parties involved. The visit was conducted today via WebEx.  HPI:   Chief Complaint: OBESITY [name] is here to discuss her progress with her obesity treatment plan. She is on the Category 2 plan and is following her eating plan approximately 70 % of the time. She states she is exercising 0 minutes 0 times per week. [name] states that she is unsure if she has lost or gained weight (weight 254 lbs). She has been doing more "impromptu" eating. We were unable to weigh the patient today for this TeleHealth visit. She feels unsure if she has lost or gained weight since her last visit. She has lost 6 lbs since starting treatment with [name].  Pre-Diabetes Madalyn has a diagnosis of prediabetes based on her elevated Hgb A1c and was informed this puts her at greater risk of developing diabetes. [name] is taking Dulaglutide and Xigduo currently and she continues to work on diet and exercise to decrease risk of diabetes. Her last A1c was at 6.2 and last insulin level was at 12.3 She denies nausea or hypoglycemia.  Depression with emotional eating behaviors [name] is struggling with emotional eating and using food for comfort to the extent that it is negatively impacting her health. She often snacks when she is not hungry. [name] sometimes feels she is out of control and then feels guilty that she made poor food choices. [name] is not on medications. She has been working on behavior modification techniques to help reduce her emotional eating and has been somewhat successful. She shows no sign of suicidal or homicidal ideations.  ASSESSMENT AND PLAN:  Prediabetes  Other depression  Class  3 severe obesity with serious comorbidity and body mass index (BMI) of 40.0 to 44.9 in adult, unspecified obesity type ([place])  PLAN:  Pre-Diabetes [name] will continue to work on weight loss, exercise, and decreasing simple carbohydrates in her diet to help decrease the risk of diabetes. We dicussed metformin including benefits and risks. She was informed that eating too many simple carbohydrates or too many calories at one sitting increases the likelihood of GI side effects. Annamarie will continue her medications and follow up with [name] as directed to monitor her progress.  Depression with Emotional Eating Behaviors We discussed behavior modification techniques today to help [name] deal with her emotional eating and depression. [name] will follow up with our clinic in 2 weeks.  Obesity [name] is currently in the action stage of change. As such, her goal is to continue with weight loss efforts She has agreed to follow the Category 2 plan [name] has been instructed to work up to a goal of 150 minutes of combined cardio and strengthening exercise per week for weight loss and overall health benefits. We discussed the following Behavioral Modification Strategies today: increase H2O intake, no skipping meals, keeping healthy foods in the home, increasing lean protein intake, decreasing simple carbohydrates, increasing vegetables, decrease eating out and work on meal planning and intentional eating. [name] will continue to weigh herself at home.  [name] has agreed to follow up with our clinic in 2 weeks. She was informed of the importance of frequent follow up visits to maximize her success with intensive lifestyle modifications for  her multiple health conditions.  ALLERGIES: No Known Allergies  MEDICATIONS: Current Outpatient Medications on File Prior to Visit  Medication Sig Dispense Refill  . acetaminophen (TYLENOL) 500 MG tablet Take 500 mg by mouth every 6 (six) hours as needed for mild  pain or headache.    Marland Kitchen aspirin EC 81 MG EC tablet Take 1 tablet (81 mg total) daily by mouth.    Marland Kitchen atorvastatin (LIPITOR) 40 MG tablet Take 1 tablet (40 mg total) by mouth daily. 90 tablet 3  . clopidogrel (PLAVIX) 75 MG tablet Take 1 tablet (75 mg total) by mouth daily. 90 tablet 2  . Coenzyme Q10 (COQ10) 100 MG CAPS Take 100 mg by mouth daily.    . colchicine 0.6 MG tablet Take two (2) tablets by mouth at time of initial pain. Then take one (1) tablet by mouth twice daily as needed for gout.    . Dulaglutide (TRULICITY) 1.5 HK/7.4QV SOPN Inject 1 Dose into the skin once a week.    . metFORMIN (GLUCOPHAGE) 500 MG tablet Take 500 mg by mouth 2 (two) times daily with a meal.    . metoprolol tartrate (LOPRESSOR) 25 MG tablet TAKE 1 TABLET(25 MG) BY MOUTH TWICE DAILY 180 tablet 2  . Misc Natural Products (DAILY HERBS BONE/JOINTS PO) Take 3-5 tablets daily by mouth. Insta flex    . olmesartan (BENICAR) 5 MG tablet TAKE 1 TABLET(5 MG) BY MOUTH DAILY 90 tablet 2  . Vitamin D, Ergocalciferol, [name] Take 1 capsule (50,000 Units total) by mouth every 14 (fourteen) days. 2 capsule 0   No current facility-administered medications on file prior to visit.     PAST MEDICAL HISTORY: Past Medical History:  Diagnosis Date  . Anxiety   . Back pain   . Chest pain   . Coronary artery disease   . Diabetes mellitus   . Difficulty swallowing pills   . Fatigue   . GERD (gastroesophageal reflux disease)   . Hay fever   . Headache   . Heartburn   . History of heart attack   . HTN (hypertension)   . Hyperlipidemia   . Joint pain   . Knee pain   . Nervousness   . Palpitations   . Shortness of breath   . Shortness of breath on exertion   . Stress     PAST SURGICAL HISTORY: Past Surgical History:  Procedure Laterality Date  . CORONARY ARTERY BYPASS GRAFT N/A 08/02/2017   Procedure: CORONARY ARTERY BYPASS GRAFTING (CABG) X 5 (LIMA to LAD, LEFT RADIAL ARTERY to  DIAGONAL, SVG to SEQUENTIALLY PDA and PLB) , USING LEFT INTERNAL MAMMARY ARTERY, LEFT RADIAL ARTERY, AND  SAPHENOUS VEIN to [place], RIGHT GREATER SAPHENOUS VEIN HARVESTED ENDOSCOPICALLY;  Surgeon: Grace Isaac, MD;  Location: [facility];  Service: Open Heart Surgery;  Laterality: N/A;  . CORONARY/GRAFT ACUTE MI REVASCULARIZATION N/A 07/24/2017   Procedure: Coronary/Graft Acute MI Revascularization;  Surgeon: Belva Crome, MD;  Location: [facility];  Service: Cardiovascular;  Laterality: N/A;  . LAPAROSCOPIC GASTRIC BANDING  09/02/2008  . LEFT HEART CATH AND CORONARY ANGIOGRAPHY N/A 07/24/2017   Procedure: LEFT HEART CATH AND CORONARY ANGIOGRAPHY;  Surgeon: Belva Crome, MD;  Location: [facility];  Service: Cardiovascular;  Laterality: N/A;  . RADIAL ARTERY HARVEST Left 08/02/2017   Procedure: LEFT RADIAL ARTERY HARVEST;  Surgeon: Grace Isaac, MD;  Location: [facility];  Service: Open Heart Surgery;  Laterality:  Left;  . STERNAL CLOSURE N/A 08/02/2017   Procedure: STERNAL PLATING;  Surgeon: Grace Isaac, MD;  Location: [facility];  Service: Open Heart Surgery;  Laterality: N/A;  . [place]   had metal removed from stomach as a child  . TEE WITHOUT CARDIOVERSION N/A 08/02/2017   Procedure: TRANSESOPHAGEAL ECHOCARDIOGRAM (TEE);  Surgeon: Grace Isaac, MD;  Location: [facility];  Service: Open Heart Surgery;  Laterality: N/A;    SOCIAL HISTORY: Social History   Tobacco Use  . Smoking status: Never Smoker  . Smokeless tobacco: Never Used  Substance Use Topics  . Alcohol use: Yes    Comment: occ  . Drug use: No    FAMILY HISTORY: Family History  Problem Relation Age of Onset  . Diabetes Father   . Hypertension Father   . Heart disease Father   . Sudden death Father   . Hyperlipidemia Mother   . Hypertension Mother   . Obesity Mother   . Cancer Maternal Grandmother        lung  . Cancer Maternal Grandfather        prostate    ROS: Review  of Systems  Gastrointestinal: Negative for nausea.  Endo/Heme/Allergies:       Negative for hypoglycemia  Psychiatric/Behavioral: Positive for depression. Negative for suicidal ideas.    PHYSICAL EXAM: Pt in no acute distress  RECENT LABS AND TESTS: BMET    Component Value Date/Time   NA 142 10/19/2018 0839   K 4.8 10/19/2018 0839   CL 104 10/19/2018 0839   CO2 20 10/19/2018 0839   GLUCOSE 127 (H) 10/19/2018 0839   GLUCOSE 150 (H) 08/08/2017 0348   BUN 14 10/19/2018 0839   CREATININE 0.88 10/19/2018 0839   CALCIUM 9.1 10/19/2018 0839   GFRNONAA 84 10/19/2018 0839   GFRAA 96 10/19/2018 0839   Lab Results  Component Value Date   HGBA1C 6.2 (H) 10/19/2018   HGBA1C 6.2 (H) 06/12/2018   HGBA1C 7.1 (H) 08/01/2017   HGBA1C 7.4 (H) 07/25/2017   HGBA1C 7.4 (H) 07/24/2017   Lab Results  Component Value Date   INSULIN 12.3 10/19/2018   INSULIN 16.3 06/12/2018   CBC    Component Value Date/Time   WBC 6.8 09/14/2017 0949   WBC 9.6 08/08/2017 0348   RBC 5.08 09/14/2017 0949   RBC 2.89 (L) 08/08/2017 0348   HGB 13.3 09/14/2017 0949   HCT 39.9 09/14/2017 0949   PLT 320 09/14/2017 0949   MCV 79 09/14/2017 0949   MCH 26.2 (L) 09/14/2017 0949   MCH 27.3 08/08/2017 0348   MCHC 33.3 09/14/2017 0949   MCHC 33.3 08/08/2017 0348   RDW 13.8 09/14/2017 0949   LYMPHSABS 1.8 07/24/2017 0518   MONOABS 0.7 07/24/2017 0518   EOSABS 0.1 07/24/2017 0518   BASOSABS 0.0 07/24/2017 0518   Iron/TIBC/Ferritin/ %Sat No results found for: IRON, TIBC, FERRITIN, IRONPCTSAT Lipid Panel     Component Value Date/Time   CHOL 112 06/12/2018 0808   TRIG 66 06/12/2018 0808   HDL 37 (L) 06/12/2018 0808   CHOLHDL 2.7 03/16/2018 0936   CHOLHDL 3.6 07/26/2017 0250   VLDL 25 07/26/2017 0250   LDLCALC 62 06/12/2018 0808   Hepatic Function Panel     Component Value Date/Time   PROT 6.4 10/19/2018 0839   ALBUMIN 3.8 10/19/2018 0839   AST 16 10/19/2018 0839   ALT 13 10/19/2018 0839   ALKPHOS 77  10/19/2018 0839   BILITOT 0.7 10/19/2018 0839  BILIDIR 0.14 03/16/2018 0936      Component Value Date/Time   TSH 1.560 06/12/2018 0808     Ref. Range 10/19/2018 08:39  Vitamin D, 25-Hydroxy Latest Ref Range: 30.0 - 100.0 ng/mL 57.8    I, Doreene Nest, am acting as Location manager for [employer]. Owens Shark, DO  I have reviewed the above documentation for accuracy and completeness, and I agree with the above. -Jearld Lesch, DO

## 2019-03-20 ENCOUNTER — Telehealth (INDEPENDENT_AMBULATORY_CARE_PROVIDER_SITE_OTHER): Payer: BC Managed Care – PPO | Admitting: Bariatrics

## 2019-03-20 NOTE — Progress Notes (Signed)
Office: [phone]  /  Fax: [phone]    Date: March 27, 2019   Appointment Start Time: 10:34am Duration: 20 minutes Provider: Glennie Isle, Psy.D. Type of Session: Individual Therapy  Location of Patient: Home Location of Provider: [facility] Type of Contact: Telepsychological Visit via [employer] WebEx   Session Content: [name] is a 38 y.o. female presenting via [place] for a follow-up appointment to address the previously established treatment goal of decreasing emotional eating. Of note, this provider called [name] at 10:33am, as she did not present for today's appointment. She noted, "Trying to join the meeting on my phone." As such, today's appointment was initiated 4 minutes late. Today's appointment was a telepsychological visit, as this provider's clinic is seeing a limited number of patients for in-person visits due to COVID-19. Therapeutic services will resume to in-person appointments once deemed appropriate. [name] expressed understanding regarding the rationale for telepsychological services, and provided verbal consent for today's appointment. Prior to proceeding with today's appointment, [name]'s physical location at the time of this appointment was obtained. [name] reported she was at home and provided the address. In the event of technical difficulties, [name] shared a phone number she could be reached at. [name] and this provider participated in today's telepsychological service. Also, [name] denied anyone else being present in the room or on the WebEx appointment.  This provider conducted a brief check-in and verbally administered the PHQ-9 and GAD-7. [name] discussed ongoing dental procedures since the last appointment with this provider. Regarding mindfulness, [name] discussed finding videos that she enjoys and has been using them regularly. This was positively reinforced. The impact of mindfulness was explored. She indicated, "It just makes  me stop and take a moment before I react to something or before I let something totally take me over." She noted it has also helped reduce emotional eating and increase her water intake. She also discussed improvements in her eating habits when going to to her mother's house, as she engages in meal prep. [name] of session focused further on mindfulness. She was led through a mindfulness exercise focusing on her breath. Following the exercise, [name] reported, "It felt like a nice getaway for a bit." This provider discussed various examples in which she can use her breath as an anchor even if she is unable to engage in the full mindfulness exercise. [name] provided verbal consent during today's appointment for this provider to send exercise handout via e-mail. Session focused further on termination planning. [name] noted, "I think I'm ready to terminate. I think I got some good tools." Overall, [name] was receptive to today's session as evidenced by openness to sharing, responsiveness to feedback, and willingness to engage in learned skills.  Mental Status Examination:  Appearance: neat Behavior: cooperative Mood: euthymic Affect: mood congruent Speech: normal in rate, volume, and tone Eye Contact: appropriate Psychomotor Activity: appropriate Thought Process: linear, logical, and goal directed  Content/Perceptual Disturbances: denies suicidal and homicidal ideation, plan, and intent and no hallucinations, delusions, bizarre thinking or behavior reported or observed Orientation: time, person, place and purpose of appointment Cognition/Sensorium: memory, attention, language, and fund of knowledge intact  Insight: good Judgment: good  Structured Assessment Results: The Patient Health Questionnaire-9 (PHQ-9) is a self-report measure that assesses symptoms and severity of depression over the course of the last two weeks. [name] obtained a score of 0. Little interest or pleasure in doing things 0   Feeling down, depressed, or hopeless 0  Trouble falling or staying asleep, or sleeping  too much 0  Feeling tired or having little energy 0  Poor appetite or overeating 0  Feeling bad about yourself --- or that you are a failure or have let yourself or your family down 0  Trouble concentrating on things, such as reading the newspaper or watching television 0  Moving or speaking so slowly that other people could have noticed? Or the opposite --- being so fidgety or restless that you have been moving around a lot more than usual 0  Thoughts that you would be better off dead or hurting yourself in some way 0  PHQ-9 Score 0    The Generalized Anxiety Disorder-7 (GAD-7) is a brief self-report measure that assesses symptoms of anxiety over the course of the last two weeks. [name] obtained a score of 0. Feeling nervous, anxious, on edge 0  Not being able to stop or control worrying 0  Worrying too much about different things 0  Trouble relaxing 0  Being so restless that it's hard to sit still 0  Becoming easily annoyed or irritable 0  Feeling afraid as if something awful might happen 0  GAD-7 Score 0   Interventions:  Conducted a brief chart review Verbal administration of PHQ-9 and GAD-7 for symptom monitoring Provided empathic reflections and validation Reviewed content from the previous session Engaged patient in a mindfulness exercise Employed supportive psychotherapy interventions to facilitate reduced distress, and to improve coping skills with identified stressors Employed acceptance and commitment interventions to emphasize mindfulness and acceptance without struggle  DSM-5 Diagnosis: 311 (F32.8) Other Specified Depressive Disorder, Emotional Eating  Treatment Goal & Progress: During the initial appointment with this provider, the following treatment goal was established: decrease emotional eating. [name] has demonstrated progress in her goal as evidenced by increased awareness of  hunger patterns and triggers for emotional eating. Since the onset of treatment, there has been a reduction in emotional eating, and she continues to engage in learned skills to increase coping.   Plan: Per Lakysha's request, today was the last appointment with this provider.

## 2019-03-21 ENCOUNTER — Encounter (INDEPENDENT_AMBULATORY_CARE_PROVIDER_SITE_OTHER): Payer: Self-pay | Admitting: Bariatrics

## 2019-03-21 ENCOUNTER — Ambulatory Visit (INDEPENDENT_AMBULATORY_CARE_PROVIDER_SITE_OTHER): Payer: BC Managed Care – PPO | Admitting: Bariatrics

## 2019-03-21 ENCOUNTER — Other Ambulatory Visit: Payer: Self-pay

## 2019-03-21 VITALS — BP 136/82 | HR 98 | Temp 98.5°F | Ht 63.0 in | Wt 251.0 lb

## 2019-03-21 DIAGNOSIS — R7303 Prediabetes: Secondary | ICD-10-CM | POA: Diagnosis not present

## 2019-03-21 DIAGNOSIS — Z6841 Body Mass Index (BMI) 40.0 and over, adult: Secondary | ICD-10-CM

## 2019-03-21 DIAGNOSIS — F3289 Other specified depressive episodes: Secondary | ICD-10-CM

## 2019-03-21 DIAGNOSIS — Z9189 Other specified personal risk factors, not elsewhere classified: Secondary | ICD-10-CM | POA: Diagnosis not present

## 2019-03-21 DIAGNOSIS — E559 Vitamin D deficiency, unspecified: Secondary | ICD-10-CM | POA: Diagnosis not present

## 2019-03-22 LAB — COMPREHENSIVE METABOLIC PANEL
ALT: 13 IU/L (ref 0–32)
AST: 16 IU/L (ref 0–40)
Albumin/Globulin Ratio: 1.7 (ref 1.2–2.2)
Albumin: 4 g/dL (ref 3.8–4.8)
Alkaline Phosphatase: 80 IU/L (ref 39–117)
BUN/Creatinine Ratio: 12 (ref 9–23)
BUN: 10 mg/dL (ref 6–20)
Bilirubin Total: 0.4 mg/dL (ref 0.0–1.2)
CO2: 17 mmol/L — ABNORMAL LOW (ref 20–29)
Calcium: 8.7 mg/dL (ref 8.7–10.2)
Chloride: 103 mmol/L (ref 96–106)
Creatinine, Ser: 0.84 mg/dL (ref 0.57–1.00)
GFR calc Af Amer: 102 mL/min/{1.73_m2} (ref >59)
GFR calc non Af Amer: 88 mL/min/{1.73_m2} (ref >59)
Globulin, Total: 2.4 g/dL (ref 1.5–4.5)
Glucose: 87 mg/dL (ref 65–99)
Potassium: 4.4 mmol/L (ref 3.5–5.2)
Sodium: 139 mmol/L (ref 134–144)
Total Protein: 6.4 g/dL (ref 6.0–8.5)

## 2019-03-22 LAB — LIPID PANEL WITH LDL/HDL RATIO
Cholesterol, Total: 111 mg/dL (ref 100–199)
HDL: 52 mg/dL (ref >39)
LDL Calculated: 45 mg/dL (ref 0–99)
LDl/HDL Ratio: 0.9 ratio (ref 0.0–3.2)
Triglycerides: 70 mg/dL (ref 0–149)
VLDL Cholesterol Cal: 14 mg/dL (ref 5–40)

## 2019-03-22 LAB — VITAMIN D 25 HYDROXY (VIT D DEFICIENCY, FRACTURES): Vit D, 25-Hydroxy: 47.1 ng/mL (ref 30.0–100.0)

## 2019-03-22 NOTE — Progress Notes (Signed)
Office: [phone]  /  Fax: [phone]   HPI:   Chief Complaint: OBESITY [name] is here to discuss her progress with her obesity treatment plan. She is on the Category 2 plan and is following her eating plan approximately 65 % of the time. She states she is walking 30 minutes 5 times per week. [name] is doing well and she has lost 2 pounds. [name] followed the plan more closely. Her weight is 251 lb (113.9 kg) today and has had a weight loss of 2 pounds since her last in-office visit. She has lost 9 lbs since starting treatment with [name].  Pre-Diabetes [name] has a diagnosis of prediabetes based on her elevated Hgb A1c and was informed this puts her at greater risk of developing diabetes. Her last A1c was 6.1 and last insulin level was at 12.3 (another office). [name] is taking metformin currently and she continues to work on diet and exercise to decrease risk of diabetes. She denies nausea or hypoglycemia.  At risk for diabetes [name] is at higher than average risk for developing diabetes due to her obesity and prediabetes. She currently denies polyuria or polydipsia.  Depression with emotional eating behaviors [name] is struggling with emotional eating and using food for comfort to the extent that it is negatively impacting her health. She often snacks when she is not hungry. [name] sometimes feels she is out of control and then feels guilty that she made poor food choices. [name] is seeing [name] our bariatric psychologist. She has been working on behavior modification techniques to help reduce her emotional eating and has been somewhat successful. She shows no sign of suicidal or homicidal ideations.  Vitamin D deficiency [name] has a diagnosis of vitamin D deficiency. She is not currently taking vit D and denies nausea, vomiting or muscle weakness.  ASSESSMENT AND PLAN:  Prediabetes - Plan: Lipid Panel With LDL/HDL Ratio, Comprehensive metabolic panel  Vitamin D  deficiency - Plan: VITAMIN D 25 Hydroxy (Vit-D Deficiency, Fractures)  Other depression  At risk for diabetes mellitus  Class 3 severe obesity with serious comorbidity and body mass index (BMI) of 40.0 to 44.9 in adult, unspecified obesity type ([place])  PLAN:  Pre-Diabetes [name] will continue to work on weight loss, exercise, and decreasing simple carbohydrates in her diet to help decrease the risk of diabetes. We dicussed metformin including benefits and risks. She was informed that eating too many simple carbohydrates or too many calories at one sitting increases the likelihood of GI side effects. [name] will continue metformin and follow up with [name] as directed to monitor her progress.  Diabetes risk counseling [name] was given extended (15 minutes) diabetes prevention counseling today. She is 38 y.o. female and has risk factors for diabetes including obesity and prediabetes. We discussed intensive lifestyle modifications today with an emphasis on weight loss as well as increasing exercise and decreasing simple carbohydrates in her diet.  Depression with Emotional Eating Behaviors We discussed behavior modification techniques today to help [name] deal with her emotional eating and depression. [name] will continue to see [name].  Vitamin D Deficiency [name] was informed that low vitamin D levels contributes to fatigue and are associated with obesity, breast, and colon cancer. We will check vitamin D level and she will follow up for routine testing of vitamin D, at least 2-3 times per year.   Obesity [name] is currently in the action stage of change. As such, her goal is to continue with weight loss efforts She has  agreed to follow the Category 2 plan [name] will continue to walk for weight loss and overall health benefits. We discussed the following Behavioral Modification Strategies today: increase H2O intake, no skipping meals, keeping healthy foods in the home, increasing  lean protein intake, decreasing simple carbohydrates, increasing vegetables, decrease eating out and work on meal planning and easy cooking plans  Mirha has agreed to follow up with our clinic in 2 weeks. She was informed of the importance of frequent follow up visits to maximize her success with intensive lifestyle modifications for her multiple health conditions.  ALLERGIES: No Known Allergies  MEDICATIONS: Current Outpatient Medications on File Prior to Visit  Medication Sig Dispense Refill  . acetaminophen (TYLENOL) 500 MG tablet Take 500 mg by mouth every 6 (six) hours as needed for mild pain or headache.    Marland Kitchen aspirin EC 81 MG EC tablet Take 1 tablet (81 mg total) daily by mouth.    . clopidogrel (PLAVIX) 75 MG tablet Take 1 tablet (75 mg total) by mouth daily. 90 tablet 2  . Coenzyme Q10 (COQ10) 100 MG CAPS Take 100 mg by mouth daily.    . colchicine 0.6 MG tablet Take two (2) tablets by mouth at time of initial pain. Then take one (1) tablet by mouth twice daily as needed for gout.    . Dulaglutide (TRULICITY) 1.5 QV/9.5GL SOPN Inject 1 Dose into the skin once a week.    . metFORMIN (GLUCOPHAGE) 500 MG tablet Take 500 mg by mouth 2 (two) times daily with a meal.    . metoprolol tartrate (LOPRESSOR) 25 MG tablet TAKE 1 TABLET(25 MG) BY MOUTH TWICE DAILY 180 tablet 2  . Misc Natural Products (DAILY HERBS BONE/JOINTS PO) Take 3-5 tablets daily by mouth. Insta flex    . olmesartan (BENICAR) 5 MG tablet TAKE 1 TABLET(5 MG) BY MOUTH DAILY 90 tablet 2  . Vitamin D, Ergocalciferol, [name] Take 1 capsule (50,000 Units total) by mouth every 14 (fourteen) days. 2 capsule 0  . atorvastatin (LIPITOR) 40 MG tablet Take 1 tablet (40 mg total) by mouth daily. 90 tablet 3   No current facility-administered medications on file prior to visit.     PAST MEDICAL HISTORY: Past Medical History:  Diagnosis Date  . Anxiety   . Back pain   . Chest pain   . Coronary  artery disease   . Diabetes mellitus   . Difficulty swallowing pills   . Fatigue   . GERD (gastroesophageal reflux disease)   . Hay fever   . Headache   . Heartburn   . History of heart attack   . HTN (hypertension)   . Hyperlipidemia   . Joint pain   . Knee pain   . Nervousness   . Palpitations   . Shortness of breath   . Shortness of breath on exertion   . Stress     PAST SURGICAL HISTORY: Past Surgical History:  Procedure Laterality Date  . CORONARY ARTERY BYPASS GRAFT N/A 08/02/2017   Procedure: CORONARY ARTERY BYPASS GRAFTING (CABG) X 5 (LIMA to LAD, LEFT RADIAL ARTERY to DIAGONAL, SVG to SEQUENTIALLY PDA and PLB) , USING LEFT INTERNAL MAMMARY ARTERY, LEFT RADIAL ARTERY, AND  SAPHENOUS VEIN to [place], RIGHT GREATER SAPHENOUS VEIN HARVESTED ENDOSCOPICALLY;  Surgeon: Grace Isaac, MD;  Location: [facility];  Service: Open Heart Surgery;  Laterality: N/A;  . CORONARY/GRAFT ACUTE MI REVASCULARIZATION N/A 07/24/2017   Procedure: Coronary/Graft Acute MI Revascularization;  Surgeon: Belva Crome, MD;  Location: [facility];  Service: Cardiovascular;  Laterality: N/A;  . LAPAROSCOPIC GASTRIC BANDING  09/02/2008  . LEFT HEART CATH AND CORONARY ANGIOGRAPHY N/A 07/24/2017   Procedure: LEFT HEART CATH AND CORONARY ANGIOGRAPHY;  Surgeon: Belva Crome, MD;  Location: [facility];  Service: Cardiovascular;  Laterality: N/A;  . RADIAL ARTERY HARVEST Left 08/02/2017   Procedure: LEFT RADIAL ARTERY HARVEST;  Surgeon: Grace Isaac, MD;  Location: [facility];  Service: Open Heart Surgery;  Laterality: Left;  . STERNAL CLOSURE N/A 08/02/2017   Procedure: STERNAL PLATING;  Surgeon: Grace Isaac, MD;  Location: [facility];  Service: Open Heart Surgery;  Laterality: N/A;  . [place]   had metal removed from stomach as a child  . TEE WITHOUT CARDIOVERSION N/A 08/02/2017   Procedure: TRANSESOPHAGEAL ECHOCARDIOGRAM (TEE);  Surgeon: Grace Isaac, MD;  Location:  [facility];  Service: Open Heart Surgery;  Laterality: N/A;    SOCIAL HISTORY: Social History   Tobacco Use  . Smoking status: Never Smoker  . Smokeless tobacco: Never Used  Substance Use Topics  . Alcohol use: Yes    Comment: occ  . Drug use: No    FAMILY HISTORY: Family History  Problem Relation Age of Onset  . Diabetes Father   . Hypertension Father   . Heart disease Father   . Sudden death Father   . Hyperlipidemia Mother   . Hypertension Mother   . Obesity Mother   . Cancer Maternal Grandmother        lung  . Cancer Maternal Grandfather        prostate    ROS: Review of Systems  Constitutional: Positive for weight loss.  Gastrointestinal: Negative for nausea and vomiting.  Genitourinary: Negative for frequency.  Musculoskeletal:       Negative for muscle weakness  Endo/Heme/Allergies: Negative for polydipsia.       Negative for hypoglycemia  Psychiatric/Behavioral: Positive for depression. Negative for suicidal ideas.    PHYSICAL EXAM: Blood pressure 136/82, pulse 98, temperature 98.5 F (36.9 C), temperature source Oral, height 5\' 3"  (1.6 m), weight 251 lb (113.9 kg), SpO2 99 %. Body mass index is 44.46 kg/m. Physical Exam Vitals signs reviewed.  Constitutional:      Appearance: Normal appearance. She is well-developed. She is obese.  Cardiovascular:     Rate and Rhythm: Normal rate.  Pulmonary:     Effort: Pulmonary effort is normal.  Musculoskeletal: Normal range of motion.  Skin:    General: Skin is warm and dry.  Neurological:     Mental Status: She is alert and oriented to person, place, and time.  Psychiatric:        Mood and Affect: Mood normal.        Behavior: Behavior normal.        Thought Content: Thought content does not include homicidal or suicidal ideation.     RECENT LABS AND TESTS: BMET    Component Value Date/Time   NA 139 03/21/2019 1529   K 4.4 03/21/2019 1529   CL 103 03/21/2019 1529   CO2 17 (L) 03/21/2019 1529    GLUCOSE 87 03/21/2019 1529   GLUCOSE 150 (H) 08/08/2017 0348   BUN 10 03/21/2019 1529   CREATININE 0.84 03/21/2019 1529   CALCIUM 8.7 03/21/2019 1529   GFRNONAA 88 03/21/2019 1529   GFRAA 102 03/21/2019 1529   Lab Results  Component Value Date   HGBA1C  6.2 (H) 10/19/2018   HGBA1C 6.2 (H) 06/12/2018   HGBA1C 7.1 (H) 08/01/2017   HGBA1C 7.4 (H) 07/25/2017   HGBA1C 7.4 (H) 07/24/2017   Lab Results  Component Value Date   INSULIN 12.3 10/19/2018   INSULIN 16.3 06/12/2018   CBC    Component Value Date/Time   WBC 6.8 09/14/2017 0949   WBC 9.6 08/08/2017 0348   RBC 5.08 09/14/2017 0949   RBC 2.89 (L) 08/08/2017 0348   HGB 13.3 09/14/2017 0949   HCT 39.9 09/14/2017 0949   PLT 320 09/14/2017 0949   MCV 79 09/14/2017 0949   MCH 26.2 (L) 09/14/2017 0949   MCH 27.3 08/08/2017 0348   MCHC 33.3 09/14/2017 0949   MCHC 33.3 08/08/2017 0348   RDW 13.8 09/14/2017 0949   LYMPHSABS 1.8 07/24/2017 0518   MONOABS 0.7 07/24/2017 0518   EOSABS 0.1 07/24/2017 0518   BASOSABS 0.0 07/24/2017 0518   Iron/TIBC/Ferritin/ %Sat No results found for: IRON, TIBC, FERRITIN, IRONPCTSAT Lipid Panel     Component Value Date/Time   CHOL 111 03/21/2019 1529   TRIG 70 03/21/2019 1529   HDL 52 03/21/2019 1529   CHOLHDL 2.7 03/16/2018 0936   CHOLHDL 3.6 07/26/2017 0250   VLDL 25 07/26/2017 0250   LDLCALC 45 03/21/2019 1529   Hepatic Function Panel     Component Value Date/Time   PROT 6.4 03/21/2019 1529   ALBUMIN 4.0 03/21/2019 1529   AST 16 03/21/2019 1529   ALT 13 03/21/2019 1529   ALKPHOS 80 03/21/2019 1529   BILITOT 0.4 03/21/2019 1529   BILIDIR 0.14 03/16/2018 0936      Component Value Date/Time   TSH 1.560 06/12/2018 0808     Ref. Range 10/19/2018 08:39  Vitamin D, 25-Hydroxy Latest Ref Range: 30.0 - 100.0 ng/mL 57.8    OBESITY BEHAVIORAL INTERVENTION VISIT  Today's visit was # 16   Starting weight: 260 lbs Starting date: 06/08/2018 Today's weight : 251 lbs Today's date:  03/21/2019 Total lbs lost to date: 9    03/21/2019  Height 5\' 3"  (1.6 m)  Weight 251 lb (113.9 kg)  BMI (Calculated) 44.47  BLOOD PRESSURE - SYSTOLIC 453  BLOOD PRESSURE - DIASTOLIC 82   Body Fat % 52 %  Total Body Water (lbs) 87.4 lbs    ASK: We discussed the diagnosis of obesity with Mariya D Nichols today and Chloee agreed to give [name] permission to discuss obesity behavioral modification therapy today.  ASSESS: Shakeria has the diagnosis of obesity and her BMI today is 44.47 Andelyn is in the action stage of change   ADVISE: Korrie was educated on the multiple health risks of obesity as well as the benefit of weight loss to improve her health. She was advised of the need for long term treatment and the importance of lifestyle modifications to improve her current health and to decrease her risk of future health problems.  AGREE: Multiple dietary modification options and treatment options were discussed and  Daine agreed to follow the recommendations documented in the above note.  ARRANGE: Cassadi was educated on the importance of frequent visits to treat obesity as outlined per CMS and USPSTF guidelines and agreed to schedule her next follow up appointment today.  Corey Skains, am acting as Location manager for [employer]. Owens Shark, DO  I have reviewed the above documentation for accuracy and completeness, and I agree with the above. -Jearld Lesch, DO

## 2019-03-27 ENCOUNTER — Ambulatory Visit (INDEPENDENT_AMBULATORY_CARE_PROVIDER_SITE_OTHER): Payer: BC Managed Care – PPO | Admitting: Psychology

## 2019-03-27 ENCOUNTER — Other Ambulatory Visit: Payer: Self-pay

## 2019-03-27 DIAGNOSIS — F3289 Other specified depressive episodes: Secondary | ICD-10-CM | POA: Diagnosis not present

## 2019-03-31 ENCOUNTER — Other Ambulatory Visit: Payer: Self-pay | Admitting: Interventional Cardiology

## 2019-03-31 ENCOUNTER — Other Ambulatory Visit (INDEPENDENT_AMBULATORY_CARE_PROVIDER_SITE_OTHER): Payer: Self-pay | Admitting: Bariatrics

## 2019-03-31 DIAGNOSIS — E559 Vitamin D deficiency, unspecified: Secondary | ICD-10-CM

## 2019-04-03 ENCOUNTER — Other Ambulatory Visit: Payer: Self-pay | Admitting: *Deleted

## 2019-04-03 MED ORDER — ATORVASTATIN CALCIUM 40 MG PO TABS: 40.0000 mg | ORAL_TABLET | Freq: Every day | ORAL | 3 refills | Status: DC

## 2019-04-03 NOTE — Telephone Encounter (Signed)
Outpatient Medication Detail   Disp Refills Start End   atorvastatin (LIPITOR) 40 MG tablet 90 tablet 3 04/03/2019 03/28/2020   Sig - Route: Take 1 tablet (40 mg total) by mouth daily. - Oral   Sent to pharmacy as: atorvastatin (LIPITOR) 40 MG tablet   Notes to Pharmacy: Dose change   E-Prescribing Status: Receipt confirmed by pharmacy (04/03/2019 2:31 PM EDT)   [place], [place]

## 2019-04-05 ENCOUNTER — Ambulatory Visit (INDEPENDENT_AMBULATORY_CARE_PROVIDER_SITE_OTHER): Payer: BC Managed Care – PPO | Admitting: Bariatrics

## 2019-04-12 ENCOUNTER — Other Ambulatory Visit: Payer: Self-pay

## 2019-04-12 ENCOUNTER — Ambulatory Visit (INDEPENDENT_AMBULATORY_CARE_PROVIDER_SITE_OTHER): Payer: BC Managed Care – PPO | Admitting: Physician Assistant

## 2019-04-12 VITALS — BP 121/81 | HR 85 | Temp 98.4°F | Ht 63.0 in | Wt 249.0 lb

## 2019-04-12 DIAGNOSIS — E559 Vitamin D deficiency, unspecified: Secondary | ICD-10-CM

## 2019-04-12 DIAGNOSIS — E119 Type 2 diabetes mellitus without complications: Secondary | ICD-10-CM

## 2019-04-12 DIAGNOSIS — Z6841 Body Mass Index (BMI) 40.0 and over, adult: Secondary | ICD-10-CM

## 2019-04-12 DIAGNOSIS — Z9189 Other specified personal risk factors, not elsewhere classified: Secondary | ICD-10-CM | POA: Diagnosis not present

## 2019-04-12 MED ORDER — VITAMIN D (ERGOCALCIFEROL) 1.25 MG (50000 UNIT) PO CAPS: 50000.0000 [IU] | ORAL_CAPSULE | ORAL | 0 refills | Status: DC

## 2019-04-12 NOTE — Progress Notes (Signed)
Office: [phone]  /  Fax: [phone]   HPI:   Chief Complaint: OBESITY [name] is here to discuss her progress with her obesity treatment plan. She is on the Category 2 plan and is following her eating plan approximately 70% of the time. She states she is walking 30 minutes 3-4 times per week. [name] reports that she has trouble getting all of the protein in on the plan at dinner. She is looking for ways to try to keep her meals interesting.  Her weight is 249 lb (112.9 kg) today and has had a weight loss of 2 pounds over a period of 3 weeks since her last visit. She has lost 11 lbs since starting treatment with [name].  Vitamin D deficiency [name] has a diagnosis of Vitamin D deficiency. She is currently taking prescription Vit D and denies nausea, vomiting or muscle weakness.  At risk for osteopenia and osteoporosis [name] is at higher risk of osteopenia and osteoporosis due to Vitamin D deficiency.   Diabetes II [name] has a diagnosis of diabetes type II and is on Xigduo. [name] states she checks her blood sugars intermittently at home and they range between 88 and 96. Last A1c was 6.2 on 10/19/2018. She has been working on intensive lifestyle modifications including diet, exercise, and weight loss to help control her blood glucose levels. She sees her endocrinologist every 4-6 months. No nausea, vomiting, diarrhea, yeast infections, or UTI's.  ASSESSMENT AND PLAN:  Vitamin D deficiency - Plan: Vitamin D, Ergocalciferol, [name]  Type 2 diabetes mellitus without complication, without long-term current use of insulin (HCC)  At risk for osteoporosis  Class 3 severe obesity with serious comorbidity and body mass index (BMI) of 40.0 to 44.9 in adult, unspecified obesity type ([place])  PLAN:  Vitamin D Deficiency [name] was informed that low Vitamin D levels contributes to fatigue and are associated with obesity, breast, and colon cancer. She  agrees to continue to take prescription Vit D @ 50,000 IU every 14 days #2 with 0 refills and will follow-up for routine testing of Vitamin D, at least 2-3 times per year. She was informed of the risk of over-replacement of Vitamin D and agrees to not increase her dose unless she discusses this with [name] first. [name] agrees to follow-up with our clinic in 2 weeks.  At risk for osteopenia and osteoporosis [name] was given extended  (15 minutes) osteoporosis prevention counseling today. [name] is at risk for osteopenia and osteoporsis due to her Vitamin D deficiency. She was encouraged to take her Vitamin D and follow her higher calcium diet and increase strengthening exercise to help strengthen her bones and decrease her risk of osteopenia and osteoporosis.  Diabetes II [name] has been given extensive diabetes education by myself today including ideal fasting and post-prandial blood glucose readings, individual ideal Hgb A1c goals  and hypoglycemia prevention. We discussed the importance of good blood sugar control to decrease the likelihood of diabetic complications such as nephropathy, neuropathy, limb loss, blindness, coronary artery disease, and death. We discussed the importance of intensive lifestyle modification including diet, exercise and weight loss as the first line treatment for diabetes. [name] agrees to continue her diabetes medications and will follow-up at the agreed upon time.  Obesity [name] is currently in the action stage of change. As such, her goal is to continue with weight loss efforts. She has agreed to follow the Category 2 plan. [name] has been instructed to work up to  a goal of 150 minutes of combined cardio and strengthening exercise per week for weight loss and overall health benefits. We discussed the following Behavioral Modification Strategies today: increasing lean protein intake, work on meal planning and easy cooking plans.  [name] has agreed to follow-up  with our clinic in 2 weeks. She was informed of the importance of frequent follow-up visits to maximize her success with intensive lifestyle modifications for her multiple health conditions.  ALLERGIES: No Known Allergies  MEDICATIONS: Current Outpatient Medications on File Prior to Visit  Medication Sig Dispense Refill   Dapagliflozin-metFORMIN HCl ER (XIGDUO XR) 06-999 MG TB24 Take 10-1,000 capsules by mouth.     acetaminophen (TYLENOL) 500 MG tablet Take 500 mg by mouth every 6 (six) hours as needed for mild pain or headache.     aspirin EC 81 MG EC tablet Take 1 tablet (81 mg total) daily by mouth.     atorvastatin (LIPITOR) 40 MG tablet Take 1 tablet (40 mg total) by mouth daily. 90 tablet 3   clopidogrel (PLAVIX) 75 MG tablet Take 1 tablet (75 mg total) by mouth daily. 90 tablet 2   Coenzyme Q10 (COQ10) 100 MG CAPS Take 100 mg by mouth daily.     colchicine 0.6 MG tablet Take two (2) tablets by mouth at time of initial pain. Then take one (1) tablet by mouth twice daily as needed for gout.     Dulaglutide (TRULICITY) 1.5 [place]/7.0WC SOPN Inject 1 Dose into the skin once a week.     metoprolol tartrate (LOPRESSOR) 25 MG tablet TAKE 1 TABLET(25 MG) BY MOUTH TWICE DAILY 180 tablet 2   Misc Natural Products (DAILY HERBS BONE/JOINTS PO) Take 3-5 tablets daily by mouth. Insta flex     olmesartan (BENICAR) 5 MG tablet TAKE 1 TABLET(5 MG) BY MOUTH DAILY 90 tablet 2   No current facility-administered medications on file prior to visit.     PAST MEDICAL HISTORY: Past Medical History:  Diagnosis Date   Anxiety    Back pain    Chest pain    Coronary artery disease    Diabetes mellitus    Difficulty swallowing pills    Fatigue    GERD (gastroesophageal reflux disease)    Hay fever    Headache    Heartburn    History of heart attack    HTN (hypertension)    Hyperlipidemia    Joint pain    Knee pain    Nervousness    Palpitations    Shortness of breath      Shortness of breath on exertion    Stress     PAST SURGICAL HISTORY: Past Surgical History:  Procedure Laterality Date   CORONARY ARTERY BYPASS GRAFT N/A 08/02/2017   Procedure: CORONARY ARTERY BYPASS GRAFTING (CABG) X 5 (LIMA to LAD, LEFT RADIAL ARTERY to DIAGONAL, SVG to SEQUENTIALLY PDA and PLB) , USING LEFT INTERNAL MAMMARY ARTERY, LEFT RADIAL ARTERY, AND  SAPHENOUS VEIN to [place], RIGHT GREATER SAPHENOUS VEIN HARVESTED ENDOSCOPICALLY;  Surgeon: Grace Isaac, MD;  Location: [facility];  Service: Open Heart Surgery;  Laterality: N/A;   CORONARY/GRAFT ACUTE MI REVASCULARIZATION N/A 07/24/2017   Procedure: Coronary/Graft Acute MI Revascularization;  Surgeon: Belva Crome, MD;  Location: [facility];  Service: Cardiovascular;  Laterality: N/A;   LAPAROSCOPIC GASTRIC BANDING  09/02/2008   LEFT HEART CATH AND CORONARY ANGIOGRAPHY N/A 07/24/2017   Procedure: LEFT HEART CATH AND CORONARY ANGIOGRAPHY;  Surgeon: Belva Crome, MD;  Location:  [facility];  Service: Cardiovascular;  Laterality: N/A;   RADIAL ARTERY HARVEST Left 08/02/2017   Procedure: LEFT RADIAL ARTERY HARVEST;  Surgeon: Grace Isaac, MD;  Location: [place];  Service: Open Heart Surgery;  Laterality: Left;   STERNAL CLOSURE N/A 08/02/2017   Procedure: STERNAL PLATING;  Surgeon: Grace Isaac, MD;  Location: [facility];  Service: Open Heart Surgery;  Laterality: N/A;   STOMACH SURGERY  1982   had metal removed from stomach as a child   TEE WITHOUT CARDIOVERSION N/A 08/02/2017   Procedure: TRANSESOPHAGEAL ECHOCARDIOGRAM (TEE);  Surgeon: Grace Isaac, MD;  Location: [place];  Service: Open Heart Surgery;  Laterality: N/A;    SOCIAL HISTORY: Social History   Tobacco Use   Smoking status: Never Smoker   Smokeless tobacco: Never Used  Substance Use Topics   Alcohol use: Yes    Comment: occ   Drug use: No    FAMILY HISTORY: Family History  Problem Relation Age of Onset   Diabetes  Father    Hypertension Father    Heart disease Father    Sudden death Father    Hyperlipidemia Mother    Hypertension Mother    Obesity Mother    Cancer Maternal Grandmother        lung   Cancer Maternal Grandfather        prostate   ROS: Review of Systems  Gastrointestinal: Negative for diarrhea, nausea and vomiting.  Genitourinary:       Negative for yeast infections. Negative for UTI's.  Musculoskeletal:       Negative for muscle weakness.   PHYSICAL EXAM: Blood pressure 121/81, pulse 85, temperature 98.4 F (36.9 C), temperature source Oral, height 5\' 3"  (1.6 m), weight 249 lb (112.9 kg), SpO2 99 %. Body mass index is 44.11 kg/m. Physical Exam Vitals signs reviewed.  Constitutional:      Appearance: Normal appearance. She is obese.  Cardiovascular:     Rate and Rhythm: Normal rate.     Pulses: Normal pulses.  Pulmonary:     Effort: Pulmonary effort is normal.     Breath sounds: Normal breath sounds.  Musculoskeletal: Normal range of motion.  Skin:    General: Skin is warm and dry.  Neurological:     Mental Status: She is alert and oriented to person, place, and time.  Psychiatric:        Behavior: Behavior normal.   RECENT LABS AND TESTS: BMET    Component Value Date/Time   NA 139 03/21/2019 1529   K 4.4 03/21/2019 1529   CL 103 03/21/2019 1529   CO2 17 (L) 03/21/2019 1529   GLUCOSE 87 03/21/2019 1529   GLUCOSE 150 (H) 08/08/2017 0348   BUN 10 03/21/2019 1529   CREATININE 0.84 03/21/2019 1529   CALCIUM 8.7 03/21/2019 1529   GFRNONAA 88 03/21/2019 1529   GFRAA 102 03/21/2019 1529   Lab Results  Component Value Date   HGBA1C 6.2 (H) 10/19/2018   HGBA1C 6.2 (H) 06/12/2018   HGBA1C 7.1 (H) 08/01/2017   HGBA1C 7.4 (H) 07/25/2017   HGBA1C 7.4 (H) 07/24/2017   Lab Results  Component Value Date   INSULIN 12.3 10/19/2018   INSULIN 16.3 06/12/2018   CBC    Component Value Date/Time   WBC 6.8 09/14/2017 0949   WBC 9.6 08/08/2017 0348    RBC 5.08 09/14/2017 0949   RBC 2.89 (L) 08/08/2017 0348   HGB 13.3 09/14/2017 0949   HCT 39.9 09/14/2017  0949   PLT 320 09/14/2017 0949   MCV 79 09/14/2017 0949   MCH 26.2 (L) 09/14/2017 0949   MCH 27.3 08/08/2017 0348   MCHC 33.3 09/14/2017 0949   MCHC 33.3 08/08/2017 0348   RDW 13.8 09/14/2017 0949   LYMPHSABS 1.8 07/24/2017 0518   MONOABS 0.7 07/24/2017 0518   EOSABS 0.1 07/24/2017 0518   BASOSABS 0.0 07/24/2017 0518   Iron/TIBC/Ferritin/ %Sat No results found for: IRON, TIBC, FERRITIN, IRONPCTSAT Lipid Panel     Component Value Date/Time   CHOL 111 03/21/2019 1529   TRIG 70 03/21/2019 1529   HDL 52 03/21/2019 1529   CHOLHDL 2.7 03/16/2018 0936   CHOLHDL 3.6 07/26/2017 0250   VLDL 25 07/26/2017 0250   LDLCALC 45 03/21/2019 1529   Hepatic Function Panel     Component Value Date/Time   PROT 6.4 03/21/2019 1529   ALBUMIN 4.0 03/21/2019 1529   AST 16 03/21/2019 1529   ALT 13 03/21/2019 1529   ALKPHOS 80 03/21/2019 1529   BILITOT 0.4 03/21/2019 1529   BILIDIR 0.14 03/16/2018 0936      Component Value Date/Time   TSH 1.560 06/12/2018 0808   Results for KALA, AMBRIZ (MRN [id number]) as of 04/12/2019 13:43  Ref. Range 03/21/2019 15:29  Vitamin D, 25-Hydroxy Latest Ref Range: 30.0 - 100.0 ng/mL 47.1   OBESITY BEHAVIORAL INTERVENTION VISIT  Today's visit was #16  Starting weight: 260 lbs Starting date: 06/08/2018 Today's weight: 249 ;bs Today's date: 04/12/2019 Total lbs lost to date: 11   04/12/2019  Height 5\' 3"  (1.6 m)  Weight 249 lb (112.9 kg)  BMI (Calculated) 44.12  BLOOD PRESSURE - SYSTOLIC 409  BLOOD PRESSURE - DIASTOLIC 81   Body Fat % 81.1 %  Total Body Water (lbs) 86.4 lbs   ASK: We discussed the diagnosis of obesity with [name] today and [name] agreed to give [name] permission to discuss obesity behavioral modification therapy today.  ASSESS: [name] has the diagnosis of obesity and her BMI today is 44.2. Courtlynn is in the action  stage of change.   ADVISE: Blessed was educated on the multiple health risks of obesity as well as the benefit of weight loss to improve her health. She was advised of the need for long term treatment and the importance of lifestyle modifications to improve her current health and to decrease her risk of future health problems.  AGREE: Multiple dietary modification options and treatment options were discussed and  [name] agreed to follow the recommendations documented in the above note.  ARRANGE: [name] was educated on the importance of frequent visits to treat obesity as outlined per CMS and USPSTF guidelines and agreed to schedule her next follow up appointment today.  Migdalia Dk, am acting as transcriptionist for Abby Potash, PA-C I, Abby Potash, PA-C have reviewed above note and agree with its content

## 2019-04-30 ENCOUNTER — Ambulatory Visit (INDEPENDENT_AMBULATORY_CARE_PROVIDER_SITE_OTHER): Payer: BC Managed Care – PPO | Admitting: Bariatrics

## 2019-05-09 ENCOUNTER — Ambulatory Visit (INDEPENDENT_AMBULATORY_CARE_PROVIDER_SITE_OTHER): Payer: BC Managed Care – PPO | Admitting: Bariatrics

## 2019-05-09 ENCOUNTER — Encounter (INDEPENDENT_AMBULATORY_CARE_PROVIDER_SITE_OTHER): Payer: Self-pay | Admitting: Bariatrics

## 2019-05-09 ENCOUNTER — Other Ambulatory Visit: Payer: Self-pay

## 2019-05-09 VITALS — BP 117/80 | HR 69 | Temp 97.9°F | Ht 63.0 in | Wt 250.0 lb

## 2019-05-09 DIAGNOSIS — E8881 Metabolic syndrome: Secondary | ICD-10-CM | POA: Diagnosis not present

## 2019-05-09 DIAGNOSIS — Z9189 Other specified personal risk factors, not elsewhere classified: Secondary | ICD-10-CM

## 2019-05-09 DIAGNOSIS — E66813 Obesity, class 3: Secondary | ICD-10-CM

## 2019-05-09 DIAGNOSIS — Z6841 Body Mass Index (BMI) 40.0 and over, adult: Secondary | ICD-10-CM

## 2019-05-09 DIAGNOSIS — E88819 Insulin resistance, unspecified: Secondary | ICD-10-CM

## 2019-05-09 DIAGNOSIS — E559 Vitamin D deficiency, unspecified: Secondary | ICD-10-CM

## 2019-05-09 MED ORDER — VITAMIN D (ERGOCALCIFEROL) 1.25 MG (50000 UNIT) PO CAPS: 50000.0000 [IU] | ORAL_CAPSULE | ORAL | 0 refills | Status: DC

## 2019-05-14 NOTE — Progress Notes (Signed)
Office: [phone]  /  Fax: [phone]   HPI:   Chief Complaint: OBESITY [name] is here to discuss her progress with her obesity treatment plan. She is on the Category 2 plan and is following her eating plan approximately 60 % of the time. She states she is exercising 0 minutes 0 times per week. [name]'s weight is up 1 pound. She is doing better with water. Her weight is 250 lb (113.4 kg) today and has had a weight gain of 1 pound over a period of 4 weeks since her last visit. She has lost 10 lbs since starting treatment with [name].  Vitamin D deficiency [name] has a diagnosis of vitamin D deficiency. She is currently taking vit D and denies nausea, vomiting or muscle weakness.  At risk for osteopenia and osteoporosis [name] is at higher risk of osteopenia and osteoporosis due to vitamin D deficiency.   Insulin Resistance [name] has a diagnosis of insulin resistance based on her elevated fasting insulin level >5. Although [name]'s blood glucose readings are still under good control, insulin resistance puts her at greater risk of metabolic syndrome and diabetes. Her last A1c was at 5.2 and last insulin level was at 17.6 She is not taking metformin currently and continues to work on diet and exercise to decrease risk of diabetes. She denies polyphagia.  ASSESSMENT AND PLAN:  Vitamin D deficiency - Plan: Vitamin D, Ergocalciferol, [name]  Insulin resistance  At risk for osteoporosis  Class 3 severe obesity with serious comorbidity and body mass index (BMI) of 40.0 to 44.9 in adult, unspecified obesity type ([place])  PLAN:  Vitamin D Deficiency [name] was informed that low vitamin D levels contributes to fatigue and are associated with obesity, breast, and colon cancer. [name] agrees to continue to take prescription Vit D @50 ,000 IU every 2 weeks #2 with no refills and she will follow up for routine testing of vitamin D, at least 2-3 times per  year. She was informed of the risk of over-replacement of vitamin D and agrees to not increase her dose unless she discusses this with [name] first. [name] agrees to follow up as directed.  At risk for osteopenia and osteoporosis [name] was given extended  (15 minutes) osteoporosis prevention counseling today. [name] is at risk for osteopenia and osteoporosis due to her vitamin D deficiency. She was encouraged to take her vitamin D and follow her higher calcium diet and increase strengthening exercise to help strengthen her bones and decrease her risk of osteopenia and osteoporosis.  Insulin Resistance [name] will continue to work on weight loss, increasing exercise, increasing lean protein and decreasing simple carbohydrates in her diet to help decrease the risk of diabetes. She was informed that eating too many simple carbohydrates or too many calories at one sitting increases the likelihood of GI side effects. [name] agreed to follow up with [name] as directed to monitor her progress.  Obesity [name] is currently in the action stage of change. As such, her goal is to continue with weight loss efforts She has agreed to follow the Category 2 plan [name] has been instructed to work up to a goal of 150 minutes of combined cardio and strengthening exercise per week for weight loss and overall health benefits. We discussed the following Behavioral Modification Strategies today: increase H2O intake, no skipping meals, keeping healthy foods in the home,  increasing lean protein intake, decreasing simple carbohydrates, increasing vegetables, decrease eating out and work on meal planning  and intentional eating  [name] has agreed to follow up with our clinic in 2 weeks. She was informed of the importance of frequent follow up visits to maximize her success with intensive lifestyle modifications for her multiple health conditions.  ALLERGIES: No Known Allergies  MEDICATIONS: Current Outpatient  Medications on File Prior to Visit  Medication Sig Dispense Refill  . acetaminophen (TYLENOL) 500 MG tablet Take 500 mg by mouth every 6 (six) hours as needed for mild pain or headache.    Marland Kitchen aspirin EC 81 MG EC tablet Take 1 tablet (81 mg total) daily by mouth.    Marland Kitchen atorvastatin (LIPITOR) 40 MG tablet Take 1 tablet (40 mg total) by mouth daily. 90 tablet 3  . clopidogrel (PLAVIX) 75 MG tablet Take 1 tablet (75 mg total) by mouth daily. 90 tablet 2  . Coenzyme Q10 (COQ10) 100 MG CAPS Take 100 mg by mouth daily.    . colchicine 0.6 MG tablet Take two (2) tablets by mouth at time of initial pain. Then take one (1) tablet by mouth twice daily as needed for gout.    . Dapagliflozin-metFORMIN HCl ER (XIGDUO XR) 06-999 MG TB24 Take 10-1,000 capsules by mouth.    . Dulaglutide (TRULICITY) 1.5 0000000 SOPN Inject 1 Dose into the skin once a week.    . metoprolol tartrate (LOPRESSOR) 25 MG tablet TAKE 1 TABLET(25 MG) BY MOUTH TWICE DAILY 180 tablet 2  . Misc Natural Products (DAILY HERBS BONE/JOINTS PO) Take 3-5 tablets daily by mouth. Insta flex    . olmesartan (BENICAR) 5 MG tablet TAKE 1 TABLET(5 MG) BY MOUTH DAILY 90 tablet 2   No current facility-administered medications on file prior to visit.     PAST MEDICAL HISTORY: Past Medical History:  Diagnosis Date  . Anxiety   . Back pain   . Chest pain   . Coronary artery disease   . Diabetes mellitus   . Difficulty swallowing pills   . Fatigue   . GERD (gastroesophageal reflux disease)   . Hay fever   . Headache   . Heartburn   . History of heart attack   . HTN (hypertension)   . Hyperlipidemia   . Joint pain   . Knee pain   . Nervousness   . Palpitations   . Shortness of breath   . Shortness of breath on exertion   . Stress     PAST SURGICAL HISTORY: Past Surgical History:  Procedure Laterality Date  . CORONARY ARTERY BYPASS GRAFT N/A 08/02/2017   Procedure: CORONARY ARTERY BYPASS GRAFTING (CABG) X 5 (LIMA to LAD, LEFT RADIAL  ARTERY to DIAGONAL, SVG to SEQUENTIALLY PDA and PLB) , USING LEFT INTERNAL MAMMARY ARTERY, LEFT RADIAL ARTERY, AND  SAPHENOUS VEIN to [place], RIGHT GREATER SAPHENOUS VEIN HARVESTED ENDOSCOPICALLY;  Surgeon: Grace Isaac, MD;  Location: [place];  Service: Open Heart Surgery;  Laterality: N/A;  . CORONARY/GRAFT ACUTE MI REVASCULARIZATION N/A 07/24/2017   Procedure: Coronary/Graft Acute MI Revascularization;  Surgeon: Belva Crome, MD;  Location: [facility];  Service: Cardiovascular;  Laterality: N/A;  . LAPAROSCOPIC GASTRIC BANDING  09/02/2008  . LEFT HEART CATH AND CORONARY ANGIOGRAPHY N/A 07/24/2017   Procedure: LEFT HEART CATH AND CORONARY ANGIOGRAPHY;  Surgeon: Belva Crome, MD;  Location: [facility];  Service: Cardiovascular;  Laterality: N/A;  . RADIAL ARTERY HARVEST Left 08/02/2017   Procedure: LEFT RADIAL ARTERY HARVEST;  Surgeon: Grace Isaac, MD;  Location: [place];  Service:  Open Heart Surgery;  Laterality: Left;  . STERNAL CLOSURE N/A 08/02/2017   Procedure: STERNAL PLATING;  Surgeon: Grace Isaac, MD;  Location: [place];  Service: Open Heart Surgery;  Laterality: N/A;  . Sun   had metal removed from stomach as a child  . TEE WITHOUT CARDIOVERSION N/A 08/02/2017   Procedure: TRANSESOPHAGEAL ECHOCARDIOGRAM (TEE);  Surgeon: Grace Isaac, MD;  Location: [place];  Service: Open Heart Surgery;  Laterality: N/A;    SOCIAL HISTORY: Social History   Tobacco Use  . Smoking status: Never Smoker  . Smokeless tobacco: Never Used  Substance Use Topics  . Alcohol use: Yes    Comment: occ  . Drug use: No    FAMILY HISTORY: Family History  Problem Relation Age of Onset  . Diabetes Father   . Hypertension Father   . Heart disease Father   . Sudden death Father   . Hyperlipidemia Mother   . Hypertension Mother   . Obesity Mother   . Cancer Maternal Grandmother        lung  . Cancer Maternal Grandfather        prostate    ROS:  Review of Systems  Constitutional: Negative for weight loss.  Gastrointestinal: Negative for nausea and vomiting.  Musculoskeletal:       Negative for muscle weakness  Endo/Heme/Allergies:       Negative for polyphagia    PHYSICAL EXAM: Blood pressure 117/80, pulse 69, temperature 97.9 F (36.6 C), temperature source Oral, height 5\' 3"  (1.6 m), weight 250 lb (113.4 kg), SpO2 100 %. Body mass index is 44.29 kg/m. Physical Exam Vitals signs reviewed.  Constitutional:      Appearance: Normal appearance. She is well-developed. She is obese.  Cardiovascular:     Rate and Rhythm: Normal rate.  Pulmonary:     Effort: Pulmonary effort is normal.  Musculoskeletal: Normal range of motion.  Skin:    General: Skin is warm and dry.  Neurological:     Mental Status: She is alert and oriented to person, place, and time.  Psychiatric:        Mood and Affect: Mood normal.        Behavior: Behavior normal.     RECENT LABS AND TESTS: BMET    Component Value Date/Time   NA 139 03/21/2019 1529   K 4.4 03/21/2019 1529   CL 103 03/21/2019 1529   CO2 17 (L) 03/21/2019 1529   GLUCOSE 87 03/21/2019 1529   GLUCOSE 150 (H) 08/08/2017 0348   BUN 10 03/21/2019 1529   CREATININE 0.84 03/21/2019 1529   CALCIUM 8.7 03/21/2019 1529   GFRNONAA 88 03/21/2019 1529   GFRAA 102 03/21/2019 1529   Lab Results  Component Value Date   HGBA1C 6.2 (H) 10/19/2018   HGBA1C 6.2 (H) 06/12/2018   HGBA1C 7.1 (H) 08/01/2017   HGBA1C 7.4 (H) 07/25/2017   HGBA1C 7.4 (H) 07/24/2017   Lab Results  Component Value Date   INSULIN 12.3 10/19/2018   INSULIN 16.3 06/12/2018   CBC    Component Value Date/Time   WBC 6.8 09/14/2017 0949   WBC 9.6 08/08/2017 0348   RBC 5.08 09/14/2017 0949   RBC 2.89 (L) 08/08/2017 0348   HGB 13.3 09/14/2017 0949   HCT 39.9 09/14/2017 0949   PLT 320 09/14/2017 0949   MCV 79 09/14/2017 0949   MCH 26.2 (L) 09/14/2017 0949   MCH 27.3 08/08/2017 0348   MCHC 33.3 09/14/2017  0949  MCHC 33.3 08/08/2017 0348   RDW 13.8 09/14/2017 0949   LYMPHSABS 1.8 07/24/2017 0518   MONOABS 0.7 07/24/2017 0518   EOSABS 0.1 07/24/2017 0518   BASOSABS 0.0 07/24/2017 0518   Iron/TIBC/Ferritin/ %Sat No results found for: IRON, TIBC, FERRITIN, IRONPCTSAT Lipid Panel     Component Value Date/Time   CHOL 111 03/21/2019 1529   TRIG 70 03/21/2019 1529   HDL 52 03/21/2019 1529   CHOLHDL 2.7 03/16/2018 0936   CHOLHDL 3.6 07/26/2017 0250   VLDL 25 07/26/2017 0250   LDLCALC 45 03/21/2019 1529   Hepatic Function Panel     Component Value Date/Time   PROT 6.4 03/21/2019 1529   ALBUMIN 4.0 03/21/2019 1529   AST 16 03/21/2019 1529   ALT 13 03/21/2019 1529   ALKPHOS 80 03/21/2019 1529   BILITOT 0.4 03/21/2019 1529   BILIDIR 0.14 03/16/2018 0936      Component Value Date/Time   TSH 1.560 06/12/2018 0808      OBESITY BEHAVIORAL INTERVENTION VISIT  Today's visit was # 18   Starting weight: 260 lbs Starting date: 06/08/2018 Today's weight : 250 lbs Today's date: 05/09/2019 Total lbs lost to date: 10    05/09/2019  Height 5\' 3"  (1.6 m)  Weight 250 lb (113.4 kg)  BMI (Calculated) 44.3  BLOOD PRESSURE - SYSTOLIC [id number]  BLOOD PRESSURE - DIASTOLIC 80   Body Fat % 99991111 %    ASK: We discussed the diagnosis of obesity with Kazaria D Nichols today and Barbette agreed to give [name] permission to discuss obesity behavioral modification therapy today.  ASSESS: Lourdez has the diagnosis of obesity and her BMI today is 44.3 Ladaja is in the action stage of change   ADVISE: Ronnesha was educated on the multiple health risks of obesity as well as the benefit of weight loss to improve her health. She was advised of the need for long term treatment and the importance of lifestyle modifications to improve her current health and to decrease her risk of future health problems.  AGREE: Multiple dietary modification options and treatment options were discussed and  Quetzalli agreed to  follow the recommendations documented in the above note.  ARRANGE: Xela was educated on the importance of frequent visits to treat obesity as outlined per CMS and USPSTF guidelines and agreed to schedule her next follow up appointment today.  Corey Skains, am acting as Location manager for [employer]. Owens Shark, DO  I have reviewed the above documentation for accuracy and completeness, and I agree with the above. -Jearld Lesch, DO

## 2019-05-15 ENCOUNTER — Encounter (INDEPENDENT_AMBULATORY_CARE_PROVIDER_SITE_OTHER): Payer: Self-pay | Admitting: Bariatrics

## 2019-05-18 ENCOUNTER — Telehealth: Payer: Self-pay | Admitting: Interventional Cardiology

## 2019-05-18 NOTE — Telephone Encounter (Signed)
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 She would like to get a note stating she is high risk for work so she can continue to work virtually.

## 2019-05-21 NOTE — Telephone Encounter (Signed)
Date 05/21/2019  To whom it may concern.  [name] (DOB 12-28-1980) has significant comorbidities that place her at increased risk of severe disease should she contract COVID-19 (SARS-CoV-2).  If at all possible, strict social distancing, masking, and handwashing should be observed.  The best case work scenario for her, would be virtual.  If I may provide additional information, do not hesitate to contact me.  Sincerely,   Farrel Gordon, MD, [facility]

## 2019-05-21 NOTE — Telephone Encounter (Signed)
° ° °  Patient calling to follow up on request for work note. Advised patient message had be sent to Dr Tamala Julian

## 2019-05-22 ENCOUNTER — Encounter: Payer: Self-pay | Admitting: *Deleted

## 2019-05-22 NOTE — Telephone Encounter (Signed)
Spoke with pt and made her aware that letter was available in [place] or I could get a signed copy ready tomorrow.  Pt would like to pick up a signed copy just in case her employer needs it.  Advised to come by tomorrow to pick up.  Pt agreeable to plan.

## 2019-05-24 ENCOUNTER — Ambulatory Visit (INDEPENDENT_AMBULATORY_CARE_PROVIDER_SITE_OTHER): Payer: BC Managed Care – PPO | Admitting: Bariatrics

## 2019-06-06 ENCOUNTER — Other Ambulatory Visit: Payer: Self-pay

## 2019-06-06 ENCOUNTER — Ambulatory Visit (INDEPENDENT_AMBULATORY_CARE_PROVIDER_SITE_OTHER): Payer: BC Managed Care – PPO | Admitting: Physician Assistant

## 2019-06-06 VITALS — BP 117/83 | HR 66 | Temp 97.8°F | Ht 63.0 in | Wt 248.0 lb

## 2019-06-06 DIAGNOSIS — E559 Vitamin D deficiency, unspecified: Secondary | ICD-10-CM

## 2019-06-06 DIAGNOSIS — E119 Type 2 diabetes mellitus without complications: Secondary | ICD-10-CM

## 2019-06-06 DIAGNOSIS — Z9189 Other specified personal risk factors, not elsewhere classified: Secondary | ICD-10-CM

## 2019-06-06 DIAGNOSIS — Z6841 Body Mass Index (BMI) 40.0 and over, adult: Secondary | ICD-10-CM

## 2019-06-06 MED ORDER — VITAMIN D (ERGOCALCIFEROL) 1.25 MG (50000 UNIT) PO CAPS: 50000.0000 [IU] | ORAL_CAPSULE | ORAL | 0 refills | Status: DC

## 2019-06-11 NOTE — Progress Notes (Signed)
Office: [phone]  /  Fax: [phone]   HPI:   Chief Complaint: OBESITY [name] is here to discuss her progress with her obesity treatment plan. She is on the Category 2 plan and is following her eating plan approximately 80 % of the time. She states she is walking 40 minutes 2 times per week. [name] reports that she is doing better with getting her protein in during the day. Her weight is 248 lb (112.5 kg) today and has had a weight loss of 2 pounds over a period of 4 weeks since her last visit. She has lost 12 lbs since starting treatment with [name].  Vitamin D deficiency [name] has a diagnosis of vitamin D deficiency. [name] is currently taking vit D and she denies nausea, vomiting or muscle weakness.  At risk for osteopenia and osteoporosis [name] is at higher risk of osteopenia and osteoporosis due to vitamin D deficiency.   Diabetes II [name] has a diagnosis of diabetes type II. She is on [place]. She denies nausea, vomiting, diarrhea or polyphagia. [name] states fasting BGs average between 90 and 100. She has been working on intensive lifestyle modifications including diet, exercise, and weight loss to help control her blood glucose levels.  ASSESSMENT AND PLAN:  Vitamin D deficiency - Plan: Vitamin D, Ergocalciferol, [name]  Type 2 diabetes mellitus without complication, without long-term current use of insulin (HCC)  At risk for osteoporosis  Class 3 severe obesity with serious comorbidity and body mass index (BMI) of 40.0 to 44.9 in adult, unspecified obesity type ([place])  PLAN:  Vitamin D Deficiency [name] was informed that low vitamin D levels contributes to fatigue and are associated with obesity, breast, and colon cancer. Capricia agrees to continue to take prescription Vit D @50 ,000 IU every other week #2 with no refills and she will follow up for routine testing of vitamin D, at least 2-3 times per year. She was  informed of the risk of over-replacement of vitamin D and agrees to not increase her dose unless she discusses this with [name] first. [name] agrees to follow up with our clinic in 2 to 3 weeks.  At risk for osteopenia and osteoporosis [name] was given extended  (15 minutes) osteoporosis prevention counseling today. [name] is at risk for osteopenia and osteoporosis due to her vitamin D deficiency. She was encouraged to take her vitamin D and follow her higher calcium diet and increase strengthening exercise to help strengthen her bones and decrease her risk of osteopenia and osteoporosis.  Diabetes II [name] has been given extensive diabetes education by myself today including ideal fasting and post-prandial blood glucose readings, individual ideal Hgb A1c goals and hypoglycemia prevention. We discussed the importance of good blood sugar control to decrease the likelihood of diabetic complications such as nephropathy, neuropathy, limb loss, blindness, coronary artery disease, and death. We discussed the importance of intensive lifestyle modification including diet, exercise and weight loss as the first line treatment for diabetes. [name] agrees to continue with medications and weight loss and she will follow up at the agreed upon time.  Obesity [name] is currently in the action stage of change. As such, her goal is to continue with weight loss efforts She has agreed to change to the Pescatarian eating plan [name] has been instructed to work up to a goal of 150 minutes of combined cardio and strengthening exercise per week for weight loss and overall health benefits. We discussed the following Behavioral Modification  Strategies today: keeping healthy foods in the home and work on meal planning and easy cooking plans  [name] has agreed to follow up with our clinic in 2 to 3 weeks. She was informed of the importance of frequent follow up visits to maximize her success with intensive lifestyle  modifications for her multiple health conditions.  ALLERGIES: No Known Allergies  MEDICATIONS: Current Outpatient Medications on File Prior to Visit  Medication Sig Dispense Refill   acetaminophen (TYLENOL) 500 MG tablet Take 500 mg by mouth every 6 (six) hours as needed for mild pain or headache.     aspirin EC 81 MG EC tablet Take 1 tablet (81 mg total) daily by mouth.     atorvastatin (LIPITOR) 40 MG tablet Take 1 tablet (40 mg total) by mouth daily. 90 tablet 3   clopidogrel (PLAVIX) 75 MG tablet Take 1 tablet (75 mg total) by mouth daily. 90 tablet 2   Coenzyme Q10 (COQ10) 100 MG CAPS Take 100 mg by mouth daily.     colchicine 0.6 MG tablet Take two (2) tablets by mouth at time of initial pain. Then take one (1) tablet by mouth twice daily as needed for gout.     Dapagliflozin-metFORMIN HCl ER (XIGDUO XR) 06-999 MG TB24 Take 10-1,000 capsules by mouth.     Dulaglutide (TRULICITY) 1.5 0000000 SOPN Inject 1 Dose into the skin once a week.     metoprolol tartrate (LOPRESSOR) 25 MG tablet TAKE 1 TABLET(25 MG) BY MOUTH TWICE DAILY 180 tablet 2   Misc Natural Products (DAILY HERBS BONE/JOINTS PO) Take 3-5 tablets daily by mouth. Insta flex     olmesartan (BENICAR) 5 MG tablet TAKE 1 TABLET(5 MG) BY MOUTH DAILY 90 tablet 2   No current facility-administered medications on file prior to visit.     PAST MEDICAL HISTORY: Past Medical History:  Diagnosis Date   Anxiety    Back pain    Chest pain    Coronary artery disease    Diabetes mellitus    Difficulty swallowing pills    Fatigue    GERD (gastroesophageal reflux disease)    Hay fever    Headache    Heartburn    History of heart attack    HTN (hypertension)    Hyperlipidemia    Joint pain    Knee pain    Nervousness    Palpitations    Shortness of breath    Shortness of breath on exertion    Stress     PAST SURGICAL HISTORY: Past Surgical History:  Procedure Laterality Date    CORONARY ARTERY BYPASS GRAFT N/A 08/02/2017   Procedure: CORONARY ARTERY BYPASS GRAFTING (CABG) X 5 (LIMA to LAD, LEFT RADIAL ARTERY to DIAGONAL, SVG to SEQUENTIALLY PDA and PLB) , USING LEFT INTERNAL MAMMARY ARTERY, LEFT RADIAL ARTERY, AND  SAPHENOUS VEIN to [place], RIGHT GREATER SAPHENOUS VEIN HARVESTED ENDOSCOPICALLY;  Surgeon: Grace Isaac, MD;  Location: [facility];  Service: Open Heart Surgery;  Laterality: N/A;   CORONARY/GRAFT ACUTE MI REVASCULARIZATION N/A 07/24/2017   Procedure: Coronary/Graft Acute MI Revascularization;  Surgeon: Belva Crome, MD;  Location: [facility];  Service: Cardiovascular;  Laterality: N/A;   LAPAROSCOPIC GASTRIC BANDING  09/02/2008   LEFT HEART CATH AND CORONARY ANGIOGRAPHY N/A 07/24/2017   Procedure: LEFT HEART CATH AND CORONARY ANGIOGRAPHY;  Surgeon: Belva Crome, MD;  Location: [facility];  Service: Cardiovascular;  Laterality: N/A;   RADIAL ARTERY HARVEST Left 08/02/2017   Procedure:  LEFT RADIAL ARTERY HARVEST;  Surgeon: Grace Isaac, MD;  Location: [facility];  Service: Open Heart Surgery;  Laterality: Left;   STERNAL CLOSURE N/A 08/02/2017   Procedure: STERNAL PLATING;  Surgeon: Grace Isaac, MD;  Location: [facility];  Service: Open Heart Surgery;  Laterality: N/A;   STOMACH SURGERY  1982   had metal removed from stomach as a child   TEE WITHOUT CARDIOVERSION N/A 08/02/2017   Procedure: TRANSESOPHAGEAL ECHOCARDIOGRAM (TEE);  Surgeon: Grace Isaac, MD;  Location: [facility];  Service: Open Heart Surgery;  Laterality: N/A;    SOCIAL HISTORY: Social History   Tobacco Use   Smoking status: Never Smoker   Smokeless tobacco: Never Used  Substance Use Topics   Alcohol use: Yes    Comment: occ   Drug use: No    FAMILY HISTORY: Family History  Problem Relation Age of Onset   Diabetes Father    Hypertension Father    Heart disease Father    Sudden death Father    Hyperlipidemia Mother    Hypertension  Mother    Obesity Mother    Cancer Maternal Grandmother        lung   Cancer Maternal Grandfather        prostate    ROS: Review of Systems  Constitutional: Positive for weight loss.  Gastrointestinal: Negative for diarrhea, nausea and vomiting.  Musculoskeletal:       Negative for muscle weakness  Endo/Heme/Allergies:       Negative for polyphagia    PHYSICAL EXAM: Blood pressure 117/83, pulse 66, temperature 97.8 F (36.6 C), temperature source Oral, height 5\' 3"  (1.6 m), weight 248 lb (112.5 kg), SpO2 100 %. Body mass index is 43.93 kg/m. Physical Exam Vitals signs reviewed.  Constitutional:      Appearance: Normal appearance. She is well-developed. She is obese.  Cardiovascular:     Rate and Rhythm: Normal rate.  Pulmonary:     Effort: Pulmonary effort is normal.  Musculoskeletal: Normal range of motion.  Skin:    General: Skin is warm and dry.  Neurological:     Mental Status: She is alert and oriented to person, place, and time.  Psychiatric:        Mood and Affect: Mood normal.        Behavior: Behavior normal.     RECENT LABS AND TESTS: BMET    Component Value Date/Time   NA 139 03/21/2019 1529   K 4.4 03/21/2019 1529   CL 103 03/21/2019 1529   CO2 17 (L) 03/21/2019 1529   GLUCOSE 87 03/21/2019 1529   GLUCOSE 150 (H) 08/08/2017 0348   BUN 10 03/21/2019 1529   CREATININE 0.84 03/21/2019 1529   CALCIUM 8.7 03/21/2019 1529   GFRNONAA 88 03/21/2019 1529   GFRAA 102 03/21/2019 1529   Lab Results  Component Value Date   HGBA1C 6.2 (H) 10/19/2018   HGBA1C 6.2 (H) 06/12/2018   HGBA1C 7.1 (H) 08/01/2017   HGBA1C 7.4 (H) 07/25/2017   HGBA1C 7.4 (H) 07/24/2017   Lab Results  Component Value Date   INSULIN 12.3 10/19/2018   INSULIN 16.3 06/12/2018   CBC    Component Value Date/Time   WBC 6.8 09/14/2017 0949   WBC 9.6 08/08/2017 0348   RBC 5.08 09/14/2017 0949   RBC 2.89 (L) 08/08/2017 0348   HGB 13.3 09/14/2017 0949   HCT 39.9 09/14/2017  0949   PLT 320 09/14/2017 0949   MCV 79 09/14/2017 0949  MCH 26.2 (L) 09/14/2017 0949   MCH 27.3 08/08/2017 0348   MCHC 33.3 09/14/2017 0949   MCHC 33.3 08/08/2017 0348   RDW 13.8 09/14/2017 0949   LYMPHSABS 1.8 07/24/2017 0518   MONOABS 0.7 07/24/2017 0518   EOSABS 0.1 07/24/2017 0518   BASOSABS 0.0 07/24/2017 0518   Iron/TIBC/Ferritin/ %Sat No results found for: IRON, TIBC, FERRITIN, IRONPCTSAT Lipid Panel     Component Value Date/Time   CHOL 111 03/21/2019 1529   TRIG 70 03/21/2019 1529   HDL 52 03/21/2019 1529   CHOLHDL 2.7 03/16/2018 0936   CHOLHDL 3.6 07/26/2017 0250   VLDL 25 07/26/2017 0250   LDLCALC 45 03/21/2019 1529   Hepatic Function Panel     Component Value Date/Time   PROT 6.4 03/21/2019 1529   ALBUMIN 4.0 03/21/2019 1529   AST 16 03/21/2019 1529   ALT 13 03/21/2019 1529   ALKPHOS 80 03/21/2019 1529   BILITOT 0.4 03/21/2019 1529   BILIDIR 0.14 03/16/2018 0936      Component Value Date/Time   TSH 1.560 06/12/2018 0808     Ref. Range 03/21/2019 15:29  Vitamin D, 25-Hydroxy Latest Ref Range: 30.0 - 100.0 ng/mL 47.1    OBESITY BEHAVIORAL INTERVENTION VISIT  Today's visit was # 19   Starting weight: 260 lbs Starting date: 06/08/2018 Today's weight : 248 lbs Today's date: 06/06/2019 Total lbs lost to date: 12    06/06/2019  Height 5\' 3"  (1.6 m)  Weight 248 lb (112.5 kg)  BMI (Calculated) 43.94  BLOOD PRESSURE - SYSTOLIC [id number]  BLOOD PRESSURE - DIASTOLIC 83   Body Fat % [id number] %  Total Body Water (lbs) 87.8 lbs    ASK: We discussed the diagnosis of obesity with [name] today and [name] agreed to give [name] permission to discuss obesity behavioral modification therapy today.  ASSESS: [name] has the diagnosis of obesity and her BMI today is 43.94 [name] is in the action stage of change   ADVISE: [name] was educated on the multiple health risks of obesity as well as the benefit of weight loss to improve her health. She was advised of  the need for long term treatment and the importance of lifestyle modifications to improve her current health and to decrease her risk of future health problems.  AGREE: Multiple dietary modification options and treatment options were discussed and  [name] agreed to follow the recommendations documented in the above note.  ARRANGE: [name] was educated on the importance of frequent visits to treat obesity as outlined per CMS and USPSTF guidelines and agreed to schedule her next follow up appointment today.  Corey Skains, am acting as transcriptionist for Abby Potash, PA-C I, Abby Potash, PA-C have reviewed above note and agree with its content

## 2019-06-12 ENCOUNTER — Ambulatory Visit (INDEPENDENT_AMBULATORY_CARE_PROVIDER_SITE_OTHER): Payer: BC Managed Care – PPO | Admitting: Physician Assistant

## 2019-06-20 ENCOUNTER — Ambulatory Visit (INDEPENDENT_AMBULATORY_CARE_PROVIDER_SITE_OTHER): Payer: BC Managed Care – PPO | Admitting: Physician Assistant

## 2019-06-29 IMAGING — DX DG CHEST 1V PORT
1 series · 1 of 1 positions shown · non-contrast
Comparison: August 04, 2017

CLINICAL DATA: Chest pain

EXAM:
PORTABLE CHEST 1 VIEW

[chest]
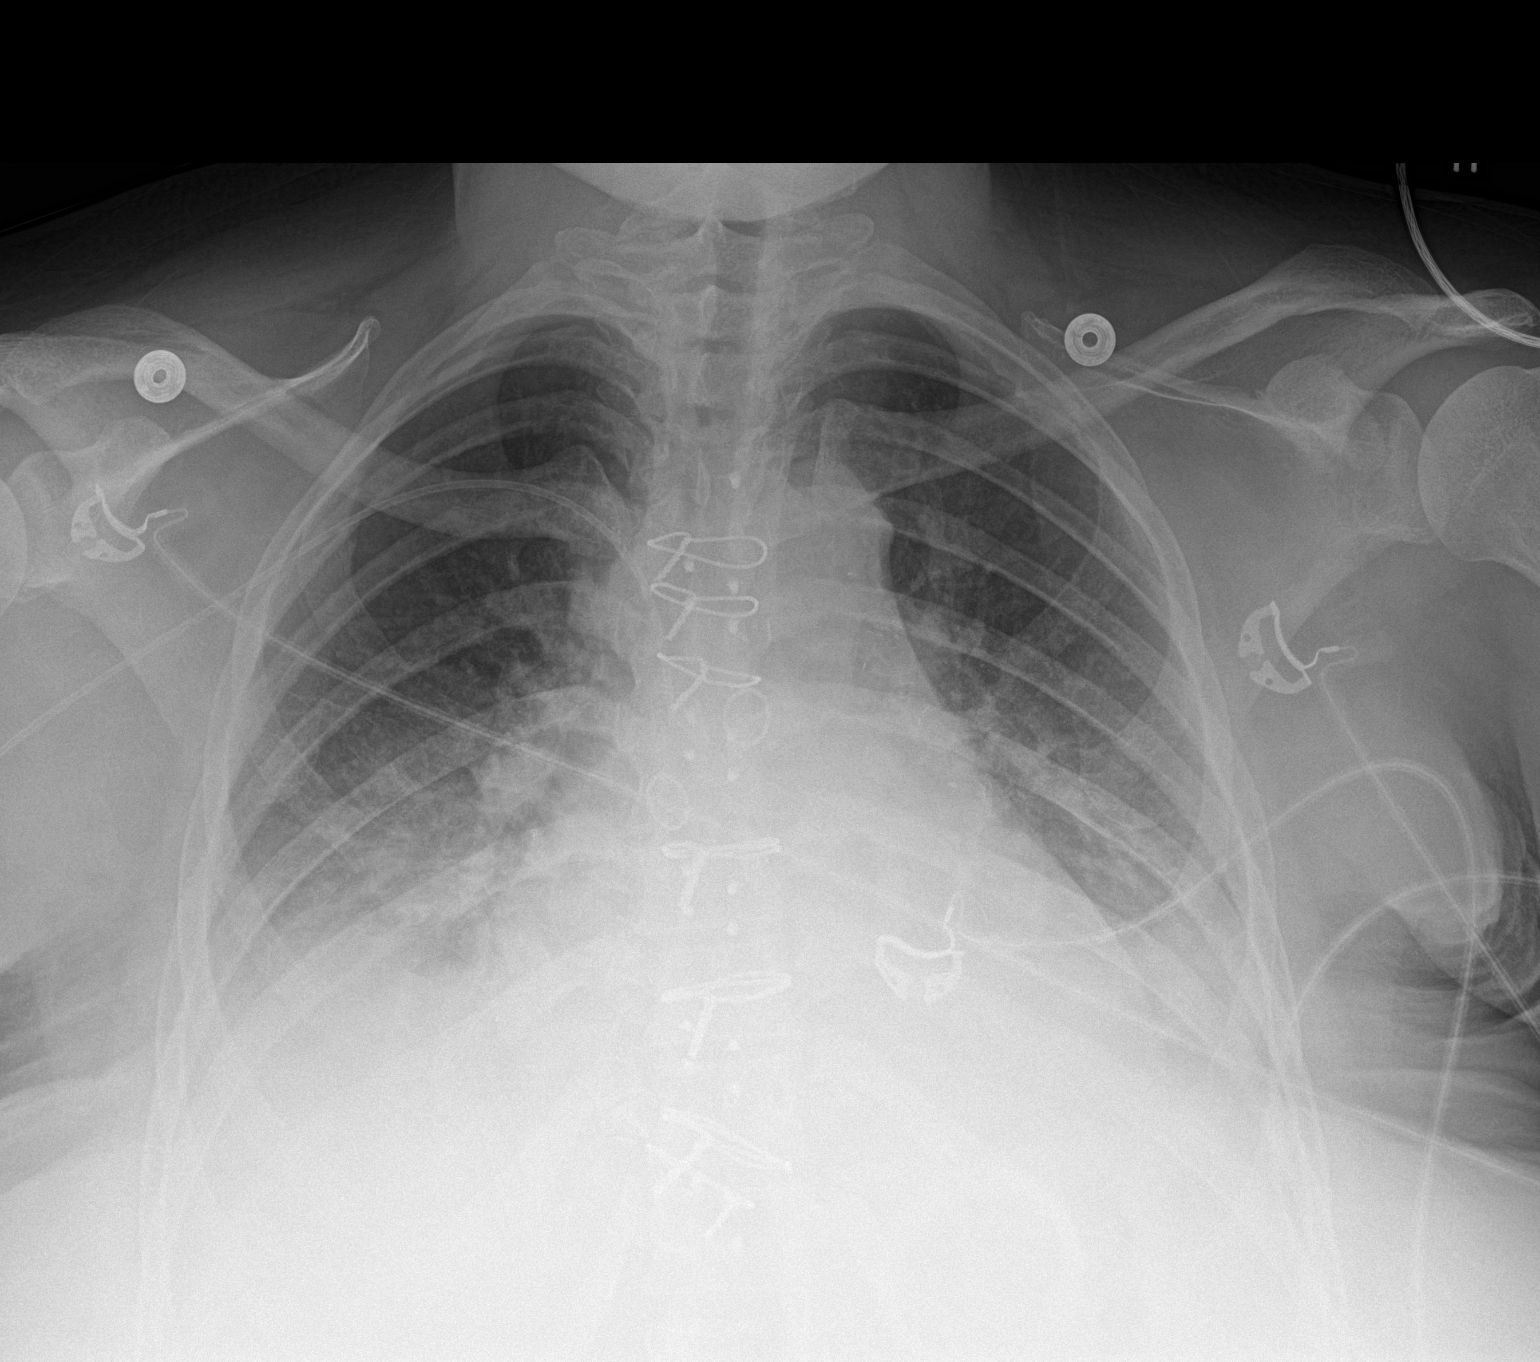

[1 of 1 positions shown; findings below may reference images not displayed]

FINDINGS: There is cardiomegaly with pulmonary vascularity within normal
limits. There are bilateral pleural effusions with bibasilar
atelectasis. Central catheter tip is in the superior vena cava. No
pneumothorax. Patient is status post coronary artery bypass
grafting. No bone lesions.
IMPRESSION: Bilateral pleural effusions with bibasilar atelectasis. Stable
cardiomegaly. Central catheter tip in superior vena cava. No evident
pneumothorax.

## 2019-06-30 IMAGING — CR DG CHEST 1V PORT
1 series · 1 of 1 positions shown · non-contrast
Comparison: 08/05/2017

CLINICAL DATA: Pleural effusion

EXAM:
PORTABLE CHEST 1 VIEW

[AP]
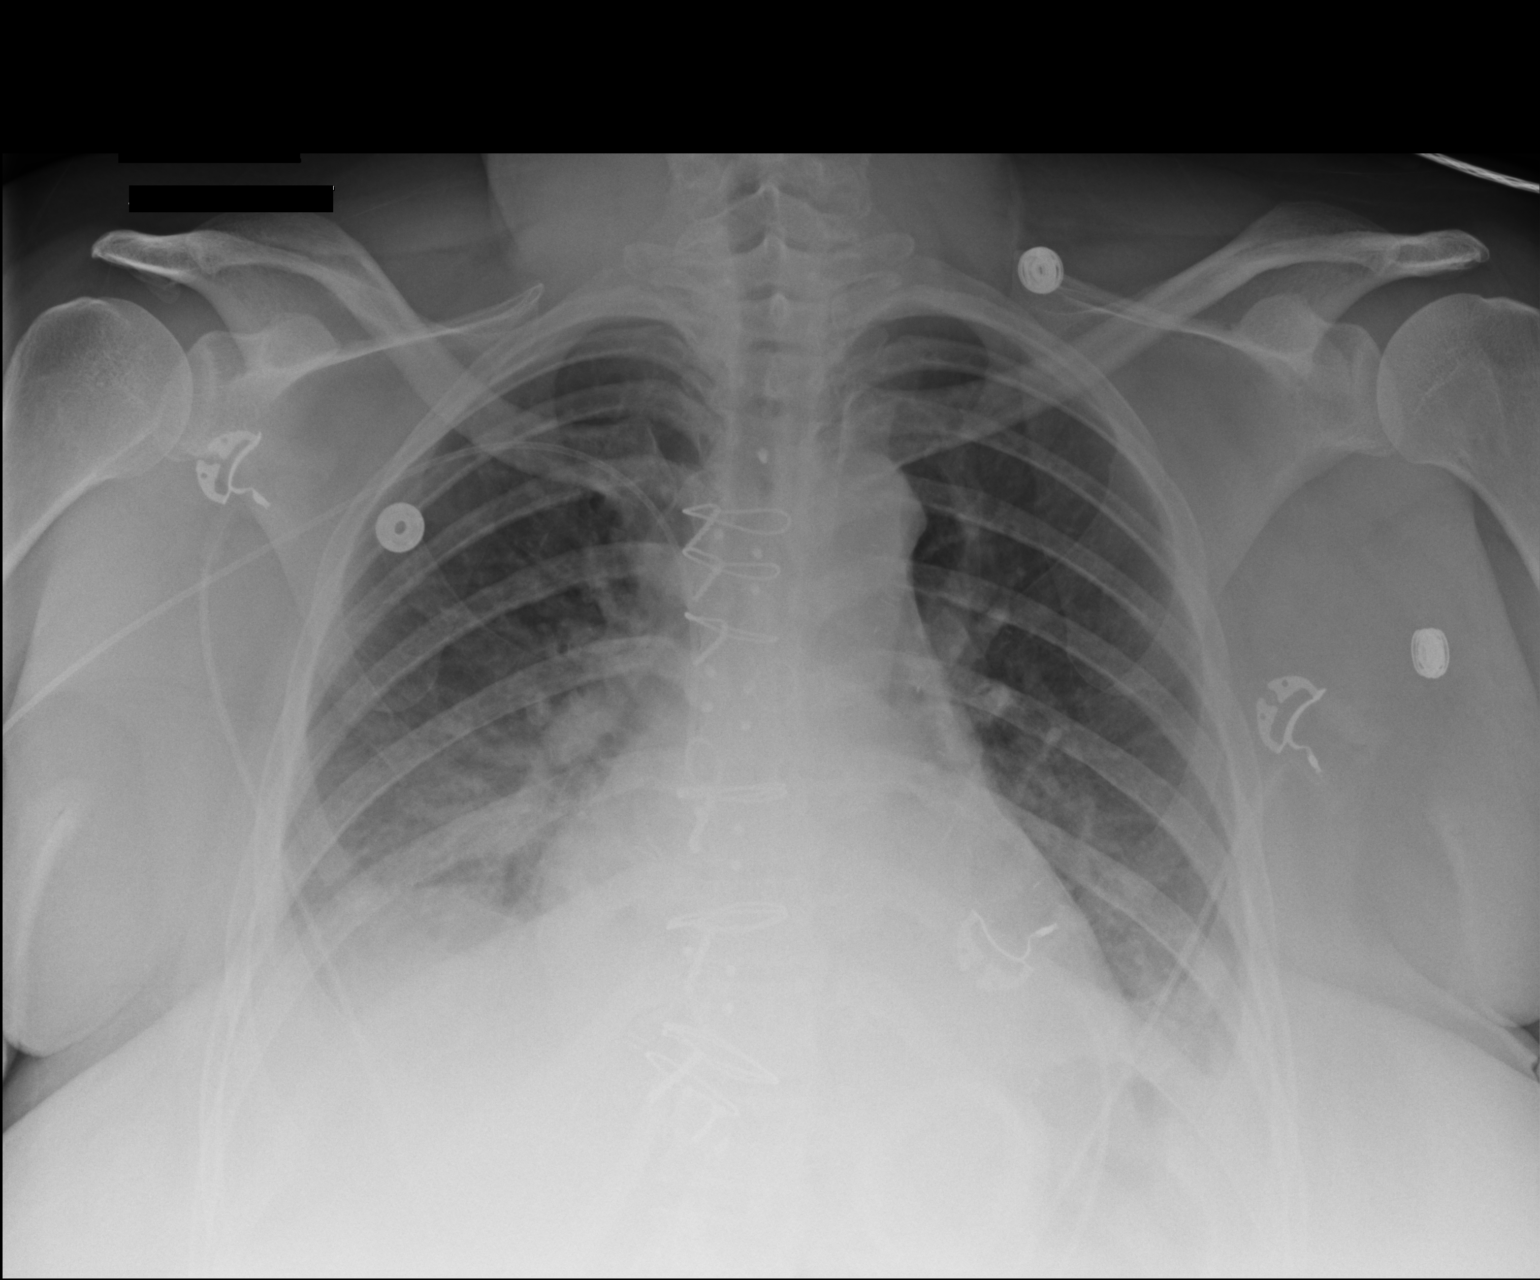

[1 of 1 positions shown; findings below may reference images not displayed]

FINDINGS: Prior CABG. The heart is mildly enlarged. Vascular congestion with
layering effusions and bibasilar atelectasis, slightly improved
since prior study.
IMPRESSION: Continued cardiomegaly and vascular congestion. Bibasilar
atelectasis and effusions, both improved since prior study.

## 2019-08-09 IMAGING — CR DG CHEST 2V
2 series · 2 of 2 positions shown · non-contrast
Comparison: 08/07/17

CLINICAL DATA: History of CABG.

EXAM:
CHEST  2 VIEW

[w chest pa]
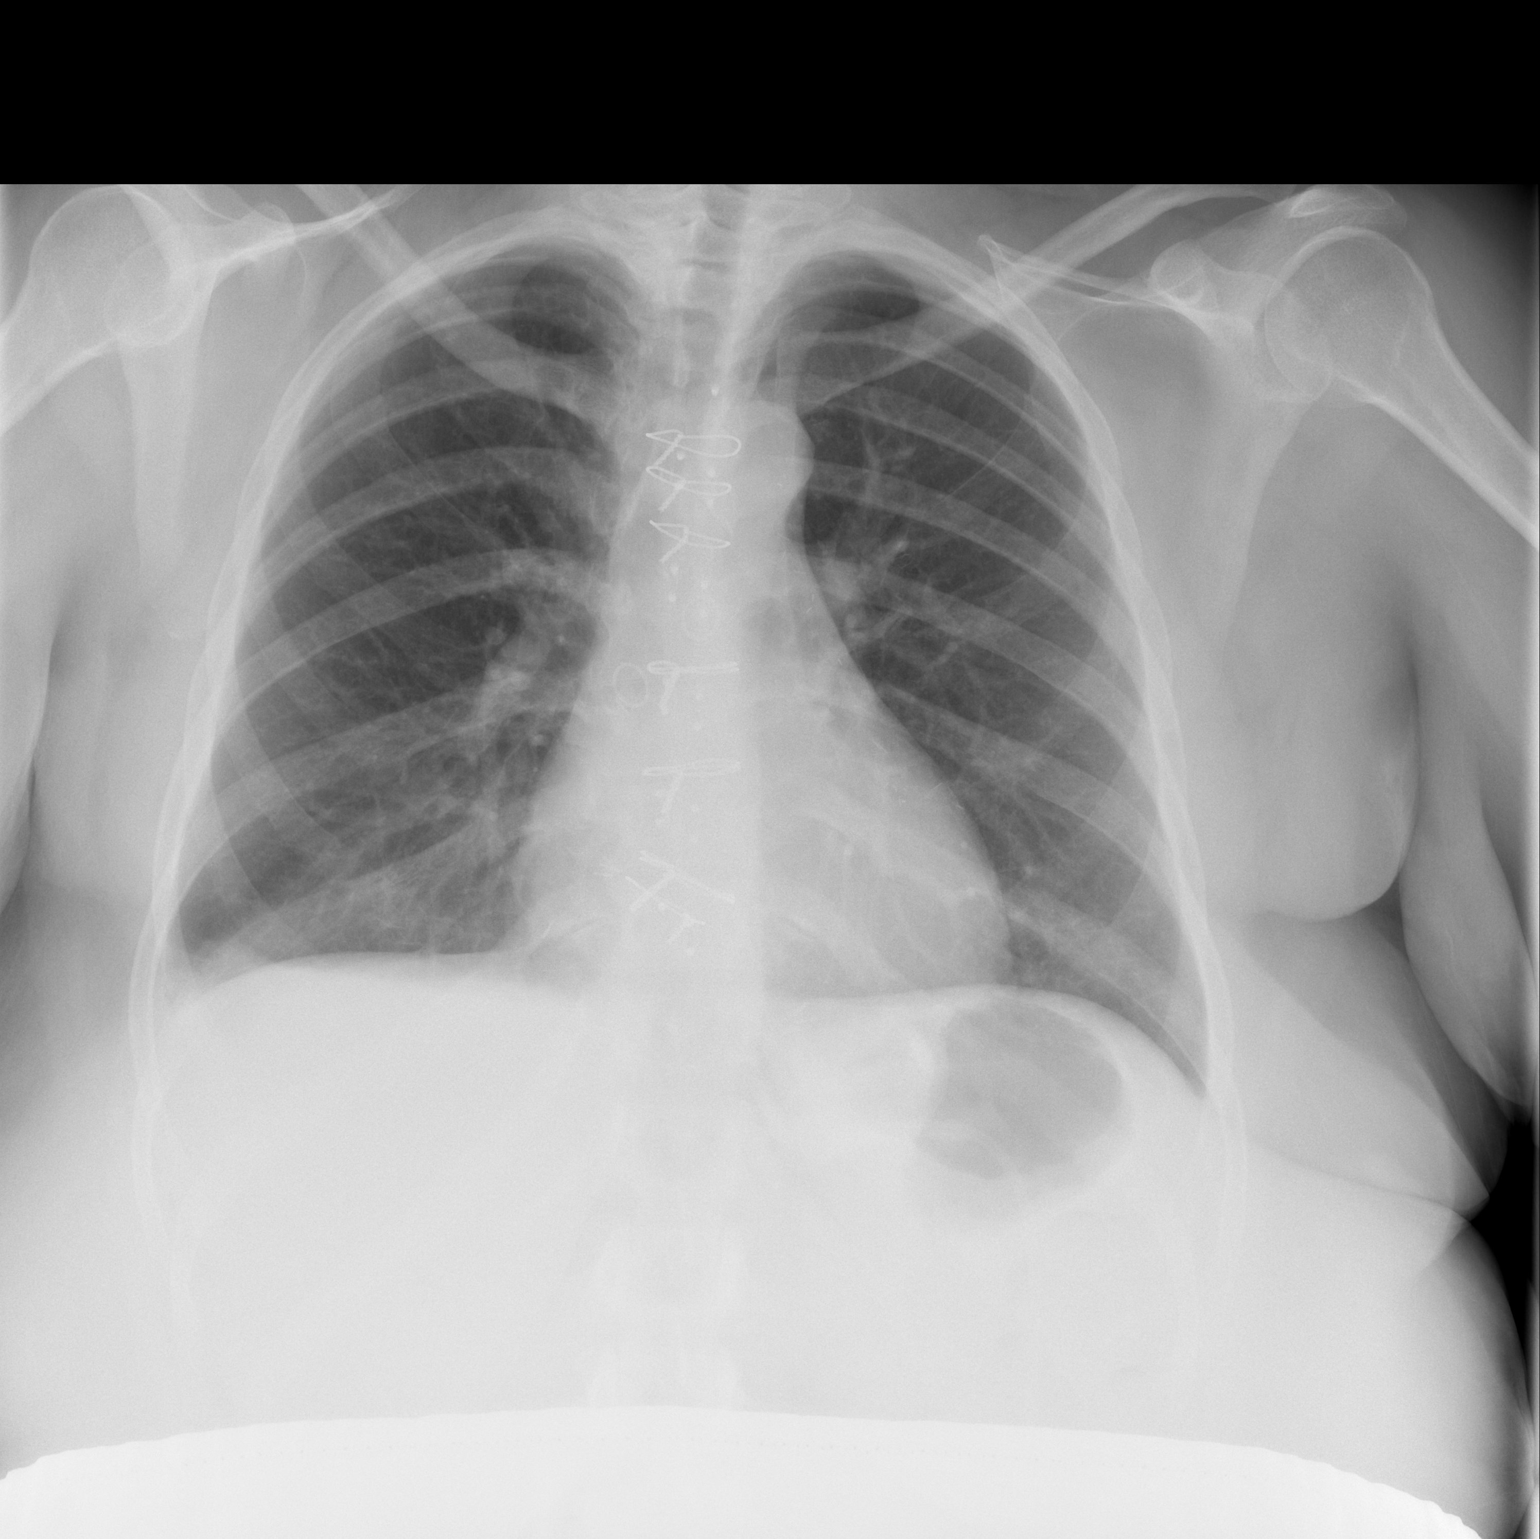

[w chest lat]
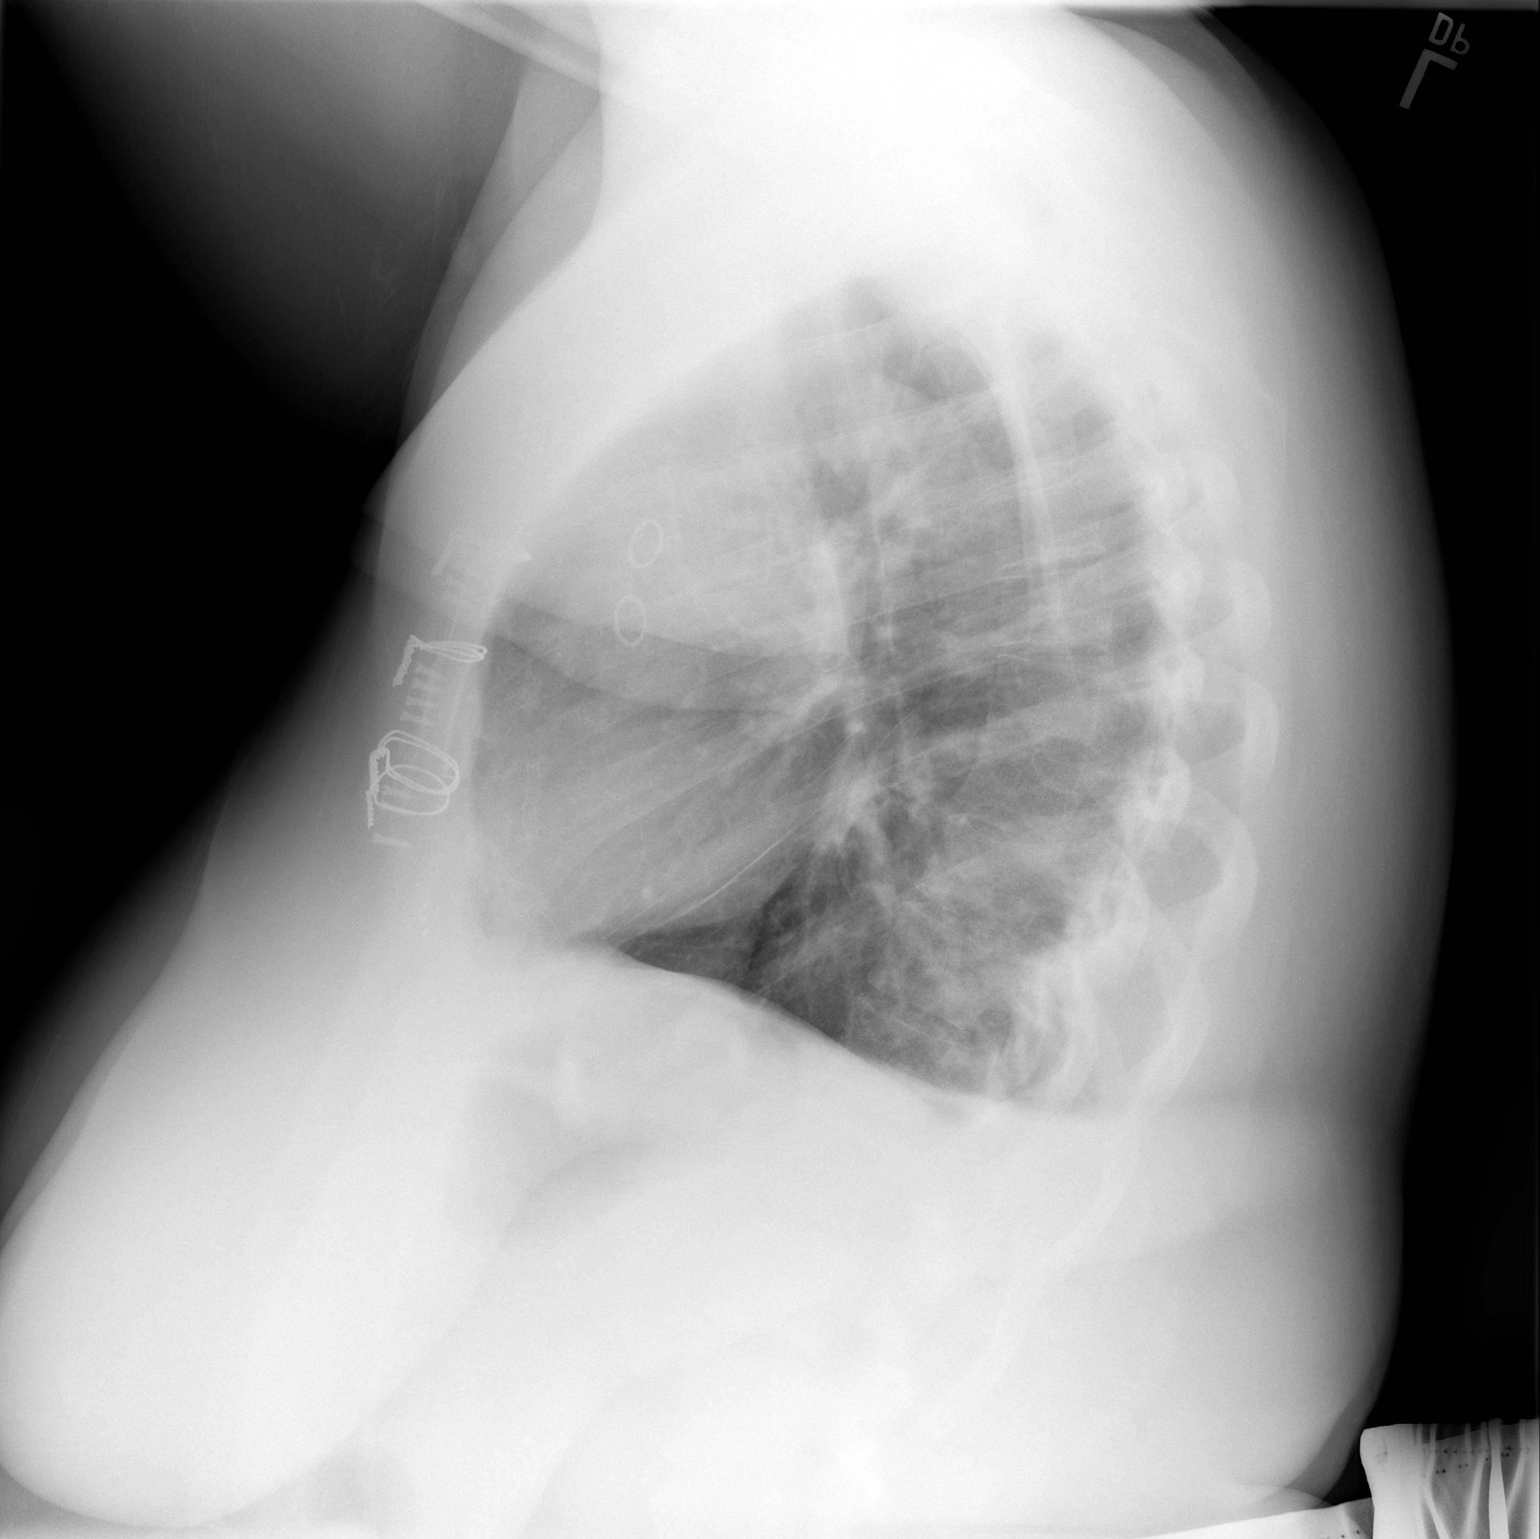

[2 of 2 positions shown; findings below may reference images not displayed]

FINDINGS: Previous median sternotomy and CABG procedure. Normal heart size.
Persistent small bilateral pleural effusions appear improved from
the previous exam. No new findings.
IMPRESSION: 1. Persistent but improved bilateral pleural effusions.

## 2019-08-29 ENCOUNTER — Ambulatory Visit (INDEPENDENT_AMBULATORY_CARE_PROVIDER_SITE_OTHER): Payer: BC Managed Care – PPO | Admitting: Family Medicine

## 2019-08-29 ENCOUNTER — Other Ambulatory Visit: Payer: Self-pay

## 2019-08-29 ENCOUNTER — Encounter (INDEPENDENT_AMBULATORY_CARE_PROVIDER_SITE_OTHER): Payer: Self-pay | Admitting: Family Medicine

## 2019-08-29 VITALS — BP 110/80 | HR 92 | Temp 97.9°F | Ht 63.0 in | Wt 246.0 lb

## 2019-08-29 DIAGNOSIS — Z9189 Other specified personal risk factors, not elsewhere classified: Secondary | ICD-10-CM

## 2019-08-29 DIAGNOSIS — E559 Vitamin D deficiency, unspecified: Secondary | ICD-10-CM | POA: Diagnosis not present

## 2019-08-29 DIAGNOSIS — E119 Type 2 diabetes mellitus without complications: Secondary | ICD-10-CM

## 2019-08-29 DIAGNOSIS — I252 Old myocardial infarction: Secondary | ICD-10-CM

## 2019-08-29 DIAGNOSIS — E1169 Type 2 diabetes mellitus with other specified complication: Secondary | ICD-10-CM

## 2019-08-29 DIAGNOSIS — Z9889 Other specified postprocedural states: Secondary | ICD-10-CM | POA: Diagnosis not present

## 2019-08-29 DIAGNOSIS — Z6841 Body Mass Index (BMI) 40.0 and over, adult: Secondary | ICD-10-CM

## 2019-08-29 MED ORDER — VITAMIN D (ERGOCALCIFEROL) 1.25 MG (50000 UNIT) PO CAPS: 50000.0000 [IU] | ORAL_CAPSULE | ORAL | 0 refills | Status: DC

## 2019-08-30 NOTE — Progress Notes (Deleted)
Office: [phone]  /  Fax: [phone]   HPI:   Chief Complaint: OBESITY [name] is here to discuss her progress with her obesity treatment plan. She is on the Category 2 plan and is following her eating plan approximately 50 % of the time. She states she is exercising 0 minutes 0 times per week. Annemarie's diabetes history was reviewed with her. Marland Kitchen  Her weight is 246 lb (111.6 kg) today and {misc; weight loss:12477} since her last visit. She has lost *** lbs since starting treatment with [name].  Diabetes II Aziya has a diagnosis of diabetes type II. Matasha states {home sugars:14018} and {Actions; denies/reports/admits to:19208} any hypoglycemic episodes. Last A1c was Hemoglobin A1C Latest Ref Rng & Units 10/19/2018  HGBA1C 4.8 - 5.6 % 6.2(H)  Some recent data might be hidden    She has been working on intensive lifestyle modifications including diet, exercise, and weight loss to help control her blood glucose levels.  Vitamin D Deficiency Deseri has a diagnosis of vitamin D deficiency. She {ACTION; IS/IS VG:4697475 currently taking vit D and denies nausea, vomiting or muscle weakness.  At risk for osteopenia and osteoporosis Danique is at higher risk of osteopenia and osteoporosis due to vitamin D deficiency.   History of Lap Band Surgery    History of Myocardial Infarction     ASSESSMENT AND PLAN:  Type 2 diabetes mellitus without complication, without long-term current use of insulin (HCC)  Vitamin D deficiency - Plan: Vitamin D, Ergocalciferol, [name]  History of gastric surgery  History of ST elevation myocardial infarction  At risk for osteoporosis  Class 3 severe obesity with serious comorbidity and body mass index (BMI) of 40.0 to 44.9 in adult, unspecified obesity type ([place])  PLAN:  Diabetes II Landy has been given diabetes education by myself today. Good blood sugar control is important to decrease the  likelihood of diabetic complications such as nephropathy, neuropathy, limb loss, blindness, coronary artery disease, and death. Intensive lifestyle modification including diet, exercise and weight loss were discussed as the first line treatment for diabetes.   Vitamin D Deficiency Low vitamin D level contributes to fatigue and are associated with obesity, breast, and colon cancer. She agrees to continue to take prescription Vit D 50,000 IU every week and will follow up for routine testing of vitamin D, at least 2-3 times per year to avoid over-replacement.  At risk for osteopenia and osteoporosis Chasitee was given extended (15 minutes) osteoporosis prevention counseling today. Tihanna is at risk for osteopenia and osteoporsis due to her vitamin D deficiency. She was encouraged to take her vitamin D and follow her higher calcium diet and increase strengthening exercise to help strengthen her bones and decrease her risk of osteopenia and osteoporosis.  History of Lap Band Surgery     History of Myocardial Infarction    Obesity Kegan {CHL AMB IS/IS NOT:210130109} currently in the action stage of change. As such, her goal is to {MWMwtloss#1:210800005} She has agreed to {MWMwtlossportion/plan#2:210800006} Jamonica has been instructed to work up to a goal of 150 minutes of combined cardio and strengthening exercise per week for weight loss and overall health benefits. We discussed the following Behavioral Modification Strategies today: {MWMwtlossdietstrategies#3:210800007}   Ernisha has agreed to follow up with our clinic in {NUMBER 1-10:22536} weeks. She was informed of the importance of frequent follow up visits to maximize her success with intensive lifestyle modifications for her multiple health conditions.  ALLERGIES: No Known Allergies  MEDICATIONS: Current Outpatient Medications on File Prior to Visit  Medication Sig Dispense Refill  . acetaminophen (TYLENOL) 500 MG tablet Take  500 mg by mouth every 6 (six) hours as needed for mild pain or headache.    Marland Kitchen aspirin EC 81 MG EC tablet Take 1 tablet (81 mg total) daily by mouth.    Marland Kitchen atorvastatin (LIPITOR) 40 MG tablet Take 1 tablet (40 mg total) by mouth daily. 90 tablet 3  . clopidogrel (PLAVIX) 75 MG tablet Take 1 tablet (75 mg total) by mouth daily. 90 tablet 2  . Coenzyme Q10 (COQ10) 100 MG CAPS Take 100 mg by mouth daily.    . colchicine 0.6 MG tablet Take two (2) tablets by mouth at time of initial pain. Then take one (1) tablet by mouth twice daily as needed for gout.    . Dapagliflozin-metFORMIN HCl ER (XIGDUO XR) 06-999 MG TB24 Take 10-1,000 capsules by mouth.    . Dulaglutide (TRULICITY) 1.5 0000000 SOPN Inject 1 Dose into the skin once a week.    . metoprolol tartrate (LOPRESSOR) 25 MG tablet TAKE 1 TABLET(25 MG) BY MOUTH TWICE DAILY 180 tablet 2  . Misc Natural Products (DAILY HERBS BONE/JOINTS PO) Take 3-5 tablets daily by mouth. Insta flex    . olmesartan (BENICAR) 5 MG tablet TAKE 1 TABLET(5 MG) BY MOUTH DAILY 90 tablet 2   No current facility-administered medications on file prior to visit.    PAST MEDICAL HISTORY: Past Medical History:  Diagnosis Date  . Anxiety   . Back pain   . Chest pain   . Coronary artery disease   . Diabetes mellitus   . Difficulty swallowing pills   . Fatigue   . GERD (gastroesophageal reflux disease)   . Hay fever   . Headache   . Heartburn   . History of heart attack   . HTN (hypertension)   . Hyperlipidemia   . Joint pain   . Knee pain   . Nervousness   . Palpitations   . Shortness of breath   . Shortness of breath on exertion   . Stress     PAST SURGICAL HISTORY: Past Surgical History:  Procedure Laterality Date  . CORONARY ARTERY BYPASS GRAFT N/A 08/02/2017   Procedure: CORONARY ARTERY BYPASS GRAFTING (CABG) X 5 (LIMA to LAD, LEFT RADIAL ARTERY to DIAGONAL, SVG to SEQUENTIALLY PDA and PLB) , USING LEFT INTERNAL MAMMARY ARTERY, LEFT RADIAL ARTERY, AND   SAPHENOUS VEIN to [place], RIGHT GREATER SAPHENOUS VEIN HARVESTED ENDOSCOPICALLY;  Surgeon: Grace Isaac, MD;  Location: [place];  Service: Open Heart Surgery;  Laterality: N/A;  . CORONARY/GRAFT ACUTE MI REVASCULARIZATION N/A 07/24/2017   Procedure: Coronary/Graft Acute MI Revascularization;  Surgeon: Belva Crome, MD;  Location: [place] [facility];  Service: Cardiovascular;  Laterality: N/A;  . LAPAROSCOPIC GASTRIC BANDING  09/02/2008  . LEFT HEART CATH AND CORONARY ANGIOGRAPHY N/A 07/24/2017   Procedure: LEFT HEART CATH AND CORONARY ANGIOGRAPHY;  Surgeon: Belva Crome, MD;  Location: [place] [facility];  Service: Cardiovascular;  Laterality: N/A;  . RADIAL ARTERY HARVEST Left 08/02/2017   Procedure: LEFT RADIAL ARTERY HARVEST;  Surgeon: Grace Isaac, MD;  Location: [place];  Service: Open Heart Surgery;  Laterality: Left;  . STERNAL CLOSURE N/A 08/02/2017   Procedure: STERNAL PLATING;  Surgeon: Grace Isaac, MD;  Location: [place];  Service: Open Heart Surgery;  Laterality: N/A;  . [place]   had metal removed from  stomach as a child  . TEE WITHOUT CARDIOVERSION N/A 08/02/2017   Procedure: TRANSESOPHAGEAL ECHOCARDIOGRAM (TEE);  Surgeon: Grace Isaac, MD;  Location: [place];  Service: Open Heart Surgery;  Laterality: N/A;    SOCIAL HISTORY: Social History   Tobacco Use  . Smoking status: Never Smoker  . Smokeless tobacco: Never Used  Substance Use Topics  . Alcohol use: Yes    Comment: occ  . Drug use: No    FAMILY HISTORY: Family History  Problem Relation Age of Onset  . Diabetes Father   . Hypertension Father   . Heart disease Father   . Sudden death Father   . Hyperlipidemia Mother   . Hypertension Mother   . Obesity Mother   . Cancer Maternal Grandmother        lung  . Cancer Maternal Grandfather        prostate    ROS: Review of Systems  Constitutional: Positive for weight loss.  Respiratory: Negative for shortness of breath.    Cardiovascular: Negative for chest pain.  Gastrointestinal: Negative for nausea and vomiting.  Musculoskeletal:       Negative muscle weakness    PHYSICAL EXAM: Blood pressure 110/80, pulse 92, temperature 97.9 F (36.6 C), temperature source Oral, height 5\' 3"  (1.6 m), weight 246 lb (111.6 kg), SpO2 100 %. Body mass index is 43.58 kg/m. Physical Exam Vitals reviewed.  Constitutional:      Appearance: Normal appearance. She is obese.  Cardiovascular:     Rate and Rhythm: Normal rate.     Pulses: Normal pulses.  Pulmonary:     Effort: Pulmonary effort is normal.     Breath sounds: Normal breath sounds.  Musculoskeletal:        General: Normal range of motion.  Skin:    General: Skin is warm and dry.  Neurological:     Mental Status: She is alert and oriented to person, place, and time.  Psychiatric:        Mood and Affect: Mood normal.        Behavior: Behavior normal.     RECENT LABS AND TESTS: BMET    Component Value Date/Time   NA 139 03/21/2019 1529   K 4.4 03/21/2019 1529   CL 103 03/21/2019 1529   CO2 17 (L) 03/21/2019 1529   GLUCOSE 87 03/21/2019 1529   GLUCOSE 150 (H) 08/08/2017 0348   BUN 10 03/21/2019 1529   CREATININE 0.84 03/21/2019 1529   CALCIUM 8.7 03/21/2019 1529   GFRNONAA 88 03/21/2019 1529   GFRAA 102 03/21/2019 1529   Lab Results  Component Value Date   HGBA1C 6.2 (H) 10/19/2018   HGBA1C 6.2 (H) 06/12/2018   HGBA1C 7.1 (H) 08/01/2017   HGBA1C 7.4 (H) 07/25/2017   HGBA1C 7.4 (H) 07/24/2017   Lab Results  Component Value Date   INSULIN 12.3 10/19/2018   INSULIN 16.3 06/12/2018   CBC    Component Value Date/Time   WBC 6.8 09/14/2017 0949   WBC 9.6 08/08/2017 0348   RBC 5.08 09/14/2017 0949   RBC 2.89 (L) 08/08/2017 0348   HGB 13.3 09/14/2017 0949   HCT 39.9 09/14/2017 0949   PLT 320 09/14/2017 0949   MCV 79 09/14/2017 0949   MCH 26.2 (L) 09/14/2017 0949   MCH 27.3 08/08/2017 0348   MCHC 33.3 09/14/2017 0949   MCHC 33.3  08/08/2017 0348   RDW 13.8 09/14/2017 0949   LYMPHSABS 1.8 07/24/2017 0518   MONOABS 0.7 07/24/2017 0518  EOSABS 0.1 07/24/2017 0518   BASOSABS 0.0 07/24/2017 0518   Iron/TIBC/Ferritin/ %Sat No results found for: IRON, TIBC, FERRITIN, IRONPCTSAT Lipid Panel     Component Value Date/Time   CHOL 111 03/21/2019 1529   TRIG 70 03/21/2019 1529   HDL 52 03/21/2019 1529   CHOLHDL 2.7 03/16/2018 0936   CHOLHDL 3.6 07/26/2017 0250   VLDL 25 07/26/2017 0250   LDLCALC 45 03/21/2019 1529   Hepatic Function Panel     Component Value Date/Time   PROT 6.4 03/21/2019 1529   ALBUMIN 4.0 03/21/2019 1529   AST 16 03/21/2019 1529   ALT 13 03/21/2019 1529   ALKPHOS 80 03/21/2019 1529   BILITOT 0.4 03/21/2019 1529   BILIDIR 0.14 03/16/2018 0936      Component Value Date/Time   TSH 1.560 06/12/2018 0808      OBESITY BEHAVIORAL INTERVENTION VISIT  Today's visit was # {Numbers; [id number]   Starting weight: *** Starting date: *** Today's weight : 246 lbs Today's date: 08/29/2019 Total lbs lost to date: ***    ASK: We discussed the diagnosis of obesity with Emersyn D Nichols today and Tywanda agreed to give [name] permission to discuss obesity behavioral modification therapy today.  ASSESS: Ashlynne has the diagnosis of obesity and her BMI today is @TBMI @ [place] {ACTION; IS/IS NOT:21021397} in the action stage of change   ADVISE: Chakita was educated on the multiple health risks of obesity as well as the benefit of weight loss to improve her health. She was advised of the need for long term treatment and the importance of lifestyle modifications to improve her current health and to decrease her risk of future health problems.  AGREE: Multiple dietary modification options and treatment options were discussed and  Antonietta agreed to follow the recommendations documented in the above note.  ARRANGE: Pennie was educated on the importance of frequent visits to treat obesity as outlined  per CMS and USPSTF guidelines and agreed to schedule her next follow up appointment today.  I, ***, am acting as transcriptionist for Briscoe Deutscher, DO

## 2019-08-30 NOTE — Progress Notes (Signed)
Office: [phone]  /  Fax: [phone]   HPI:  Chief Complaint: OBESITY [name] is here to discuss her progress with her obesity treatment plan. She is on the Category 2 plan and states she is following her eating plan approximately 50 % of the time. She states she is exercising 0 minutes 0 times per week.  [name]'s diabetes mellitus history was reviewed with her. She has a history of myocardial infarction in 07/2007 and has a history of coronary artery bypass graft 5 times. 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 Her weight in May was 350 lbs, she then had lap band and lost 100 lbs, and gained back 30 lbs. She is interested in Bariatric surgery., surgeon is at [facility] surgery.  Diabetes II  [name] is taking Trulicity and Xigduo, Benicar, Lipitor, and ASA. She sees Dr. Chalmers Cater this week.  Vitamin D Deficiency [name] is taking prescription Vit D.  At risk for osteopenia and osteoporosis [name] is at higher risk of osteopenia and osteoporosis due to vitamin D deficiency.   History of Lap Band [name] has a history of lap band surgery.  History of Myocardial Infarction [name] has a history of myocardial infarction. She is status post coronary artery bypass graft 5 times. She denies chest pain, edema, or shortness of breath.  Today's visit was # 20  Starting weight: 260 lbs Starting date: 06/08/18 Today's weight : 246 lbs Today's date: 08/29/2019 Total lbs lost to date: 14 Total lbs lost since last in-office visit: 2  ASSESSMENT AND PLAN:  Type 2 diabetes mellitus without complication, without long-term current use of insulin (HCC)  Vitamin D deficiency - Plan: Vitamin D, Ergocalciferol, [name]  History of gastric surgery  History of ST elevation myocardial infarction  At risk for osteoporosis  Class 3 severe obesity with serious comorbidity and body mass index (BMI) of 40.0 to 44.9 in adult,  unspecified obesity type ([place])  PLAN:  Diabetes II [name] has been given diabetes education by myself today. Good blood sugar control is important to decrease the likelihood of diabetic complications such as nephropathy, neuropathy, limb loss, blindness, coronary artery disease, and death. Intensive lifestyle modification including diet, exercise and weight loss were discussed as the first line treatment for diabetes. Hae will continue her medications and she will bring labs to her next visit.  Vitamin D Deficiency Low vitamin D level contributes to fatigue and are associated with obesity, breast, and colon cancer. [name] agrees to continue taking prescription Vit D 50,000 IU every 14 days #2 and we will refill for 1 month. She will follow up for routine testing of vitamin D, at least 2-3 times per year to avoid over-replacement. [name] agrees to follow up with our clinic in 2 weeks.  At risk for osteopenia and osteoporosis [name] was given extended (15 minutes) osteoporosis prevention counseling today. [name] is at risk for osteopenia and osteoporsis due to her vitamin D deficiency. She was encouraged to take her vitamin D and follow her higher calcium diet and increase strengthening exercise to help strengthen her bones and decrease her risk of osteopenia and osteoporosis.  History of Lap Band We will refer [name] back to bariatric surgeon and will follow up.  History of Myocardial Infarction [name] will continue her current treatment and will follow up.  Obesity [name] is currently in the action stage of change. As such, her goal is to continue with weight loss efforts She has agreed to follow  the Category 2 plan [name] has been instructed to work up to a goal of 150 minutes of combined cardio and strengthening exercise per week for weight loss and overall health benefits. We discussed the following Behavioral Modification Strategies today: increasing lean protein intake,  decreasing simple carbohydrates, increasing vegetables, increase H20 intake, work on meal planning and easy cooking plans and holiday eating strategies.  We will refer to [facility] (to check patient's lap band, as it has not been checked in years).  [name] has agreed to follow up with our clinic in 2 weeks. She was informed of the importance of frequent follow up visits to maximize her success with intensive lifestyle modifications for her multiple health conditions.  ALLERGIES: No Known Allergies  MEDICATIONS: Current Outpatient Medications on File Prior to Visit  Medication Sig Dispense Refill  . acetaminophen (TYLENOL) 500 MG tablet Take 500 mg by mouth every 6 (six) hours as needed for mild pain or headache.    Marland Kitchen aspirin EC 81 MG EC tablet Take 1 tablet (81 mg total) daily by mouth.    Marland Kitchen atorvastatin (LIPITOR) 40 MG tablet Take 1 tablet (40 mg total) by mouth daily. 90 tablet 3  . clopidogrel (PLAVIX) 75 MG tablet Take 1 tablet (75 mg total) by mouth daily. 90 tablet 2  . Coenzyme Q10 (COQ10) 100 MG CAPS Take 100 mg by mouth daily.    . colchicine 0.6 MG tablet Take two (2) tablets by mouth at time of initial pain. Then take one (1) tablet by mouth twice daily as needed for gout.    . Dapagliflozin-metFORMIN HCl ER (XIGDUO XR) 06-999 MG TB24 Take 10-1,000 capsules by mouth.    . Dulaglutide (TRULICITY) 1.5 0000000 SOPN Inject 1 Dose into the skin once a week.    . metoprolol tartrate (LOPRESSOR) 25 MG tablet TAKE 1 TABLET(25 MG) BY MOUTH TWICE DAILY 180 tablet 2  . Misc Natural Products (DAILY HERBS BONE/JOINTS PO) Take 3-5 tablets daily by mouth. Insta flex    . olmesartan (BENICAR) 5 MG tablet TAKE 1 TABLET(5 MG) BY MOUTH DAILY 90 tablet 2   No current facility-administered medications on file prior to visit.   PAST MEDICAL HISTORY: Past Medical History:  Diagnosis Date  . Anxiety   . Back pain   . Chest pain   . Coronary artery disease   . Diabetes mellitus     . Difficulty swallowing pills   . Fatigue   . GERD (gastroesophageal reflux disease)   . Hay fever   . Headache   . Heartburn   . History of heart attack   . HTN (hypertension)   . Hyperlipidemia   . Joint pain   . Knee pain   . Nervousness   . Palpitations   . Shortness of breath   . Shortness of breath on exertion   . Stress    PAST SURGICAL HISTORY: Past Surgical History:  Procedure Laterality Date  . CORONARY ARTERY BYPASS GRAFT N/A 08/02/2017   Procedure: CORONARY ARTERY BYPASS GRAFTING (CABG) X 5 (LIMA to LAD, LEFT RADIAL ARTERY to DIAGONAL, SVG to SEQUENTIALLY PDA and PLB) , USING LEFT INTERNAL MAMMARY ARTERY, LEFT RADIAL ARTERY, AND  SAPHENOUS VEIN to [place], RIGHT GREATER SAPHENOUS VEIN HARVESTED ENDOSCOPICALLY;  Surgeon: Grace Isaac, MD;  Location: [place];  Service: Open Heart Surgery;  Laterality: N/A;  . CORONARY/GRAFT ACUTE MI REVASCULARIZATION N/A 07/24/2017   Procedure: Coronary/Graft Acute MI Revascularization;  Surgeon: Belva Crome, MD;  Location:  [place] [facility];  Service: Cardiovascular;  Laterality: N/A;  . LAPAROSCOPIC GASTRIC BANDING  09/02/2008  . LEFT HEART CATH AND CORONARY ANGIOGRAPHY N/A 07/24/2017   Procedure: LEFT HEART CATH AND CORONARY ANGIOGRAPHY;  Surgeon: Belva Crome, MD;  Location: [place] [facility];  Service: Cardiovascular;  Laterality: N/A;  . RADIAL ARTERY HARVEST Left 08/02/2017   Procedure: LEFT RADIAL ARTERY HARVEST;  Surgeon: Grace Isaac, MD;  Location: [place];  Service: Open Heart Surgery;  Laterality: Left;  . STERNAL CLOSURE N/A 08/02/2017   Procedure: STERNAL PLATING;  Surgeon: Grace Isaac, MD;  Location: [place];  Service: Open Heart Surgery;  Laterality: N/A;  . [place]   had metal removed from stomach as a child  . TEE WITHOUT CARDIOVERSION N/A 08/02/2017   Procedure: TRANSESOPHAGEAL ECHOCARDIOGRAM (TEE);  Surgeon: Grace Isaac, MD;  Location: [place];  Service: Open Heart Surgery;   Laterality: N/A;   SOCIAL HISTORY: Social History   Tobacco Use  . Smoking status: Never Smoker  . Smokeless tobacco: Never Used  Substance Use Topics  . Alcohol use: Yes    Comment: occ  . Drug use: No   FAMILY HISTORY: Family History  Problem Relation Age of Onset  . Diabetes Father   . Hypertension Father   . Heart disease Father   . Sudden death Father   . Hyperlipidemia Mother   . Hypertension Mother   . Obesity Mother   . Cancer Maternal Grandmother        lung  . Cancer Maternal Grandfather        prostate   ROS: Review of Systems  Constitutional: Positive for weight loss.  Respiratory: Negative for shortness of breath.   Cardiovascular: Negative for chest pain.       Negative edema   PHYSICAL EXAM: Blood pressure 110/80, pulse 92, temperature 97.9 F (36.6 C), temperature source Oral, height 5\' 3"  (1.6 m), weight 246 lb (111.6 kg), SpO2 100 %. Body mass index is 43.58 kg/m.   Physical Exam Vitals reviewed.  Constitutional:      Appearance: Normal appearance. She is obese.  Cardiovascular:     Rate and Rhythm: Normal rate.     Pulses: Normal pulses.  Pulmonary:     Effort: Pulmonary effort is normal.     Breath sounds: Normal breath sounds.  Musculoskeletal:        General: Normal range of motion.  Skin:    General: Skin is warm and dry.  Neurological:     Mental Status: She is alert and oriented to person, place, and time.  Psychiatric:        Mood and Affect: Mood normal.        Behavior: Behavior normal.     RECENT LABS AND TESTS: BMET    Component Value Date/Time   NA 139 03/21/2019 1529   K 4.4 03/21/2019 1529   CL 103 03/21/2019 1529   CO2 17 (L) 03/21/2019 1529   GLUCOSE 87 03/21/2019 1529   GLUCOSE 150 (H) 08/08/2017 0348   BUN 10 03/21/2019 1529   CREATININE 0.84 03/21/2019 1529   CALCIUM 8.7 03/21/2019 1529   GFRNONAA 88 03/21/2019 1529   GFRAA 102 03/21/2019 1529   Lab Results  Component Value Date   HGBA1C 6.2 (H)  10/19/2018   HGBA1C 6.2 (H) 06/12/2018   HGBA1C 7.1 (H) 08/01/2017   HGBA1C 7.4 (H) 07/25/2017   HGBA1C 7.4 (H) 07/24/2017   Lab Results  Component Value Date   INSULIN 12.3 10/19/2018   INSULIN 16.3 06/12/2018   CBC    Component Value Date/Time   WBC 6.8 09/14/2017 0949   WBC 9.6 08/08/2017 0348   RBC 5.08 09/14/2017 0949   RBC 2.89 (L) 08/08/2017 0348   HGB 13.3 09/14/2017 0949   HCT 39.9 09/14/2017 0949   PLT 320 09/14/2017 0949   MCV 79 09/14/2017 0949   MCH 26.2 (L) 09/14/2017 0949   MCH 27.3 08/08/2017 0348   MCHC 33.3 09/14/2017 0949   MCHC 33.3 08/08/2017 0348   RDW 13.8 09/14/2017 0949   LYMPHSABS 1.8 07/24/2017 0518   MONOABS 0.7 07/24/2017 0518   EOSABS 0.1 07/24/2017 0518   BASOSABS 0.0 07/24/2017 0518   Iron/TIBC/Ferritin/ %Sat No results found for: IRON, TIBC, FERRITIN, IRONPCTSAT Lipid Panel     Component Value Date/Time   CHOL 111 03/21/2019 1529   TRIG 70 03/21/2019 1529   HDL 52 03/21/2019 1529   CHOLHDL 2.7 03/16/2018 0936   CHOLHDL 3.6 07/26/2017 0250   VLDL 25 07/26/2017 0250   LDLCALC 45 03/21/2019 1529   Hepatic Function Panel     Component Value Date/Time   PROT 6.4 03/21/2019 1529   ALBUMIN 4.0 03/21/2019 1529   AST 16 03/21/2019 1529   ALT 13 03/21/2019 1529   ALKPHOS 80 03/21/2019 1529   BILITOT 0.4 03/21/2019 1529   BILIDIR 0.14 03/16/2018 0936      Component Value Date/Time   TSH 1.560 06/12/2018 0808   I, Trixie Dredge, am acting as transcriptionist for Briscoe Deutscher, DO  I have reviewed the above documentation for accuracy and completeness, and I agree with the above. Briscoe Deutscher, DO

## 2019-09-03 ENCOUNTER — Encounter (INDEPENDENT_AMBULATORY_CARE_PROVIDER_SITE_OTHER): Payer: Self-pay | Admitting: Family Medicine

## 2019-09-03 DIAGNOSIS — Z9889 Other specified postprocedural states: Secondary | ICD-10-CM | POA: Insufficient documentation

## 2019-09-03 DIAGNOSIS — E119 Type 2 diabetes mellitus without complications: Secondary | ICD-10-CM | POA: Insufficient documentation

## 2019-09-04 ENCOUNTER — Encounter (INDEPENDENT_AMBULATORY_CARE_PROVIDER_SITE_OTHER): Payer: Self-pay

## 2019-09-11 ENCOUNTER — Ambulatory Visit (INDEPENDENT_AMBULATORY_CARE_PROVIDER_SITE_OTHER): Payer: BC Managed Care – PPO | Admitting: Bariatrics

## 2019-09-11 ENCOUNTER — Encounter (INDEPENDENT_AMBULATORY_CARE_PROVIDER_SITE_OTHER): Payer: Self-pay | Admitting: Bariatrics

## 2019-09-11 ENCOUNTER — Other Ambulatory Visit: Payer: Self-pay | Admitting: *Deleted

## 2019-09-11 ENCOUNTER — Other Ambulatory Visit: Payer: Self-pay

## 2019-09-11 VITALS — BP 121/83 | HR 78 | Temp 98.0°F | Ht 63.0 in | Wt 245.0 lb

## 2019-09-11 DIAGNOSIS — E559 Vitamin D deficiency, unspecified: Secondary | ICD-10-CM | POA: Diagnosis not present

## 2019-09-11 DIAGNOSIS — E119 Type 2 diabetes mellitus without complications: Secondary | ICD-10-CM

## 2019-09-11 DIAGNOSIS — E7849 Other hyperlipidemia: Secondary | ICD-10-CM

## 2019-09-11 DIAGNOSIS — Z6841 Body Mass Index (BMI) 40.0 and over, adult: Secondary | ICD-10-CM

## 2019-09-11 DIAGNOSIS — Z9189 Other specified personal risk factors, not elsewhere classified: Secondary | ICD-10-CM | POA: Diagnosis not present

## 2019-09-11 MED ORDER — VITAMIN D (ERGOCALCIFEROL) 1.25 MG (50000 UNIT) PO CAPS: 50000.0000 [IU] | ORAL_CAPSULE | ORAL | 0 refills | Status: DC

## 2019-09-11 MED ORDER — OLMESARTAN MEDOXOMIL 5 MG PO TABS: ORAL_TABLET | ORAL | 0 refills | Status: DC

## 2019-09-11 NOTE — Progress Notes (Signed)
Office: [phone]  /  Fax: [phone]   HPI:  Chief Complaint: OBESITY [name] is here to discuss her progress with her obesity treatment plan. She is on the Category 2 plan and states she is following her eating plan approximately 65% of the time. She states she is exercising 0 minutes 0 times per week.  [name] is down 1 lb. She was given the phone number for [facility] 08/25/2019. She reports doing better with her water intake.  Today's visit was #21 Starting weight: 260 lbs Starting date: 06/08/2018 Today's weight: 245 lbs Today's date: 09/11/2019 Total lbs lost to date: 15 Total lbs lost since last in-office visit: 1  Vitamin D deficiency [name] has a diagnosis of Vitamin D deficiency and is on prescription Vitamin D. Last Vitamin D 47.1 on 03/21/2019. No nausea, vomiting, or muscle weakness.  At risk for osteopenia and osteoporosis [name] is at higher risk of osteopenia and osteoporosis due to Vitamin D deficiency.   Type II Diabetes Mellitus [name] has type II diabetes mellitus. Last A1c was 6.2 on 10/19/2018, now 6.0 on 09/07/2019, with an insulin of 12.3.  Hyperlipidemia Associated with Diabetes Mellitus Type II [name] has hyperlipidemia associated with diabetes mellitus type II. Labs show total cholesterol 122, LDL 56, and HDL 50.  ASSESSMENT AND PLAN:  Vitamin D deficiency - Plan: Vitamin D, Ergocalciferol, [name]  Type 2 diabetes mellitus without complication, without long-term current use of insulin (HCC)  Other hyperlipidemia  At risk for osteoporosis  Class 3 severe obesity with serious comorbidity and body mass index (BMI) of 40.0 to 44.9 in adult, unspecified obesity type ([place])  PLAN:  Vitamin D Deficiency [name] was informed that low Vitamin D levels contributes to fatigue and are associated with obesity, breast, and colon cancer. She agrees to continue to take prescription Vit D @ 50,000 IU  every 14 days #2 with 0 refills and will follow-up for routine testing of Vitamin D, at least 2-3 times per year. She was informed of the risk of over-replacement of Vitamin D and agrees to not increase her dose unless she discusses this with [name] first. [name] agrees to follow-up with our clinic in 2-3 weeks.  At risk for osteopenia and osteoporosis [name] was given extended  (15 minutes) osteoporosis prevention counseling today. [name] is at risk for osteopenia and osteoporosis due to her Vitamin D deficiency. She was encouraged to take her Vitamin D and follow her higher calcium diet and increase strengthening exercise to help strengthen her bones and decrease her risk of osteopenia and osteoporosis.  Diabetes II [name] has been given diabetes education by myself today. Good blood sugar control is important to decrease the likelihood of diabetic complications such as nephropathy, neuropathy, limb loss, blindness, coronary artery disease, and death. Intensive lifestyle modification including diet, exercise and weight loss were discussed as the first line treatment for diabetes. [name] will decrease carbohydrates, increase healthy fats and protein.  Hyperlipidemia Associated with Diabetes Mellitus Type II [name] will continue her medications and decrease saturated fats.  Obesity [name] is currently in the action stage of change. As such, her goal is to continue with weight loss efforts. She has agreed to follow the Category 2 plan. [name] will work on meal planning, intentional eating, will be more adherent to the plan, will increase her water intake and increase her intake of raw vegetables. [name] has been instructed to exercise with 10-15 minute U-tube videos, dancing, "walk away the pounds"  for weight loss and overall health benefits. We discussed the following Behavioral Modification Strategies today: increasing lean protein intake, decreasing simple carbohydrates, increasing  vegetables, increase H20 intake, decrease eating out, no skipping meals, work on meal planning and easy cooking plans, keeping healthy foods in the home, and planning for success.  [name] has agreed to follow-up with our clinic in 2-3 weeks. She was informed of the importance of frequent follow-up visits to maximize her success with intensive lifestyle modifications for her multiple health conditions.  ALLERGIES: No Known Allergies  MEDICATIONS: Current Outpatient Medications on File Prior to Visit  Medication Sig Dispense Refill  . acetaminophen (TYLENOL) 500 MG tablet Take 500 mg by mouth every 6 (six) hours as needed for mild pain or headache.    Marland Kitchen aspirin EC 81 MG EC tablet Take 1 tablet (81 mg total) daily by mouth.    Marland Kitchen atorvastatin (LIPITOR) 40 MG tablet Take 1 tablet (40 mg total) by mouth daily. 90 tablet 3  . clopidogrel (PLAVIX) 75 MG tablet Take 1 tablet (75 mg total) by mouth daily. 90 tablet 2  . Coenzyme Q10 (COQ10) 100 MG CAPS Take 100 mg by mouth daily.    . colchicine 0.6 MG tablet Take two (2) tablets by mouth at time of initial pain. Then take one (1) tablet by mouth twice daily as needed for gout.    . Dapagliflozin-metFORMIN HCl ER (XIGDUO XR) 06-999 MG TB24 Take 10-1,000 capsules by mouth.    . Dulaglutide (TRULICITY) 1.5 0000000 SOPN Inject 1 Dose into the skin once a week.    . metoprolol tartrate (LOPRESSOR) 25 MG tablet TAKE 1 TABLET(25 MG) BY MOUTH TWICE DAILY 180 tablet 2  . Misc Natural Products (DAILY HERBS BONE/JOINTS PO) Take 3-5 tablets daily by mouth. Insta flex     No current facility-administered medications on file prior to visit.    PAST MEDICAL HISTORY: Past Medical History:  Diagnosis Date  . Anxiety   . Back pain   . Chest pain   . Coronary artery disease   . Diabetes mellitus   . Difficulty swallowing pills   . Fatigue   . GERD (gastroesophageal reflux disease)   . Hay fever   . Headache   . Heartburn   . History of heart attack   .  HTN (hypertension)   . Hyperlipidemia   . Joint pain   . Knee pain   . Nervousness   . Palpitations   . Shortness of breath   . Shortness of breath on exertion   . Stress     PAST SURGICAL HISTORY: Past Surgical History:  Procedure Laterality Date  . CORONARY ARTERY BYPASS GRAFT N/A 08/02/2017   Procedure: CORONARY ARTERY BYPASS GRAFTING (CABG) X 5 (LIMA to LAD, LEFT RADIAL ARTERY to DIAGONAL, SVG to SEQUENTIALLY PDA and PLB) , USING LEFT INTERNAL MAMMARY ARTERY, LEFT RADIAL ARTERY, AND  SAPHENOUS VEIN to [place], RIGHT GREATER SAPHENOUS VEIN HARVESTED ENDOSCOPICALLY;  Surgeon: Grace Isaac, MD;  Location: [place];  Service: Open Heart Surgery;  Laterality: N/A;  . CORONARY/GRAFT ACUTE MI REVASCULARIZATION N/A 07/24/2017   Procedure: Coronary/Graft Acute MI Revascularization;  Surgeon: Belva Crome, MD;  Location: [place] [facility];  Service: Cardiovascular;  Laterality: N/A;  . LAPAROSCOPIC GASTRIC BANDING  09/02/2008  . LEFT HEART CATH AND CORONARY ANGIOGRAPHY N/A 07/24/2017   Procedure: LEFT HEART CATH AND CORONARY ANGIOGRAPHY;  Surgeon: Belva Crome, MD;  Location: [place] [facility];  Service: Cardiovascular;  Laterality: N/A;  . RADIAL ARTERY HARVEST Left 08/02/2017   Procedure: LEFT RADIAL ARTERY HARVEST;  Surgeon: Grace Isaac, MD;  Location: [place];  Service: Open Heart Surgery;  Laterality: Left;  . STERNAL CLOSURE N/A 08/02/2017   Procedure: STERNAL PLATING;  Surgeon: Grace Isaac, MD;  Location: [place];  Service: Open Heart Surgery;  Laterality: N/A;  . [place]   had metal removed from stomach as a child  . TEE WITHOUT CARDIOVERSION N/A 08/02/2017   Procedure: TRANSESOPHAGEAL ECHOCARDIOGRAM (TEE);  Surgeon: Grace Isaac, MD;  Location: [place];  Service: Open Heart Surgery;  Laterality: N/A;    SOCIAL HISTORY: Social History   Tobacco Use  . Smoking status: Never Smoker  . Smokeless tobacco: Never Used  Substance Use Topics  .  Alcohol use: Yes    Comment: occ  . Drug use: No    FAMILY HISTORY: Family History  Problem Relation Age of Onset  . Diabetes Father   . Hypertension Father   . Heart disease Father   . Sudden death Father   . Hyperlipidemia Mother   . Hypertension Mother   . Obesity Mother   . Cancer Maternal Grandmother        lung  . Cancer Maternal Grandfather        prostate   ROS: Review of Systems  Constitutional: Positive for weight loss.  Gastrointestinal: Negative for nausea and vomiting.  Musculoskeletal:       Negative for muscle weakness.   PHYSICAL EXAM: Blood pressure 121/83, pulse 78, temperature 98 F (36.7 C), height 5\' 3"  (1.6 m), weight 245 lb (111.1 kg), last menstrual period 08/27/2019, SpO2 99 %. Body mass index is 43.4 kg/m. Physical Exam Vitals reviewed.  Constitutional:      Appearance: Normal appearance. She is obese.  Cardiovascular:     Rate and Rhythm: Normal rate.     Pulses: Normal pulses.  Pulmonary:     Effort: Pulmonary effort is normal.     Breath sounds: Normal breath sounds.  Musculoskeletal:        General: Normal range of motion.  Skin:    General: Skin is warm and dry.  Neurological:     Mental Status: She is alert and oriented to person, place, and time.  Psychiatric:        Behavior: Behavior normal.   RECENT LABS AND TESTS: BMET    Component Value Date/Time   NA 139 03/21/2019 1529   K 4.4 03/21/2019 1529   CL 103 03/21/2019 1529   CO2 17 (L) 03/21/2019 1529   GLUCOSE 87 03/21/2019 1529   GLUCOSE 150 (H) 08/08/2017 0348   BUN 10 03/21/2019 1529   CREATININE 0.84 03/21/2019 1529   CALCIUM 8.7 03/21/2019 1529   GFRNONAA 88 03/21/2019 1529   GFRAA 102 03/21/2019 1529   Lab Results  Component Value Date   HGBA1C 6.2 (H) 10/19/2018   HGBA1C 6.2 (H) 06/12/2018   HGBA1C 7.1 (H) 08/01/2017   HGBA1C 7.4 (H) 07/25/2017   HGBA1C 7.4 (H) 07/24/2017   Lab Results  Component Value Date   INSULIN 12.3 10/19/2018   INSULIN 16.3  06/12/2018   CBC    Component Value Date/Time   WBC 6.8 09/14/2017 0949   WBC 9.6 08/08/2017 0348   RBC 5.08 09/14/2017 0949   RBC 2.89 (L) 08/08/2017 0348   HGB 13.3 09/14/2017 0949   HCT 39.9 09/14/2017 0949   PLT 320 09/14/2017 0949   MCV  79 09/14/2017 0949   MCH 26.2 (L) 09/14/2017 0949   MCH 27.3 08/08/2017 0348   MCHC 33.3 09/14/2017 0949   MCHC 33.3 08/08/2017 0348   RDW 13.8 09/14/2017 0949   LYMPHSABS 1.8 07/24/2017 0518   MONOABS 0.7 07/24/2017 0518   EOSABS 0.1 07/24/2017 0518   BASOSABS 0.0 07/24/2017 0518   Iron/TIBC/Ferritin/ %Sat No results found for: IRON, TIBC, FERRITIN, IRONPCTSAT Lipid Panel     Component Value Date/Time   CHOL 111 03/21/2019 1529   TRIG 70 03/21/2019 1529   HDL 52 03/21/2019 1529   CHOLHDL 2.7 03/16/2018 0936   CHOLHDL 3.6 07/26/2017 0250   VLDL 25 07/26/2017 0250   LDLCALC 45 03/21/2019 1529   Hepatic Function Panel     Component Value Date/Time   PROT 6.4 03/21/2019 1529   ALBUMIN 4.0 03/21/2019 1529   AST 16 03/21/2019 1529   ALT 13 03/21/2019 1529   ALKPHOS 80 03/21/2019 1529   BILITOT 0.4 03/21/2019 1529   BILIDIR 0.14 03/16/2018 0936      Component Value Date/Time   TSH 1.560 06/12/2018 0808    OBESITY BEHAVIORAL INTERVENTION VISIT DOCUMENTATION FOR INSURANCE (~15 minutes)  I, Michaelene Song, am acting as Location manager for [employer], DO   I have reviewed the above documentation for accuracy and completeness, and I agree with the above. Jearld Lesch, DO

## 2019-09-12 ENCOUNTER — Encounter (INDEPENDENT_AMBULATORY_CARE_PROVIDER_SITE_OTHER): Payer: Self-pay | Admitting: Bariatrics

## 2019-09-24 ENCOUNTER — Encounter (INDEPENDENT_AMBULATORY_CARE_PROVIDER_SITE_OTHER): Payer: Self-pay | Admitting: Bariatrics

## 2019-09-24 ENCOUNTER — Other Ambulatory Visit: Payer: BC Managed Care – PPO

## 2019-10-08 ENCOUNTER — Encounter (INDEPENDENT_AMBULATORY_CARE_PROVIDER_SITE_OTHER): Payer: Self-pay | Admitting: Bariatrics

## 2019-10-08 ENCOUNTER — Ambulatory Visit (INDEPENDENT_AMBULATORY_CARE_PROVIDER_SITE_OTHER): Payer: BC Managed Care – PPO | Admitting: Bariatrics

## 2019-10-08 ENCOUNTER — Other Ambulatory Visit: Payer: Self-pay

## 2019-10-08 VITALS — BP 128/88 | HR 81 | Temp 98.1°F | Ht 63.0 in | Wt 247.0 lb

## 2019-10-08 DIAGNOSIS — I10 Essential (primary) hypertension: Secondary | ICD-10-CM

## 2019-10-08 DIAGNOSIS — E559 Vitamin D deficiency, unspecified: Secondary | ICD-10-CM | POA: Diagnosis not present

## 2019-10-08 DIAGNOSIS — E1169 Type 2 diabetes mellitus with other specified complication: Secondary | ICD-10-CM

## 2019-10-08 DIAGNOSIS — Z6841 Body Mass Index (BMI) 40.0 and over, adult: Secondary | ICD-10-CM

## 2019-10-08 MED ORDER — VITAMIN D (ERGOCALCIFEROL) 1.25 MG (50000 UNIT) PO CAPS: 50000.0000 [IU] | ORAL_CAPSULE | ORAL | 0 refills | Status: DC

## 2019-10-08 NOTE — Progress Notes (Signed)
Chief Complaint:   OBESITY [name] is here to discuss her progress with her obesity treatment plan along with follow-up of her obesity related diagnoses. [name] is on the Category 2 Plan and states she is following her eating plan approximately 85% of the time. [name] states she is exercising 0 minutes 0 times per week.  Today's visit was #: 22 Starting weight: 260 lbs Starting date: 06/08/2018 Today's weight: 247 lbs Today's date: 10/08/2019 Total lbs lost to date: 13 Total lbs lost since last in-office visit: 0  Interim History: [name] is up 2 lbs, but doing okay overall. She has struggled over the holidays. She states she did not get enough water last week.  Subjective:   Essential hypertension. This is reasonably well controlled. Systolic pressure slightly elevated with blood pressure 128/88.  BP Readings from Last 3 Encounters:  10/08/19 128/88  09/11/19 121/83  08/29/19 110/80   Lab Results  Component Value Date   CREATININE 0.84 03/21/2019   CREATININE 0.88 10/19/2018   CREATININE 0.89 06/12/2018    Type 2 diabetes mellitus with other specified complication, without long-term current use of insulin ([name]). [name] is taking Trulicity.  Lab Results  Component Value Date   HGBA1C 6.2 (H) 10/19/2018   HGBA1C 6.2 (H) 06/12/2018   HGBA1C 7.1 (H) 08/01/2017   Lab Results  Component Value Date   LDLCALC 45 03/21/2019   CREATININE 0.84 03/21/2019    Vitamin D deficiency. No nausea, vomiting, or muscle weakness.  Assessment/Plan:   Essential hypertension. [name] is working on healthy weight loss and exercise to improve blood pressure control. We will watch for signs of hypotension as she continues her lifestyle modifications. She will continue her medications, decrease salt in her diet, decrease canned foods, and decrease takeout.  Type 2 diabetes mellitus with other specified complication, without long-term current use of insulin ([name]). Good blood  sugar control is important to decrease the likelihood of diabetic complications such as nephropathy, neuropathy, limb loss, blindness, coronary artery disease, and death. Intensive lifestyle modification including diet, exercise and weight loss are the first line of treatment for diabetes. [name] will continue her medications, decrease carbohydrates, and increase healthy fats and protein.  Vitamin D deficiency. Low Vitamin D level contributes to fatigue and are associated with obesity, breast, and colon cancer. She agrees to take prescription Vitamin D, Ergocalciferol, (DRISDOL) 1.25 MG (50000 UNIT) CAPS capsule every 14 days #2 with 0 refills and will follow-up for routine testing of Vitamin D, at least 2-3 times per year to avoid over-replacement.   Class 3 severe obesity with serious comorbidity and body mass index (BMI) of 40.0 to 44.9 in adult, unspecified obesity type ([name]).  [name] is currently in the action stage of change. As such, her goal is to continue with weight loss efforts. She has agreed to the Category 2 Plan.   She will work on meal planning, intentional eating, and will increase her water intake.  Exercise goals: [name] will ponder exercise.  Behavioral modification strategies: increasing lean protein intake, decreasing simple carbohydrates, increasing vegetables, increasing water intake, decreasing eating out, no skipping meals, meal planning and cooking strategies and keeping healthy foods in the home.  [name] has agreed to follow-up with our clinic in 2 weeks. She was informed of the importance of frequent follow-up visits to maximize her success with intensive lifestyle modifications for her multiple health conditions.   Objective:   Blood pressure 128/88, pulse 81, temperature 98.1 F (36.7 C), height  5\' 3"  (1.6 m), weight 247 lb (112 kg), last menstrual period 09/29/2019, SpO2 99 %. Body mass index is 43.75 kg/m.  General: Cooperative, alert, well developed, in no  acute distress. HEENT: Conjunctivae and lids unremarkable. Cardiovascular: Regular rhythm.  Lungs: Normal work of breathing. Neurologic: No focal deficits.   Lab Results  Component Value Date   CREATININE 0.84 03/21/2019   BUN 10 03/21/2019   NA 139 03/21/2019   K 4.4 03/21/2019   CL 103 03/21/2019   CO2 17 (L) 03/21/2019   Lab Results  Component Value Date   ALT 13 03/21/2019   AST 16 03/21/2019   ALKPHOS 80 03/21/2019   BILITOT 0.4 03/21/2019   Lab Results  Component Value Date   HGBA1C 6.2 (H) 10/19/2018   HGBA1C 6.2 (H) 06/12/2018   HGBA1C 7.1 (H) 08/01/2017   HGBA1C 7.4 (H) 07/25/2017   HGBA1C 7.4 (H) 07/24/2017   Lab Results  Component Value Date   INSULIN 12.3 10/19/2018   INSULIN 16.3 06/12/2018   Lab Results  Component Value Date   TSH 1.560 06/12/2018   Lab Results  Component Value Date   CHOL 111 03/21/2019   HDL 52 03/21/2019   LDLCALC 45 03/21/2019   TRIG 70 03/21/2019   CHOLHDL 2.7 03/16/2018   Lab Results  Component Value Date   WBC 6.8 09/14/2017   HGB 13.3 09/14/2017   HCT 39.9 09/14/2017   MCV 79 09/14/2017   PLT 320 09/14/2017   No results found for: IRON, TIBC, FERRITIN  Attestation Statements:   Reviewed by clinician on day of visit: allergies, medications, problem list, medical history, surgical history, family history, social history, and previous encounter notes.  Migdalia Dk, am acting as Location manager for [employer], DO   I have reviewed the above documentation for accuracy and completeness, and I agree with the above. Jearld Lesch, DO

## 2019-10-09 ENCOUNTER — Encounter (INDEPENDENT_AMBULATORY_CARE_PROVIDER_SITE_OTHER): Payer: Self-pay | Admitting: Bariatrics

## 2019-10-30 ENCOUNTER — Ambulatory Visit (INDEPENDENT_AMBULATORY_CARE_PROVIDER_SITE_OTHER): Payer: BC Managed Care – PPO | Admitting: Bariatrics

## 2019-11-16 ENCOUNTER — Other Ambulatory Visit: Payer: Self-pay

## 2019-11-16 MED ORDER — CLOPIDOGREL BISULFATE 75 MG PO TABS: 75.0000 mg | ORAL_TABLET | Freq: Every day | ORAL | 0 refills | Status: DC

## 2019-12-30 NOTE — Progress Notes (Signed)
Cardiology Office Note:    Date:  12/31/2019   ID:  ELIVIA Romero, DOB 11/07/1980, MRN [id number]  PCP:  Donald Prose, MD  Cardiologist:  Sinclair Grooms, MD   Referring MD: Donald Prose, MD   Chief Complaint  Patient presents with  . Coronary Artery Disease    History of Present Illness:    [name] is a 39 y.o. female with a hx of DMtype II with elevated A1c, obesity status post lap band procedure in 2009, coronary artery disease status post inferior STEMI followed by CABG(LIMA to LAD, left radial to diagonal, SVG sequential to PDA and PL) in November 2018.  [name] is doing well.  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  She has not gotten COVID-19 vaccination.  She states that it may upset her husband if she did.  She has multiple risk factors for severe disease including obesity, diabetes, hypertension, and will be at increased risk for death.  She has no anginal complaints.  She is not getting 150 minutes or more of moderate physical activity.  She denies orthopnea, PND, and palpitations.  She is compliant with her medication regimen.  Diabetes therapy is improving glycemic control.  Past Medical History:  Diagnosis Date  . Anxiety   . Back pain   . Chest pain   . Coronary artery disease   . Diabetes mellitus   . Difficulty swallowing pills   . Fatigue   . GERD (gastroesophageal reflux disease)   . Hay fever   . Headache   . Heartburn   . History of heart attack   . HTN (hypertension)   . Hyperlipidemia   . Joint pain   . Knee pain   . Nervousness   . Palpitations   . Shortness of breath   . Shortness of breath on exertion   . Stress     Past Surgical History:  Procedure Laterality Date  . CORONARY ARTERY BYPASS GRAFT N/A 08/02/2017   Procedure: CORONARY ARTERY BYPASS GRAFTING (CABG) X 5 (LIMA to LAD, LEFT RADIAL ARTERY to DIAGONAL, SVG to SEQUENTIALLY PDA and PLB) , USING LEFT INTERNAL MAMMARY ARTERY, LEFT RADIAL ARTERY,  AND  SAPHENOUS VEIN to [place], RIGHT GREATER SAPHENOUS VEIN HARVESTED ENDOSCOPICALLY;  Surgeon: Grace Isaac, MD;  Location: [facility];  Service: Open Heart Surgery;  Laterality: N/A;  . CORONARY/GRAFT ACUTE MI REVASCULARIZATION N/A 07/24/2017   Procedure: Coronary/Graft Acute MI Revascularization;  Surgeon: Belva Crome, MD;  Location: [facility];  Service: Cardiovascular;  Laterality: N/A;  . LAPAROSCOPIC GASTRIC BANDING  09/02/2008  . LEFT HEART CATH AND CORONARY ANGIOGRAPHY N/A 07/24/2017   Procedure: LEFT HEART CATH AND CORONARY ANGIOGRAPHY;  Surgeon: Belva Crome, MD;  Location: [facility];  Service: Cardiovascular;  Laterality: N/A;  . RADIAL ARTERY HARVEST Left 08/02/2017   Procedure: LEFT RADIAL ARTERY HARVEST;  Surgeon: Grace Isaac, MD;  Location: [facility];  Service: Open Heart Surgery;  Laterality: Left;  . STERNAL CLOSURE N/A 08/02/2017   Procedure: STERNAL PLATING;  Surgeon: Grace Isaac, MD;  Location: [facility];  Service: Open Heart Surgery;  Laterality: N/A;  . [place]   had metal removed from stomach as a child  . TEE WITHOUT CARDIOVERSION N/A 08/02/2017   Procedure: TRANSESOPHAGEAL ECHOCARDIOGRAM (TEE);  Surgeon: Grace Isaac, MD;  Location: [facility];  Service: Open Heart Surgery;  Laterality: N/A;    Current Medications: Current  Meds  Medication Sig  . acetaminophen (TYLENOL) 500 MG tablet Take 500 mg by mouth every 6 (six) hours as needed for mild pain or headache.  Marland Kitchen aspirin EC 81 MG EC tablet Take 1 tablet (81 mg total) daily by mouth.  Marland Kitchen atorvastatin (LIPITOR) 40 MG tablet Take 20 mg by mouth daily.  . clopidogrel (PLAVIX) 75 MG tablet Take 1 tablet (75 mg total) by mouth daily.  . Coenzyme Q10 (COQ10) 100 MG CAPS Take 100 mg by mouth daily.  . colchicine 0.6 MG tablet Take two (2) tablets by mouth at time of initial pain. Then take one (1) tablet by mouth twice daily as needed for gout.  . Dulaglutide (TRULICITY) 1.5  0000000 SOPN Inject 1 Dose into the skin once a week.  . metoprolol tartrate (LOPRESSOR) 25 MG tablet Take 25 mg by mouth daily.  . Misc Natural Products (DAILY HERBS BONE/JOINTS PO) Take 3-5 tablets daily by mouth. Insta flex  . olmesartan (BENICAR) 5 MG tablet TAKE 1 TABLET(5 MG) BY MOUTH DAILY  . Vitamin D, Ergocalciferol, (DRISDOL) 1.25 MG (50000 UNIT) CAPS capsule Take 1 capsule (50,000 Units total) by mouth every 14 (fourteen) days.     Allergies:   Patient has no known allergies.   Social History   Socioeconomic History  . Marital status: Significant Other    Spouse name: [name]  . Number of children: Not on file  . Years of education: Not on file  . Highest education level: Not on file  Occupational History  . Occupation: Dealer: [employer]  Tobacco Use  . Smoking status: Never Smoker  . Smokeless tobacco: Never Used  Substance and Sexual Activity  . Alcohol use: Yes    Comment: occ  . Drug use: No  . Sexual activity: Not on file  Other Topics Concern  . Not on file  Social History Narrative  . Not on file   Social Determinants of Health   Financial Resource Strain:   . Difficulty of Paying Living Expenses:   Food Insecurity:   . Worried About Charity fundraiser in the Last Year:   . Arboriculturist in the Last Year:   Transportation Needs:   . Film/video editor (Medical):   Marland Kitchen Lack of Transportation (Non-Medical):   Physical Activity:   . Days of Exercise per Week:   . Minutes of Exercise per Session:   Stress:   . Feeling of Stress :   Social Connections:   . Frequency of Communication with Friends and Family:   . Frequency of Social Gatherings with Friends and Family:   . Attends Religious Services:   . Active Member of Clubs or Organizations:   . Attends Archivist Meetings:   Marland Kitchen Marital Status:      Family History: The patient's family history includes Cancer in her maternal  grandfather and maternal grandmother; Diabetes in her father; Heart disease in her father; Hyperlipidemia in her mother; Hypertension in her father and mother; Obesity in her mother; Sudden death in her father.  ROS:   Please see the history of present illness.    Has anxiety about getting COVID-19.  She has to work to pay her bills.  She saw her weight loss physician.  All other systems reviewed and are negative.  EKGs/Labs/Other Studies Reviewed:    The following studies were reviewed today: No new imaging data  EKG:  EKG normal sinus rhythm with  small inferior Q waves.  This poor R wave progression.  Overall when compared to prior tracing, Q waves appear to be small in amplitude.  Recent Labs: 03/21/2019: ALT 13; BUN 10; Creatinine, Ser 0.84; Potassium 4.4; Sodium 139  Recent Lipid Panel    Component Value Date/Time   CHOL 111 03/21/2019 1529   TRIG 70 03/21/2019 1529   HDL 52 03/21/2019 1529   CHOLHDL 2.7 03/16/2018 0936   CHOLHDL 3.6 07/26/2017 0250   VLDL 25 07/26/2017 0250   LDLCALC 45 03/21/2019 1529    Physical Exam:    VS:  BP 128/86   Pulse 66   Ht 5\' 3"  (1.6 m)   Wt 258 lb 6.4 oz (117.2 kg)   SpO2 100%   BMI 45.77 kg/m     Wt Readings from Last 3 Encounters:  12/31/19 258 lb 6.4 oz (117.2 kg)  10/08/19 247 lb (112 kg)  09/11/19 245 lb (111.1 kg)     GEN: Obese but in no acute distress HEENT: Normal NECK: No JVD. LYMPHATICS: No lymphadenopathy CARDIAC: Keloid formation in the line of chest incision for coronary bypass graft surgery RRR without murmur, gallop, or edema. VASCULAR:  Normal Pulses. No bruits. RESPIRATORY:  Clear to auscultation without rales, wheezing or rhonchi  ABDOMEN: Soft, non-tender, non-distended, No pulsatile mass, MUSCULOSKELETAL: No deformity  SKIN: Warm and dry NEUROLOGIC:  Alert and oriented x 3 PSYCHIATRIC:  Normal affect   ASSESSMENT:    1. Coronary artery disease involving coronary bypass graft of native heart with angina  pectoris ([place])   2. Diabetes mellitus type 2 in obese ([place])   3. Morbid obesity ([place])   4. Essential hypertension   5. Pure hypercholesterolemia with target low density lipoprotein (LDL) cholesterol less than 70 mg/dL   6. Educated about COVID-19 virus infection    PLAN:    In order of problems listed above:  1. Secondary prevention discussed.  Encouraged increase in moderate aerobic activity to greater than 150 minutes/week.  Dr. Michiel Sites has decreased the intensity of statin therapy.  LDL needs to be kept less than 70. 2. Dulaglutide is being continued.  Metformin has been discontinued. 3. Continue decrease caloric intake and increase physical activity. 4. Blood pressure is excellent and she should continue Benicar and Lopressor. 5. Again LDL target should be less than 70.  Currently now on atorvastatin 20 mg/day.  Assume that Dr. Michiel Sites will repeat a lipid panel this year. 6. I encouraged her to get the COVID-19 vaccine.  Encourage social distancing, mask wearing, and waiting 6 feet when in social environment.  Overall education and awareness concerning primary/secondary risk prevention was discussed in detail: LDL less than 70, hemoglobin A1c less than 7, blood pressure target less than 130/80 mmHg, >150 minutes of moderate aerobic activity per week, avoidance of smoking, weight control (via diet and exercise), and continued surveillance/management of/for obstructive sleep apnea.    Medication Adjustments/Labs and Tests Ordered: Current medicines are reviewed at length with the patient today.  Concerns regarding medicines are outlined above.  Orders Placed This Encounter  Procedures  . EKG 12-Lead   No orders of the defined types were placed in this encounter.   Patient Instructions  Medication Instructions:  Your physician recommends that you continue on your current medications as directed. Please refer to the Current Medication list given to you today.  *If you need a refill  on your cardiac medications before your next appointment, please call your pharmacy*   Lab Work: None  If you have labs (blood work) drawn today and your tests are completely normal, you will receive your results only by: Marland Kitchen MyChart Message (if you have MyChart) OR . A paper copy in the mail If you have any lab test that is abnormal or we need to change your treatment, we will call you to review the results.   Testing/Procedures: None   Follow-Up: At [facility], you and your health needs are our priority.  As part of our continuing mission to provide you with exceptional heart care, we have created designated Provider Care Teams.  These Care Teams include your primary Cardiologist (physician) and Advanced Practice Providers (APPs -  Physician Assistants and Nurse Practitioners) who all work together to provide you with the care you need, when you need it.  We recommend signing up for the patient portal called "MyChart".  Sign up information is provided on this After Visit Summary.  MyChart is used to connect with patients for Virtual Visits (Telemedicine).  Patients are able to view lab/test results, encounter notes, upcoming appointments, etc.  Non-urgent messages can be sent to your provider as well.   To learn more about what you can do with MyChart, go to [email or web address].    Your next appointment:   12 month(s)  The format for your next appointment:   In Person  Provider:   You may see Sinclair Grooms, MD or one of the following Advanced Practice Providers on your designated Care Team:    Truitt Merle, NP  Cecilie Kicks, NP  Kathyrn Drown, NP    Other Instructions      Signed, Sinclair Grooms, MD  12/31/2019 5:28 PM    [place]

## 2019-12-31 ENCOUNTER — Ambulatory Visit: Payer: BC Managed Care – PPO | Admitting: Interventional Cardiology

## 2019-12-31 ENCOUNTER — Encounter: Payer: Self-pay | Admitting: Interventional Cardiology

## 2019-12-31 ENCOUNTER — Other Ambulatory Visit: Payer: Self-pay

## 2019-12-31 VITALS — BP 128/86 | HR 66 | Ht 63.0 in | Wt 258.4 lb

## 2019-12-31 DIAGNOSIS — I25709 Atherosclerosis of coronary artery bypass graft(s), unspecified, with unspecified angina pectoris: Secondary | ICD-10-CM

## 2019-12-31 DIAGNOSIS — I10 Essential (primary) hypertension: Secondary | ICD-10-CM | POA: Diagnosis not present

## 2019-12-31 DIAGNOSIS — Z7189 Other specified counseling: Secondary | ICD-10-CM

## 2019-12-31 DIAGNOSIS — E1169 Type 2 diabetes mellitus with other specified complication: Secondary | ICD-10-CM | POA: Diagnosis not present

## 2019-12-31 DIAGNOSIS — E669 Obesity, unspecified: Secondary | ICD-10-CM

## 2019-12-31 DIAGNOSIS — E7800 Pure hypercholesterolemia, unspecified: Secondary | ICD-10-CM

## 2019-12-31 NOTE — Patient Instructions (Signed)

## 2020-01-09 ENCOUNTER — Other Ambulatory Visit: Payer: Self-pay

## 2020-01-09 MED ORDER — OLMESARTAN MEDOXOMIL 5 MG PO TABS: ORAL_TABLET | ORAL | 3 refills | Status: DC

## 2020-01-17 ENCOUNTER — Encounter: Payer: Self-pay | Admitting: Skilled Nursing Facility1

## 2020-01-17 ENCOUNTER — Encounter: Payer: BC Managed Care – PPO | Attending: Surgery | Admitting: Skilled Nursing Facility1

## 2020-01-17 ENCOUNTER — Other Ambulatory Visit: Payer: Self-pay

## 2020-01-17 DIAGNOSIS — E669 Obesity, unspecified: Secondary | ICD-10-CM | POA: Insufficient documentation

## 2020-01-17 DIAGNOSIS — E1169 Type 2 diabetes mellitus with other specified complication: Secondary | ICD-10-CM | POA: Insufficient documentation

## 2020-01-17 NOTE — Progress Notes (Signed)
Bariatric Nutrition Follow-Up Visit Medical Nutrition Therapy    NUTRITION ASSESSMENT    Anthropometrics  Start weight at [facility]: 254.1 lbs (date: 01/17/2020) Today's weight: 254.1 lbs  Body Composition Scale 01/17/2020  Weight  lbs 254.1  Total Body Fat  % 45.4     Visceral Fat 15  Fat-Free Mass  % 54.5     Total Body Water  % 41.7     Muscle-Mass  lbs 32.1  BMI 44  Body Fat Displacement ---        Torso  lbs 71.6        Left Leg  lbs 14.3        Right Leg  lbs 14.3        Left Arm  lbs 7.1        Right Arm  lbs 7.1   Clinical  Medical hx: DM, GERD, HTN, gout Medications: trulicity Labs:    Lifestyle & Dietary Hx  Pt states she has not reflux or symptoms of the band malfunctioning.  Pt states she started taking a probiotic stating that helps her to have a daily bowl movement now. Pt states she has had more fluid in her pouch but does not feel the restriction. Pt states she did work with health weight and wellness on relationship with food. Pt states she will go buy foods if feeling anxious, overwhelmed or tired. Pt states when she was living with her mother. Pt states her husband makes pie.  Pts A1C 6.1  Estimated daily fluid intake: 40-80 oz Estimated daily protein intake: 60+ g Supplements:  Current average weekly physical activity: ADL's  24-Hr Dietary Recall First Meal: tea Snack: coffee + creamer + stevia  Second Meal 1: tuna pack with pita and 10 black olives and pepeoroini Snack: peppers and apples  Snack: popcorn chips Third Meal: salad Snack: [place] and cauliflower rice and vegetables atkins bar and pie Snack: pie  Beverages: water  Post-Op Goals/ Signs/ Symptoms Using straws: no Drinking while eating: no Chewing/swallowing difficulties: no Changes in vision: no Changes to mood/headaches: no Hair loss/changes to skin/nails: no Difficulty focusing/concentrating: no Sweating: no Dizziness/lightheadedness: no Palpitations: no   Carbonated/caffeinated beverages: no N/V/D/C/Gas: no Abdominal pain: no Dumping syndrome: no    NUTRITION DIAGNOSIS  Overweight/obesity (Joseph-3.3) related to past poor dietary habits and physical inactivity as evidenced by completed bariatric surgery and following dietary guidelines for continued weight loss and healthy nutrition status.     NUTRITION INTERVENTION Nutrition counseling (C-1) and education (E-2) to facilitate bariatric surgery goals, including: . The importance of consuming adequate calories as well as certain nutrients daily due to the body's need for essential vitamins, minerals, and fats . The importance of daily physical activity and to reach a goal of at least 150 minutes of moderate to vigorous physical activity weekly (or as directed by their physician) due to benefits such as increased musculature and improved lab values . The importance of intuitive eating specifically learning hunger-satiety cues and understanding the importance of learning a new body  Handouts Provided Include   Detailed myplate  Should I eat  Snack sheet  Goals: ? Try kefir in the yogurt aisle  ? Find some hobbies girl! ? Emotional Eating is a journey there will be ups and there will be downs  ? Minimum 64 fluid ounces per day ? Do not say anything  negative out loud about yourself ? Is food really bad? ? Eat every 3 hours starting within 1-1.5 hours of waking:  breakfast: lean protein + carb; lunch and dinner: lean protein + carb + non starchy vegetable  Learning Style & Readiness for Change Teaching method utilized: Visual & Auditory  Demonstrated degree of understanding via: Teach Back  Barriers to learning/adherence to lifestyle change: emotional eating  RD's Notes for Next Visit . Assess adherence to pt chosen goals   MONITORING & EVALUATION Dietary intake, weekly physical activity, body weight  Next Steps Patient is to follow-up

## 2020-02-13 ENCOUNTER — Ambulatory Visit: Payer: BC Managed Care – PPO | Admitting: Skilled Nursing Facility1

## 2020-02-19 ENCOUNTER — Other Ambulatory Visit: Payer: Self-pay

## 2020-02-19 MED ORDER — CLOPIDOGREL BISULFATE 75 MG PO TABS: 75.0000 mg | ORAL_TABLET | Freq: Every day | ORAL | 3 refills | Status: DC

## 2020-04-09 ENCOUNTER — Other Ambulatory Visit: Payer: Self-pay | Admitting: Interventional Cardiology

## 2020-04-09 ENCOUNTER — Other Ambulatory Visit: Payer: Self-pay

## 2020-04-09 MED ORDER — METOPROLOL TARTRATE 25 MG PO TABS: 25.0000 mg | ORAL_TABLET | Freq: Every day | ORAL | 1 refills | Status: DC

## 2020-05-12 ENCOUNTER — Other Ambulatory Visit: Payer: Self-pay

## 2020-05-12 MED ORDER — ATORVASTATIN CALCIUM 40 MG PO TABS: 20.0000 mg | ORAL_TABLET | Freq: Every day | ORAL | 2 refills | Status: DC

## 2020-10-08 ENCOUNTER — Other Ambulatory Visit: Payer: Self-pay | Admitting: Interventional Cardiology

## 2020-11-12 ENCOUNTER — Other Ambulatory Visit: Payer: Self-pay | Admitting: Interventional Cardiology

## 2021-01-01 ENCOUNTER — Ambulatory Visit: Payer: BC Managed Care – PPO | Admitting: Cardiology

## 2021-01-01 ENCOUNTER — Encounter: Payer: Self-pay | Admitting: Cardiology

## 2021-01-01 ENCOUNTER — Other Ambulatory Visit: Payer: Self-pay

## 2021-01-01 VITALS — BP 124/90 | HR 76 | Ht 64.0 in | Wt 259.8 lb

## 2021-01-01 DIAGNOSIS — E7800 Pure hypercholesterolemia, unspecified: Secondary | ICD-10-CM

## 2021-01-01 DIAGNOSIS — I10 Essential (primary) hypertension: Secondary | ICD-10-CM | POA: Diagnosis not present

## 2021-01-01 DIAGNOSIS — I2510 Atherosclerotic heart disease of native coronary artery without angina pectoris: Secondary | ICD-10-CM | POA: Diagnosis not present

## 2021-01-01 DIAGNOSIS — I25709 Atherosclerosis of coronary artery bypass graft(s), unspecified, with unspecified angina pectoris: Secondary | ICD-10-CM

## 2021-01-01 DIAGNOSIS — E1169 Type 2 diabetes mellitus with other specified complication: Secondary | ICD-10-CM | POA: Diagnosis not present

## 2021-01-01 DIAGNOSIS — E669 Obesity, unspecified: Secondary | ICD-10-CM

## 2021-01-01 MED ORDER — OLMESARTAN MEDOXOMIL 5 MG PO TABS: 5.0000 mg | ORAL_TABLET | Freq: Every day | ORAL | 3 refills | Status: DC

## 2021-01-01 MED ORDER — ATORVASTATIN CALCIUM 40 MG PO TABS: 20.0000 mg | ORAL_TABLET | Freq: Every day | ORAL | 3 refills | Status: DC

## 2021-01-01 MED ORDER — CLOPIDOGREL BISULFATE 75 MG PO TABS: 75.0000 mg | ORAL_TABLET | Freq: Every day | ORAL | 3 refills | Status: DC

## 2021-01-01 NOTE — Progress Notes (Signed)
Cardiology Office Note   Date:  01/02/2021   ID:  [name], DOB 1980/12/20, MRN [id number]  PCP:  Michael Boston, MD  Cardiologist:  Dr. Tamala Julian     Chief Complaint  Patient presents with  . Coronary Artery Disease      History of Present Illness: [name] is a 40 y.o. female who presents for CAD  DMtype II with elevated A1c, obesity status post lap band procedure in 2009, coronary artery disease status post inferior STEMI followed by CABG(LIMA to LAD, left radial to diagonal, SVG sequential to PDA and PL) in November 2018.  [name] is doing well.  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  She has not gotten COVID-19 vaccination.  She states that it may upset her husband if she did.  She has multiple risk factors for severe disease including obesity, diabetes, hypertension, and will be at increased risk for death.  Was stable on last visit 12/31/19.  Today no chest pain or SOB, not exercising but plans to begin.  She is not going to healthy wt and wellness currently.  We discussed wt loss she is a member of weight watchers and plans to return.   She is beginning an exercise program as well.   Tries to eat healthy. She asks about getting pregnant.  Her husband does not want her to at this weight - we discussed and she would be high risk with age alone.  Will ask Dr. Tamala Julian for his opinion.    Past Medical History:  Diagnosis Date  . Anxiety   . Back pain   . Chest pain   . Coronary artery disease   . Diabetes mellitus   . Difficulty swallowing pills   . Fatigue   . GERD (gastroesophageal reflux disease)   . Hay fever   . Headache   . Heartburn   . History of heart attack   . HTN (hypertension)   . Hyperlipidemia   . Joint pain   . Knee pain   . Nervousness   . Palpitations   . Shortness of breath   . Shortness of breath on exertion   . Stress     Past Surgical History:  Procedure Laterality Date  . CORONARY ARTERY BYPASS  GRAFT N/A 08/02/2017   Procedure: CORONARY ARTERY BYPASS GRAFTING (CABG) X 5 (LIMA to LAD, LEFT RADIAL ARTERY to DIAGONAL, SVG to SEQUENTIALLY PDA and PLB) , USING LEFT INTERNAL MAMMARY ARTERY, LEFT RADIAL ARTERY, AND  SAPHENOUS VEIN to [place], RIGHT GREATER SAPHENOUS VEIN HARVESTED ENDOSCOPICALLY;  Surgeon: Grace Isaac, MD;  Location: [place];  Service: Open Heart Surgery;  Laterality: N/A;  . CORONARY/GRAFT ACUTE MI REVASCULARIZATION N/A 07/24/2017   Procedure: Coronary/Graft Acute MI Revascularization;  Surgeon: Belva Crome, MD;  Location: [place] [facility];  Service: Cardiovascular;  Laterality: N/A;  . LAPAROSCOPIC GASTRIC BANDING  09/02/2008  . LEFT HEART CATH AND CORONARY ANGIOGRAPHY N/A 07/24/2017   Procedure: LEFT HEART CATH AND CORONARY ANGIOGRAPHY;  Surgeon: Belva Crome, MD;  Location: [place] [facility];  Service: Cardiovascular;  Laterality: N/A;  . RADIAL ARTERY HARVEST Left 08/02/2017   Procedure: LEFT RADIAL ARTERY HARVEST;  Surgeon: Grace Isaac, MD;  Location: [place];  Service: Open Heart Surgery;  Laterality: Left;  . STERNAL CLOSURE N/A 08/02/2017   Procedure: STERNAL PLATING;  Surgeon: Grace Isaac, MD;  Location: [place];  Service: Open Heart Surgery;  Laterality: N/A;  .  STOMACH SURGERY  1982   had metal removed from stomach as a child  . TEE WITHOUT CARDIOVERSION N/A 08/02/2017   Procedure: TRANSESOPHAGEAL ECHOCARDIOGRAM (TEE);  Surgeon: Grace Isaac, MD;  Location: [place];  Service: Open Heart Surgery;  Laterality: N/A;     Current Outpatient Medications  Medication Sig Dispense Refill  . acetaminophen (TYLENOL) 500 MG tablet Take 500 mg by mouth every 6 (six) hours as needed for mild pain or headache.    Marland Kitchen aspirin EC 81 MG EC tablet Take 1 tablet (81 mg total) daily by mouth.    . cholecalciferol (VITAMIN D3) 25 MCG (1000 UNIT) tablet Take 1,000 Units by mouth daily.    . clobetasol (OLUX) 0.05 % topical foam Apply 1 application topically  as directed.    . Coenzyme Q10 (COQ10) 100 MG CAPS Take 100 mg by mouth daily.    . colchicine 0.6 MG tablet Take two (2) tablets by mouth at time of initial pain. Then take one (1) tablet by mouth twice daily as needed for gout.    . metoprolol tartrate (LOPRESSOR) 25 MG tablet TAKE 1 TABLET(25 MG) BY MOUTH DAILY 90 tablet 1  . Misc Natural Products (DAILY HERBS BONE/JOINTS PO) Take 3-5 tablets by mouth daily. Insta flex    . TRULICITY 4.5 WI/0.9BD SOPN Inject 4.5 mg into the skin once a week.    Marland Kitchen atorvastatin (LIPITOR) 40 MG tablet Take 0.5 tablets (20 mg total) by mouth daily. 90 tablet 3  . clopidogrel (PLAVIX) 75 MG tablet Take 1 tablet (75 mg total) by mouth daily. 90 tablet 3  . olmesartan (BENICAR) 5 MG tablet Take 1 tablet (5 mg total) by mouth daily. 90 tablet 3   No current facility-administered medications for this visit.    Allergies:   Patient has no known allergies.    Social History:  The patient  reports that she has never smoked. She has never used smokeless tobacco. She reports current alcohol use. She reports that she does not use drugs.   Family History:  The patient's family history includes Cancer in her maternal grandfather and maternal grandmother; Diabetes in her father; Heart disease in her father; Hyperlipidemia in her mother; Hypertension in her father and mother; Obesity in her mother; Sudden death in her father.    ROS:  General:no colds or fevers, no weight changes Skin:no rashes or ulcers HEENT:no blurred vision, no congestion CV:see HPI PUL:see HPI GI:no diarrhea constipation or melena, no indigestion GU:no hematuria, no dysuria MS:no joint pain, no claudication Neuro:no syncope, no lightheadedness Endo:+ diabetes, no thyroid disease  Wt Readings from Last 3 Encounters:  01/01/21 259 lb 12.8 oz (117.8 kg)  01/17/20 254 lb 1.6 oz (115.3 kg)  12/31/19 258 lb 6.4 oz (117.2 kg)     PHYSICAL EXAM: VS:  BP 124/90   Pulse 76   Ht 5\' 4"  (1.626 m)    Wt 259 lb 12.8 oz (117.8 kg)   SpO2 97%   BMI 44.59 kg/m  , BMI Body mass index is 44.59 kg/m. General:Pleasant affect, NAD Skin:Warm and dry, brisk capillary refill HEENT:normocephalic, sclera clear, mucus membranes moist Neck:supple, no JVD, no bruits  Heart:S1S2 RRR without murmur, gallup, rub or click Lungs:clear without rales, rhonchi, or wheezes ZHG:DJME, non tender, + BS, do not palpate liver spleen or masses Ext:no lower ext edema, 2+ pedal pulses, 2+ radial pulses Neuro:alert and oriented, MAE, follows commands, + facial symmetry    EKG:  EKG is ordered today.  The ekg ordered today demonstrates SR small q waves, otherwise stable    Recent Labs: No results found for requested labs within last 8760 hours.    Lipid Panel    Component Value Date/Time   CHOL 111 03/21/2019 1529   TRIG 70 03/21/2019 1529   HDL 52 03/21/2019 1529   CHOLHDL 2.7 03/16/2018 0936   CHOLHDL 3.6 07/26/2017 0250   VLDL 25 07/26/2017 0250   LDLCALC 45 03/21/2019 1529       Other studies Reviewed: Additional studies/ records that were reviewed today include: .  Last cath 2018   Acute inferior ST elevation myocardial infarction  Total occlusion of the mid right coronary.  100% stenosis reduced to 0% at the angioplasty site following stenting with a 3.0 x 20 Promus Premier postdilated to 3.25 mm in diameter reestablishing TIMI grade III flow.  Severe distal RCA/PDA/RCA continuation Medina 111 bifurcation stenosis was revealed after recanalization of the mid occlusion.  Severe proximal LAD, 95%, 80-90% mid LAD, and 70% first diagonal stenosis.  90% proximal to mid circumflex stenosis.  Inferior wall hypokinesis.  EF 55%.  Acute diastolic heart failure with EDP 20 mmHg.  RECOMMENDATIONS:   Low-dose beta-blocker therapy  High intensity statin therapy  Aspirin and Brilinta  Aggrastat times 4-hour infusion  Heart team debate multilesion three-vessel PCI versus coronary artery  bypass grafting.  Will need TCTS consult.  Echo 2018  Aortic valve: Mild valve thickening present. Mild valve calcification  present.   Mitral valve: Mild leaflet thickening is present. Mild leaflet  calcification is present. Mild mitral annular calcification. Mild  regurgitation.   Right ventricle: Normal wall thickness. Cavity is mildly dilated. Mildly  reduced systolic function.   Tricuspid valve: Trace regurgitation.   ASSESSMENT AND PLAN:  1.  CAD with hx of MI and CABG in 2018. No angina and no dyspnea.  Is feeling well. Plans to begin exercising. 2.  HLD statin per Dr. Bubba Camp stable on last check per pt. LDL goal < 70  3.  DM-2 per Dr. Bubba Camp   4.  Obesity, morbid she will return to weight watchers and has started an exercise class 5.  HTN controlled continue meds.  6.  She was asking if she would be safe to become pregnant. Discussed she is high risk.  Will ask Dr. Tamala Julian for opinion.   Follow up in 6 months.    Current medicines are reviewed with the patient today.  The patient Has no concerns regarding medicines.  The following changes have been made:  See above Labs/ tests ordered today include:see above  Disposition:   FU:  see above  Signed, Cecilie Kicks, NP  01/02/2021 [facility] [place], [place], [place] Phone: [phone]; Fax: [phone]

## 2021-01-01 NOTE — Patient Instructions (Signed)
Medication Instructions:  Your physician recommends that you continue on your current medications as directed. Please refer to the Current Medication list given to you today.  *If you need a refill on your cardiac medications before your next appointment, please call your pharmacy*   Lab Work: None ordered  If you have labs (blood work) drawn today and your tests are completely normal, you will receive your results only by: Marland Kitchen MyChart Message (if you have MyChart) OR . A paper copy in the mail If you have any lab test that is abnormal or we need to change your treatment, we will call you to review the results.   Testing/Procedures: None ordered   Follow-Up: At [facility], you and your health needs are our priority.  As part of our continuing mission to provide you with exceptional heart care, we have created designated Provider Care Teams.  These Care Teams include your primary Cardiologist (physician) and Advanced Practice Providers (APPs -  Physician Assistants and Nurse Practitioners) who all work together to provide you with the care you need, when you need it.  We recommend signing up for the patient portal called "MyChart".  Sign up information is provided on this After Visit Summary.  MyChart is used to connect with patients for Virtual Visits (Telemedicine).  Patients are able to view lab/test results, encounter notes, upcoming appointments, etc.  Non-urgent messages can be sent to your provider as well.   To learn more about what you can do with MyChart, go to [email or web address].    Your next appointment:   12 month(s)  The format for your next appointment:   In Person  Provider:   You may see Sinclair Grooms, MD or one of the following Advanced Practice Providers on your designated Care Team:    Kathyrn Drown, NP    Other Instructions

## 2021-01-02 ENCOUNTER — Encounter: Payer: Self-pay | Admitting: Cardiology

## 2021-01-06 ENCOUNTER — Other Ambulatory Visit: Payer: Self-pay | Admitting: Internal Medicine

## 2021-01-06 DIAGNOSIS — E049 Nontoxic goiter, unspecified: Secondary | ICD-10-CM

## 2021-01-07 ENCOUNTER — Ambulatory Visit
Admission: RE | Admit: 2021-01-07 | Discharge: 2021-01-07 | Disposition: A | Discharge status: Home / Self Care | Payer: BC Managed Care – PPO | Source: Ambulatory Visit | Attending: Internal Medicine | Admitting: Internal Medicine

## 2021-01-07 DIAGNOSIS — E049 Nontoxic goiter, unspecified: Secondary | ICD-10-CM

## 2021-01-12 ENCOUNTER — Other Ambulatory Visit: Payer: Self-pay | Admitting: Internal Medicine

## 2021-01-12 DIAGNOSIS — E041 Nontoxic single thyroid nodule: Secondary | ICD-10-CM

## 2021-01-23 ENCOUNTER — Ambulatory Visit
Admission: RE | Admit: 2021-01-23 | Discharge: 2021-01-23 | Disposition: A | Discharge status: Home / Self Care | Payer: BC Managed Care – PPO | Source: Ambulatory Visit | Attending: Internal Medicine | Admitting: Internal Medicine

## 2021-01-23 ENCOUNTER — Other Ambulatory Visit (HOSPITAL_COMMUNITY)
Admission: RE | Admit: 2021-01-23 | Discharge: 2021-01-23 | Disposition: A | Discharge status: Home / Self Care | Payer: BC Managed Care – PPO | Source: Ambulatory Visit | Attending: Radiology | Admitting: Radiology

## 2021-01-23 DIAGNOSIS — D440 Neoplasm of uncertain behavior of thyroid gland: Secondary | ICD-10-CM | POA: Diagnosis not present

## 2021-01-23 DIAGNOSIS — E041 Nontoxic single thyroid nodule: Secondary | ICD-10-CM | POA: Diagnosis present

## 2021-01-26 LAB — CYTOLOGY - NON PAP

## 2021-03-31 ENCOUNTER — Other Ambulatory Visit: Payer: Self-pay | Admitting: Interventional Cardiology

## 2021-08-27 ENCOUNTER — Other Ambulatory Visit: Payer: Self-pay | Admitting: Endocrinology

## 2021-08-27 DIAGNOSIS — E049 Nontoxic goiter, unspecified: Secondary | ICD-10-CM

## 2021-08-28 ENCOUNTER — Other Ambulatory Visit (HOSPITAL_COMMUNITY): Payer: Self-pay

## 2021-08-28 MED ORDER — TRULICITY 3 MG/0.5ML ~~LOC~~ SOAJ
SUBCUTANEOUS | 6 refills | Status: DC
  Filled 2021-08-28: qty 2, 28d supply, fill #0

## 2021-09-17 ENCOUNTER — Ambulatory Visit
Admission: RE | Admit: 2021-09-17 | Discharge: 2021-09-17 | Disposition: A | Discharge status: Home / Self Care | Payer: BC Managed Care – PPO | Source: Ambulatory Visit | Attending: Endocrinology | Admitting: Endocrinology

## 2021-09-17 DIAGNOSIS — E049 Nontoxic goiter, unspecified: Secondary | ICD-10-CM

## 2021-09-22 ENCOUNTER — Other Ambulatory Visit: Payer: Self-pay | Admitting: Endocrinology

## 2021-09-22 DIAGNOSIS — E041 Nontoxic single thyroid nodule: Secondary | ICD-10-CM

## 2021-10-07 ENCOUNTER — Other Ambulatory Visit (HOSPITAL_COMMUNITY)
Admission: RE | Admit: 2021-10-07 | Discharge: 2021-10-07 | Disposition: A | Discharge status: Home / Self Care | Payer: BC Managed Care – PPO | Source: Ambulatory Visit | Attending: Radiology | Admitting: Radiology

## 2021-10-07 ENCOUNTER — Ambulatory Visit
Admission: RE | Admit: 2021-10-07 | Discharge: 2021-10-07 | Disposition: A | Discharge status: Home / Self Care | Payer: BC Managed Care – PPO | Source: Ambulatory Visit | Attending: Endocrinology | Admitting: Endocrinology

## 2021-10-07 DIAGNOSIS — D34 Benign neoplasm of thyroid gland: Secondary | ICD-10-CM | POA: Diagnosis not present

## 2021-10-07 DIAGNOSIS — E041 Nontoxic single thyroid nodule: Secondary | ICD-10-CM | POA: Diagnosis present

## 2021-10-08 LAB — CYTOLOGY - NON PAP

## 2021-10-25 ENCOUNTER — Encounter: Payer: Self-pay | Admitting: Interventional Cardiology

## 2021-10-26 MED ORDER — LABETALOL HCL 100 MG PO TABS: 100.0000 mg | ORAL_TABLET | Freq: Two times a day (BID) | ORAL | 3 refills | Status: DC

## 2021-10-26 NOTE — Telephone Encounter (Signed)
I called patient to discuss medications. Her OBGYN was calling her at the same time. I advised I would call her back  Called pt back, advised that :  She needs to STOP olmesartan, STOP metoprolol START labetalol 100mg  BID  I have asked her to keep an eye on her blood pressure. Call us if high. Would keep goal <130/80.  Statins are no longer CI in pregnancy. Patient is pretty high risk s/p STEMI followed by CABG. I will discuss with Dr. Tamala Julian, but feel she should continue. I advised that I am going to discuss with Dr. Tamala Julian. Continue for now.  Her endocrinologist already advised she stop her Montejaro. She has an apt with her to start other medications for her DM.  Plavix can be continued but ideally stopped 7 days prior to delivery. Will confirm with Dr. Tamala Julian that she should continue.  Ok to continue ASA, especially since she is high risk due to age and co morbidities.  I have asked her to stop her joint flex medication and use zyrtec only if really needed.

## 2021-11-05 DIAGNOSIS — O0990 Supervision of high risk pregnancy, unspecified, unspecified trimester: Secondary | ICD-10-CM | POA: Insufficient documentation

## 2021-11-05 NOTE — Progress Notes (Signed)
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  100% stenosis reduced to 0% at the angioplasty site following stenting with a 3.0 x 20 Promus Premier postdilated to 3.25 mm in diameter reestablishing TIMI grade III flow.  Severe distal RCA/PDA/RCA continuation Medina 111 bifurcation stenosis was revealed after recanalization of the mid occlusion. Severe proximal LAD, 95%, 80-90% mid LAD, and 70% first diagonal stenosis. 90% proximal to mid circumflex stenosis. Inferior wall hypokinesis.  EF 55%.  Acute diastolic heart failure with EDP 20 mmHg.    History of Present Illness:   [name] is a 41 y.o. female with the above problem list.  She was last seen in 4/22 by Cecilie Kicks, NP.  She contacted the office recently because she is pregnant.  Our PharmD recommended stopping her ARB and switching Metoprolol to Labetalol.  Dr. Tamala Julian recommended that she stop her Plavix.  Our PharmD noted that statin Rx should be ok to continue through pregnancy but, after d/w with Dr. Tamala Julian the recommendation is to continue if her Ob/Gyn agrees.  Her endocrinologist stopped her Mounjaro.    She is here alone today.  She has been doing well since she found out she was pregnant.  She is at 8 weeks now.  She has not  had chest pain, significant shortness of breath, syncope, leg edema.  Her obstetrician had her discontinue atorvastatin.        Past Medical History:  Diagnosis Date   Anxiety    Back pain    Chest pain    Coronary artery disease    Diabetes mellitus    Difficulty swallowing pills    Fatigue    GERD (gastroesophageal reflux disease)    Hay fever    Headache    Heartburn    History of heart attack    HTN (hypertension)    Hyperlipidemia    Joint pain    Knee pain    Nervousness    Palpitations    Shortness of breath    Shortness of breath on exertion    Stress    Current Medications: Current Meds  Medication Sig   acetaminophen (TYLENOL) 500 MG tablet Take 500 mg by mouth every 6 (six) hours as needed for mild pain or headache.   aspirin EC 81 MG EC tablet Take 1 tablet (81 mg total) daily by mouth.   cholecalciferol (VITAMIN D3) 25 MCG (1000 UNIT) tablet Take 1,000 Units by mouth daily.   clobetasol (OLUX) 0.05 % topical foam Apply 1 application topically as directed.   insulin aspart (NOVOLOG FLEXPEN) 100 UNIT/ML FlexPen Take 3-5 units before each meal depending on the sugar range   labetalol (NORMODYNE) 100 MG tablet Take 1 tablet (100 mg total) by mouth 2 (two) times daily.   NOVOLIN N FLEXPEN 100  UNIT/ML FlexPen Inject into the skin. Inject 8 units in the morning and 5 units in the evening    Allergies:   Patient has no known allergies.   Social History   Tobacco Use   Smoking status: Never   Smokeless tobacco: Never  Vaping Use   Vaping Use: Never used  Substance Use Topics   Alcohol use: Yes    Comment: occ   Drug use: No    Family Hx: The patient's family history includes Cancer in her maternal grandfather and maternal grandmother; Diabetes in her father; Heart disease in her father; Hyperlipidemia in her mother; Hypertension in her father and mother; Obesity in her mother; Sudden death in her father.  ROS see HPI  EKGs/Labs/Other Test Reviewed:     EKG:  EKG is not ordered today.  The ekg ordered today demonstrates n/a  Recent Labs: No results found for requested labs within last 8760 hours.   Recent Lipid Panel No results for input(s): CHOL, TRIG, HDL, VLDL, LDLCALC, LDLDIRECT in the last 8760 hours.   Risk Assessment/Calculations:         Physical Exam:    VS:  BP 118/72 (BP Location: Right Arm, Patient Position: Sitting, Cuff Size: Large)    Pulse 78    Ht 5\' 4"  (1.626 m)    Wt 254 lb 12.8 oz (115.6 kg)    SpO2 98%    BMI 43.74 kg/m     Wt Readings from Last 3 Encounters:  11/06/21 254 lb 12.8 oz (115.6 kg)  01/01/21 259 lb 12.8 oz (117.8 kg)  01/17/20 254 lb 1.6 oz (115.3 kg)    Constitutional:      Appearance: Healthy appearance. Not in distress.  Neck:     Vascular: No JVR. JVD normal.  Pulmonary:     Effort: Pulmonary effort is normal.     Breath sounds: No wheezing. No rales.  Cardiovascular:     Normal rate. Regular rhythm. Normal S1. Normal S2.      Murmurs: There is no murmur.  Edema:    Peripheral edema absent.  Abdominal:     Palpations: Abdomen is soft.  Skin:    General: Skin is warm and dry.  Neurological:     Mental Status: Alert and oriented to person, place and time.     Cranial Nerves: Cranial nerves are intact.        ASSESSMENT & PLAN:   High-risk pregnancy She will be high risk for complications throughout her pregnancy given her history of coronary artery disease, prior myocardial infarction and CABG. I reviewed her case with Dr. Harriet Masson with the [facility].  I will have the patient follow-up with her and she will be followed throughout her postpartum period.  Pure hypercholesterolemia with target low density lipoprotein (LDL) cholesterol less than 70 mg/dL [facility] labs personally reviewed and interpreted.  09/16/2021: Total cholesterol 124, HDL 35, LDL 71, triglycerides 90. She has been taken off of atorvastatin due to her pregnancy.  I will review with our pharmacy team to see  if there are any alternative therapies.  Essential hypertension Blood pressure is well controlled on labetalol 100 mg twice daily.  Coronary artery disease involving coronary bypass graft of native heart with angina pectoris ([place]) History of inferior STEMI in 2018 followed by CABG.  Due to her pregnancy, she has been taken off of atorvastatin at the direction of her obstetrician.  Her clopidogrel was also stopped.  Metoprolol was switched to labetalol.  Her ARB  was discontinued.  Continue aspirin 81 mg daily, labetalol 100 mg twice daily.           Dispo:  Return for evaluation with Dr. Harriet Masson in the [facility].   Medication Adjustments/Labs and Tests Ordered: Current medicines are reviewed at length with the patient today.  Concerns regarding medicines are outlined above.  Tests Ordered: Orders Placed This Encounter  Procedures   AMB Referral to Cardio Obstetrics   Medication Changes: No orders of the defined types were placed in this encounter.  Signed, Richardson Dopp, PA-C  11/06/2021 9:55 AM    [facility] [place], [place], [place]  [address] Phone: [phone]; Fax: [phone]

## 2021-11-06 ENCOUNTER — Encounter: Payer: Self-pay | Admitting: Physician Assistant

## 2021-11-06 ENCOUNTER — Other Ambulatory Visit: Payer: Self-pay

## 2021-11-06 ENCOUNTER — Ambulatory Visit: Payer: BC Managed Care – PPO | Admitting: Physician Assistant

## 2021-11-06 VITALS — BP 118/72 | HR 78 | Ht 64.0 in | Wt 254.8 lb

## 2021-11-06 DIAGNOSIS — O0991 Supervision of high risk pregnancy, unspecified, first trimester: Secondary | ICD-10-CM

## 2021-11-06 DIAGNOSIS — E7800 Pure hypercholesterolemia, unspecified: Secondary | ICD-10-CM

## 2021-11-06 DIAGNOSIS — I25709 Atherosclerosis of coronary artery bypass graft(s), unspecified, with unspecified angina pectoris: Secondary | ICD-10-CM | POA: Diagnosis not present

## 2021-11-06 DIAGNOSIS — I10 Essential (primary) hypertension: Secondary | ICD-10-CM | POA: Diagnosis not present

## 2021-11-06 NOTE — Assessment & Plan Note (Addendum)
Blood pressure is well controlled on labetalol 100 mg twice daily.

## 2021-11-06 NOTE — Assessment & Plan Note (Addendum)
[facility] labs personally reviewed and interpreted.  09/16/2021: Total cholesterol 124, HDL 35, LDL 71, triglycerides 90. She has been taken off of atorvastatin due to her pregnancy.  I will review with our pharmacy team to see if there are any alternative therapies.

## 2021-11-06 NOTE — Assessment & Plan Note (Signed)
She will be high risk for complications throughout her pregnancy given her history of coronary artery disease, prior myocardial infarction and CABG. I reviewed her case with Dr. Harriet Masson with the [facility] clinic.  I will have the patient follow-up with her and she will be followed throughout her postpartum period.

## 2021-11-06 NOTE — Assessment & Plan Note (Signed)
History of inferior STEMI in 2018 followed by CABG.  Due to her pregnancy, she has been taken off of atorvastatin at the direction of her obstetrician.  Her clopidogrel was also stopped.  Metoprolol was switched to labetalol.  Her ARB was discontinued.  Continue aspirin 81 mg daily, labetalol 100 mg twice daily.

## 2021-11-06 NOTE — Patient Instructions (Signed)
Medication Instructions:    *If you need a refill on your cardiac medications before your next appointment, please call your pharmacy*   Follow-Up: At [facility], you and your health needs are our priority.  As part of our continuing mission to provide you with exceptional heart care, we have created designated Provider Care Teams.  These Care Teams include your primary Cardiologist (physician) and Advanced Practice Providers (APPs -  Physician Assistants and Nurse Practitioners) who all work together to provide you with the care you need, when you need it.  We recommend signing up for the patient portal called "MyChart".  Sign up information is provided on this After Visit Summary.  MyChart is used to connect with patients for Virtual Visits (Telemedicine).  Patients are able to view lab/test results, encounter notes, upcoming appointments, etc.  Non-urgent messages can be sent to your provider as well.   To learn more about what you can do with MyChart, go to [email or web address].    Your next appointment:   4 week(s)  The format for your next appointment:   In Person  Provider:   Dr. Berniece Salines     Other Instructions  You have been referred to [facility] at [facility] at [facility].

## 2021-11-06 NOTE — Assessment & Plan Note (Deleted)
K[facility] labs personally reviewed and interpreted.  09/16/2021: Total cholesterol 124, HDL 35, LDL 71, triglycerides 90. She has been taken off of atorvastatin due to her pregnancy.  I will review with our pharmacy team to see if there are any alternative therapies.

## 2021-11-19 ENCOUNTER — Other Ambulatory Visit: Payer: Self-pay

## 2021-11-19 ENCOUNTER — Encounter: Payer: Self-pay | Admitting: Dermatology

## 2021-11-19 ENCOUNTER — Ambulatory Visit: Payer: BC Managed Care – PPO | Admitting: Dermatology

## 2021-11-19 DIAGNOSIS — L739 Follicular disorder, unspecified: Secondary | ICD-10-CM

## 2021-11-19 DIAGNOSIS — L219 Seborrheic dermatitis, unspecified: Secondary | ICD-10-CM

## 2021-11-19 MED ORDER — CLOBETASOL PROPIONATE 0.05 % EX FOAM: 1.0000 "application " | CUTANEOUS | 3 refills | Status: DC

## 2021-11-19 NOTE — Patient Instructions (Signed)
Shave with a electric shaver not razor, pick up over the counter benzol peroxide in the acne medication section for the legs  ?

## 2021-11-20 MED ORDER — METOPROLOL TARTRATE 25 MG PO TABS: 25.0000 mg | ORAL_TABLET | Freq: Two times a day (BID) | ORAL | 3 refills | Status: DC

## 2021-11-20 MED ORDER — OLMESARTAN MEDOXOMIL 5 MG PO TABS: 5.0000 mg | ORAL_TABLET | Freq: Every day | ORAL | 3 refills | Status: DC

## 2021-11-20 MED ORDER — ATORVASTATIN CALCIUM 20 MG PO TABS: 20.0000 mg | ORAL_TABLET | Freq: Every day | ORAL | 3 refills | Status: DC

## 2021-11-29 ENCOUNTER — Encounter: Payer: Self-pay | Admitting: Dermatology

## 2021-11-29 NOTE — Progress Notes (Signed)
? ?  Follow-Up Visit ?  ?Subjective  ?[name] is a 41 y.o. female who presents for the following: Skin Problem (Patient here today to get a refill of clobetasol foam for her scalp x years. Per patient the clobetasol did help with her scalp issue. Patients also has red irritation on both lower legs x 4 months per patient worse with shaving. ). ? ?Inflammation on scalp and legs ?Location:  ?Duration:  ?Quality:  ?Associated Signs/Symptoms: ?Modifying Factors:  ?Severity:  ?Timing: ?Context:  ? ?Objective  ?Well appearing patient in no apparent distress; mood and affect are within normal limits. ?Left Lower Leg - Anterior ?Left and right lower legs with post inflammatory hyper pigmentation  ? ?Scalp ?Modest scale without psoriasiform plaques scalp, no involvement of ears and face ? ? ? ?A focused examination was performed including head, neck, legs. Relevant physical exam findings are noted in the Assessment and Plan. ? ? ?Assessment & Plan  ? ? ?Folliculitis ?Left Lower Leg - Anterior ? ?Shave with a electric not razor, pick up otc benzol peroxide cleanser in the acne medication section  ? ?Seborrheic dermatitis ?Scalp ? ?Related Medications ?clobetasol (OLUX) 0.05 % topical foam ?Apply 1 application topically as directed. ? ? ? ? ? ?I, Lavonna Monarch, MD, have reviewed all documentation for this visit.  The documentation on 11/29/21 for the exam, diagnosis, procedures, and orders are all accurate and complete. ?

## 2021-12-04 ENCOUNTER — Ambulatory Visit: Payer: BC Managed Care – PPO | Admitting: Cardiology

## 2021-12-08 ENCOUNTER — Encounter: Payer: Self-pay | Admitting: *Deleted

## 2022-02-02 ENCOUNTER — Ambulatory Visit: Payer: BC Managed Care – PPO | Admitting: Interventional Cardiology

## 2022-04-28 ENCOUNTER — Encounter (INDEPENDENT_AMBULATORY_CARE_PROVIDER_SITE_OTHER): Payer: Self-pay

## 2022-05-16 NOTE — Progress Notes (Unsigned)
Cardiology Office Note:    Date:  05/17/2022   ID:  [name], DOB [birth year], MRN [id number]  PCP:  Michael Boston, MD  Cardiologist:  Sinclair Grooms, MD   Referring MD: Michael Boston, MD   Chief Complaint  Patient presents with   Coronary Artery Disease   Hypertension   Follow-up    Obesity    History of Present Illness:    [name] is a 41 y.o. female with a hx of DM type II with elevated A1c, obesity status post lap band procedure in 2009, coronary artery disease status post inferior STEMI followed by CABG (LIMA to LAD, left radial to diagonal, SVG sequential to PDA and PL) in November 2018.   Saw Richardson Dopp 10/2021 and is pregnant.  Unfortunately, [name] lost the pregnancy much earlier this year.  She has discontinued labetalol and gone back to metoprolol succinate 25 mg/day.  Benicar has been discontinued in case she gets pregnant again and for the time being she is holding Mounjaro.  She denies angina, orthopnea, PND, lower extremity swelling, palpitations, and syncope.  Past Medical History:  Diagnosis Date   Anxiety    Back pain    Chest pain    Coronary artery disease    Diabetes mellitus    Difficulty swallowing pills    Fatigue    GERD (gastroesophageal reflux disease)    Hay fever    Headache    Heartburn    History of heart attack    HTN (hypertension)    Hyperlipidemia    Joint pain    Knee pain    Nervousness    Palpitations    Shortness of breath    Shortness of breath on exertion    Stress     Past Surgical History:  Procedure Laterality Date   CORONARY ARTERY BYPASS GRAFT N/A 08/02/2017   Procedure: CORONARY ARTERY BYPASS GRAFTING (CABG) X 5 (LIMA to LAD, LEFT RADIAL ARTERY to DIAGONAL, SVG to SEQUENTIALLY PDA and PLB) , USING LEFT INTERNAL MAMMARY ARTERY, LEFT RADIAL ARTERY, AND  SAPHENOUS VEIN to [place], RIGHT GREATER SAPHENOUS VEIN HARVESTED ENDOSCOPICALLY;  Surgeon: Grace Isaac, MD;  Location: [place];   Service: Open Heart Surgery;  Laterality: N/A;   CORONARY/GRAFT ACUTE MI REVASCULARIZATION N/A 07/24/2017   Procedure: Coronary/Graft Acute MI Revascularization;  Surgeon: Belva Crome, MD;  Location: [place] [facility];  Service: Cardiovascular;  Laterality: N/A;   LAPAROSCOPIC GASTRIC BANDING  09/02/2008   LEFT HEART CATH AND CORONARY ANGIOGRAPHY N/A 07/24/2017   Procedure: LEFT HEART CATH AND CORONARY ANGIOGRAPHY;  Surgeon: Belva Crome, MD;  Location: [place] [facility];  Service: Cardiovascular;  Laterality: N/A;   RADIAL ARTERY HARVEST Left 08/02/2017   Procedure: LEFT RADIAL ARTERY HARVEST;  Surgeon: Grace Isaac, MD;  Location: [place];  Service: Open Heart Surgery;  Laterality: Left;   STERNAL CLOSURE N/A 08/02/2017   Procedure: STERNAL PLATING;  Surgeon: Grace Isaac, MD;  Location: [place];  Service: Open Heart Surgery;  Laterality: N/A;   STOMACH SURGERY  1982   had metal removed from stomach as a child   TEE WITHOUT CARDIOVERSION N/A 08/02/2017   Procedure: TRANSESOPHAGEAL ECHOCARDIOGRAM (TEE);  Surgeon: Grace Isaac, MD;  Location: [place];  Service: Open Heart Surgery;  Laterality: N/A;    Current Medications: Current Meds  Medication Sig   acetaminophen (TYLENOL) 500 MG tablet Take 500 mg by mouth every 6 (six) hours as  needed for mild pain or headache.   aspirin EC 81 MG EC tablet Take 1 tablet (81 mg total) daily by mouth.   atorvastatin (LIPITOR) 20 MG tablet Take 1 tablet (20 mg total) by mouth daily.   BD PEN NEEDLE NANO 2ND GEN 32G X 4 MM MISC See admin instructions.   cholecalciferol (VITAMIN D3) 25 MCG (1000 UNIT) tablet Take 1,000 Units by mouth daily.   clobetasol (OLUX) 0.05 % topical foam Apply 1 application topically as directed.   Continuous Blood Gluc Sensor (FREESTYLE LIBRE 2 SENSOR) MISC USE AS DIRECTED EVERY 2 WEEKS   insulin aspart (NOVOLOG FLEXPEN) 100 UNIT/ML FlexPen Take 3-5 units before each meal depending on the sugar range   metoprolol  tartrate (LOPRESSOR) 25 MG tablet Take 1 tablet by mouth daily. With food   NOVOLIN N FLEXPEN 100 UNIT/ML FlexPen Inject into the skin. Inject 8 units in the morning and 5 units in the evening   Prenatal 28-0.8 MG TABS Take 1 tablet by mouth daily.   Pyridoxine HCl (VITAMIN B6) 50 MG TABS Take 1 tablet by mouth daily.   Turmeric 500 MG CAPS See admin instructions.     Allergies:   Patient has no known allergies.   Social History   Socioeconomic History   Marital status: Married    Spouse name: [name]   Number of children: Not on file   Years of education: Not on file   Highest education level: Not on file  Occupational History   Occupation: Dealer: [employer]  Tobacco Use   Smoking status: Never   Smokeless tobacco: Never  Vaping Use   Vaping Use: Never used  Substance and Sexual Activity   Alcohol use: Yes    Comment: occ   Drug use: No   Sexual activity: Not on file  Other Topics Concern   Not on file  Social History Narrative   Not on file   Social Determinants of Health   Financial Resource Strain: Not on file  Food Insecurity: Not on file  Transportation Needs: Not on file  Physical Activity: Not on file  Stress: Not on file  Social Connections: Not on file     Family History: The patient's family history includes Cancer in her maternal grandfather and maternal grandmother; Diabetes in her father; Heart disease in her father; Hyperlipidemia in her mother; Hypertension in her father and mother; Obesity in her mother; Sudden death in her father.  ROS:   Please see the history of present illness.    She is still trying to get pregnant.  She is hopeful.  All other systems reviewed and are negative.  EKGs/Labs/Other Studies Reviewed:    The following studies were reviewed today: No new tracings or imaging  EKG:  EKG not repeated  Recent Labs: No results found for requested labs within last 365 days.  Recent  Lipid Panel    Component Value Date/Time   CHOL 111 03/21/2019 1529   TRIG 70 03/21/2019 1529   HDL 52 03/21/2019 1529   CHOLHDL 2.7 03/16/2018 0936   CHOLHDL 3.6 07/26/2017 0250   VLDL 25 07/26/2017 0250   LDLCALC 45 03/21/2019 1529    Physical Exam:    VS:  BP (!) 142/90   Pulse 79   Ht '5\' 4"'$  (1.626 m)   Wt 267 lb (121.1 kg)   SpO2 94%   BMI 45.83 kg/m     Wt Readings from Last 3 Encounters:  05/17/22 267 lb (121.1 kg)  11/06/21 254 lb 12.8 oz (115.6 kg)  01/01/21 259 lb 12.8 oz (117.8 kg)     GEN: Morbid. No acute distress HEENT: Normal NECK: No JVD. LYMPHATICS: No lymphadenopathy CARDIAC: no murmur. RRR no gallop, or edema. VASCULAR:  Normal Pulses. No bruits. RESPIRATORY:  Clear to auscultation without rales, wheezing or rhonchi  ABDOMEN: Soft, non-tender, non-distended, No pulsatile mass, MUSCULOSKELETAL: No deformity  SKIN: Warm and dry NEUROLOGIC:  Alert and oriented x 3 PSYCHIATRIC:  Normal affect   ASSESSMENT:    1. Coronary artery disease involving coronary bypass graft of native heart with angina pectoris ([place])   2. Diabetes mellitus type 2 in obese ([place])   3. Essential hypertension   4. Pure hypercholesterolemia with target low density lipoprotein (LDL) cholesterol less than 70 mg/dL   5. Morbid obesity ([place])    PLAN:    In order of problems listed above:  Secondary prevention reviewed.  Stressed the importance of physical activity. Continue insulin.  Resume Mounjaro when appropriate.  Holding now because she is again trying to get pregnant. Blood pressure target 130/80 mmHg.  Currently taking metoprolol, but has gained weight and is now paying attention to calorie reduction, salt reduction, and increasing physical activity.  And could add on for blood pressure while she is trying to get pregnant may be hydralazine and further increase and metoprolol dose or considering labetalol reinstitution.  Target blood pressure 130/80 mmHg or less.  Continue  to observe the [employer]. Continue Lipitor 20 mg/day.  She follows with Dr. Michiel Sites for both diabetes and her lipids.  Laboratory data is upcoming. Exercise, resume Mounjaro as soon as possible.   Notified that I will be retiring.  She will have cardiology follow-up in 1 year with a new assigned cardiologist.   Medication Adjustments/Labs and Tests Ordered: Current medicines are reviewed at length with the patient today.  Concerns regarding medicines are outlined above.  No orders of the defined types were placed in this encounter.  No orders of the defined types were placed in this encounter.   Patient Instructions  Medication Instructions:  Your physician recommends that you continue on your current medications as directed. Please refer to the Current Medication list given to you today.  *If you need a refill on your cardiac medications before your next appointment, please call your pharmacy*  Lab Work: NONE  Testing/Procedures: NONE  Follow-Up: At [facility], you and your health needs are our priority.  As part of our continuing mission to provide you with exceptional heart care, we have created designated Provider Care Teams.  These Care Teams include your primary Cardiologist (physician) and Advanced Practice Providers (APPs -  Physician Assistants and Nurse Practitioners) who all work together to provide you with the care you need, when you need it.  Your next appointment:   1 year(s)  The format for your next appointment:   In Person  Provider:   Sinclair Grooms, MD      Important Information About Sugar         Signed, Sinclair Grooms, MD  05/17/2022 10:03 AM    [place]

## 2022-05-17 ENCOUNTER — Ambulatory Visit: Payer: BC Managed Care – PPO | Attending: Interventional Cardiology | Admitting: Interventional Cardiology

## 2022-05-17 ENCOUNTER — Encounter: Payer: Self-pay | Admitting: Interventional Cardiology

## 2022-05-17 VITALS — BP 142/90 | HR 79 | Ht 64.0 in | Wt 267.0 lb

## 2022-05-17 DIAGNOSIS — I25709 Atherosclerosis of coronary artery bypass graft(s), unspecified, with unspecified angina pectoris: Secondary | ICD-10-CM

## 2022-05-17 DIAGNOSIS — E1169 Type 2 diabetes mellitus with other specified complication: Secondary | ICD-10-CM | POA: Diagnosis not present

## 2022-05-17 DIAGNOSIS — I10 Essential (primary) hypertension: Secondary | ICD-10-CM | POA: Diagnosis not present

## 2022-05-17 DIAGNOSIS — E7800 Pure hypercholesterolemia, unspecified: Secondary | ICD-10-CM

## 2022-05-17 DIAGNOSIS — E669 Obesity, unspecified: Secondary | ICD-10-CM

## 2022-05-17 NOTE — Patient Instructions (Signed)
Medication Instructions:  Your physician recommends that you continue on your current medications as directed. Please refer to the Current Medication list given to you today.  *If you need a refill on your cardiac medications before your next appointment, please call your pharmacy*  Lab Work: NONE  Testing/Procedures: NONE  Follow-Up: At [facility], you and your health needs are our priority.  As part of our continuing mission to provide you with exceptional heart care, we have created designated Provider Care Teams.  These Care Teams include your primary Cardiologist (physician) and Advanced Practice Providers (APPs -  Physician Assistants and Nurse Practitioners) who all work together to provide you with the care you need, when you need it.  Your next appointment:   1 year(s)  The format for your next appointment:   In Person  Provider:   Henry W Smith III, MD    Important Information About Sugar       

## 2022-08-20 ENCOUNTER — Other Ambulatory Visit: Payer: Self-pay | Admitting: Home Modifications

## 2022-08-20 DIAGNOSIS — E049 Nontoxic goiter, unspecified: Secondary | ICD-10-CM

## 2022-09-15 ENCOUNTER — Ambulatory Visit
Admission: RE | Admit: 2022-09-15 | Discharge: 2022-09-15 | Disposition: A | Discharge status: Home / Self Care | Payer: BC Managed Care – PPO | Source: Ambulatory Visit | Attending: Home Modifications | Admitting: Home Modifications

## 2022-09-15 DIAGNOSIS — E049 Nontoxic goiter, unspecified: Secondary | ICD-10-CM

## 2022-11-22 ENCOUNTER — Ambulatory Visit: Payer: BC Managed Care – PPO | Admitting: Dermatology

## 2022-11-22 ENCOUNTER — Other Ambulatory Visit: Payer: Self-pay

## 2022-11-22 MED ORDER — ATORVASTATIN CALCIUM 20 MG PO TABS: 20.0000 mg | ORAL_TABLET | Freq: Every day | ORAL | 0 refills | Status: DC

## 2022-11-22 MED ORDER — METOPROLOL TARTRATE 25 MG PO TABS: 25.0000 mg | ORAL_TABLET | Freq: Every day | ORAL | 1 refills | Status: DC

## 2022-11-22 NOTE — Addendum Note (Signed)
Addended by: Carter Kitten D on: 11/22/2022 12:06 PM   Modules accepted: Orders

## 2022-12-16 ENCOUNTER — Other Ambulatory Visit (HOSPITAL_COMMUNITY): Payer: Self-pay

## 2022-12-16 MED ORDER — MOUNJARO 15 MG/0.5ML ~~LOC~~ SOAJ
15.0000 mg | SUBCUTANEOUS | 0 refills | Status: DC
  Filled 2022-12-16: qty 6, 84d supply, fill #0
  Filled 2023-01-11: qty 2, 28d supply, fill #0
  Filled 2023-02-11: qty 2, 28d supply, fill #1
  Filled 2023-06-10: qty 2, 28d supply, fill #2

## 2022-12-20 ENCOUNTER — Other Ambulatory Visit (HOSPITAL_COMMUNITY): Payer: Self-pay

## 2022-12-20 MED ORDER — MOUNJARO 10 MG/0.5ML ~~LOC~~ SOAJ
10.0000 mg | SUBCUTANEOUS | 0 refills | Status: DC
  Filled 2022-12-20: qty 2, 28d supply, fill #0

## 2023-01-11 ENCOUNTER — Other Ambulatory Visit (HOSPITAL_COMMUNITY): Payer: Self-pay

## 2023-01-11 ENCOUNTER — Other Ambulatory Visit: Payer: Self-pay

## 2023-01-20 ENCOUNTER — Other Ambulatory Visit (HOSPITAL_COMMUNITY): Payer: Self-pay

## 2023-01-20 MED ORDER — MOUNJARO 15 MG/0.5ML ~~LOC~~ SOAJ
15.0000 mg | SUBCUTANEOUS | 1 refills | Status: DC
  Filled 2023-01-20 – 2023-04-11 (×4): qty 2, 28d supply, fill #0
  Filled 2023-05-11: qty 2, 28d supply, fill #1

## 2023-01-31 LAB — HM PAP SMEAR: HM Pap smear: NEGATIVE

## 2023-02-02 ENCOUNTER — Other Ambulatory Visit: Payer: Self-pay | Admitting: Obstetrics and Gynecology

## 2023-02-02 DIAGNOSIS — Z0000 Encounter for general adult medical examination without abnormal findings: Secondary | ICD-10-CM

## 2023-02-11 ENCOUNTER — Other Ambulatory Visit (HOSPITAL_COMMUNITY): Payer: Self-pay

## 2023-03-02 ENCOUNTER — Other Ambulatory Visit: Payer: Self-pay

## 2023-03-02 ENCOUNTER — Ambulatory Visit
Admission: RE | Admit: 2023-03-02 | Discharge: 2023-03-02 | Disposition: A | Discharge status: Home / Self Care | Payer: BC Managed Care – PPO | Source: Ambulatory Visit | Attending: Obstetrics and Gynecology | Admitting: Obstetrics and Gynecology

## 2023-03-02 DIAGNOSIS — Z0000 Encounter for general adult medical examination without abnormal findings: Secondary | ICD-10-CM

## 2023-03-07 ENCOUNTER — Other Ambulatory Visit (HOSPITAL_COMMUNITY): Payer: Self-pay

## 2023-03-08 ENCOUNTER — Other Ambulatory Visit (HOSPITAL_COMMUNITY): Payer: Self-pay

## 2023-03-14 ENCOUNTER — Other Ambulatory Visit (HOSPITAL_COMMUNITY): Payer: Self-pay

## 2023-03-16 ENCOUNTER — Other Ambulatory Visit (HOSPITAL_COMMUNITY): Payer: Self-pay

## 2023-03-16 MED ORDER — MOUNJARO 12.5 MG/0.5ML ~~LOC~~ SOAJ
12.5000 mg | SUBCUTANEOUS | 0 refills | Status: DC
  Filled 2023-03-16: qty 2, 28d supply, fill #0

## 2023-03-17 ENCOUNTER — Other Ambulatory Visit (HOSPITAL_COMMUNITY): Payer: Self-pay

## 2023-04-04 ENCOUNTER — Other Ambulatory Visit (HOSPITAL_COMMUNITY): Payer: Self-pay

## 2023-04-11 ENCOUNTER — Other Ambulatory Visit (HOSPITAL_COMMUNITY): Payer: Self-pay

## 2023-04-18 ENCOUNTER — Other Ambulatory Visit (HOSPITAL_COMMUNITY): Payer: Self-pay

## 2023-04-18 MED ORDER — MOUNJARO 15 MG/0.5ML ~~LOC~~ SOAJ
15.0000 mg | SUBCUTANEOUS | 0 refills | Status: DC
  Filled 2023-04-18: qty 2, 28d supply, fill #0

## 2023-04-22 ENCOUNTER — Other Ambulatory Visit (HOSPITAL_COMMUNITY): Payer: Self-pay

## 2023-05-11 ENCOUNTER — Other Ambulatory Visit (HOSPITAL_COMMUNITY): Payer: Self-pay

## 2023-05-20 ENCOUNTER — Other Ambulatory Visit: Payer: Self-pay | Admitting: Cardiology

## 2023-05-20 ENCOUNTER — Encounter: Payer: Self-pay | Admitting: Cardiology

## 2023-05-20 ENCOUNTER — Ambulatory Visit: Payer: BC Managed Care – PPO | Admitting: Cardiology

## 2023-05-20 VITALS — BP 138/86 | HR 70 | Ht 64.0 in | Wt 243.0 lb

## 2023-05-20 DIAGNOSIS — E119 Type 2 diabetes mellitus without complications: Secondary | ICD-10-CM

## 2023-05-20 DIAGNOSIS — Z794 Long term (current) use of insulin: Secondary | ICD-10-CM

## 2023-05-20 DIAGNOSIS — I2510 Atherosclerotic heart disease of native coronary artery without angina pectoris: Secondary | ICD-10-CM | POA: Diagnosis not present

## 2023-05-20 DIAGNOSIS — Z3169 Encounter for other general counseling and advice on procreation: Secondary | ICD-10-CM

## 2023-05-20 DIAGNOSIS — I10 Essential (primary) hypertension: Secondary | ICD-10-CM | POA: Diagnosis not present

## 2023-05-20 DIAGNOSIS — E782 Mixed hyperlipidemia: Secondary | ICD-10-CM

## 2023-05-20 LAB — LIPID PANEL
Chol/HDL Ratio: 2.9 ratio (ref 0.0–4.4)
Cholesterol, Total: 141 mg/dL (ref 100–199)
HDL: 49 mg/dL (ref >39)
LDL Chol Calc (NIH): 79 mg/dL (ref 0–99)
Triglycerides: 62 mg/dL (ref 0–149)
VLDL Cholesterol Cal: 13 mg/dL (ref 5–40)

## 2023-05-20 MED ORDER — AMLODIPINE BESYLATE 5 MG PO TABS: 5.0000 mg | ORAL_TABLET | Freq: Every day | ORAL | 3 refills | Status: DC

## 2023-05-20 NOTE — Progress Notes (Signed)
Cardio-Obstetrics Clinic  New Evaluation  Date:  05/20/2023   ID:  [name], DOB [birth year], MRN [id number]  PCP:  Melida [place], MD   [facility] Providers Cardiologist:  Thomasene Ripple, DO  Electrophysiologist:  None       Referring MD: Melida [place], MD   Chief Complaint: "I am ok"  History of Present Illness:    [name] is a 42 y.o. female [G1P0000] who is being seen today for the evaluation of preconceptual counseling at the request of Wile, Nyoka Cowden, MD.   Medical history includes diabetes mellitus type 2, obesity she is status post lap band in 2009, coronary artery disease status post inferior wall MI at the age of 25, status post CABG (LIMA to LAD, left radial to diagonal, SVG to sequential to PDA and PLb this was done in November 2018, hypertension, hyperlipidemia.  She previously followed with Dr. Katrinka Blazing and now is establishing care here with me follow-up with the  She was pregnant last year but lost her babies to spontaneous abortion.  She still desires to have another baby that is why she is here to start optimize her medical regimen as well as discuss her possibilities.  Prior CV Studies Reviewed: The following studies were reviewed today: echo  Past Medical History:  Diagnosis Date   Anxiety    Back pain    Chest pain    Coronary artery disease    Diabetes mellitus    Difficulty swallowing pills    Fatigue    GERD (gastroesophageal reflux disease)    Hay fever    Headache    Heartburn    History of heart attack    HTN (hypertension)    Hyperlipidemia    Joint pain    Knee pain    Nervousness    Palpitations    Shortness of breath    Shortness of breath on exertion    Stress     Past Surgical History:  Procedure Laterality Date   CORONARY ARTERY BYPASS GRAFT N/A 08/02/2017   Procedure: CORONARY ARTERY BYPASS GRAFTING (CABG) X 5 (LIMA to LAD, LEFT RADIAL ARTERY to DIAGONAL, SVG to SEQUENTIALLY PDA and PLB) , USING LEFT  INTERNAL MAMMARY ARTERY, LEFT RADIAL ARTERY, AND  SAPHENOUS VEIN to CIRCUMFLEX, RIGHT GREATER SAPHENOUS VEIN HARVESTED ENDOSCOPICALLY;  Surgeon: Delight Ovens, MD;  Location: [facility] OR;  Service: Open Heart Surgery;  Laterality: N/A;   CORONARY/GRAFT ACUTE MI REVASCULARIZATION N/A 07/24/2017   Procedure: Coronary/Graft Acute MI Revascularization;  Surgeon: Lyn Records, MD;  Location: [facility];  Service: Cardiovascular;  Laterality: N/A;   LAPAROSCOPIC GASTRIC BANDING  09/02/2008   LEFT HEART CATH AND CORONARY ANGIOGRAPHY N/A 07/24/2017   Procedure: LEFT HEART CATH AND CORONARY ANGIOGRAPHY;  Surgeon: Lyn Records, MD;  Location: [facility];  Service: Cardiovascular;  Laterality: N/A;   RADIAL ARTERY HARVEST Left 08/02/2017   Procedure: LEFT RADIAL ARTERY HARVEST;  Surgeon: Delight Ovens, MD;  Location: [facility] OR;  Service: Open Heart Surgery;  Laterality: Left;   STERNAL CLOSURE N/A 08/02/2017   Procedure: STERNAL PLATING;  Surgeon: Delight Ovens, MD;  Location: [facility] OR;  Service: Open Heart Surgery;  Laterality: N/A;   STOMACH SURGERY  1982   had metal removed from stomach as a child   TEE WITHOUT CARDIOVERSION N/A 08/02/2017   Procedure: TRANSESOPHAGEAL ECHOCARDIOGRAM (TEE);  Surgeon: Delight Ovens, MD;  Location: [facility] OR;  Service: Open Heart Surgery;  Laterality: N/A;      OB History     Gravida  1   Para  0   Term  0   Preterm  0   AB  0   Living  0      SAB  0   IAB  0   Ectopic  0   Multiple  0   Live Births  0               Current Medications: Current Meds  Medication Sig   acetaminophen (TYLENOL) 500 MG tablet Take 500 mg by mouth every 6 (six) hours as needed for mild pain or headache.   amLODipine (NORVASC) 5 MG tablet Take 1 tablet (5 mg total) by mouth daily.   aspirin EC 81 MG EC tablet Take 1 tablet (81 mg total) daily by mouth.   cholecalciferol (VITAMIN D3) 25 MCG (1000 UNIT) tablet Take 1,000 Units by mouth daily.    clobetasol (OLUX) 0.05 % topical foam Apply 1 application topically as directed.   Prenatal 28-0.8 MG TABS Take 1 tablet by mouth daily.   Pyridoxine HCl (VITAMIN B6) 50 MG TABS Take 1 tablet by mouth daily.   tirzepatide (MOUNJARO) 10 MG/0.5ML Pen Inject 10 mg into the skin once a week.   tirzepatide (MOUNJARO) 12.5 MG/0.5ML Pen Inject 12.5 mg into the skin once a week.   tirzepatide (MOUNJARO) 15 MG/0.5ML Pen Inject 15 mg into the skin once a week.   tirzepatide (MOUNJARO) 15 MG/0.5ML Pen Inject 15 mg into the skin every 7 (seven) days.   tirzepatide (MOUNJARO) 15 MG/0.5ML Pen Inject 15 mg into the skin once a week.   Turmeric 500 MG CAPS See admin instructions.   [DISCONTINUED] atorvastatin (LIPITOR) 20 MG tablet Take 1 tablet (20 mg total) by mouth daily.   [DISCONTINUED] metoprolol tartrate (LOPRESSOR) 25 MG tablet Take 1 tablet (25 mg total) by mouth daily. With food     Allergies:   Patient has no known allergies.   Social History   Socioeconomic History   Marital status: Married    Spouse name: Gwenith Coker   Number of children: Not on file   Years of education: Not on file   Highest education level: Not on file  Occupational History   Occupation: Retail banker: [employer]  Tobacco Use   Smoking status: Never   Smokeless tobacco: Never  Vaping Use   Vaping status: Never Used  Substance and Sexual Activity   Alcohol use: Yes    Comment: occ   Drug use: No   Sexual activity: Not on file  Other Topics Concern   Not on file  Social History Narrative   Not on file   Social Determinants of Health   Financial Resource Strain: Not on file  Food Insecurity: No Food Insecurity (05/20/2023)   Hunger Vital Sign    Worried About Running Out of Food in the Last Year: Never true    Ran Out of Food in the Last Year: Never true  Transportation Needs: Unknown (05/20/2023)   PRAPARE - Administrator, Civil Service (Medical): No     Lack of Transportation (Non-Medical): Not on file  Physical Activity: Not on file  Stress: Not on file  Social Connections: Not on file      Family History  Problem Relation Age of Onset   Diabetes Father    Hypertension Father    Heart disease Father  Sudden death Father    Hyperlipidemia Mother    Hypertension Mother    Obesity Mother    Cancer Maternal Grandmother        lung   Cancer Maternal Grandfather        prostate      ROS:   Please see the history of present illness.     All other systems reviewed and are negative.   Labs/EKG Reviewed:    EKG:   EKG was ordered today.  The ekg ordered today demonstrates sinus rhythm with marked sinus arrhythmia, heart rate 70 bpm  Recent Labs: No results found for requested labs within last 365 days.   Recent Lipid Panel Lab Results  Component Value Date/Time   CHOL 141 05/20/2023 10:03 AM   TRIG 62 05/20/2023 10:03 AM   HDL 49 05/20/2023 10:03 AM   CHOLHDL 2.9 05/20/2023 10:03 AM   CHOLHDL 3.6 07/26/2017 02:50 AM   LDLCALC 79 05/20/2023 10:03 AM    Physical Exam:    VS:  BP 138/86 (BP Location: Left Arm, Patient Position: Sitting, Cuff Size: Normal)   Pulse 70   Ht 5\' 4"  (1.626 m)   Wt 243 lb (110.2 kg)   SpO2 91%   BMI 41.71 kg/m     Wt Readings from Last 3 Encounters:  05/20/23 243 lb (110.2 kg)  05/17/22 267 lb (121.1 kg)  11/06/21 254 lb 12.8 oz (115.6 kg)     GEN:  Well nourished, well developed in no acute distress HEENT: Normal NECK: No JVD; No carotid bruits LYMPHATICS: No lymphadenopathy CARDIAC: RRR, no murmurs, rubs, gallops RESPIRATORY:  Clear to auscultation without rales, wheezing or rhonchi  ABDOMEN: Soft, non-tender, non-distended MUSCULOSKELETAL:  No edema; No deformity  SKIN: Warm and dry NEUROLOGIC:  Alert and oriented x 3 PSYCHIATRIC:  Normal affect    Risk Assessment/Risk Calculators:                 ASSESSMENT & PLAN:    Coronary artery disease-she does not have  any anginal symptoms at this time.  She is currently on aspirin 81 mg daily which she did not stay on this medication but she conceives.  Atorvastatin 20 mg daily.  I discussed with the patient in length we will get her lipid profile and should she be at target with her LDL if she gets pregnant we will take her off of the Lipitor.  Given her secondary prevention we will have to discuss shared decision if she consider pravastatin for cardiovascular risk reduction during pregnancy.  Blood pressure is elevated in the office today.  Will start the patient on amlodipine 5 mg daily.  This medicine can continue if she is successful with conceiving.  Type 2 diabetes she is on Mounjaro and insulin.  Insulin would be the drug of choice for diabetes.  Pregnancy she would have to get off of Mounjaro.  Advised the patient to actively continue monitor herself and if she gets pregnant to stop the Medrol I definitely will stop the Lipitor.  We discussed that once she conceive she will be high risk for adverse pregnancy outcome but managing her pregnancy and having her baby is not contraindicated at this time.  All of her questions has been answered in the office today.  The patient understands the need to lose weight with diet and exercise. We have discussed specific strategies for this.  Will get a baseline echocardiogram to assess for any structural abnormality for optimization prior to conception.  Patient Instructions  Medication Instructions:   Start taking Amlodipine  5 mg daily   *If you need a refill on your cardiac medications before your next appointment, please call your pharmacy*   Lab Work:  Lipids   If you have labs (blood work) drawn today and your tests are completely normal, you will receive your results only by: MyChart Message (if you have MyChart) OR A paper copy in the mail If you have any lab test that is abnormal or we need to change your treatment, we will call you to review  the results.   Testing/Procedures: will be schedule at [facility] [address] Your physician has requested that you have an echocardiogram. Echocardiography is a painless test that uses sound waves to create images of your heart. It provides your doctor with information about the size and shape of your heart and how well your heart's chambers and valves are working. This procedure takes approximately one hour. There are no restrictions for this procedure. Please do NOT wear cologne, perfume, aftershave, or lotions (deodorant is allowed). Please arrive 15 minutes prior to your appointment time.    Follow-Up: At [facility], you and your health needs are our priority.  As part of our continuing mission to provide you with exceptional heart care, we have created designated Provider Care Teams.  These Care Teams include your primary Cardiologist (physician) and Advanced Practice Providers (APPs -  Physician Assistants and Nurse Practitioners) who all work together to provide you with the care you need, when you need it.  We recommend signing up for the patient portal called "MyChart".  Sign up information is provided on this After Visit Summary.  MyChart is used to connect with patients for Virtual Visits (Telemedicine).  Patients are able to view lab/test results, encounter notes, upcoming appointments, etc.  Non-urgent messages can be sent to your provider as well.   To learn more about what you can do with MyChart, go to [email or web address].    Your next appointment:   16 week(s)  The format for your next appointment:   In Person  Provider:   Thomasene Ripple, DO    Other Instructions    Purchase  Clear Blue Ovulation  KIT   Dispo:  No follow-ups on file.   Medication Adjustments/Labs and Tests Ordered: Current medicines are reviewed at length with the patient today.  Concerns regarding medicines are outlined above.  Tests Ordered: Orders Placed This Encounter   Procedures   Lipid panel   EKG 12-Lead   ECHOCARDIOGRAM COMPLETE   Medication Changes: Meds ordered this encounter  Medications   amLODipine (NORVASC) 5 MG tablet    Sig: Take 1 tablet (5 mg total) by mouth daily.    Dispense:  90 tablet    Refill:  3

## 2023-05-20 NOTE — Patient Instructions (Addendum)
Medication Instructions:   Start taking Amlodipine  5 mg daily   *If you need a refill on your cardiac medications before your next appointment, please call your pharmacy*   Lab Work:  Lipids   If you have labs (blood work) drawn today and your tests are completely normal, you will receive your results only by: MyChart Message (if you have MyChart) OR A paper copy in the mail If you have any lab test that is abnormal or we need to change your treatment, we will call you to review the results.   Testing/Procedures: will be schedule at [facility] [address] Your physician has requested that you have an echocardiogram. Echocardiography is a painless test that uses sound waves to create images of your heart. It provides your doctor with information about the size and shape of your heart and how well your heart's chambers and valves are working. This procedure takes approximately one hour. There are no restrictions for this procedure. Please do NOT wear cologne, perfume, aftershave, or lotions (deodorant is allowed). Please arrive 15 minutes prior to your appointment time.    Follow-Up: At [facility], you and your health needs are our priority.  As part of our continuing mission to provide you with exceptional heart care, we have created designated Provider Care Teams.  These Care Teams include your primary Cardiologist (physician) and Advanced Practice Providers (APPs -  Physician Assistants and Nurse Practitioners) who all work together to provide you with the care you need, when you need it.  We recommend signing up for the patient portal called "MyChart".  Sign up information is provided on this After Visit Summary.  MyChart is used to connect with patients for Virtual Visits (Telemedicine).  Patients are able to view lab/test results, encounter notes, upcoming appointments, etc.  Non-urgent messages can be sent to your provider as well.   To learn more about what you  can do with MyChart, go to [email or web address].    Your next appointment:   16 week(s)  The format for your next appointment:   In Person  Provider:   Thomasene Ripple, DO    Other Instructions    Purchase  [employer] Ovulation  KIT

## 2023-05-21 ENCOUNTER — Other Ambulatory Visit (HOSPITAL_COMMUNITY): Payer: Self-pay

## 2023-05-21 MED ORDER — MOUNJARO 15 MG/0.5ML ~~LOC~~ SOAJ: 15.0000 mg | SUBCUTANEOUS | 0 refills | Status: DC

## 2023-06-10 ENCOUNTER — Other Ambulatory Visit (HOSPITAL_COMMUNITY): Payer: Self-pay

## 2023-06-14 ENCOUNTER — Other Ambulatory Visit (HOSPITAL_COMMUNITY): Payer: BC Managed Care – PPO

## 2023-06-20 ENCOUNTER — Ambulatory Visit (HOSPITAL_COMMUNITY): Payer: BC Managed Care – PPO | Attending: Cardiology

## 2023-06-20 DIAGNOSIS — I2510 Atherosclerotic heart disease of native coronary artery without angina pectoris: Secondary | ICD-10-CM | POA: Diagnosis not present

## 2023-06-20 LAB — ECHOCARDIOGRAM COMPLETE
Area-P 1/2: 3.12 cm2
S' Lateral: 3.35 cm

## 2023-06-29 ENCOUNTER — Telehealth: Payer: Self-pay | Admitting: *Deleted

## 2023-06-29 ENCOUNTER — Ambulatory Visit: Payer: BC Managed Care – PPO | Attending: Cardiovascular Disease | Admitting: Cardiology

## 2023-06-29 ENCOUNTER — Telehealth: Payer: Self-pay | Admitting: Cardiology

## 2023-06-29 ENCOUNTER — Encounter: Payer: Self-pay | Admitting: Cardiology

## 2023-06-29 ENCOUNTER — Encounter: Payer: Self-pay | Admitting: *Deleted

## 2023-06-29 VITALS — BP 131/86 | HR 80 | Ht 64.0 in | Wt 239.0 lb

## 2023-06-29 DIAGNOSIS — I2510 Atherosclerotic heart disease of native coronary artery without angina pectoris: Secondary | ICD-10-CM | POA: Diagnosis not present

## 2023-06-29 DIAGNOSIS — Z794 Long term (current) use of insulin: Secondary | ICD-10-CM

## 2023-06-29 DIAGNOSIS — N926 Irregular menstruation, unspecified: Secondary | ICD-10-CM

## 2023-06-29 DIAGNOSIS — R931 Abnormal findings on diagnostic imaging of heart and coronary circulation: Secondary | ICD-10-CM

## 2023-06-29 DIAGNOSIS — E782 Mixed hyperlipidemia: Secondary | ICD-10-CM

## 2023-06-29 DIAGNOSIS — Z3169 Encounter for other general counseling and advice on procreation: Secondary | ICD-10-CM

## 2023-06-29 NOTE — Telephone Encounter (Signed)
Called patient to go over AVS and made f/u appointment on 07/08/23 at 0920.

## 2023-06-29 NOTE — Telephone Encounter (Signed)
Patient states it was a check in call and it has been completed.  Nothing further needed

## 2023-06-29 NOTE — Telephone Encounter (Signed)
Patient called stating she is returning RN Melissa's call.

## 2023-06-29 NOTE — Patient Instructions (Addendum)
Medication Instructions:  Your physician recommends that you continue on your current medications as directed. Please refer to the Current Medication list given to you today.  *If you need a refill on your cardiac medications before your next appointment, please call your pharmacy*  Lab Work: HCG Pregnancy test before office visit of 07/08/2023  If you have labs (blood work) drawn today and your tests are completely normal, you will receive your results only by: MyChart Message (if you have MyChart) OR A paper copy in the mail If you have any lab test that is abnormal or we need to change your treatment, we will call you to review the results.   Testing/Procedures: None   Follow-Up: At [facility], you and your health needs are our priority.  As part of our continuing mission to provide you with exceptional heart care, we have created designated Provider Care Teams.  These Care Teams include your primary Cardiologist (physician) and Advanced Practice Providers (APPs -  Physician Assistants and Nurse Practitioners) who all work together to provide you with the care you need, when you need it.  Your next appointment:   Friday, October 18th at 9:20 am    Provider:   Thomasene Ripple, DO

## 2023-06-30 NOTE — Progress Notes (Addendum)
Cardio-Obstetrics Clinic  Follow Up Note   Date:  06/30/2023   ID:  [name], DOB [birth year], MRN [id number]  PCP:  Melida Quitter, MD   [facility] Providers Cardiologist:  Thomasene Ripple, DO  Electrophysiologist:  None        Referring MD: Melida Quitter, MD   Chief Complaint: " I think I might be pregnant"  The patient is at home. I am in the office.  Virtual Visit via Video  Note . I connected with the patient today by a   video enabled telemedicine application and verified that I am speaking with the correct person using two identifiers.   History of Present Illness:    [name] is a 42 y.o. female [G1P0000] who returns for follow up of for preconception counseling.   Medical history includes diabetes mellitus type 2, obesity she is status post lap band in 2009, coronary artery disease status post inferior wall MI at the age of 11, status post CABG (LIMA to LAD, left radial to diagonal, SVG to sequential to PDA and PLb this was done in November 2018, hypertension, hyperlipidemia.   At her las visit we got an echocardiogram give hx of ischemic cardiomyopathy and need to understand Ef prior to pregnancy.   She is her today virtually to discuss her echo. She is understandably anxious about the echo result and the possibility that she may be pregnant.   She reports that she as missed her period and think she may be pregnant.   f these concurrent issues. The patient is also possibly pregnant, which could complicate potential interventions for her heart condition. The patient is scheduled for a blood test to confirm the pregnancy, and depending on the results, may need to undergo cardiac catheterization to further evaluate her heart condition.  Prior CV Studies Reviewed: The following studies were reviewed today: Reviewed echo  Past Medical History:  Diagnosis Date   Anxiety    Back pain    Chest pain    Coronary artery disease    Diabetes  mellitus    Difficulty swallowing pills    Fatigue    GERD (gastroesophageal reflux disease)    Hay fever    Headache    Heartburn    History of heart attack    HTN (hypertension)    Hyperlipidemia    Joint pain    Knee pain    Nervousness    Palpitations    Shortness of breath    Shortness of breath on exertion    Stress     Past Surgical History:  Procedure Laterality Date   CORONARY ARTERY BYPASS GRAFT N/A 08/02/2017   Procedure: CORONARY ARTERY BYPASS GRAFTING (CABG) X 5 (LIMA to LAD, LEFT RADIAL ARTERY to DIAGONAL, SVG to SEQUENTIALLY PDA and PLB) , USING LEFT INTERNAL MAMMARY ARTERY, LEFT RADIAL ARTERY, AND  SAPHENOUS VEIN to CIRCUMFLEX, RIGHT GREATER SAPHENOUS VEIN HARVESTED ENDOSCOPICALLY;  Surgeon: Delight Ovens, MD;  Location: [facility] OR;  Service: Open Heart Surgery;  Laterality: N/A;   CORONARY/GRAFT ACUTE MI REVASCULARIZATION N/A 07/24/2017   Procedure: Coronary/Graft Acute MI Revascularization;  Surgeon: Lyn Records, MD;  Location: [facility] INVASIVE CV LAB;  Service: Cardiovascular;  Laterality: N/A;   LAPAROSCOPIC GASTRIC BANDING  09/02/2008   LEFT HEART CATH AND CORONARY ANGIOGRAPHY N/A 07/24/2017   Procedure: LEFT HEART CATH AND CORONARY ANGIOGRAPHY;  Surgeon: Lyn Records, MD;  Location: [facility] INVASIVE CV LAB;  Service: Cardiovascular;  Laterality: N/A;  RADIAL ARTERY HARVEST Left 08/02/2017   Procedure: LEFT RADIAL ARTERY HARVEST;  Surgeon: Delight Ovens, MD;  Location: [facility] OR;  Service: Open Heart Surgery;  Laterality: Left;   STERNAL CLOSURE N/A 08/02/2017   Procedure: STERNAL PLATING;  Surgeon: Delight Ovens, MD;  Location: [facility] OR;  Service: Open Heart Surgery;  Laterality: N/A;   STOMACH SURGERY  1982   had metal removed from stomach as a child   TEE WITHOUT CARDIOVERSION N/A 08/02/2017   Procedure: TRANSESOPHAGEAL ECHOCARDIOGRAM (TEE);  Surgeon: Delight Ovens, MD;  Location: [facility] OR;  Service: Open Heart Surgery;  Laterality: N/A;      OB History      Gravida  1   Para  0   Term  0   Preterm  0   AB  0   Living  0      SAB  0   IAB  0   Ectopic  0   Multiple  0   Live Births  0               Current Medications: Current Meds  Medication Sig   acetaminophen (TYLENOL) 500 MG tablet Take 500 mg by mouth every 6 (six) hours as needed for mild pain or headache.   amLODipine (NORVASC) 5 MG tablet Take 1 tablet (5 mg total) by mouth daily.   aspirin EC 81 MG EC tablet Take 1 tablet (81 mg total) daily by mouth.   atorvastatin (LIPITOR) 20 MG tablet TAKE 1 TABLET(20 MG) BY MOUTH DAILY   BD PEN NEEDLE NANO 2ND GEN 32G X 4 MM MISC See admin instructions.   cholecalciferol (VITAMIN D3) 25 MCG (1000 UNIT) tablet Take 1,000 Units by mouth daily.   clobetasol (OLUX) 0.05 % topical foam Apply 1 application topically as directed.   Continuous Blood Gluc Sensor (FREESTYLE LIBRE 2 SENSOR) MISC    Prenatal 28-0.8 MG TABS Take 1 tablet by mouth daily.   Pyridoxine HCl (VITAMIN B6) 50 MG TABS Take 1 tablet by mouth daily.     Allergies:   Patient has no known allergies.   Social History   Socioeconomic History   Marital status: Married    Spouse name: Ichelle Schoof   Number of children: Not on file   Years of education: Not on file   Highest education level: Not on file  Occupational History   Occupation: Retail banker: [employer]  Tobacco Use   Smoking status: Never   Smokeless tobacco: Never  Vaping Use   Vaping status: Never Used  Substance and Sexual Activity   Alcohol use: Yes    Comment: occ   Drug use: No   Sexual activity: Not on file  Other Topics Concern   Not on file  Social History Narrative   Not on file   Social Determinants of Health   Financial Resource Strain: Not on file  Food Insecurity: No Food Insecurity (05/20/2023)   Hunger Vital Sign    Worried About Running Out of Food in the Last Year: Never true    Ran Out of Food in the Last Year: Never true   Transportation Needs: Unknown (05/20/2023)   PRAPARE - Administrator, Civil Service (Medical): No    Lack of Transportation (Non-Medical): Not on file  Physical Activity: Not on file  Stress: Not on file  Social Connections: Not on file      Family History  Problem Relation Age  of Onset   Diabetes Father    Hypertension Father    Heart disease Father    Sudden death Father    Hyperlipidemia Mother    Hypertension Mother    Obesity Mother    Cancer Maternal Grandmother        lung   Cancer Maternal Grandfather        prostate      ROS:   Please see the history of present illness.     All other systems reviewed and are negative.   Labs/EKG Reviewed:    EKG:   EKG was not ordered today.    Recent Labs: No results found for requested labs within last 365 days.   Recent Lipid Panel Lab Results  Component Value Date/Time   CHOL 141 05/20/2023 10:03 AM   TRIG 62 05/20/2023 10:03 AM   HDL 49 05/20/2023 10:03 AM   CHOLHDL 2.9 05/20/2023 10:03 AM   CHOLHDL 3.6 07/26/2017 02:50 AM   LDLCALC 79 05/20/2023 10:03 AM    Physical Exam:    VS:  BP 131/86 (BP Location: Left Arm, Patient Position: Sitting, Cuff Size: Normal)   Pulse 80   Ht 5\' 4"  (1.626 m)   Wt 239 lb (108.4 kg)   BMI 41.02 kg/m     Wt Readings from Last 3 Encounters:  06/29/23 239 lb (108.4 kg)  05/20/23 243 lb (110.2 kg)  05/17/22 267 lb (121.1 kg)     GEN:  Well nourished, well developed in no acute distress HEENT: Normal NECK: No JVD; No carotid bruits LYMPHATICS: No lymphadenopathy CARDIAC: RRR, no murmurs, rubs, gallops RESPIRATORY:  Clear to auscultation without rales, wheezing or rhonchi  ABDOMEN: Soft, non-tender, non-distended MUSCULOSKELETAL:  No edema; No deformity  SKIN: Warm and dry NEUROLOGIC:  Alert and oriented x 3 PSYCHIATRIC:  Normal affect    Risk Assessment/Risk Calculators:                 ASSESSMENT & PLAN:    Coronary Artery Disease Low normal  ejection fraction and abnormal wall motion on echocardiogram. Discussed the need for cardiac catheterization to assess for possible blockage and the implications of stenting on pregnancy (need for Plavix). -Order ACG blood test to confirm pregnancy status. -If not pregnant, schedule for cardiac catheterization. -If pregnant, manage conservatively and discuss risks. -If stenting is required, delay pregnancy for at least a month post-stenting to allow for safe discontinuation of Plavix. For now continue Aspirin  Pregnancy Possible early pregnancy with a faintly positive home pregnancy test. Discussed the implications of pregnancy on cardiac management. -Confirm pregnancy with ACG blood test. -If pregnant, manage cardiac condition conservatively and discuss risks. -If not pregnant, proceed with cardiac catheterization.  Follow-up Emotional distress noted. Reassured patient of ongoing support and joint decision-making. -Schedule follow-up appointment for October 18th to discuss blood work results and next steps.  Continue Lipitor for now if pregnancy confirmed will need to stop.   Continue amlodipine   Total time spent 25 minutes   Patient Instructions  Medication Instructions:  Your physician recommends that you continue on your current medications as directed. Please refer to the Current Medication list given to you today.  *If you need a refill on your cardiac medications before your next appointment, please call your pharmacy*  Lab Work: HCG Pregnancy test before office visit of 07/08/2023  If you have labs (blood work) drawn today and your tests are completely normal, you will receive your results only by: MyChart Message (if  you have MyChart) OR A paper copy in the mail If you have any lab test that is abnormal or we need to change your treatment, we will call you to review the results.   Testing/Procedures: None   Follow-Up: At [facility], you and your health  needs are our priority.  As part of our continuing mission to provide you with exceptional heart care, we have created designated Provider Care Teams.  These Care Teams include your primary Cardiologist (physician) and Advanced Practice Providers (APPs -  Physician Assistants and Nurse Practitioners) who all work together to provide you with the care you need, when you need it.  Your next appointment:   Friday, October 18th at 9:20 am    Provider:   Thomasene Ripple, DO       Dispo:  No follow-ups on file.   Medication Adjustments/Labs and Tests Ordered: Current medicines are reviewed at length with the patient today.  Concerns regarding medicines are outlined above.  Tests Ordered: Orders Placed This Encounter  Procedures   hCG, serum, qualitative   Medication Changes: No orders of the defined types were placed in this encounter.

## 2023-07-05 LAB — HCG, SERUM, QUALITATIVE: hCG,Beta Subunit,Qual,Serum: POSITIVE m[IU]/mL — AB (ref <6)

## 2023-07-05 LAB — SPECIMEN STATUS REPORT

## 2023-07-08 ENCOUNTER — Ambulatory Visit: Payer: BC Managed Care – PPO | Attending: Cardiology | Admitting: Cardiology

## 2023-07-08 ENCOUNTER — Encounter: Payer: Self-pay | Admitting: Cardiology

## 2023-07-08 VITALS — BP 124/90 | HR 92 | Ht 64.0 in | Wt 244.0 lb

## 2023-07-08 DIAGNOSIS — I2510 Atherosclerotic heart disease of native coronary artery without angina pectoris: Secondary | ICD-10-CM

## 2023-07-08 DIAGNOSIS — R5383 Other fatigue: Secondary | ICD-10-CM

## 2023-07-08 DIAGNOSIS — I10 Essential (primary) hypertension: Secondary | ICD-10-CM

## 2023-07-08 DIAGNOSIS — Z3201 Encounter for pregnancy test, result positive: Secondary | ICD-10-CM

## 2023-07-08 DIAGNOSIS — E782 Mixed hyperlipidemia: Secondary | ICD-10-CM | POA: Diagnosis not present

## 2023-07-08 MED ORDER — METOPROLOL TARTRATE 25 MG PO TABS: 25.0000 mg | ORAL_TABLET | Freq: Two times a day (BID) | ORAL | 3 refills | Status: DC

## 2023-07-08 NOTE — Progress Notes (Unsigned)
Cardio-Obstetrics Clinic  Follow Up Note   Date:  07/08/2023   ID:  [name], DOB 1981-07-15, MRN [id number]  PCP:  Holly Quitter, MD   [facility] Providers Cardiologist:  Holly Ripple, DO  Electrophysiologist:  None        Referring MD: Holly Quitter, MD   Chief Complaint: " I think I might be pregnant"  History of Present Illness:    [name] is a 42 y.o. female [G1P0000] who returns for follow up of for preconception counseling.   Medical history includes diabetes mellitus type 2, obesity she is status post lap band in 2009, coronary artery disease status post inferior wall MI at the age of 27, status post CABG (LIMA to LAD, left radial to diagonal, SVG to sequential to PDA and PLb this was done in November 2018, hypertension, hyperlipidemia.   At her las visit we got an echocardiogram give hx of ischemic cardiomyopathy and need to understand Ef prior to pregnancy.   At her recent visit with me she suspected that she may be pregnant as she had had a urine test that was faintly positive.  I sent her for the hCG pregnancy blood test.  During that time we discussed plans for potential future.  Giving her echocardiogram which showed wall motion abnormalities if she was not pregnant we will pursue a left heart catheterization.  But if she did we will just monitor her closely during pregnancy.  Her HCG blood pregnancy test was positive - she is pregnant.      Prior CV Studies Reviewed: The following studies were reviewed today: Reviewed echo  Past Medical History:  Diagnosis Date   Anxiety    Back pain    Chest pain    Coronary artery disease    Diabetes mellitus    Difficulty swallowing pills    Fatigue    GERD (gastroesophageal reflux disease)    Hay fever    Headache    Heartburn    History of heart attack    HTN (hypertension)    Hyperlipidemia    Joint pain    Knee pain    Nervousness    Palpitations    Shortness of breath     Shortness of breath on exertion    Stress     Past Surgical History:  Procedure Laterality Date   CORONARY ARTERY BYPASS GRAFT N/A 08/02/2017   Procedure: CORONARY ARTERY BYPASS GRAFTING (CABG) X 5 (LIMA to LAD, LEFT RADIAL ARTERY to DIAGONAL, SVG to SEQUENTIALLY PDA and PLB) , USING LEFT INTERNAL MAMMARY ARTERY, LEFT RADIAL ARTERY, AND  SAPHENOUS VEIN to CIRCUMFLEX, RIGHT GREATER SAPHENOUS VEIN HARVESTED ENDOSCOPICALLY;  Surgeon: Holly Ovens, MD;  Location: [facility] OR;  Service: Open Heart Surgery;  Laterality: N/A;   CORONARY/GRAFT ACUTE MI REVASCULARIZATION N/A 07/24/2017   Procedure: Coronary/Graft Acute MI Revascularization;  Surgeon: Holly Records, MD;  Location: [facility] INVASIVE CV LAB;  Service: Cardiovascular;  Laterality: N/A;   LAPAROSCOPIC GASTRIC BANDING  09/02/2008   LEFT HEART CATH AND CORONARY ANGIOGRAPHY N/A 07/24/2017   Procedure: LEFT HEART CATH AND CORONARY ANGIOGRAPHY;  Surgeon: Holly Records, MD;  Location: [facility] INVASIVE CV LAB;  Service: Cardiovascular;  Laterality: N/A;   RADIAL ARTERY HARVEST Left 08/02/2017   Procedure: LEFT RADIAL ARTERY HARVEST;  Surgeon: Holly Ovens, MD;  Location: [facility] OR;  Service: Open Heart Surgery;  Laterality: Left;   STERNAL CLOSURE N/A 08/02/2017   Procedure: STERNAL PLATING;  Surgeon:  Holly Ovens, MD;  Location: [facility] OR;  Service: Open Heart Surgery;  Laterality: N/A;   STOMACH SURGERY  1982   had metal removed from stomach as a child   TEE WITHOUT CARDIOVERSION N/A 08/02/2017   Procedure: TRANSESOPHAGEAL ECHOCARDIOGRAM (TEE);  Surgeon: Holly Ovens, MD;  Location: [facility] OR;  Service: Open Heart Surgery;  Laterality: N/A;      OB History     Gravida  1   Para  0   Term  0   Preterm  0   AB  0   Living  0      SAB  0   IAB  0   Ectopic  0   Multiple  0   Live Births  0               Current Medications: Current Meds  Medication Sig   acetaminophen (TYLENOL) 500 MG tablet Take 500 mg by mouth  every 6 (six) hours as needed for mild pain or headache.   amLODipine (NORVASC) 5 MG tablet Take 1 tablet (5 mg total) by mouth daily.   aspirin EC 81 MG EC tablet Take 1 tablet (81 mg total) daily by mouth.   atorvastatin (LIPITOR) 20 MG tablet TAKE 1 TABLET(20 MG) BY MOUTH DAILY   BD PEN NEEDLE NANO 2ND GEN 32G X 4 MM MISC See admin instructions.   cholecalciferol (VITAMIN D3) 25 MCG (1000 UNIT) tablet Take 1,000 Units by mouth daily.   clobetasol (OLUX) 0.05 % topical foam Apply 1 application topically as directed.   Continuous Blood Gluc Sensor (FREESTYLE LIBRE 2 SENSOR) MISC    insulin aspart (NOVOLOG FLEXPEN) 100 UNIT/ML FlexPen Take 3-5 units before each meal depending on the sugar range   NOVOLIN N FLEXPEN 100 UNIT/ML FlexPen Inject into the skin. Inject 8 units in the morning and 5 units in the evening   Prenatal 28-0.8 MG TABS Take 1 tablet by mouth daily.   Pyridoxine HCl (VITAMIN B6) 50 MG TABS Take 1 tablet by mouth daily.   tirzepatide (MOUNJARO) 10 MG/0.5ML Pen Inject 10 mg into the skin once a week.   tirzepatide (MOUNJARO) 12.5 MG/0.5ML Pen Inject 12.5 mg into the skin once a week.   tirzepatide (MOUNJARO) 15 MG/0.5ML Pen Inject 15 mg into the skin once a week.   Turmeric 500 MG CAPS See admin instructions.     Allergies:   Patient has no known allergies.   Social History   Socioeconomic History   Marital status: Married    Spouse name: [name]   Number of children: Not on file   Years of education: Not on file   Highest education level: Not on file  Occupational History   Occupation: Retail banker: [employer]  Tobacco Use   Smoking status: Never   Smokeless tobacco: Never  Vaping Use   Vaping status: Never Used  Substance and Sexual Activity   Alcohol use: Yes    Comment: occ   Drug use: No   Sexual activity: Not on file  Other Topics Concern   Not on file  Social History Narrative   Not on file   Social  Determinants of Health   Financial Resource Strain: Not on file  Food Insecurity: No Food Insecurity (05/20/2023)   Hunger Vital Sign    Worried About Running Out of Food in the Last Year: Never true    Ran Out of Food in the Last Year:  Never true  Transportation Needs: Unknown (05/20/2023)   PRAPARE - Administrator, Civil Service (Medical): No    Lack of Transportation (Non-Medical): Not on file  Physical Activity: Not on file  Stress: Not on file  Social Connections: Not on file      Family History  Problem Relation Age of Onset   Diabetes Father    Hypertension Father    Heart disease Father    Sudden death Father    Hyperlipidemia Mother    Hypertension Mother    Obesity Mother    Cancer Maternal Grandmother        lung   Cancer Maternal Grandfather        prostate      ROS:   Please see the history of present illness.     All other systems reviewed and are negative.   Labs/EKG Reviewed:    EKG:   EKG was not ordered today.    Recent Labs: No results found for requested labs within last 365 days.   Recent Lipid Panel Lab Results  Component Value Date/Time   CHOL 141 05/20/2023 10:03 AM   TRIG 62 05/20/2023 10:03 AM   HDL 49 05/20/2023 10:03 AM   CHOLHDL 2.9 05/20/2023 10:03 AM   CHOLHDL 3.6 07/26/2017 02:50 AM   LDLCALC 79 05/20/2023 10:03 AM    Physical Exam:    VS:  BP (!) 124/90 (BP Location: Right Arm, Patient Position: Sitting, Cuff Size: Large)   Pulse 92   Ht 5\' 4"  (1.626 m)   Wt 244 lb (110.7 kg)   SpO2 99%   BMI 41.88 kg/m     Wt Readings from Last 3 Encounters:  07/08/23 244 lb (110.7 kg)  06/29/23 239 lb (108.4 kg)  05/20/23 243 lb (110.2 kg)     GEN:  Well nourished, well developed in no acute distress HEENT: Normal NECK: No JVD; No carotid bruits LYMPHATICS: No lymphadenopathy CARDIAC: RRR, no murmurs, rubs, gallops RESPIRATORY:  Clear to auscultation without rales, wheezing or rhonchi  ABDOMEN: Soft,  non-tender, non-distended MUSCULOSKELETAL:  No edema; No deformity  SKIN: Warm and dry NEUROLOGIC:  Alert and oriented x 3 PSYCHIATRIC:  Normal affect    Risk Assessment/Risk Calculators:                 ASSESSMENT & PLAN:    Pregnancy in the setting of CAD Patient is currently pregnant and has a history of CAD. Discussed the risks and benefits of continuing statin therapy during pregnancy. Patient prefers to discontinue statin therapy during pregnancy. -Discontinue Atorvastatin. -Consider restarting statin therapy postpartum depending on breastfeeding status.  Patient reports occasional chest pain, possibly related to anxiety. Currently on Amlodipine and Metoprolol. -Continue Amlodipine. -Increase Metoprolol to twice daily to help manage angina. -Check CBC to assess iron levels. Continue Aspirin   CARPEG II Score = 8 points, 41 % primary cardiac event risk. WHO Class II-III, high risk pregnancy requiring frequent cardiology follow up.  She has not been seen by OB yet. I referred her to [facility] faculty practise as well as MFM.   Prenatal care Patient is currently taking prenatal vitamins and has a positive pregnancy test. -Continue prenatal vitamins. -Refer to OB and Maternal Fetal Medicine for high-risk pregnancy management. -Schedule follow-up in 4 weeks.    There are no Patient Instructions on file for this visit.   Dispo:  No follow-ups on file.   Medication Adjustments/Labs and Tests Ordered: Current medicines are reviewed  at length with the patient today.  Concerns regarding medicines are outlined above.  Tests Ordered: No orders of the defined types were placed in this encounter.  Medication Changes: No orders of the defined types were placed in this encounter.

## 2023-07-08 NOTE — Patient Instructions (Addendum)
Medication Instructions:  STOP atorvastatin   START metoprolol tartrate 25mg  twice daily  *If you need a refill on your cardiac medications before your next appointment, please call your pharmacy*   Lab Work: CBC today   If you have labs (blood work) drawn today and your tests are completely normal, you will receive your results only by: MyChart Message (if you have MyChart) OR A paper copy in the mail If you have any lab test that is abnormal or we need to change your treatment, we will call you to review the results.    Follow-Up: At [facility], you and your health needs are our priority.  As part of our continuing mission to provide you with exceptional heart care, we have created designated Provider Care Teams.  These Care Teams include your primary Cardiologist (physician) and Advanced Practice Providers (APPs -  Physician Assistants and Nurse Practitioners) who all work together to provide you with the care you need, when you need it.  We recommend signing up for the patient portal called "MyChart".  Sign up information is provided on this After Visit Summary.  MyChart is used to connect with patients for Virtual Visits (Telemedicine).  Patients are able to view lab/test results, encounter notes, upcoming appointments, etc.  Non-urgent messages can be sent to your provider as well.   To learn more about what you can do with MyChart, go to [email or web address].    Your next appointment:    4 weeks with Dr. Jodelle Red or Center or Women's  Other Instructions  You have been referred to Maternal Fetal Medicine and [facility]

## 2023-07-09 LAB — CBC
Hematocrit: 44.5 % (ref 34.0–46.6)
Hemoglobin: 14.4 g/dL (ref 11.1–15.9)
MCH: 27.8 pg (ref 26.6–33.0)
MCHC: 32.4 g/dL (ref 31.5–35.7)
MCV: 86 fL (ref 79–97)
Platelets: 185 10*3/uL (ref 150–450)
RBC: 5.18 x10E6/uL (ref 3.77–5.28)
RDW: 13.6 % (ref 11.7–15.4)
WBC: 7 10*3/uL (ref 3.4–10.8)

## 2023-08-01 ENCOUNTER — Ambulatory Visit: Payer: BC Managed Care – PPO | Attending: Cardiology | Admitting: Cardiology

## 2023-08-01 ENCOUNTER — Encounter: Payer: Self-pay | Admitting: Cardiology

## 2023-08-01 VITALS — BP 120/80 | HR 67 | Ht 64.0 in | Wt 248.8 lb

## 2023-08-01 DIAGNOSIS — I2510 Atherosclerotic heart disease of native coronary artery without angina pectoris: Secondary | ICD-10-CM

## 2023-08-01 DIAGNOSIS — O10919 Unspecified pre-existing hypertension complicating pregnancy, unspecified trimester: Secondary | ICD-10-CM

## 2023-08-01 DIAGNOSIS — O24911 Unspecified diabetes mellitus in pregnancy, first trimester: Secondary | ICD-10-CM | POA: Diagnosis not present

## 2023-08-01 DIAGNOSIS — O09521 Supervision of elderly multigravida, first trimester: Secondary | ICD-10-CM

## 2023-08-01 DIAGNOSIS — O0991 Supervision of high risk pregnancy, unspecified, first trimester: Secondary | ICD-10-CM

## 2023-08-01 DIAGNOSIS — Z3A08 8 weeks gestation of pregnancy: Secondary | ICD-10-CM

## 2023-08-01 NOTE — Patient Instructions (Addendum)
Follow-Up: At [facility], you and your health needs are our priority.  As part of our continuing mission to provide you with exceptional heart care, we have created designated Provider Care Teams.  These Care Teams include your primary Cardiologist (physician) and Advanced Practice Providers (APPs -  Physician Assistants and Nurse Practitioners) who all work together to provide you with the care you need, when you need it.  Your next appointment:   4 week(s)  Provider:   Thomasene Ripple, DO     Other Instructions Please see our pharm staff in 1-2 week DM in pregnancy. Thayer Ohm

## 2023-08-04 NOTE — Progress Notes (Signed)
Cardio-Obstetrics Clinic  Follow Up Note   Date:  08/04/2023   ID:  ELBERT SCHWANKE, DOB 27-Jul-1981, MRN [id number]  PCP:  Melida Quitter, MD   [facility] Providers Cardiologist:  Thomasene Ripple, DO  Electrophysiologist:  None        Referring MD: Melida Quitter, MD   Chief Complaint: " I think I might be pregnant"  History of Present Illness:    [name] is a 42 y.o. female [G1P0000] who returns for follow up of for preconception counseling.   Medical history includes diabetes mellitus type 2, obesity she is status post lap band in 2009, coronary artery disease status post inferior wall MI at the age of 66, status post CABG (LIMA to LAD, left radial to diagonal, SVG to sequential to PDA and PLb this was done in November 2018, hypertension, hyperlipidemia.    At her last visit a pregnancy blood test was positive. During that visit we stopped her atorvastatin.  We also talked about staying off statin if she decides to breast-feed.  During that visit shared decision the patient will transfer to [facility] practice.  Since her visit she has scheduled with [facility] practice.  Today she tells me that since I saw her she had had some burning chest pain.  But she notes that she had this after eating some bread with tomato/rotisserie chicken [place].  It had resolved.  But she is watching her fluids.   Prior CV Studies Reviewed: The following studies were reviewed today: Reviewed echo  Past Medical History:  Diagnosis Date   Anxiety    Back pain    Chest pain    Coronary artery disease    Diabetes mellitus    Difficulty swallowing pills    Fatigue    GERD (gastroesophageal reflux disease)    Hay fever    Headache    Heartburn    History of heart attack    HTN (hypertension)    Hyperlipidemia    Joint pain    Knee pain    Nervousness    Palpitations    Shortness of breath    Shortness of breath on exertion    Stress     Past Surgical History:   Procedure Laterality Date   CORONARY ARTERY BYPASS GRAFT N/A 08/02/2017   Procedure: CORONARY ARTERY BYPASS GRAFTING (CABG) X 5 (LIMA to LAD, LEFT RADIAL ARTERY to DIAGONAL, SVG to SEQUENTIALLY PDA and PLB) , USING LEFT INTERNAL MAMMARY ARTERY, LEFT RADIAL ARTERY, AND  SAPHENOUS VEIN to CIRCUMFLEX, RIGHT GREATER SAPHENOUS VEIN HARVESTED ENDOSCOPICALLY;  Surgeon: Delight Ovens, MD;  Location: [facility] OR;  Service: Open Heart Surgery;  Laterality: N/A;   CORONARY/GRAFT ACUTE MI REVASCULARIZATION N/A 07/24/2017   Procedure: Coronary/Graft Acute MI Revascularization;  Surgeon: Lyn Records, MD;  Location: [facility] INVASIVE CV LAB;  Service: Cardiovascular;  Laterality: N/A;   LAPAROSCOPIC GASTRIC BANDING  09/02/2008   LEFT HEART CATH AND CORONARY ANGIOGRAPHY N/A 07/24/2017   Procedure: LEFT HEART CATH AND CORONARY ANGIOGRAPHY;  Surgeon: Lyn Records, MD;  Location: [facility] INVASIVE CV LAB;  Service: Cardiovascular;  Laterality: N/A;   RADIAL ARTERY HARVEST Left 08/02/2017   Procedure: LEFT RADIAL ARTERY HARVEST;  Surgeon: Delight Ovens, MD;  Location: [facility] OR;  Service: Open Heart Surgery;  Laterality: Left;   STERNAL CLOSURE N/A 08/02/2017   Procedure: STERNAL PLATING;  Surgeon: Delight Ovens, MD;  Location: [facility] OR;  Service: Open Heart Surgery;  Laterality: N/A;  STOMACH SURGERY  1982   had metal removed from stomach as a child   TEE WITHOUT CARDIOVERSION N/A 08/02/2017   Procedure: TRANSESOPHAGEAL ECHOCARDIOGRAM (TEE);  Surgeon: Delight Ovens, MD;  Location: [facility] OR;  Service: Open Heart Surgery;  Laterality: N/A;      OB History     Gravida  1   Para  0   Term  0   Preterm  0   AB  0   Living  0      SAB  0   IAB  0   Ectopic  0   Multiple  0   Live Births  0               Current Medications: Current Meds  Medication Sig   acetaminophen (TYLENOL) 500 MG tablet Take 500 mg by mouth every 6 (six) hours as needed for mild pain or headache.   amLODipine  (NORVASC) 5 MG tablet Take 1 tablet (5 mg total) by mouth daily.   aspirin EC 81 MG EC tablet Take 1 tablet (81 mg total) daily by mouth.   BD PEN NEEDLE NANO 2ND GEN 32G X 4 MM MISC See admin instructions.   cholecalciferol (VITAMIN D3) 25 MCG (1000 UNIT) tablet Take 1,000 Units by mouth daily.   clobetasol (OLUX) 0.05 % topical foam Apply 1 application topically as directed.   Continuous Blood Gluc Sensor (FREESTYLE LIBRE 2 SENSOR) MISC    insulin aspart (NOVOLOG FLEXPEN) 100 UNIT/ML FlexPen Take 3-5 units before each meal depending on the sugar range   LANTUS SOLOSTAR 100 UNIT/ML Solostar Pen Inject into the skin. PATIENT USES 10 UNITS IN THE MORNING AND 10 UNITS AT NIGHT.   metoprolol tartrate (LOPRESSOR) 25 MG tablet Take 1 tablet (25 mg total) by mouth 2 (two) times daily.   Prenatal 28-0.8 MG TABS Take 1 tablet by mouth daily.   Pyridoxine HCl (VITAMIN B6) 50 MG TABS Take 1 tablet by mouth daily.   Turmeric 500 MG CAPS See admin instructions.     Allergies:   Patient has no known allergies.   Social History   Socioeconomic History   Marital status: Married    Spouse name: Ramneet Measel   Number of children: Not on file   Years of education: Not on file   Highest education level: Not on file  Occupational History   Occupation: Retail banker: [employer]  Tobacco Use   Smoking status: Never   Smokeless tobacco: Never  Vaping Use   Vaping status: Never Used  Substance and Sexual Activity   Alcohol use: Yes    Comment: occ   Drug use: No   Sexual activity: Not on file  Other Topics Concern   Not on file  Social History Narrative   Not on file   Social Determinants of Health   Financial Resource Strain: Not on file  Food Insecurity: No Food Insecurity (05/20/2023)   Hunger Vital Sign    Worried About Running Out of Food in the Last Year: Never true    Ran Out of Food in the Last Year: Never true  Transportation Needs: Unknown  (05/20/2023)   PRAPARE - Administrator, Civil Service (Medical): No    Lack of Transportation (Non-Medical): Not on file  Physical Activity: Not on file  Stress: Not on file  Social Connections: Not on file      Family History  Problem Relation Age of Onset  Diabetes Father    Hypertension Father    Heart disease Father    Sudden death Father    Hyperlipidemia Mother    Hypertension Mother    Obesity Mother    Cancer Maternal Grandmother        lung   Cancer Maternal Grandfather        prostate      ROS:   Please see the history of present illness.     All other systems reviewed and are negative.   Labs/EKG Reviewed:    EKG:   EKG was not ordered today.    Recent Labs: 07/08/2023: Hemoglobin 14.4; Platelets 185   Recent Lipid Panel Lab Results  Component Value Date/Time   CHOL 141 05/20/2023 10:03 AM   TRIG 62 05/20/2023 10:03 AM   HDL 49 05/20/2023 10:03 AM   CHOLHDL 2.9 05/20/2023 10:03 AM   CHOLHDL 3.6 07/26/2017 02:50 AM   LDLCALC 79 05/20/2023 10:03 AM    Physical Exam:    VS:  BP 120/80 (BP Location: Left Arm, Patient Position: Sitting, Cuff Size: Normal)   Pulse 67   Ht 5\' 4"  (1.626 m)   Wt 248 lb 12.8 oz (112.9 kg)   SpO2 97%   BMI 42.71 kg/m     Wt Readings from Last 3 Encounters:  08/01/23 248 lb 12.8 oz (112.9 kg)  07/08/23 244 lb (110.7 kg)  06/29/23 239 lb (108.4 kg)     GEN:  Well nourished, well developed in no acute distress HEENT: Normal NECK: No JVD; No carotid bruits LYMPHATICS: No lymphadenopathy CARDIAC: RRR, no murmurs, rubs, gallops RESPIRATORY:  Clear to auscultation without rales, wheezing or rhonchi  ABDOMEN: Soft, non-tender, non-distended MUSCULOSKELETAL:  No edema; No deformity  SKIN: Warm and dry NEUROLOGIC:  Alert and oriented x 3 PSYCHIATRIC:  Normal affect    Risk Assessment/Risk Calculators:                 ASSESSMENT & PLAN:    Pregnancy in the setting of CAD She did have some  chest discomfort recently but after discussing with the patient it appears to be related to acid reflux.  Will continue to monitor her closely. She has been off atorvastatin, we discussed at her last visit risk and benefit of keeping statin on given high risk ASCVD but patient declined.  Patient prefers to discontinue statin therapy during pregnancy. --Consider restarting statin therapy postpartum depending on breastfeeding status. Will closely monitor if needed will increase metoprolol if suspect angina. Currently on aspirin 81 mg daily, at 12 weeks will recommend increasing aspirin to 162 mg daily.   CARPEG II Score = 8 points, 41 % primary cardiac event risk. WHO Class II-III, high risk pregnancy requiring frequent cardiology follow up.  She has not been seen by OB yet.  She has a visit with our [facility] OB faculty practice, she will benefit from MFM care as well.  Prenatal care Continue with prenatal vitamins. Currently on aspirin 81 mg, will benefit from aspirin 162 mg at 12 weeks.  We think she is [redacted] weeks pregnant at this time.  Hypertensive disorder in pregnancy is with chronic hypertension affecting pregnancy. -Monitor blood pressure daily at home. -Report any blood pressure readings consistently over 130 for three days or any reading over 140 immediately. -Refer to pharmacist for management of diabetes during pregnancy.  Gastroesophageal Reflux Disease (GERD) Patient reports chest pain after eating certain foods, suggestive of reflux, which is common during pregnancy. -Avoid trigger foods  such as oily, saucy, or tomato-based foods.  Aspirin Therapy Patient is currently on low-dose aspirin therapy. -Increase aspirin dose to 162mg  daily after 12 weeks of pregnancy.  -Plan for more frequent visits as pregnancy progresses (every 4 weeks initially, then every 2 weeks closer to term).   Patient Instructions  Follow-Up: At [facility], you and your health needs are our  priority.  As part of our continuing mission to provide you with exceptional heart care, we have created designated Provider Care Teams.  These Care Teams include your primary Cardiologist (physician) and Advanced Practice Providers (APPs -  Physician Assistants and Nurse Practitioners) who all work together to provide you with the care you need, when you need it.  Your next appointment:   4 week(s)  Provider:   Thomasene Ripple, DO     Other Instructions Please see our pharm staff in 1-2 week DM in pregnancy. Thayer Ohm   Dispo:  No follow-ups on file.   Medication Adjustments/Labs and Tests Ordered: Current medicines are reviewed at length with the patient today.  Concerns regarding medicines are outlined above.  Tests Ordered: Orders Placed This Encounter  Procedures   AMB Referral to [facility]   Medication Changes: No orders of the defined types were placed in this encounter.

## 2023-08-09 ENCOUNTER — Telehealth: Payer: BC Managed Care – PPO

## 2023-08-09 VITALS — BP 114/72

## 2023-08-09 DIAGNOSIS — Z3A10 10 weeks gestation of pregnancy: Secondary | ICD-10-CM

## 2023-08-09 DIAGNOSIS — O0991 Supervision of high risk pregnancy, unspecified, first trimester: Secondary | ICD-10-CM | POA: Diagnosis not present

## 2023-08-09 DIAGNOSIS — O0990 Supervision of high risk pregnancy, unspecified, unspecified trimester: Secondary | ICD-10-CM | POA: Insufficient documentation

## 2023-08-09 NOTE — Progress Notes (Signed)
Patient is a G2P0, history of 9w SAB in March of 2023.

## 2023-08-09 NOTE — Progress Notes (Signed)
New OB Intake  I connected with [name]  on 08/09/23 at  8:15 AM EST by MyChart Video Visit and verified that I am speaking with the correct person using two identifiers. Nurse is located at [facility] and pt is located at home.  I discussed the limitations, risks, security and privacy concerns of performing an evaluation and management service by telephone and the availability of in person appointments. I also discussed with the patient that there may be a patient responsible charge related to this service. The patient expressed understanding and agreed to proceed.  I explained I am completing New OB Intake today. We discussed EDD of Not found.. Pt is G1P0000. I reviewed her allergies, medications and Medical/Surgical/OB history.    Patient Active Problem List   Diagnosis Date Noted   High-risk pregnancy 11/05/2021   History of gastric surgery 09/03/2019   Type 2 diabetes mellitus without complication, without long-term current use of insulin (HCC) 09/03/2019   Prediabetes 12/21/2018   Vitamin D deficiency 11/20/2018   Essential hypertension 09/05/2018   Class 3 severe obesity with serious comorbidity and body mass index (BMI) of 40.0 to 44.9 in adult (HCC) 07/13/2018   Pure hypercholesterolemia with target low density lipoprotein (LDL) cholesterol less than 70 mg/dL 60/45/4098   Coronary artery disease involving coronary bypass graft of native heart with angina pectoris (HCC) 08/02/2017   History of ST elevation myocardial infarction 07/24/2017   Type 2 diabetes mellitus with obesity (HCC) 07/24/2017   Morbid obesity (HCC) 07/24/2017   Heart attack at 42 years old; 2018 Concerns addressed today  Delivery Plans Plans to deliver at [facility]. Discussed the nature of our practice with multiple providers including residents and students. Due to the size of the practice, the delivering provider may not be the same as those providing prenatal care.   Patient is not interested in  water birth. Offered upcoming OB visit with CNM to discuss further.  MyChart/Babyscripts MyChart access verified. I explained pt will have some visits in office and some virtually. Babyscripts instructions given and order placed. Patient verifies receipt of registration text/e-mail. Account successfully created and app downloaded.  Blood Pressure Cuff/Weight Scale Patient has private insurance; instructed to purchase blood pressure cuff and bring to first prenatal appt. Explained after first prenatal appt pt will check weekly and document in Babyscripts. Patient does have weight scale.  Anatomy US Explained first scheduled [name] will be around 19 weeks. Anatomy US scheduled for 10/10/23 at 0915.  Is patient a CenteringPregnancy candidate?  Not a Candidate Declined due to  N/A  Not a candidate due to [facility], medication controlled If accepted,    Is patient a Mom+Baby Combined Care candidate?  Not a candidate   If accepted, confirm patient does not intend to move from the area for at least 12 months, then notify Mom+Baby staff  Interested in [place]? If yes, send referral and doula dot phrase.   Is patient a candidate for Babyscripts Optimization? No - high risk pregnancy r/t multiple co-morbidities  First visit review I reviewed new OB appt with patient. Explained pt will be seen by Dr. Macon Large at first visit on 08/17/23 at 0955. Discussed Avelina Laine genetic screening with patient. Patient interested in both [place] and [place].. Routine prenatal labs  to be collected at new OB visit     Last Pap Patient reports last PAP smear done in May 2024 at [facility] with Dr. Lidia Collum need ROI for records.   Meryl Crutch, RN  08/09/2023  8:15 AM

## 2023-08-17 ENCOUNTER — Encounter: Payer: Self-pay | Admitting: Obstetrics & Gynecology

## 2023-08-17 ENCOUNTER — Other Ambulatory Visit (HOSPITAL_COMMUNITY)
Admission: RE | Admit: 2023-08-17 | Discharge: 2023-08-17 | Disposition: A | Discharge status: Home / Self Care | Payer: BC Managed Care – PPO | Source: Ambulatory Visit | Attending: Obstetrics & Gynecology | Admitting: Obstetrics & Gynecology

## 2023-08-17 ENCOUNTER — Ambulatory Visit: Payer: BC Managed Care – PPO | Admitting: Obstetrics & Gynecology

## 2023-08-17 ENCOUNTER — Other Ambulatory Visit: Payer: Self-pay

## 2023-08-17 VITALS — BP 134/87 | HR 81 | Wt 243.0 lb

## 2023-08-17 DIAGNOSIS — E1169 Type 2 diabetes mellitus with other specified complication: Secondary | ICD-10-CM | POA: Insufficient documentation

## 2023-08-17 DIAGNOSIS — O10911 Unspecified pre-existing hypertension complicating pregnancy, first trimester: Secondary | ICD-10-CM

## 2023-08-17 DIAGNOSIS — O09529 Supervision of elderly multigravida, unspecified trimester: Secondary | ICD-10-CM

## 2023-08-17 DIAGNOSIS — O24111 Pre-existing diabetes mellitus, type 2, in pregnancy, first trimester: Secondary | ICD-10-CM | POA: Insufficient documentation

## 2023-08-17 DIAGNOSIS — O0991 Supervision of high risk pregnancy, unspecified, first trimester: Secondary | ICD-10-CM | POA: Diagnosis not present

## 2023-08-17 DIAGNOSIS — O99211 Obesity complicating pregnancy, first trimester: Secondary | ICD-10-CM | POA: Insufficient documentation

## 2023-08-17 DIAGNOSIS — E669 Obesity, unspecified: Secondary | ICD-10-CM | POA: Insufficient documentation

## 2023-08-17 DIAGNOSIS — I252 Old myocardial infarction: Secondary | ICD-10-CM

## 2023-08-17 DIAGNOSIS — O0990 Supervision of high risk pregnancy, unspecified, unspecified trimester: Secondary | ICD-10-CM

## 2023-08-17 DIAGNOSIS — O24319 Unspecified pre-existing diabetes mellitus in pregnancy, unspecified trimester: Secondary | ICD-10-CM

## 2023-08-17 DIAGNOSIS — O24311 Unspecified pre-existing diabetes mellitus in pregnancy, first trimester: Secondary | ICD-10-CM | POA: Diagnosis not present

## 2023-08-17 DIAGNOSIS — O10919 Unspecified pre-existing hypertension complicating pregnancy, unspecified trimester: Secondary | ICD-10-CM

## 2023-08-17 DIAGNOSIS — O09521 Supervision of elderly multigravida, first trimester: Secondary | ICD-10-CM

## 2023-08-17 DIAGNOSIS — Z3A11 11 weeks gestation of pregnancy: Secondary | ICD-10-CM | POA: Insufficient documentation

## 2023-08-17 DIAGNOSIS — F419 Anxiety disorder, unspecified: Secondary | ICD-10-CM

## 2023-08-17 DIAGNOSIS — I10 Essential (primary) hypertension: Secondary | ICD-10-CM

## 2023-08-17 MED ORDER — ASPIRIN 81 MG PO TBEC: 162.0000 mg | DELAYED_RELEASE_TABLET | Freq: Every day | ORAL | 2 refills | Status: AC

## 2023-08-17 NOTE — Progress Notes (Signed)
Informal bedside ultrasound performed to assess FHR, FHR 173bpm

## 2023-08-17 NOTE — Patient Instructions (Addendum)
[facility] Address: [address] [place], [place] [address] Phone: [phone] Bring Insurance Card & Drivers License December 18th 3pm

## 2023-08-17 NOTE — Progress Notes (Signed)
History:   [name] is a 42 y.o. G2P0010 at [redacted]w[redacted]d by LMP being seen today for her first obstetrical visit.  Her obstetrical history is significant for  SAB x 1 at [redacted] weeks gestation . Her history is remarkable for coronary artery disease status post inferior wall MI at the age of 12, status post CABG (LIMA to LAD, left radial to diagonal, SVG to sequential to PDA and PLb this was done in November 2018.  She also has Type 2 diabetes mellitus, morbid obesity she is status post lap band in 2009, hypertension, hyperlipidemia and anxiety. Patient does intend to breast feed. Here with FOB. Pregnancy history fully reviewed.  Patient reports no complaints.      HISTORY: OB History  Gravida Para Term Preterm AB Living  2 0 0 0 1 0  SAB IAB Ectopic Multiple Live Births  1 0 0 0 0    # Outcome Date GA Lbr Len/2nd Weight Sex Type Anes PTL Lv  2 Current           1 SAB 11/2021 [redacted]w[redacted]d           Last pap smear was done 01/2023 at [facility] and was normal  Past Medical History:  Diagnosis Date   Anxiety    Back pain    Chest pain    Coronary artery disease    Coronary artery disease involving coronary bypass graft of native heart with angina pectoris (HCC) 08/02/2017   Diabetes mellitus    Difficulty swallowing pills    Fatigue    GERD (gastroesophageal reflux disease)    Gout    Hay fever    Headache    Heartburn    History of heart attack    HTN (hypertension)    Hyperlipidemia    Joint pain    Knee pain    Nervousness    Palpitations    Pure hypercholesterolemia with target low density lipoprotein (LDL) cholesterol less than 70 mg/dL 16/06/9603   Shortness of breath on exertion    Vitamin D deficiency 11/20/2018   Past Surgical History:  Procedure Laterality Date   CORONARY ARTERY BYPASS GRAFT N/A 08/02/2017   Procedure: CORONARY ARTERY BYPASS GRAFTING (CABG) X 5 (LIMA to LAD, LEFT RADIAL ARTERY to DIAGONAL, SVG to SEQUENTIALLY PDA and PLB) , USING LEFT INTERNAL  MAMMARY ARTERY, LEFT RADIAL ARTERY, AND  SAPHENOUS VEIN to CIRCUMFLEX, RIGHT GREATER SAPHENOUS VEIN HARVESTED ENDOSCOPICALLY;  Surgeon: Delight Ovens, MD;  Location: [facility] OR;  Service: Open Heart Surgery;  Laterality: N/A;   CORONARY/GRAFT ACUTE MI REVASCULARIZATION N/A 07/24/2017   Procedure: Coronary/Graft Acute MI Revascularization;  Surgeon: Lyn Records, MD;  Location: [facility] INVASIVE CV LAB;  Service: Cardiovascular;  Laterality: N/A;   LAPAROSCOPIC GASTRIC BANDING  09/02/2008   LEFT HEART CATH AND CORONARY ANGIOGRAPHY N/A 07/24/2017   Procedure: LEFT HEART CATH AND CORONARY ANGIOGRAPHY;  Surgeon: Lyn Records, MD;  Location: [facility] INVASIVE CV LAB;  Service: Cardiovascular;  Laterality: N/A;   RADIAL ARTERY HARVEST Left 08/02/2017   Procedure: LEFT RADIAL ARTERY HARVEST;  Surgeon: Delight Ovens, MD;  Location: [facility] OR;  Service: Open Heart Surgery;  Laterality: Left;   STERNAL CLOSURE N/A 08/02/2017   Procedure: STERNAL PLATING;  Surgeon: Delight Ovens, MD;  Location: [facility] OR;  Service: Open Heart Surgery;  Laterality: N/A;   STOMACH SURGERY  1982   had metal removed from stomach as a child   TEE WITHOUT CARDIOVERSION N/A 08/02/2017  Procedure: TRANSESOPHAGEAL ECHOCARDIOGRAM (TEE);  Surgeon: Delight Ovens, MD;  Location: [facility] OR;  Service: Open Heart Surgery;  Laterality: N/A;   Family History  Problem Relation Age of Onset   Hyperlipidemia Mother    Hypertension Mother    Obesity Mother    Diabetes Father    Hypertension Father    Heart disease Father    Sudden death Father 83       died of MI   Cancer Maternal Grandmother        lung   Cancer Maternal Grandfather        prostate   Social History   Tobacco Use   Smoking status: Never   Smokeless tobacco: Never  Vaping Use   Vaping status: Never Used  Substance Use Topics   Alcohol use: Not Currently    Comment: occ   Drug use: No   No Known Allergies Current Outpatient Medications on File Prior to Visit   Medication Sig Dispense Refill   amLODipine (NORVASC) 5 MG tablet Take 1 tablet (5 mg total) by mouth daily. 90 tablet 3   BD PEN NEEDLE NANO 2ND GEN 32G X 4 MM MISC See admin instructions.     cholecalciferol (VITAMIN D3) 25 MCG (1000 UNIT) tablet Take 1,000 Units by mouth daily.     clobetasol (OLUX) 0.05 % topical foam Apply 1 application topically as directed. 50 g 3   insulin aspart (NOVOLOG FLEXPEN) 100 UNIT/ML FlexPen Take 3-5 units before each meal depending on the sugar range     LANTUS SOLOSTAR 100 UNIT/ML Solostar Pen Inject into the skin. PATIENT USES 12 UNITS IN THE MORNING AND 12 UNITS AT NIGHT.     metoprolol tartrate (LOPRESSOR) 25 MG tablet Take 1 tablet (25 mg total) by mouth 2 (two) times daily. 180 tablet 3   polycarbophil (FIBERCON) 625 MG tablet Take 625 mg by mouth daily.     Prenatal 28-0.8 MG TABS Take 1 tablet by mouth daily.     Turmeric 500 MG CAPS See admin instructions.     vitamin B-12 (CYANOCOBALAMIN) 100 MCG tablet Take 100 mcg by mouth daily.     acetaminophen (TYLENOL) 500 MG tablet Take 500 mg by mouth every 6 (six) hours as needed for mild pain (pain score 1-3) or headache. (Patient not taking: Reported on 08/17/2023)     No current facility-administered medications on file prior to visit.    Review of Systems Pertinent items noted in HPI and remainder of comprehensive ROS otherwise negative.   Physical Exam:   Vitals:   08/17/23 1011  BP: 134/87  Pulse: 81  Weight: 243 lb (110.2 kg)   Fetal Heart Rate (bpm): 173  on ultrasound for FHR check Patient informed that the ultrasound is considered a limited obstetric ultrasound and is not intended to be a complete ultrasound exam.  Patient also informed that the ultrasound is not being completed with the intent of assessing for fetal or placental anomalies or any pelvic abnormalities.  Explained that the purpose of today's ultrasound is to assess for fetal heart rate.  Patient acknowledges the purpose of  the exam and the limitations of the study. General: well-developed, well-nourished female in no acute distress  Breasts:  deferred  Skin: normal coloration and turgor, no rashes  Neurologic: oriented, normal, negative, normal mood  Extremities: normal strength, tone, and muscle mass, ROM of all joints is normal  HEENT PERRLA, extraocular movement intact and sclera clear, anicteric  Neck supple and no  masses  Cardiovascular: regular rate and rhythm  Respiratory:  no respiratory distress, normal breath sounds  Abdomen: soft, non-tender; bowel sounds normal; no masses,  no organomegaly  Pelvic: deferred    Assessment:    Pregnancy: G2P0010 Patient Active Problem List   Diagnosis Date Noted   Preexisting diabetes complicating pregnancy, antepartum 08/17/2023   Preexisting hypertension complicating pregnancy, antepartum 08/17/2023   AMA (advanced maternal age) 75+ 08/17/2023   Anxiety    Supervision of high risk pregnancy, antepartum 08/09/2023   History of gastric surgery 09/03/2019   Essential hypertension 09/05/2018   Maternal morbid obesity, antepartum (HCC) 07/13/2018   History of ST elevation myocardial infarction 07/24/2017   Type 2 diabetes mellitus with obesity (HCC) 07/24/2017     Plan:    1. History of ST elevation myocardial infarction 2. Preexisting hypertension complicating pregnancy, antepartum 3. Essential hypertension Already followed by Dr. Servando Salina.  On Amlodipine. Discussed implications of CHTN in pregnancy, need for antenatal testing and frequent ultrasounds/prenatal visits, need for optimizing BP control to decrease CHTN/preeclampsia associated maternal-fetal morbidity and  - AMB Referral to Cardio Obstetrics  4. Preexisting diabetes complicating pregnancy, antepartum 5. Type 2 diabetes mellitus with obesity (HCC) Discussed implications of DM in pregnancy, need for optimizing glycemic control to decrease DM associated maternal-fetal morbidity and mortality, need  for antenatal testing and frequent ultrasounds/prenatal visits. Will check baseline labs today, get DM education done.  Already has Dexcom and on Lantus and Novolog, will titrate as needed.   - CBC/D/Plt+RPR+Rh+ABO+RubIgG... - Culture, OB Urine - Hemoglobin A1c - HORIZON CUSTOM - PANORAMA PRENATAL TEST - GC/Chlamydia probe amp ([place])not at [facility] - Comp Met (CMET) - Protein / creatinine ratio, urine - TSH Rfx on Abnormal to Free T4 - US Fetal Echocardiography; Future - Ambulatory referral to Pediatric Cardiology - aspirin EC 81 MG tablet; Take 2 tablets (162 mg total) by mouth daily.  Dispense: 300 tablet; Refill: 2 - AMB Referral to Cardio Obstetrics  6. Maternal morbid obesity, antepartum (HCC) TWG 11-20 lb recommended. - AMB Referral to Cardio Obstetrics  7. Antepartum multigravida of advanced maternal age NIPS being done today.  - AMB Referral to Cardio Obstetrics  8. Anxiety Stable, no medications.    9. [redacted] weeks gestation of pregnancy 10. Supervision of high risk pregnancy, antepartum  Initial labs drawn. Continue prenatal vitamins. Problem list reviewed and updated. Genetic Screening discussed, Panorama and Horizon: ordered. Ultrasound discussed; fetal anatomic survey: scheduled. Anticipatory guidance about prenatal visits given including labs, ultrasounds, and testing. Weight gain recommendations per IOM guidelines reviewed: underweight/BMI 18.5 or less > 28 - 40 lbs; normal weight/BMI 18.5 - 24.9 > 25 - 35 lbs; overweight/BMI 25 - 29.9 > 15 - 25 lbs; obese/BMI  30 or more > 11 - 20 lbs. Discussed usage of the Babyscripts app for more information about pregnancy, and to track blood pressures. Also discussed usage of virtual visits as additional source of managing and completing prenatal visits.  Patient was encouraged to use MyChart to review results, send requests, and have questions addressed.   The nature of [facility] with  multiple MDs and Advanced Practice Providers was explained to patient; also emphasized that residents, students are part of our team. Routine obstetric precautions reviewed. Encouraged to seek out care at our office or emergency room [facility] preferred) for urgent and/or emergent concerns. Return in about 4 weeks (around 09/14/2023) for OFFICE OB VISIT (MD only) at [facility] (message sent to office).  Jaynie Collins, MD, FACOG Obstetrician & Gynecologist, [facility] for [employer], [facility]

## 2023-08-17 NOTE — Addendum Note (Signed)
Addended byVidal Schwalbe on: 08/17/2023 01:41 PM   Modules accepted: Orders

## 2023-08-19 LAB — PROTEIN / CREATININE RATIO, URINE
Creatinine, Urine: 211.2 mg/dL
Protein, Ur: 18.8 mg/dL
Protein/Creat Ratio: 89 mg/g{creat} (ref 0–200)

## 2023-08-19 LAB — GC/CHLAMYDIA PROBE AMP (~~LOC~~) NOT AT ARMC
Chlamydia: NEGATIVE
Comment: NEGATIVE
Comment: NORMAL
Neisseria Gonorrhea: NEGATIVE

## 2023-08-19 LAB — CULTURE, OB URINE

## 2023-08-19 LAB — URINE CULTURE, OB REFLEX

## 2023-08-22 ENCOUNTER — Other Ambulatory Visit: Payer: Self-pay | Admitting: Nurse Practitioner

## 2023-08-22 ENCOUNTER — Other Ambulatory Visit: Payer: BC Managed Care – PPO

## 2023-08-22 DIAGNOSIS — E042 Nontoxic multinodular goiter: Secondary | ICD-10-CM

## 2023-08-22 DIAGNOSIS — E1169 Type 2 diabetes mellitus with other specified complication: Secondary | ICD-10-CM

## 2023-08-22 DIAGNOSIS — O0990 Supervision of high risk pregnancy, unspecified, unspecified trimester: Secondary | ICD-10-CM

## 2023-08-23 ENCOUNTER — Encounter: Payer: Self-pay | Admitting: Obstetrics & Gynecology

## 2023-08-23 LAB — COMPREHENSIVE METABOLIC PANEL
ALT: 6 [IU]/L (ref 0–32)
AST: 16 [IU]/L (ref 0–40)
Albumin: 3.7 g/dL — ABNORMAL LOW (ref 3.9–4.9)
Alkaline Phosphatase: 63 [IU]/L (ref 44–121)
BUN/Creatinine Ratio: 14 (ref 9–23)
BUN: 11 mg/dL (ref 6–24)
Bilirubin Total: 0.5 mg/dL (ref 0.0–1.2)
CO2: 16 mmol/L — ABNORMAL LOW (ref 20–29)
Calcium: 9 mg/dL (ref 8.7–10.2)
Chloride: 100 mmol/L (ref 96–106)
Creatinine, Ser: 0.79 mg/dL (ref 0.57–1.00)
Globulin, Total: 2.7 g/dL (ref 1.5–4.5)
Glucose: 84 mg/dL (ref 70–99)
Potassium: 4.2 mmol/L (ref 3.5–5.2)
Sodium: 134 mmol/L (ref 134–144)
Total Protein: 6.4 g/dL (ref 6.0–8.5)
eGFR: 95 mL/min/{1.73_m2} (ref >59)

## 2023-08-23 LAB — CBC/D/PLT+RPR+RH+ABO+RUBIGG...
Antibody Screen: NEGATIVE
Basophils Absolute: 0 10*3/uL (ref 0.0–0.2)
Basos: 0 %
EOS (ABSOLUTE): 0.1 10*3/uL (ref 0.0–0.4)
Eos: 1 %
HCV Ab: NONREACTIVE
HIV Screen 4th Generation wRfx: NONREACTIVE
Hematocrit: 43.4 % (ref 34.0–46.6)
Hemoglobin: 14 g/dL (ref 11.1–15.9)
Hepatitis B Surface Ag: NEGATIVE
Immature Grans (Abs): 0 10*3/uL (ref 0.0–0.1)
Immature Granulocytes: 0 %
Lymphocytes Absolute: 2 10*3/uL (ref 0.7–3.1)
Lymphs: 21 %
MCH: 28 pg (ref 26.6–33.0)
MCHC: 32.3 g/dL (ref 31.5–35.7)
MCV: 87 fL (ref 79–97)
Monocytes Absolute: 0.7 10*3/uL (ref 0.1–0.9)
Monocytes: 8 %
Neutrophils Absolute: 6.4 10*3/uL (ref 1.4–7.0)
Neutrophils: 70 %
Platelets: 222 10*3/uL (ref 150–450)
RBC: 5 x10E6/uL (ref 3.77–5.28)
RDW: 13.3 % (ref 11.7–15.4)
RPR Ser Ql: NONREACTIVE
Rh Factor: POSITIVE
Rubella Antibodies, IGG: 6.04 {index} (ref >0.99)
WBC: 9.3 10*3/uL (ref 3.4–10.8)

## 2023-08-23 LAB — HEMOGLOBIN A1C
Est. average glucose Bld gHb Est-mCnc: 148 mg/dL
Hgb A1c MFr Bld: 6.8 % — ABNORMAL HIGH (ref 4.8–5.6)

## 2023-08-23 LAB — HCV INTERPRETATION

## 2023-08-23 LAB — TSH RFX ON ABNORMAL TO FREE T4: TSH: 0.07 u[IU]/mL — ABNORMAL LOW (ref 0.450–4.500)

## 2023-08-23 LAB — T4F: T4,Free (Direct): 1.62 ng/dL (ref 0.82–1.77)

## 2023-08-25 ENCOUNTER — Other Ambulatory Visit: Payer: BC Managed Care – PPO

## 2023-08-29 ENCOUNTER — Other Ambulatory Visit: Payer: Self-pay | Admitting: *Deleted

## 2023-08-29 DIAGNOSIS — O24319 Unspecified pre-existing diabetes mellitus in pregnancy, unspecified trimester: Secondary | ICD-10-CM

## 2023-08-30 ENCOUNTER — Encounter: Payer: Self-pay | Admitting: Dietician

## 2023-08-30 ENCOUNTER — Encounter: Payer: BC Managed Care – PPO | Attending: Obstetrics & Gynecology | Admitting: Dietician

## 2023-08-30 DIAGNOSIS — O24319 Unspecified pre-existing diabetes mellitus in pregnancy, unspecified trimester: Secondary | ICD-10-CM | POA: Insufficient documentation

## 2023-08-30 LAB — PANORAMA PRENATAL TEST FULL PANEL:PANORAMA TEST PLUS 5 ADDITIONAL MICRODELETIONS: FETAL FRACTION: 6.6

## 2023-08-30 NOTE — Patient Instructions (Signed)
Goals Established by Patient:  Walk on the treadmill 3 days a week for 30 minutes.   At meals include 1/4 plate complex carbs, 1/4 plate protein, 1/2 plate non-starchy vegetables.   If blood glucose goes above range, do 10-15 minutes of exercise.

## 2023-08-30 NOTE — Progress Notes (Signed)
Patient was seen for Pre-existing Diabetes in Pregnancy self-management on 08/30/23.    Estimated due date: 03/02/24; [redacted]w[redacted]d  Clinical: Medications: lantus (12 am and 12 pm), novolog (4, 6, or 8) Supplements: vitamin b12, turmeric, prenatal, vitamin d3 Medical History: anxiety, type 2 diabetes, GERD, HLD, HTN Labs: A1c 6.8% 08/22/23  Dietary and Lifestyle History:  Dexcom G7 CGM (checks multiple times a day)  Pt states she is concerned about her blood sugar. She states if she eats anything other than meat, veggies, and beans it goes too high. Pt states around November 22nd she aimed to cut out most carb sources (has some beans and fruit) because of this. Pt states since then she has noticed some weight loss over the last few weeks since cutting them and is worried she is not getting enough.   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 Pt reports work is moderate stress. Pt states outside of work she feels she needs to take some time for herself for stress relief.   Pt states she has a treadmill at home but hasn't been using it. Pt wants to start walking on her treadmill for physical activity.   Physical Activity: ADLs Stress: low-to-moderate stress, mostly stressed about blood glucose Sleep: 7-8 hours.   24 hr Recall:  First Meal: none Snack: 11am: strawberries OR nuts Second meal: 12pm: ground [place] and salsa, sour cream, lettuce, sometimes beans Snack: sargento cheese/nut/cranberry pack Third meal: chili (meat and beans) and broccoli OR ground [place] and spaghetti squash Snack: nuts or sunflower seeds Beverages: 64 oz water, crystal lite, occasional diet ginger ale  NUTRITION INTERVENTION  Nutrition education (E-1) on the following topics:   Initial Follow-up  [x]  []  Definition of Diabetes [x]  []  Why dietary management is important in controlling blood glucose [x]  []  Effects each nutrient has on blood glucose levels [x]  []  Simple carbohydrates vs complex  carbohydrates [x]  []  Fluid intake [x]  []  Creating a balanced meal plan [x]  []  Carbohydrate counting  [x]  []  When to check blood glucose levels [x]  []  Proper blood glucose monitoring techniques [x]  []  Effect of stress and stress reduction techniques  [x]  []  Exercise effect on blood glucose levels, appropriate exercise during pregnancy [x]  []  Importance of limiting caffeine and abstaining from alcohol and smoking [x]  []  Medications used for blood sugar control during pregnancy [x]  []  Hypoglycemia and rule of 15 [x]  []  Postpartum self care   Patient has a meter and CGM prior to visit. Patient is testing pre breakfast and 2 hours after each meal. FBS: 85mg /dL (pt report) Postprandial: 147mg /dL (pt report)  Patient instructed to monitor glucose levels: FBS: 60 - <= 95 mg/dL; 2 hour: <= 657 mg/dL  Patient received handouts: Nutrition Diabetes and Pregnancy Blood glucose log Snack ideas for diabetes during pregnancy  Patient will be seen for follow-up as needed.  Goals Established by Patient:  Walk on the treadmill 3 days a week for 30 minutes.   At meals include 1/4 plate complex carbs, 1/4 plate protein, 1/2 plate non-starchy vegetables.   If blood glucose goes above range, do 10-15 minutes of exercise.   Diabetes Self-Management Education  Visit Type: First/Initial  Appt. Start Time: 1520 Appt. End Time: `620  08/30/2023  [name], identified by name and date of birth, is a 42 y.o. female with a diagnosis of Diabetes: Type 2.   ASSESSMENT  Last menstrual period 05/27/2023, unknown if currently breastfeeding. There is no height or weight on file to calculate BMI.  Diabetes Self-Management Education - 08/30/23 1526       Visit Information   Visit Type First/Initial      Initial Visit   Diabetes Type Type 2    Date Diagnosed 2002    Are you currently following a meal plan? No    Are you taking your medications as prescribed? Yes      Health Coping    How would you rate your overall health? Fair      Psychosocial Assessment   Patient Belief/Attitude about Diabetes Motivated to manage diabetes    What is the hardest part about your diabetes right now, causing you the most concern, or is the most worrisome to you about your diabetes?   Making healty food and beverage choices    Self-care barriers None    Self-management support Doctor's office    Other persons present Patient    Patient Concerns Nutrition/Meal planning    Special Needs None    Preferred Learning Style No preference indicated    Learning Readiness Ready    How often do you need to have someone help you when you read instructions, pamphlets, or other written materials from your doctor or pharmacy? 1 - Never    What is the last grade level you completed in school? masters      Pre-Education Assessment   Patient understands the diabetes disease and treatment process. Needs Instruction    Patient understands incorporating nutritional management into lifestyle. Needs Instruction    Patient undertands incorporating physical activity into lifestyle. Needs Instruction    Patient understands using medications safely. Needs Instruction    Patient understands monitoring blood glucose, interpreting and using results Needs Instruction    Patient understands prevention, detection, and treatment of acute complications. Needs Instruction    Patient understands prevention, detection, and treatment of chronic complications. Needs Instruction    Patient understands how to develop strategies to address psychosocial issues. Needs Instruction    Patient understands how to develop strategies to promote health/change behavior. Needs Instruction      Complications   Last HgB A1C per patient/outside source 6.8 %    How often do you check your blood sugar? > 4 times/day    Fasting Blood glucose range (mg/dL) 95-188    Postprandial Blood glucose range (mg/dL) 41-660    Have you had a dilated  eye exam in the past 12 months? No    Have you had a dental exam in the past 12 months? No    Are you checking your feet? Yes    How many days per week are you checking your feet? 2      Dietary Intake   Breakfast none    Snack (morning) 11am: strawberries OR nuts    Lunch 12pm: ground [place] and salsa, sour cream, lettuce, sometimes beans    Snack (afternoon) sargento cheese/nut/cranberry pack    Dinner chili (meat and beans) and broccoli OR ground [place] and spaghetti squash    Snack (evening) nuts or sunflower seeds    Beverage(s) 64 oz water, crystal lite, occasional diet ginger ale      Activity / Exercise   Activity / Exercise Type ADL's    How many days per week do you exercise? 0    How many minutes per day do you exercise? 0    Total minutes per week of exercise 0      Patient Education   Previous Diabetes Education Yes (please comment)    Disease Pathophysiology  Explored patient's options for treatment of their diabetes;Factors that contribute to the development of diabetes    Healthy Eating Role of diet in the treatment of diabetes and the relationship between the three main macronutrients and blood glucose level;Plate Method;Reviewed blood glucose goals for pre and post meals and how to evaluate the patients' food intake on their blood glucose level.;Meal options for control of blood glucose level and chronic complications.;Meal timing in regards to the patients' current diabetes medication.    Being Active Role of exercise on diabetes management, blood pressure control and cardiac health.;Helped patient identify appropriate exercises in relation to his/her diabetes, diabetes complications and other health issue.    Medications Taught/reviewed insulin/injectables, injection, site rotation, insulin/injectables storage and needle disposal.    Monitoring Identified appropriate SMBG and/or A1C goals.;Daily foot exams;Yearly dilated eye exam    Acute complications Taught prevention,  symptoms, and  treatment of hypoglycemia - the 15 rule.;Discussed and identified patients' prevention, symptoms, and treatment of hyperglycemia.    Chronic complications Relationship between chronic complications and blood glucose control;Identified and discussed with patient  current chronic complications    Diabetes Stress and Support Identified and addressed patients feelings and concerns about diabetes;Role of stress on diabetes    Preconception care Pregnancy and GDM  Role of pre-pregnancy blood glucose control on the development of the fetus;Reviewed with patient blood glucose goals with pregnancy;Role of family planning for patients with diabetes    Lifestyle and Health Coping Lifestyle issues that need to be addressed for better diabetes care      Individualized Goals (developed by patient)   Nutrition General guidelines for healthy choices and portions discussed    Physical Activity Exercise 3-5 times per week;30 minutes per day    Medications take my medication as prescribed    Monitoring  Consistenly use CGM    Problem Solving Eating Pattern    Reducing Risk examine blood glucose patterns;do foot checks daily;treat hypoglycemia with 15 grams of carbs if blood glucose less than 70mg /dL    Health Coping Ask for help with psychological, social, or emotional issues      Post-Education Assessment   Patient understands the diabetes disease and treatment process. Comprehends key points    Patient understands incorporating nutritional management into lifestyle. Comprehends key points    Patient undertands incorporating physical activity into lifestyle. Comprehends key points    Patient understands using medications safely. Comphrehends key points    Patient understands monitoring blood glucose, interpreting and using results Comprehends key points    Patient understands prevention, detection, and treatment of acute complications. Comprehends key points    Patient understands prevention,  detection, and treatment of chronic complications. Comprehends key points    Patient understands how to develop strategies to address psychosocial issues. Comprehends key points    Patient understands how to develop strategies to promote health/change behavior. Comprehends key points      Outcomes   Expected Outcomes Demonstrated interest in learning. Expect positive outcomes    Future DMSE PRN    Program Status Completed             Individualized Plan for Diabetes Self-Management Training:   Learning Objective:  Patient will have a greater understanding of diabetes self-management. Patient education plan is to attend individual and/or group sessions per assessed needs and concerns.   Plan:   Patient Instructions  Goals Established by Patient:  Walk on the treadmill 3 days a week for 30 minutes.   At meals include 1/4  plate complex carbs, 1/4 plate protein, 1/2 plate non-starchy vegetables.   If blood glucose goes above range, do 10-15 minutes of exercise.   Expected Outcomes:  Demonstrated interest in learning. Expect positive outcomes  Education material provided: My Plate and Snack sheet, Diabetes in Pregnancy Packet  If problems or questions, patient to contact team via:  Phone  Future DSME appointment: PRN

## 2023-08-31 ENCOUNTER — Encounter: Payer: Self-pay | Admitting: Cardiology

## 2023-08-31 ENCOUNTER — Ambulatory Visit: Payer: BC Managed Care – PPO | Attending: Cardiology | Admitting: Cardiology

## 2023-08-31 VITALS — BP 120/82 | HR 75 | Ht 64.0 in | Wt 242.8 lb

## 2023-08-31 DIAGNOSIS — E1169 Type 2 diabetes mellitus with other specified complication: Secondary | ICD-10-CM | POA: Diagnosis not present

## 2023-08-31 DIAGNOSIS — O10919 Unspecified pre-existing hypertension complicating pregnancy, unspecified trimester: Secondary | ICD-10-CM | POA: Diagnosis not present

## 2023-08-31 DIAGNOSIS — O09522 Supervision of elderly multigravida, second trimester: Secondary | ICD-10-CM

## 2023-08-31 DIAGNOSIS — O99210 Obesity complicating pregnancy, unspecified trimester: Secondary | ICD-10-CM

## 2023-08-31 DIAGNOSIS — O24319 Unspecified pre-existing diabetes mellitus in pregnancy, unspecified trimester: Secondary | ICD-10-CM | POA: Diagnosis not present

## 2023-08-31 DIAGNOSIS — Z3A13 13 weeks gestation of pregnancy: Secondary | ICD-10-CM

## 2023-08-31 DIAGNOSIS — E669 Obesity, unspecified: Secondary | ICD-10-CM

## 2023-08-31 DIAGNOSIS — O0990 Supervision of high risk pregnancy, unspecified, unspecified trimester: Secondary | ICD-10-CM

## 2023-08-31 IMAGING — US US FNA BIOPSY THYROID 1ST LESION
1 series · 12 of 12 positions shown · non-contrast
Comparison: US Thyroid 09/17/21;

INDICATION: Indeterminate right thyroid nodule.

EXAM:
ULTRASOUND GUIDED FINE NEEDLE ASPIRATION OF INDETERMINATE THYROID
NODULE
TECHNIQUE: Informed written consent was obtained from the patient after a
discussion of the risks, benefits and alternatives to treatment.
Questions regarding the procedure were encouraged and answered. A
timeout was performed prior to the initiation of the procedure.

[Series 1: us fna biopsy thyroid 1st lesion · 0.06mm/px · 12 acquisitions, 12 frames shown]
[im 1/12]
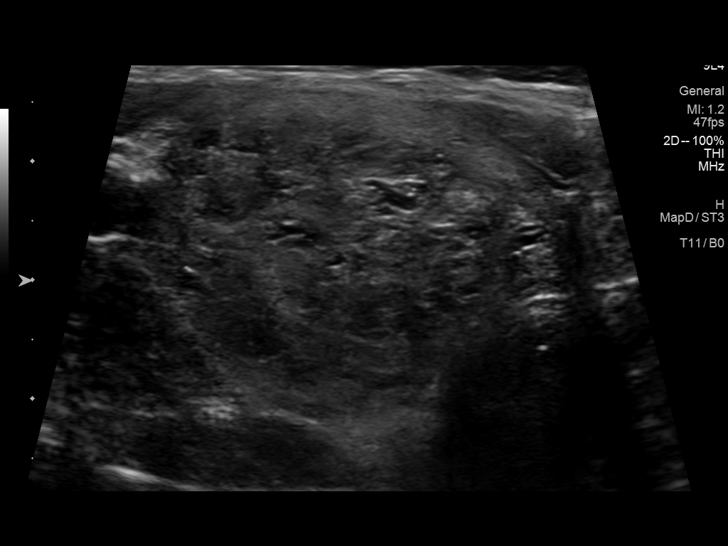
[im 2/12]
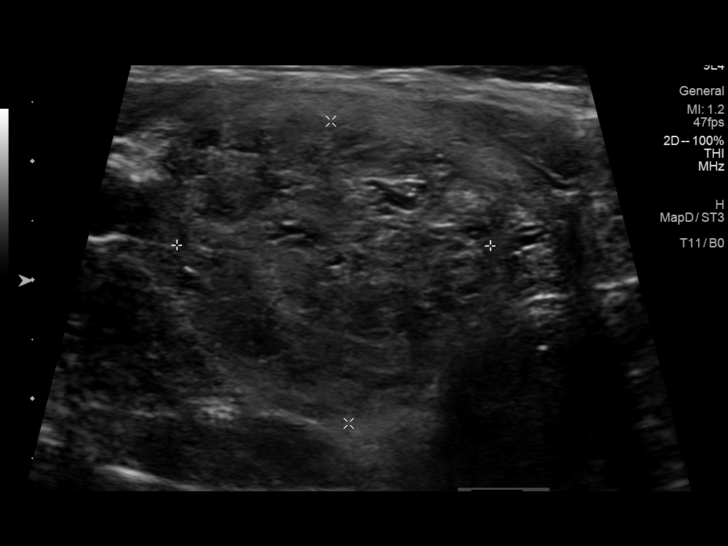
[im 3/12]
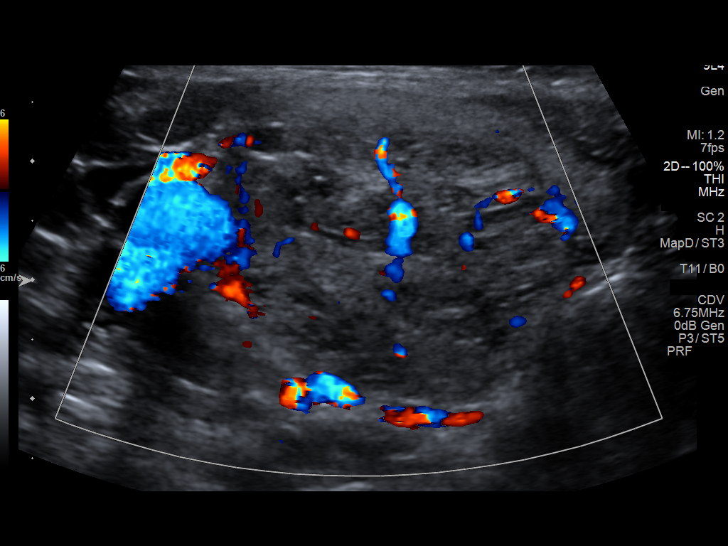
[im 4/12]
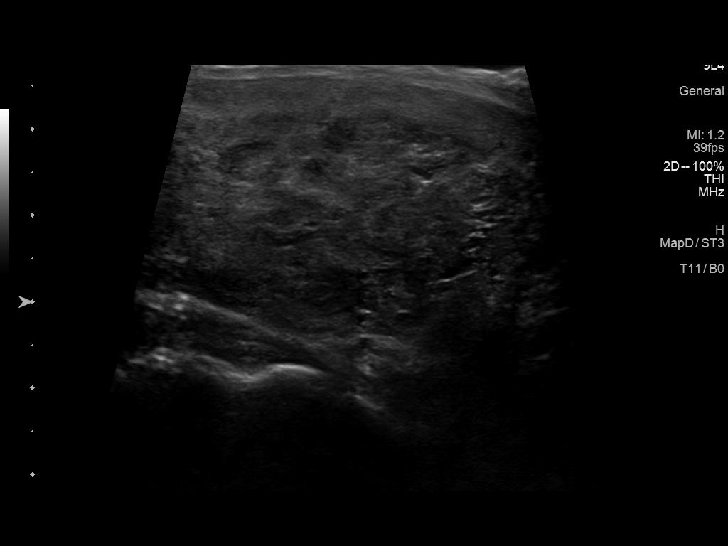
[im 5/12]
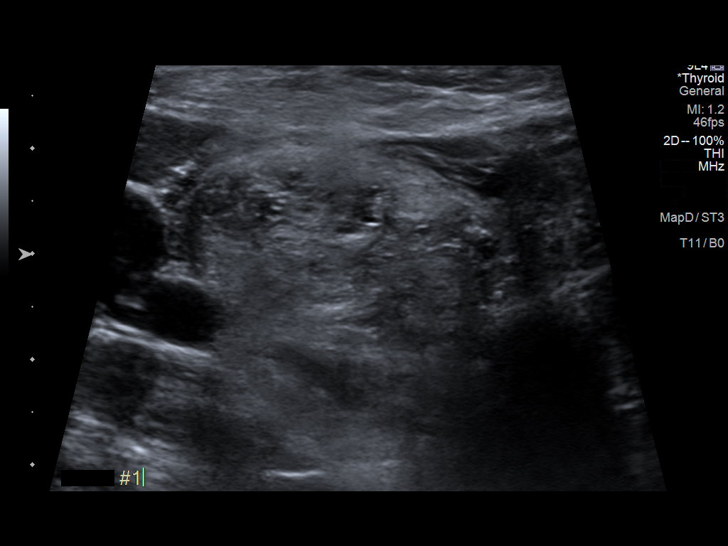
[im 6/12]
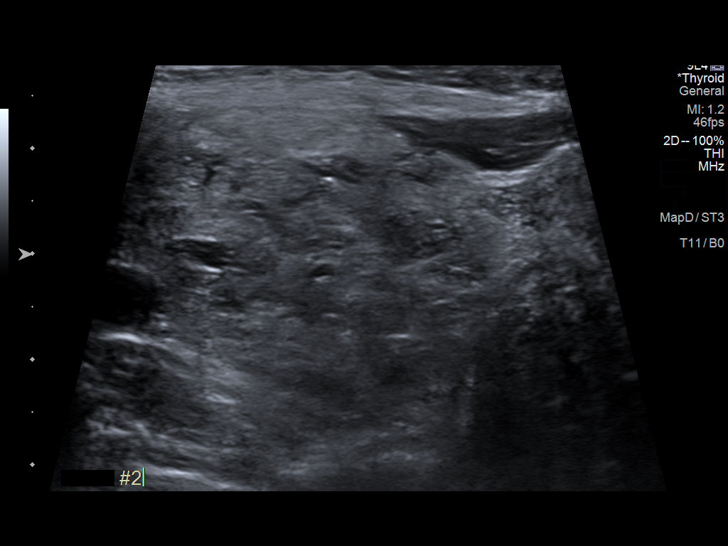
[im 7/12]
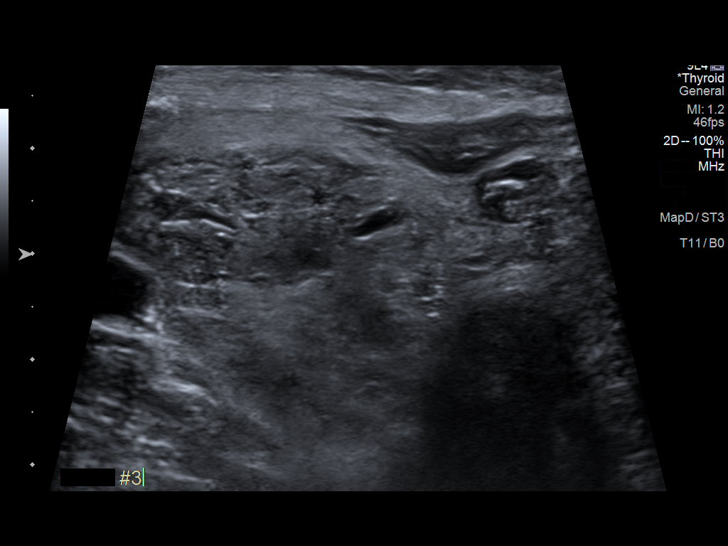
[im 8/12]
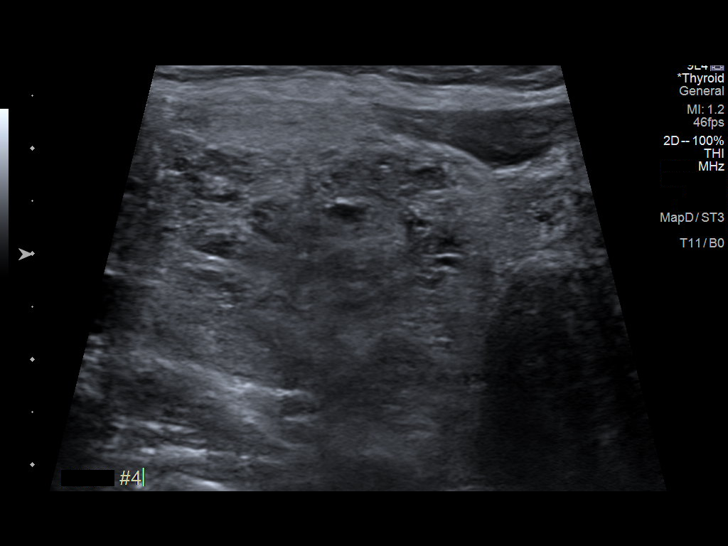
[im 9/12]
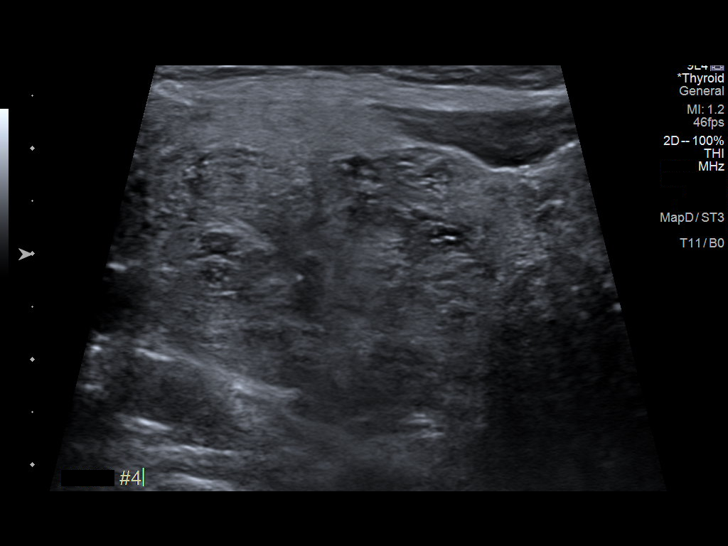
[im 10/12]
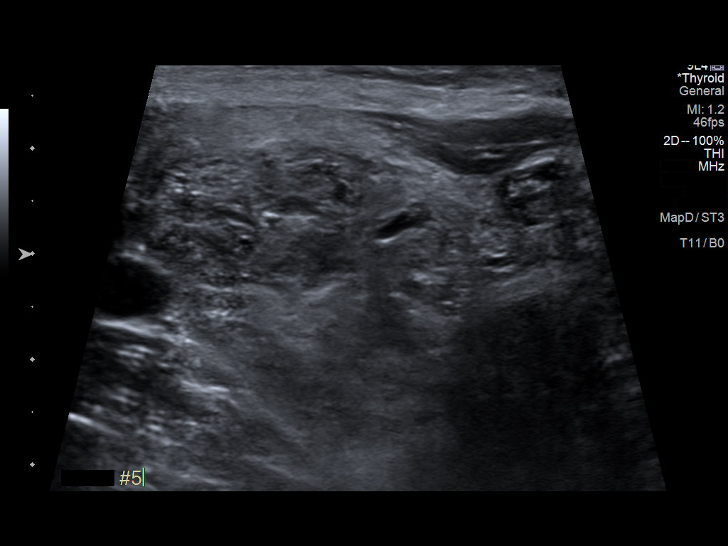
[im 11/12]
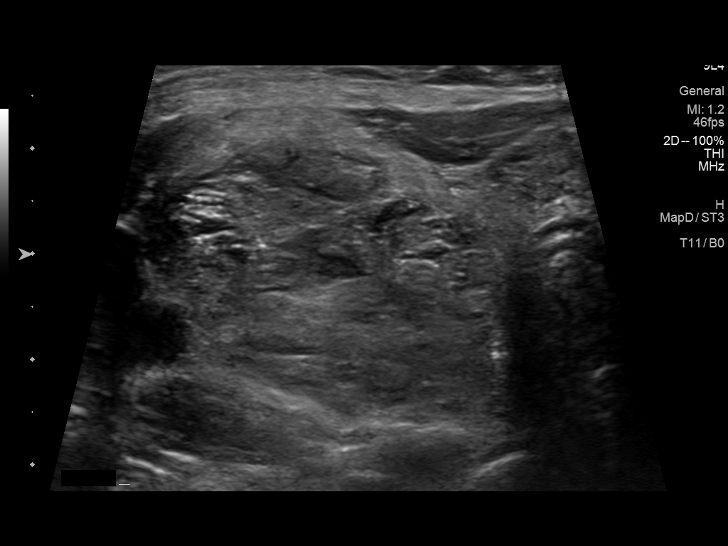
[im 12/12]
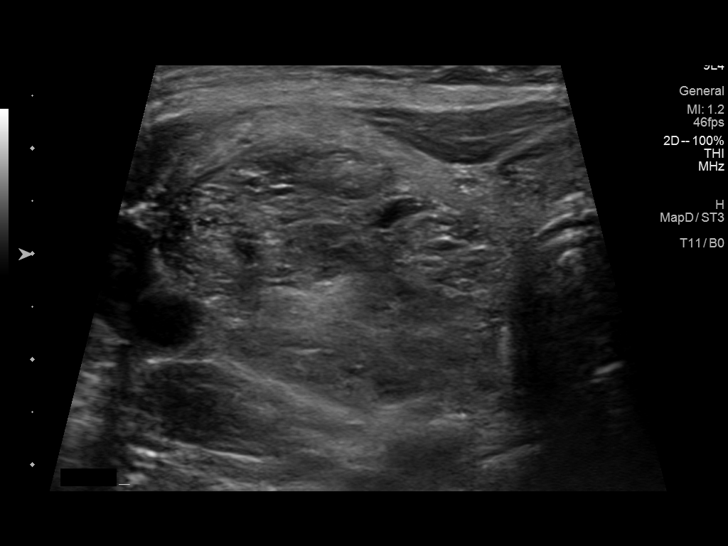

[12 of 12 positions shown; findings below may reference images not displayed]

previous biopsy Left nodule 01/23/21

MEDICATIONS:
5 cc 1% lidocaine

COMPLICATIONS:
None immediate.
Pre-procedural ultrasound scanning demonstrated unchanged size and
appearance of the indeterminate nodule within the right thyroid.

The procedure was planned. The neck was prepped in the usual sterile
fashion, and a sterile drape was applied covering the operative
field. A timeout was performed prior to the initiation of the
procedure. Local anesthesia was provided with 1% lidocaine.

Under direct ultrasound guidance, 5 FNA biopsies were performed of
the right inferior thyroid nodule with a 27 gauge needle.

2 of these samples were obtained for AFIRMA.

Multiple ultrasound images were saved for procedural documentation
purposes. The samples were prepared and submitted to pathology.

Limited post procedural scanning was negative for hematoma or
additional complication. Dressings were placed. The patient
tolerated the above procedures procedure well without immediate
postprocedural complication.
FINDINGS: Nodule reference number based on prior diagnostic ultrasound: 3

Maximum size: 3.4 cm

Location: Right; Inferior

ACR TI-RADS risk category: TR3 (3 points)

Reason for biopsy: meets ACR TI-RADS criteria

Ultrasound imaging confirms appropriate placement of the needles
within the thyroid nodule.
IMPRESSION: Technically successful ultrasound guided fine needle aspiration of
right inferior thyroid nodule.

Read by

Eloina Taheri

## 2023-08-31 NOTE — Patient Instructions (Addendum)
Medication Instructions:  Your physician recommends that you continue on your current medications as directed. Please refer to the Current Medication list given to you today.    *If you need a refill on your cardiac medications before your next appointment, please call your pharmacy*   Lab Work: None    If you have labs (blood work) drawn today and your tests are completely normal, you will receive your results only by: MyChart Message (if you have MyChart) OR A paper copy in the mail If you have any lab test that is abnormal or we need to change your treatment, we will call you to review the results.   Testing/Procedures: None    Follow-Up: At [facility], you and your health needs are our priority.  As part of our continuing mission to provide you with exceptional heart care, we have created designated Provider Care Teams.  These Care Teams include your primary Cardiologist (physician) and Advanced Practice Providers (APPs -  Physician Assistants and Nurse Practitioners) who all work together to provide you with the care you need, when you need it.  We recommend signing up for the patient portal called "MyChart".  Sign up information is provided on this After Visit Summary.  MyChart is used to connect with patients for Virtual Visits (Telemedicine).  Patients are able to view lab/test results, encounter notes, upcoming appointments, etc.  Non-urgent messages can be sent to your provider as well.   To learn more about what you can do with MyChart, go to [email or web address].    Your next appointment:   4 week(s): September 28, 2023 AT 3:00PM   The format for your next appointment:   In Person  Provider:   Thomasene Ripple, DO    Other Instructions

## 2023-08-31 NOTE — Progress Notes (Signed)
Cardio-Obstetrics Clinic  Follow Up Note   Date:  08/31/2023   ID:  RAMIYAH Romero, DOB 1981-08-05, MRN [id number]  PCP:  Melida Quitter, MD   [facility] Providers Cardiologist:  Thomasene Ripple, DO  Electrophysiologist:  None        Referring MD: Melida Quitter, MD   Chief Complaint: " I am ok"  History of Present Illness:    [name] is a 42 y.o. female [G2P0010] who returns for follow up at [redacted] weeks pregnant.  Medical history includes diabetes mellitus type 2, obesity she is status post lap band in 2009, coronary artery disease status post inferior wall MI at the age of 87, status post CABG (LIMA to LAD, left radial to diagonal, SVG to sequential to PDA and PLb this was done in November 2018, hypertension, hyperlipidemia.   She presents for a routine prenatal visit at [redacted] weeks gestation. She reports feeling the baby move a lot and describes it as the baby stretching. She denies any chest pain or shortness of breath, but occasionally feels a little something, which she does not elaborate on. She is also trying to stay active and has set goals with a dietician to walk three times a week for thirty minutes and gradually increase to five times a week. She is also under the care of a physician and midwives for her pregnancy. Her main complaint today is exhaustion, which she attributes to her pregnancy. She also reports occasional stomach discomfort after eating.  Prior CV Studies Reviewed: The following studies were reviewed today: Reviewed echo  Past Medical History:  Diagnosis Date   Anxiety    Back pain    Chest pain    Coronary artery disease    Coronary artery disease involving coronary bypass graft of native heart with angina pectoris (HCC) 08/02/2017   Diabetes mellitus    Difficulty swallowing pills    Fatigue    GERD (gastroesophageal reflux disease)    Gout    Hay fever    Headache    Heartburn    History of heart attack    HTN  (hypertension)    Hyperlipidemia    Joint pain    Knee pain    Nervousness    Palpitations    Pure hypercholesterolemia with target low density lipoprotein (LDL) cholesterol less than 70 mg/dL 03/19/1600   Shortness of breath on exertion    Vitamin D deficiency 11/20/2018    Past Surgical History:  Procedure Laterality Date   CORONARY ARTERY BYPASS GRAFT N/A 08/02/2017   Procedure: CORONARY ARTERY BYPASS GRAFTING (CABG) X 5 (LIMA to LAD, LEFT RADIAL ARTERY to DIAGONAL, SVG to SEQUENTIALLY PDA and PLB) , USING LEFT INTERNAL MAMMARY ARTERY, LEFT RADIAL ARTERY, AND  SAPHENOUS VEIN to CIRCUMFLEX, RIGHT GREATER SAPHENOUS VEIN HARVESTED ENDOSCOPICALLY;  Surgeon: Delight Ovens, MD;  Location: [facility] OR;  Service: Open Heart Surgery;  Laterality: N/A;   CORONARY/GRAFT ACUTE MI REVASCULARIZATION N/A 07/24/2017   Procedure: Coronary/Graft Acute MI Revascularization;  Surgeon: Lyn Records, MD;  Location: [facility] INVASIVE CV LAB;  Service: Cardiovascular;  Laterality: N/A;   LAPAROSCOPIC GASTRIC BANDING  09/02/2008   LEFT HEART CATH AND CORONARY ANGIOGRAPHY N/A 07/24/2017   Procedure: LEFT HEART CATH AND CORONARY ANGIOGRAPHY;  Surgeon: Lyn Records, MD;  Location: [facility] INVASIVE CV LAB;  Service: Cardiovascular;  Laterality: N/A;   RADIAL ARTERY HARVEST Left 08/02/2017   Procedure: LEFT RADIAL ARTERY HARVEST;  Surgeon: Delight Ovens, MD;  Location: [facility] OR;  Service: Open Heart Surgery;  Laterality: Left;   STERNAL CLOSURE N/A 08/02/2017   Procedure: STERNAL PLATING;  Surgeon: Delight Ovens, MD;  Location: [facility] OR;  Service: Open Heart Surgery;  Laterality: N/A;   STOMACH SURGERY  1982   had metal removed from stomach as a child   TEE WITHOUT CARDIOVERSION N/A 08/02/2017   Procedure: TRANSESOPHAGEAL ECHOCARDIOGRAM (TEE);  Surgeon: Delight Ovens, MD;  Location: [facility] OR;  Service: Open Heart Surgery;  Laterality: N/A;      OB History     Gravida  2   Para  0   Term  0   Preterm  0   AB   1   Living  0      SAB  1   IAB  0   Ectopic  0   Multiple  0   Live Births  0               Current Medications: Current Meds  Medication Sig   acetaminophen (TYLENOL) 500 MG tablet Take 500 mg by mouth every 6 (six) hours as needed for mild pain (pain score 1-3) or headache.   aspirin EC 81 MG tablet Take 2 tablets (162 mg total) by mouth daily.   BD PEN NEEDLE NANO 2ND GEN 32G X 4 MM MISC See admin instructions.   cholecalciferol (VITAMIN D3) 25 MCG (1000 UNIT) tablet Take 1,000 Units by mouth daily.   clobetasol (OLUX) 0.05 % topical foam Apply 1 application topically as directed.   insulin aspart (NOVOLOG FLEXPEN) 100 UNIT/ML FlexPen Take 3-5 units before each meal depending on the sugar range   LANTUS SOLOSTAR 100 UNIT/ML Solostar Pen Inject into the skin. PATIENT USES 12 UNITS IN THE MORNING AND 12 UNITS AT NIGHT.   metoprolol tartrate (LOPRESSOR) 25 MG tablet Take 1 tablet (25 mg total) by mouth 2 (two) times daily.   polycarbophil (FIBERCON) 625 MG tablet Take 625 mg by mouth daily.   Prenatal 28-0.8 MG TABS Take 1 tablet by mouth daily.   Turmeric 500 MG CAPS See admin instructions.   vitamin B-12 (CYANOCOBALAMIN) 100 MCG tablet Take 100 mcg by mouth daily.     Allergies:   Patient has no known allergies.   Social History   Socioeconomic History   Marital status: Married    Spouse name: Alexix Kratz   Number of children: Not on file   Years of education: Not on file   Highest education level: Not on file  Occupational History   Occupation: Retail banker: [employer]  Tobacco Use   Smoking status: Never   Smokeless tobacco: Never  Vaping Use   Vaping status: Never Used  Substance and Sexual Activity   Alcohol use: Not Currently    Comment: occ   Drug use: No   Sexual activity: Not Currently  Other Topics Concern   Not on file  Social History Narrative   Not on file   Social Determinants of Health    Financial Resource Strain: Not on file  Food Insecurity: No Food Insecurity (08/17/2023)   Hunger Vital Sign    Worried About Running Out of Food in the Last Year: Never true    Ran Out of Food in the Last Year: Never true  Transportation Needs: No Transportation Needs (08/17/2023)   PRAPARE - Administrator, Civil Service (Medical): No    Lack of Transportation (Non-Medical): No  Physical  Activity: Not on file  Stress: Not on file  Social Connections: Not on file      Family History  Problem Relation Age of Onset   Hyperlipidemia Mother    Hypertension Mother    Obesity Mother    Diabetes Father    Hypertension Father    Heart disease Father    Sudden death Father 42       died of MI   Cancer Maternal Grandmother        lung   Cancer Maternal Grandfather        prostate      ROS:   Please see the history of present illness.     All other systems reviewed and are negative.   Labs/EKG Reviewed:    EKG:   EKG was not ordered today.    Recent Labs: 08/22/2023: ALT 6; BUN 11; Creatinine, Ser 0.79; Hemoglobin 14.0; Platelets 222; Potassium 4.2; Sodium 134; TSH 0.070   Recent Lipid Panel Lab Results  Component Value Date/Time   CHOL 141 05/20/2023 10:03 AM   TRIG 62 05/20/2023 10:03 AM   HDL 49 05/20/2023 10:03 AM   CHOLHDL 2.9 05/20/2023 10:03 AM   CHOLHDL 3.6 07/26/2017 02:50 AM   LDLCALC 79 05/20/2023 10:03 AM    Physical Exam:    VS:  BP 120/82 (BP Location: Right Arm, Patient Position: Sitting, Cuff Size: Large)   Pulse 75   Ht 5\' 4"  (1.626 m)   Wt 242 lb 12.8 oz (110.1 kg)   LMP 05/27/2023 (Exact Date)   SpO2 99%   BMI 41.68 kg/m     Wt Readings from Last 3 Encounters:  08/31/23 242 lb 12.8 oz (110.1 kg)  08/17/23 243 lb (110.2 kg)  08/01/23 248 lb 12.8 oz (112.9 kg)     GEN:  Well nourished, well developed in no acute distress HEENT: Normal NECK: No JVD; No carotid bruits LYMPHATICS: No lymphadenopathy CARDIAC: RRR, no  murmurs, rubs, gallops RESPIRATORY:  Clear to auscultation without rales, wheezing or rhonchi  ABDOMEN: Soft, non-tender, non-distended MUSCULOSKELETAL:  No edema; No deformity  SKIN: Warm and dry NEUROLOGIC:  Alert and oriented x 3 PSYCHIATRIC:  Normal affect    Risk Assessment/Risk Calculators:                 ASSESSMENT & PLAN:    Pregnancy in the setting of CAD No chest pain suspected for anginal. Will continue to monitor her closely. She has been off atorvastatin, we discussed at her last visit risk and benefit of keeping statin on given high risk ASCVD but patient declined.  Patient prefers to discontinue statin therapy during pregnancy. --Consider restarting statin therapy postpartum depending on breastfeeding status. Will closely monitor if needed will increase metoprolol if suspect angina. Will adjust ASA to 162 mg for preeclampsia prophylaxis  CARPEG II Score = 8 points, 41 % primary cardiac event risk. WHO Class II-III, high risk pregnancy requiring frequent cardiology follow up.  She has not been seen by OB yet.  She has a visit with our [facility] OB faculty practice, she will benefit from MFM care as well.  Prenatal care Continue with prenatal vitamins. Start aspirin 162 mg daily   Hypertensive disorder in pregnancy is with chronic hypertension affecting pregnancy. -Monitor blood pressure daily at home. -Report any blood pressure readings consistently over 130 for three days or any reading over 140 immediately. -Refer to pharmacist for management of diabetes during pregnancy.  Physical Activity and Nutrition Patient has set  goals with a dietician to walk three times a week for 30 minutes, with a plan to increase to five times a week. -Continue with the current plan.  -Plan for more frequent visits as pregnancy progresses (every 4 weeks initially, then every 2 weeks closer to term).  Patient Instructions  Medication Instructions:  Your physician recommends that  you continue on your current medications as directed. Please refer to the Current Medication list given to you today.    *If you need a refill on your cardiac medications before your next appointment, please call your pharmacy*   Lab Work: None    If you have labs (blood work) drawn today and your tests are completely normal, you will receive your results only by: MyChart Message (if you have MyChart) OR A paper copy in the mail If you have any lab test that is abnormal or we need to change your treatment, we will call you to review the results.   Testing/Procedures: None    Follow-Up: At [facility], you and your health needs are our priority.  As part of our continuing mission to provide you with exceptional heart care, we have created designated Provider Care Teams.  These Care Teams include your primary Cardiologist (physician) and Advanced Practice Providers (APPs -  Physician Assistants and Nurse Practitioners) who all work together to provide you with the care you need, when you need it.  We recommend signing up for the patient portal called "MyChart".  Sign up information is provided on this After Visit Summary.  MyChart is used to connect with patients for Virtual Visits (Telemedicine).  Patients are able to view lab/test results, encounter notes, upcoming appointments, etc.  Non-urgent messages can be sent to your provider as well.   To learn more about what you can do with MyChart, go to [email or web address].    Your next appointment:   4 week(s): September 28, 2023 AT 3:00PM   The format for your next appointment:   In Person  Provider:   Thomasene Ripple, DO    Other Instructions    Dispo:  No follow-ups on file.   Medication Adjustments/Labs and Tests Ordered: Current medicines are reviewed at length with the patient today.  Concerns regarding medicines are outlined above.  Tests Ordered: No orders of the defined types were placed in this  encounter.  Medication Changes: No orders of the defined types were placed in this encounter.

## 2023-09-01 ENCOUNTER — Ambulatory Visit
Admission: RE | Admit: 2023-09-01 | Discharge: 2023-09-01 | Disposition: A | Discharge status: Home / Self Care | Payer: BC Managed Care – PPO | Source: Ambulatory Visit | Attending: Nurse Practitioner | Admitting: Nurse Practitioner

## 2023-09-01 DIAGNOSIS — E042 Nontoxic multinodular goiter: Secondary | ICD-10-CM

## 2023-09-01 LAB — HORIZON CUSTOM: REPORT SUMMARY: POSITIVE — AB

## 2023-09-03 ENCOUNTER — Encounter: Payer: Self-pay | Admitting: Obstetrics & Gynecology

## 2023-09-03 DIAGNOSIS — D573 Sickle-cell trait: Secondary | ICD-10-CM | POA: Insufficient documentation

## 2023-09-03 HISTORY — DX: Sickle-cell trait: D57.3

## 2023-09-08 ENCOUNTER — Ambulatory Visit: Payer: BC Managed Care – PPO | Admitting: Cardiology

## 2023-09-11 ENCOUNTER — Encounter (HOSPITAL_COMMUNITY): Payer: Self-pay | Admitting: Obstetrics & Gynecology

## 2023-09-11 ENCOUNTER — Ambulatory Visit
Admission: EM | Admit: 2023-09-11 | Discharge: 2023-09-11 | Disposition: A | Discharge status: Other Acute Inpatient Hospital | Payer: BC Managed Care – PPO

## 2023-09-11 ENCOUNTER — Inpatient Hospital Stay (HOSPITAL_COMMUNITY)
Admission: AD | Admit: 2023-09-11 | Discharge: 2023-09-11 | Disposition: A | Discharge status: Home / Self Care | Payer: BC Managed Care – PPO | Attending: Obstetrics & Gynecology | Admitting: Obstetrics & Gynecology

## 2023-09-11 DIAGNOSIS — O98512 Other viral diseases complicating pregnancy, second trimester: Secondary | ICD-10-CM | POA: Diagnosis not present

## 2023-09-11 DIAGNOSIS — B029 Zoster without complications: Secondary | ICD-10-CM

## 2023-09-11 DIAGNOSIS — O09522 Supervision of elderly multigravida, second trimester: Secondary | ICD-10-CM | POA: Diagnosis not present

## 2023-09-11 DIAGNOSIS — B028 Zoster with other complications: Secondary | ICD-10-CM

## 2023-09-11 DIAGNOSIS — Z3A15 15 weeks gestation of pregnancy: Secondary | ICD-10-CM | POA: Diagnosis not present

## 2023-09-11 DIAGNOSIS — O26892 Other specified pregnancy related conditions, second trimester: Secondary | ICD-10-CM | POA: Diagnosis not present

## 2023-09-11 DIAGNOSIS — Z951 Presence of aortocoronary bypass graft: Secondary | ICD-10-CM | POA: Diagnosis not present

## 2023-09-11 DIAGNOSIS — I252 Old myocardial infarction: Secondary | ICD-10-CM | POA: Diagnosis not present

## 2023-09-11 DIAGNOSIS — M546 Pain in thoracic spine: Secondary | ICD-10-CM | POA: Diagnosis present

## 2023-09-11 DIAGNOSIS — R21 Rash and other nonspecific skin eruption: Secondary | ICD-10-CM | POA: Diagnosis present

## 2023-09-11 DIAGNOSIS — N912 Amenorrhea, unspecified: Secondary | ICD-10-CM | POA: Insufficient documentation

## 2023-09-11 DIAGNOSIS — O0990 Supervision of high risk pregnancy, unspecified, unspecified trimester: Secondary | ICD-10-CM

## 2023-09-11 DIAGNOSIS — O10012 Pre-existing essential hypertension complicating pregnancy, second trimester: Secondary | ICD-10-CM | POA: Diagnosis not present

## 2023-09-11 DIAGNOSIS — O99412 Diseases of the circulatory system complicating pregnancy, second trimester: Secondary | ICD-10-CM | POA: Insufficient documentation

## 2023-09-11 DIAGNOSIS — M549 Dorsalgia, unspecified: Secondary | ICD-10-CM

## 2023-09-11 DIAGNOSIS — O24112 Pre-existing diabetes mellitus, type 2, in pregnancy, second trimester: Secondary | ICD-10-CM | POA: Insufficient documentation

## 2023-09-11 DIAGNOSIS — M545 Low back pain, unspecified: Secondary | ICD-10-CM

## 2023-09-11 DIAGNOSIS — O99210 Obesity complicating pregnancy, unspecified trimester: Secondary | ICD-10-CM | POA: Insufficient documentation

## 2023-09-11 DIAGNOSIS — I519 Heart disease, unspecified: Secondary | ICD-10-CM | POA: Insufficient documentation

## 2023-09-11 DIAGNOSIS — N898 Other specified noninflammatory disorders of vagina: Secondary | ICD-10-CM | POA: Insufficient documentation

## 2023-09-11 DIAGNOSIS — O09529 Supervision of elderly multigravida, unspecified trimester: Secondary | ICD-10-CM | POA: Insufficient documentation

## 2023-09-11 HISTORY — DX: Personal history of other infectious and parasitic diseases: Z86.19

## 2023-09-11 LAB — URINALYSIS, ROUTINE W REFLEX MICROSCOPIC
Bilirubin Urine: NEGATIVE
Glucose, UA: NEGATIVE mg/dL
Hgb urine dipstick: NEGATIVE
Ketones, ur: 20 mg/dL — AB
Leukocytes,Ua: NEGATIVE
Nitrite: NEGATIVE
Protein, ur: NEGATIVE mg/dL
Specific Gravity, Urine: 1.016 (ref 1.005–1.030)
pH: 5 (ref 5.0–8.0)

## 2023-09-11 MED ORDER — VALACYCLOVIR HCL 1 G PO TABS: 1000.0000 mg | ORAL_TABLET | Freq: Three times a day (TID) | ORAL | 0 refills | Status: AC

## 2023-09-11 MED ORDER — LIDOCAINE 5 % EX OINT: 1.0000 | TOPICAL_OINTMENT | Freq: Three times a day (TID) | CUTANEOUS | 0 refills | Status: AC | PRN

## 2023-09-11 MED ORDER — CYCLOBENZAPRINE HCL 10 MG PO TABS: 10.0000 mg | ORAL_TABLET | Freq: Three times a day (TID) | ORAL | 0 refills | Status: DC

## 2023-09-11 NOTE — ED Provider Notes (Signed)
Patient presents to urgent care for evaluation of low back pain.  She notes she thinks this is due to a strain after lifting something heavy however she is currently [redacted] weeks pregnant.  She also notes a significant rash to her right breast area that wraps around to her right thoracic back.  Given advanced maternal age pregnancy and back pain recommended further evaluation in the emergency room.  Patient is agreeable to same and husband will transport her via POV.   Tomi Bamberger, PA-C 09/11/23 [phone]

## 2023-09-11 NOTE — MAU Provider Note (Signed)
History     CSN: [id number]  Arrival date and time: 09/11/23 1023   Event Date/Time   First Provider Initiated Contact with Patient 09/11/23 1130      Chief Complaint  Patient presents with   Back Pain   Rash   [name] is a 42 y.o. G2P0010 at [redacted]w[redacted]d who receives care at [facility].   [name] Pregnancy is complicated by numerous HR Factors such as MI, T2DM, cHTN, AMA and is flagged as a red Chart. [name] presents today for evaluation of [name] upper back pain and states "was seen in urgent care today and DX with Shingles but no medication or treatment was provided" Reports [name] back pain is near [name] right shoulder blade  and rates 4/10 on pain scale. [name] acknowledges picking up a Large Bottle of Water and noticed pain afterwards. Sates pain is c/w muscular spasms. Yesterday [name] reports that [name] started to feel bumps and and a redness on [name] right thoracic back that is red and extends to under [name] right breast. [name] reports the pain to be 5/10 and states " feels like fire". [name]   OB History     Gravida  2   Para  0   Term  0   Preterm  0   AB  1   Living  0      SAB  1   IAB  0   Ectopic  0   Multiple  0   Live Births  0           Past Medical History:  Diagnosis Date   Anxiety    Back pain    Chest pain    Coronary artery disease    Coronary artery disease involving coronary bypass graft of native heart with angina pectoris (HCC) 08/02/2017   Diabetes mellitus    Difficulty swallowing pills    Fatigue    GERD (gastroesophageal reflux disease)    Gout    Hay fever    Headache    Heartburn    History of heart attack    History of varicella    HTN (hypertension)    Hyperlipidemia    Joint pain    Knee pain    Nervousness    Palpitations    Pure hypercholesterolemia with target low density lipoprotein (LDL) cholesterol less than 70 mg/dL 86/57/8469   Shortness of breath on exertion    Sickle cell trait (HCC)  09/03/2023   Vitamin D deficiency 11/20/2018    Past Surgical History:  Procedure Laterality Date   CORONARY ARTERY BYPASS GRAFT N/A 08/02/2017   Procedure: CORONARY ARTERY BYPASS GRAFTING (CABG) X 5 (LIMA to LAD, LEFT RADIAL ARTERY to DIAGONAL, SVG to SEQUENTIALLY PDA and PLB) , USING LEFT INTERNAL MAMMARY ARTERY, LEFT RADIAL ARTERY, AND  SAPHENOUS VEIN to CIRCUMFLEX, RIGHT GREATER SAPHENOUS VEIN HARVESTED ENDOSCOPICALLY;  Surgeon: Delight Ovens, MD;  Location: [facility] OR;  Service: Open Heart Surgery;  Laterality: N/A;   CORONARY/GRAFT ACUTE MI REVASCULARIZATION N/A 07/24/2017   Procedure: Coronary/Graft Acute MI Revascularization;  Surgeon: Lyn Records, MD;  Location: [facility] INVASIVE CV LAB;  Service: Cardiovascular;  Laterality: N/A;   LAPAROSCOPIC GASTRIC BANDING  09/02/2008   LEFT HEART CATH AND CORONARY ANGIOGRAPHY N/A 07/24/2017   Procedure: LEFT HEART CATH AND CORONARY ANGIOGRAPHY;  Surgeon: Lyn Records, MD;  Location: [facility] INVASIVE CV LAB;  Service: Cardiovascular;  Laterality: N/A;   RADIAL ARTERY HARVEST Left 08/02/2017  Procedure: LEFT RADIAL ARTERY HARVEST;  Surgeon: Delight Ovens, MD;  Location: [facility] OR;  Service: Open Heart Surgery;  Laterality: Left;   STERNAL CLOSURE N/A 08/02/2017   Procedure: STERNAL PLATING;  Surgeon: Delight Ovens, MD;  Location: [facility] OR;  Service: Open Heart Surgery;  Laterality: N/A;   STOMACH SURGERY  1982   had metal removed from stomach as a child   TEE WITHOUT CARDIOVERSION N/A 08/02/2017   Procedure: TRANSESOPHAGEAL ECHOCARDIOGRAM (TEE);  Surgeon: Delight Ovens, MD;  Location: [facility] OR;  Service: Open Heart Surgery;  Laterality: N/A;    Family History  Problem Relation Age of Onset   Hyperlipidemia Mother    Hypertension Mother    Obesity Mother    Diabetes Father    Hypertension Father    Heart disease Father    Sudden death Father 38       died of MI   Cancer Maternal Grandmother        lung   Cancer Maternal Grandfather         prostate    Social History   Tobacco Use   Smoking status: Never   Smokeless tobacco: Never  Vaping Use   Vaping status: Never Used  Substance Use Topics   Alcohol use: Not Currently    Comment: occ   Drug use: No    Allergies: No Known Allergies  Medications Prior to Admission  Medication Sig Dispense Refill Last Dose/Taking   acetaminophen (TYLENOL) 500 MG tablet Take 500 mg by mouth every 6 (six) hours as needed for mild pain (pain score 1-3) or headache.   09/11/2023 at  8:00 AM   amLODipine (NORVASC) 5 MG tablet Take 1 tablet (5 mg total) by mouth daily. 90 tablet 3 09/10/2023   aspirin EC 81 MG tablet Take 2 tablets (162 mg total) by mouth daily. 300 tablet 2 09/10/2023   cholecalciferol (VITAMIN D3) 25 MCG (1000 UNIT) tablet Take 1,000 Units by mouth daily.   09/10/2023   insulin aspart (NOVOLOG FLEXPEN) 100 UNIT/ML FlexPen Take 3-5 units before each meal depending on the sugar range   09/10/2023 at  6:00 PM   LANTUS SOLOSTAR 100 UNIT/ML Solostar Pen Inject into the skin. PATIENT USES 12 UNITS IN THE MORNING AND 12 UNITS AT NIGHT.   09/10/2023   metoprolol tartrate (LOPRESSOR) 25 MG tablet Take 1 tablet (25 mg total) by mouth 2 (two) times daily. 180 tablet 3 09/10/2023   Prenatal 28-0.8 MG TABS Take 1 tablet by mouth daily.   09/10/2023   vitamin B-12 (CYANOCOBALAMIN) 100 MCG tablet Take 100 mcg by mouth daily.   09/10/2023   BD PEN NEEDLE NANO 2ND GEN 32G X 4 MM MISC See admin instructions.      clobetasol (OLUX) 0.05 % topical foam Apply 1 application topically as directed. 50 g 3 More than a month   polycarbophil (FIBERCON) 625 MG tablet Take 625 mg by mouth daily.      Turmeric 500 MG CAPS See admin instructions.       Review of Systems  Constitutional: Negative.   Musculoskeletal:  Positive for back pain.  Skin:  Positive for color change and rash.   Physical Exam   Blood pressure 133/67, pulse 74, temperature 98.2 F (36.8 C), temperature source Oral, resp.  rate 18, height 5\' 4"  (1.626 m), weight 109.3 kg, last menstrual period 05/27/2023, SpO2 100%, unknown if currently breastfeeding.  Physical Exam Vitals and nursing note reviewed.  Constitutional:  Appearance: Normal appearance. [name] is obese.  Cardiovascular:     Rate and Rhythm: Normal rate and regular rhythm.  Pulmonary:     Effort: Pulmonary effort is normal.     Breath sounds: Normal breath sounds.  Musculoskeletal:     Thoracic back: Tenderness present.     Comments: Right upper back pain on palpation where lesion are present  Skin:    Findings: Erythema, lesion and rash present. Rash is vesicular.     Comments: Rash in right upper thoracic area of back which extends to the right breast  Neurological:     Mental Status: [name] is alert.    Fetal Assessment FH via doppler 168  MAU Course   Results for orders placed or performed during the hospital encounter of 09/11/23 (from the past 24 hours)  Urinalysis, Routine w reflex microscopic -Urine, Clean Catch     Status: Abnormal   Collection Time: 09/11/23 10:44 AM  Result Value Ref Range   Color, Urine YELLOW YELLOW   APPearance CLEAR CLEAR   Specific Gravity, Urine 1.016 1.005 - 1.030   pH 5.0 5.0 - 8.0   Glucose, UA NEGATIVE NEGATIVE mg/dL   Hgb urine dipstick NEGATIVE NEGATIVE   Bilirubin Urine NEGATIVE NEGATIVE   Ketones, ur 20 (A) NEGATIVE mg/dL   Protein, ur NEGATIVE NEGATIVE mg/dL   Nitrite NEGATIVE NEGATIVE   Leukocytes,Ua NEGATIVE NEGATIVE   No results found.  MDM Notes reviewed from urgent care visit today Physical Exam performed RX: written and counseling provided   Assessment and Plan  42 G2P0010  SIUP at 15.2 weeks FHR: 168   ASSESSMENT - 15.2 weeks pregnancy - Shingles in pregnancy - Back pain in pregnancy   PLAN - RX: Valtrex, Flexeril, Lidocaine ointment prescribed with education and counseling, S/R/B d/w patient and [name] verbalized understanding - Shingles education and literature  provided - Flexeril for back pain  - Patient agrees with A/P as described above  - F/U as scheduled   Colman Cater MS,NP 09/11/2023, 11:31 AM

## 2023-09-11 NOTE — ED Notes (Signed)
Patient is being discharged from the Urgent Care and sent to the Emergency Department via POV . Per RM, patient is in need of higher level of care due to back pain/pregnancy. Patient is aware and verbalizes understanding of plan of care.  Vitals:   09/11/23 0932  BP: 118/82  Pulse: 83  Resp: 18  Temp: 98.2 F (36.8 C)  SpO2: 99%

## 2023-09-11 NOTE — ED Triage Notes (Signed)
"  2 things going on, having really bad back pain on my left lower side, might have started after picking up heavy water, Wednesday that afternoon also noticed bumps around my right breast, right side and rash/bumps extending around right mid back area". "I am also [redacted] wks pregnant but no concerns with pregnancy". "I had Chickenpox as a child, ? History of vaccines".

## 2023-09-11 NOTE — Discharge Instructions (Signed)
Please call your office tomorrow and let them know you have DX of shingles prior to going to any apppoitments

## 2023-09-11 NOTE — MAU Note (Addendum)
.  [name] is a 42 y.o. at [redacted]w[redacted]d here in MAU reporting: sent from UC after being seen for R-upper back pain and rash that starts at her right breast and wraps around to her back. She reports picking up a pack of water last week and has had a squeezing back pain since then (9/10). She reports that the rash started last Wednesday or Thursday. UC diagnosed her with shingles - did have chicken pox as a child. UC recommended coming to MAU and did not provide any treatment. Denies VB or LOF.   Onset of complaint: Last week Pain score: 9/10 Vitals:   09/11/23 1041  BP: 137/69  Pulse: 78  Resp: 20  Temp: 98.2 F (36.8 C)  SpO2: 100%     FHT:168 Lab orders placed from triage: UA

## 2023-09-27 ENCOUNTER — Ambulatory Visit: Payer: 59 | Admitting: Obstetrics & Gynecology

## 2023-09-27 VITALS — BP 137/84 | HR 69 | Wt 245.6 lb

## 2023-09-27 DIAGNOSIS — O0990 Supervision of high risk pregnancy, unspecified, unspecified trimester: Secondary | ICD-10-CM

## 2023-09-27 DIAGNOSIS — O10919 Unspecified pre-existing hypertension complicating pregnancy, unspecified trimester: Secondary | ICD-10-CM

## 2023-09-27 DIAGNOSIS — I252 Old myocardial infarction: Secondary | ICD-10-CM

## 2023-09-27 DIAGNOSIS — B028 Zoster with other complications: Secondary | ICD-10-CM

## 2023-09-27 DIAGNOSIS — Z3A17 17 weeks gestation of pregnancy: Secondary | ICD-10-CM

## 2023-09-27 DIAGNOSIS — O24319 Unspecified pre-existing diabetes mellitus in pregnancy, unspecified trimester: Secondary | ICD-10-CM

## 2023-09-27 NOTE — Progress Notes (Signed)
 PRENATAL VISIT NOTE  Subjective:  [name] is a 43 y.o. G2P0010 at [redacted]w[redacted]d being seen today for ongoing prenatal care.  Accompanied by her FOB. She is currently monitored for the following issues for this high-risk pregnancy and has History of ST elevation myocardial infarction; Type 2 diabetes mellitus with obesity (HCC); Maternal morbid obesity, antepartum (HCC); Essential hypertension; History of gastric surgery; Supervision of high risk pregnancy, antepartum; Preexisting diabetes complicating pregnancy, antepartum; Preexisting hypertension complicating pregnancy, antepartum; AMA (advanced maternal age) 33+; Anxiety; Sickle cell trait (HCC); Maternal age 70+, multigravida, antepartum; Maternal obesity affecting pregnancy, antepartum; Cardiac complication; Vaginal discharge; and Amenorrhea on their problem list.  Patient reports current episode shingles around breast area, already finished Valtrex  regimen. Taking Tylenol  about 4000 mg per day also has Flexeril  as needed.  Has not been checking blood sugars consistenty during episode due to pain.   Contractions: Not present. Vag. Bleeding: None (brownish discharge, daily).  Movement: Absent. Denies leaking of fluid.   The following portions of the patient's history were reviewed and updated as appropriate: allergies, current medications, past family history, past medical history, past social history, past surgical history and problem list.   Objective:   Vitals:   09/27/23 1616  BP: 137/84  Pulse: 69  Weight: 245 lb 9.6 oz (111.4 kg)    Fetal Status: Fetal Heart Rate (bpm): 157   Movement: Absent     General:  Alert, oriented and cooperative. Patient is in no acute distress.  Skin: Skin is warm and dry. No rash noted.   Cardiovascular: Normal heart rate noted  Respiratory: Normal respiratory effort, no problems with respiration noted  Abdomen: Soft, gravid, appropriate for gestational age.  Pain/Pressure: Present (lower abdomen)      Pelvic: Cervical exam deferred        Extremities: Normal range of motion.  Edema: Trace  Mental Status: Normal mood and affect. Normal behavior. Normal judgment and thought content.   US  THYROID  Result Date: 09/03/2023 CLINICAL DATA:  Prior ultrasound follow-up. EXAM: THYROID  ULTRASOUND TECHNIQUE: Ultrasound examination of the thyroid  gland and adjacent soft tissues was performed. COMPARISON:  01/07/2021, 01/23/2021, 09/17/2021, 10/07/2021, 09/15/2022 FINDINGS: Parenchymal Echotexture: Markedly heterogeneous Isthmus: 1.0 cm ,previously 1.0 cm Right lobe: 8.6 x 3.3 x 4.1 cm ,previously 8.6 x 3.2 x 3.3 cm Left lobe: 8.7 x 3.2 x 4.1 cm ,previously 7.2 x 3.1 x 3.3 cm ________________________________________________________ Estimated total number of nodules >/= 1 cm: 6-10 Number of spongiform nodules >/=  2 cm not described below (TR1): 0 Number of mixed cystic and solid nodules >/= 1.5 cm not described below (TR2): 0 _________________________________________________________ Overall similar appearance of multifocal pseudo nodularity, primarily with spongiform and solid characteristics. Similar appearance of the previously biopsied right inferior and left superior thyroid  gland. No evidence of new discrete nodularity. No cervical lymphadenopathy. IMPRESSION: Similar appearing pseudonodular goiter. No new discrete nodules. Recommend correlation with prior biopsy results. The above is in keeping with the ACR TI-RADS recommendations - J Am Coll Radiol 2017;14:587-595. Ester Sides, MD [facility] Radiology Electronically Signed   By: Ester Sides M.D.   On: 09/03/2023 06:20    Assessment and Plan:  Pregnancy: G2P0010 at [redacted]w[redacted]d 1. History of ST elevation myocardial infarction (Primary) 2. Preexisting hypertension complicating pregnancy, antepartum Already followed by Dr. Sheena in Cardiology, on [place], Amlodipine  and Aspirin . Will continue to follow their  recommendations.  3. Preexisting diabetes complicating pregnancy, antepartum CBGs recordered from CGM were mostly with range.  Will continue current insulin  regimen. Awaiting fetal ECHO appointment.  4. Herpes zoster with other complication Resolving, current analgesia as needed.  5. [redacted] weeks gestation of pregnancy 6. Supervision of high risk pregnancy, antepartum Declined AFP, LR NIPS. Anatomy scan already scheduled No other complaints or concerns.  Routine obstetric precautions reviewed.  Please refer to After Visit Summary for other counseling recommendations.   Return in about 4 weeks (around 10/25/2023) for OFFICE OB VISIT (MD only).  Future Appointments  Date Time Provider Department Center  09/28/2023  3:00 PM Tobb, Kardie, DO [facility] None  10/04/2023  3:00 PM [facility] PHARMACIST [facility] None  10/10/2023  9:15 AM [facility] NURSE [facility]  10/10/2023  9:30 AM [facility] US4 [facility]  10/24/2023  4:10 PM Erik Kieth BROCKS, MD [facility]    Gloris Hugger, MD

## 2023-09-28 ENCOUNTER — Ambulatory Visit: Payer: 59 | Attending: Cardiology | Admitting: Cardiology

## 2023-09-28 ENCOUNTER — Encounter: Payer: Self-pay | Admitting: Cardiology

## 2023-09-28 VITALS — BP 132/80 | HR 72 | Ht 64.0 in | Wt 244.4 lb

## 2023-09-28 DIAGNOSIS — O10919 Unspecified pre-existing hypertension complicating pregnancy, unspecified trimester: Secondary | ICD-10-CM | POA: Diagnosis not present

## 2023-09-28 DIAGNOSIS — I2510 Atherosclerotic heart disease of native coronary artery without angina pectoris: Secondary | ICD-10-CM

## 2023-09-28 DIAGNOSIS — I10 Essential (primary) hypertension: Secondary | ICD-10-CM

## 2023-09-28 DIAGNOSIS — Z3A17 17 weeks gestation of pregnancy: Secondary | ICD-10-CM

## 2023-09-28 DIAGNOSIS — O99210 Obesity complicating pregnancy, unspecified trimester: Secondary | ICD-10-CM | POA: Diagnosis not present

## 2023-09-28 NOTE — Patient Instructions (Signed)
 Medication Instructions:  Your physician recommends that you continue on your current medications as directed. Please refer to the Current Medication list given to you today.  *If you need a refill on your cardiac medications before your next appointment, please call your pharmacy*   Follow-Up: At [facility], you and your health needs are our priority.  As part of our continuing mission to provide you with exceptional heart care, we have created designated Provider Care Teams.  These Care Teams include your primary Cardiologist (physician) and Advanced Practice Providers (APPs -  Physician Assistants and Nurse Practitioners) who all work together to provide you with the care you need, when you need it.   Your next appointment:   4 week(s)  Provider:   Kardie Tobb, DO     Other Instructions You are invited to attend: [facility] event on February Friday 7th 2025 at [facility] ([address] [place], [place], [place] [address]) from 8am-12pm. Feel free to invite other women to attend!  See you there!

## 2023-09-29 NOTE — Progress Notes (Signed)
 Cardio-Obstetrics Clinic  Follow Up Note   Date:  09/29/2023   ID:  [name], DOB January 07, 1981, MRN [id number]  PCP:  Stephane Leita DEL, MD   [facility] Providers Cardiologist:  Dub Huntsman, DO  Electrophysiologist:  None        Referring MD: Stephane Leita DEL, MD   Chief Complaint:  I am ok  History of Present Illness:    [name] is a 43 y.o. female [G2P0010] who returns for follow up at [redacted] weeks pregnant.  Medical history includes diabetes mellitus type 2, obesity she is status post lap band in 2009, coronary artery disease status post inferior wall MI at the age of 75, status post CABG (LIMA to LAD, left radial to diagonal, SVG to sequential to PDA and PLb this was done in November 2018, hypertension, hyperlipidemia.   She presents for a routine prenatal visit at [redacted] weeks gestation. She reports feeling the baby move a lot and describes it as the baby stretching. She denies any chest pain or shortness of breath, but occasionally feels a little something, which she does not elaborate on. She is also trying to stay active and has set goals with a dietician to walk three times a week for thirty minutes and gradually increase to five times a week. She is also under the care of a physician and midwives for her pregnancy. Her main complaint today is exhaustion, which she attributes to her pregnancy. She also reports occasional stomach discomfort after eating.  Prior CV Studies Reviewed: The following studies were reviewed today: Reviewed echo  Past Medical History:  Diagnosis Date   Anxiety    Back pain    Chest pain    Coronary artery disease    Coronary artery disease involving coronary bypass graft of native heart with angina pectoris (HCC) 08/02/2017   Diabetes mellitus    Difficulty swallowing pills    Fatigue    GERD (gastroesophageal reflux disease)    Gout    Hay fever    Headache    Heartburn    History of heart attack    History of  varicella    HTN (hypertension)    Hyperlipidemia    Joint pain    Knee pain    Nervousness    Palpitations    Pure hypercholesterolemia with target low density lipoprotein (LDL) cholesterol less than 70 mg/dL 94/98/7980   Shortness of breath on exertion    Sickle cell trait (HCC) 09/03/2023   Vitamin D  deficiency 11/20/2018    Past Surgical History:  Procedure Laterality Date   CORONARY ARTERY BYPASS GRAFT N/A 08/02/2017   Procedure: CORONARY ARTERY BYPASS GRAFTING (CABG) X 5 (LIMA to LAD, LEFT RADIAL ARTERY to DIAGONAL, SVG to SEQUENTIALLY PDA and PLB) , USING LEFT INTERNAL MAMMARY ARTERY, LEFT RADIAL ARTERY, AND  SAPHENOUS VEIN to CIRCUMFLEX, RIGHT GREATER SAPHENOUS VEIN HARVESTED ENDOSCOPICALLY;  Surgeon: Army Dallas NOVAK, MD;  Location: [facility] OR;  Service: Open Heart Surgery;  Laterality: N/A;   CORONARY/GRAFT ACUTE MI REVASCULARIZATION N/A 07/24/2017   Procedure: Coronary/Graft Acute MI Revascularization;  Surgeon: Claudene Victory ORN, MD;  Location: [facility] INVASIVE CV LAB;  Service: Cardiovascular;  Laterality: N/A;   LAPAROSCOPIC GASTRIC BANDING  09/02/2008   LEFT HEART CATH AND CORONARY ANGIOGRAPHY N/A 07/24/2017   Procedure: LEFT HEART CATH AND CORONARY ANGIOGRAPHY;  Surgeon: Claudene Victory ORN, MD;  Location: [facility] INVASIVE CV LAB;  Service: Cardiovascular;  Laterality: N/A;   RADIAL ARTERY HARVEST Left 08/02/2017  Procedure: LEFT RADIAL ARTERY HARVEST;  Surgeon: Army Dallas NOVAK, MD;  Location: [facility] OR;  Service: Open Heart Surgery;  Laterality: Left;   STERNAL CLOSURE N/A 08/02/2017   Procedure: STERNAL PLATING;  Surgeon: Army Dallas NOVAK, MD;  Location: [facility] OR;  Service: Open Heart Surgery;  Laterality: N/A;   STOMACH SURGERY  1982   had metal removed from stomach as a child   TEE WITHOUT CARDIOVERSION N/A 08/02/2017   Procedure: TRANSESOPHAGEAL ECHOCARDIOGRAM (TEE);  Surgeon: Army Dallas NOVAK, MD;  Location: [facility] OR;  Service: Open Heart Surgery;  Laterality: N/A;      OB History      Gravida  2   Para  0   Term  0   Preterm  0   AB  1   Living  0      SAB  1   IAB  0   Ectopic  0   Multiple  0   Live Births  0               Current Medications: Current Meds  Medication Sig   acetaminophen  (TYLENOL ) 500 MG tablet Take 500 mg by mouth every 6 (six) hours as needed for mild pain (pain score 1-3) or headache.   aspirin  EC 81 MG tablet Take 2 tablets (162 mg total) by mouth daily.   BD PEN NEEDLE NANO 2ND GEN 32G X 4 MM MISC See admin instructions.   cholecalciferol (VITAMIN D3) 25 MCG (1000 UNIT) tablet Take 1,000 Units by mouth daily.   clobetasol  (OLUX ) 0.05 % topical foam Apply 1 application topically as directed.   cyclobenzaprine  (FLEXERIL ) 10 MG tablet Take 1 tablet (10 mg total) by mouth 3 (three) times daily.   insulin  aspart (NOVOLOG  FLEXPEN) 100 UNIT/ML FlexPen Take 3-5 units before each meal depending on the sugar range   LANTUS  SOLOSTAR 100 UNIT/ML Solostar Pen Inject into the skin. PATIENT USES 12 UNITS IN THE MORNING AND 12 UNITS AT NIGHT.   metoprolol  tartrate (LOPRESSOR ) 25 MG tablet Take 1 tablet (25 mg total) by mouth 2 (two) times daily.   polycarbophil (FIBERCON) 625 MG tablet Take 625 mg by mouth daily.   Prenatal 28-0.8 MG TABS Take 1 tablet by mouth daily.   Turmeric 500 MG CAPS See admin instructions.   vitamin B-12 (CYANOCOBALAMIN ) 100 MCG tablet Take 100 mcg by mouth daily.     Allergies:   Patient has no known allergies.   Social History   Socioeconomic History   Marital status: Married    Spouse name: Lilyonna Steidle   Number of children: Not on file   Years of education: Not on file   Highest education level: Not on file  Occupational History   Occupation: Retail Banker: [employer]  Tobacco Use   Smoking status: Never   Smokeless tobacco: Never  Vaping Use   Vaping status: Never Used  Substance and Sexual Activity   Alcohol use: Not Currently    Comment: occ   Drug use:  No   Sexual activity: Not Currently  Other Topics Concern   Not on file  Social History Narrative   Not on file   Social Drivers of Health   Financial Resource Strain: Not on file  Food Insecurity: No Food Insecurity (08/17/2023)   Hunger Vital Sign    Worried About Running Out of Food in the Last Year: Never true    Ran Out of Food in the Last Year: Never  true  Transportation Needs: No Transportation Needs (08/17/2023)   PRAPARE - Administrator, Civil Service (Medical): No    Lack of Transportation (Non-Medical): No  Physical Activity: Not on file  Stress: Not on file  Social Connections: Not on file      Family History  Problem Relation Age of Onset   Hyperlipidemia Mother    Hypertension Mother    Obesity Mother    Diabetes Father    Hypertension Father    Heart disease Father    Sudden death Father 79       died of MI   Cancer Maternal Grandmother        lung   Cancer Maternal Grandfather        prostate      ROS:   Please see the history of present illness.     All other systems reviewed and are negative.   Labs/EKG Reviewed:    EKG:   EKG was not ordered today.    Recent Labs: 08/22/2023: ALT 6; BUN 11; Creatinine, Ser 0.79; Hemoglobin 14.0; Platelets 222; Potassium 4.2; Sodium 134; TSH 0.070   Recent Lipid Panel Lab Results  Component Value Date/Time   CHOL 141 05/20/2023 10:03 AM   TRIG 62 05/20/2023 10:03 AM   HDL 49 05/20/2023 10:03 AM   CHOLHDL 2.9 05/20/2023 10:03 AM   CHOLHDL 3.6 07/26/2017 02:50 AM   LDLCALC 79 05/20/2023 10:03 AM    Physical Exam:    VS:  BP 132/80   Pulse 72   Ht 5' 4 (1.626 m)   Wt 244 lb 6.4 oz (110.9 kg)   LMP 05/27/2023 (Exact Date)   SpO2 99%   BMI 41.95 kg/m     Wt Readings from Last 3 Encounters:  09/28/23 244 lb 6.4 oz (110.9 kg)  09/27/23 245 lb 9.6 oz (111.4 kg)  09/11/23 241 lb (109.3 kg)     GEN:  Well nourished, well developed in no acute distress HEENT: Normal NECK: No JVD;  No carotid bruits LYMPHATICS: No lymphadenopathy CARDIAC: RRR, no murmurs, rubs, gallops RESPIRATORY:  Clear to auscultation without rales, wheezing or rhonchi  ABDOMEN: Soft, non-tender, non-distended MUSCULOSKELETAL:  No edema; No deformity  SKIN: Warm and dry NEUROLOGIC:  Alert and oriented x 3 PSYCHIATRIC:  Normal affect    Risk Assessment/Risk Calculators:                 ASSESSMENT & PLAN:    Pregnancy in the setting of CAD No chest pain suspected for anginal. Will continue to monitor her closely. She has been off atorvastatin , we discussed at her last visit risk and benefit of keeping statin on given high risk ASCVD but patient declined.  Patient prefers to discontinue statin therapy during pregnancy. --Consider restarting statin therapy postpartum depending on breastfeeding status. Will closely monitor if needed will increase metoprolol  if suspect angina. Continue Aspirin   for preeclampsia prophylaxis  CARPEG II Score = 8 points, 41 % primary cardiac event risk. WHO Class II-III, high risk pregnancy requiring frequent cardiology follow up.  She is following with [facility]  Prenatal care Continue with prenatal vitamins.  Hypertensive disorder in pregnancy is with chronic hypertension affecting pregnancy. -Monitor blood pressure daily at home. -Report any blood pressure readings consistently over 130 for three days or any reading over 140 immediately. -Refer to pharmacist for management of diabetes during pregnancy.  Physical Activity and Nutrition Patient has set goals with a dietician to walk three  times a week for 30 minutes, with a plan to increase to five times a week. -Continue with the current plan.  -Plan for more frequent visits as pregnancy progresses (every 4 weeks initially, then every 2 weeks closer to term).   Patient Instructions  Medication Instructions:  Your physician recommends that you continue on your current medications as  directed. Please refer to the Current Medication list given to you today.  *If you need a refill on your cardiac medications before your next appointment, please call your pharmacy*   Follow-Up: At [facility], you and your health needs are our priority.  As part of our continuing mission to provide you with exceptional heart care, we have created designated Provider Care Teams.  These Care Teams include your primary Cardiologist (physician) and Advanced Practice Providers (APPs -  Physician Assistants and Nurse Practitioners) who all work together to provide you with the care you need, when you need it.   Your next appointment:   4 week(s)  Provider:   Apollonia Amini, DO     Other Instructions You are invited to attend: [facility] event on February Friday 7th 2025 at [facility] ([address] [place], [place], [place] [address]) from 8am-12pm. Feel free to invite other women to attend!  See you there!       Dispo:  No follow-ups on file.   Medication Adjustments/Labs and Tests Ordered: Current medicines are reviewed at length with the patient today.  Concerns regarding medicines are outlined above.  Tests Ordered: No orders of the defined types were placed in this encounter.  Medication Changes: No orders of the defined types were placed in this encounter.

## 2023-10-04 ENCOUNTER — Ambulatory Visit: Payer: 59

## 2023-10-05 ENCOUNTER — Ambulatory Visit: Payer: 59 | Attending: Cardiology | Admitting: Pharmacist

## 2023-10-05 ENCOUNTER — Encounter: Payer: Self-pay | Admitting: Pharmacist

## 2023-10-05 VITALS — BP 122/83 | HR 70 | Wt 251.0 lb

## 2023-10-05 DIAGNOSIS — I10 Essential (primary) hypertension: Secondary | ICD-10-CM

## 2023-10-05 NOTE — Patient Instructions (Signed)
 It was nice meeting you today  Your blood pressure today looks good  Please continue your: Amlodipine  5mg  daily Metoprolol  25mg  twice a day  Continue to watch your diet. Try to limit the amount of salt and carbohydrates you are eating  Follow up with Dr Emmette Harms in 2 weeks and I will see you back in a month  Chris Keonta Alsip, PharmD, BCACP, CDCES, CPP [address] [place], [place], [address] Phone: [phone], Fax: [phone]

## 2023-10-05 NOTE — Progress Notes (Signed)
Patient ID: [name]                 DOB: 1981-03-07                      MRN: [id number]     HPI: [name] is a 43 y.o. female referred by Cardio OB clinic. Currently [redacted] weeks pregnant. PMH is significant for HTN, NSTEMI, T2DM and obesity. Currently recovering from Shingles infection.  Patient presents today in good spirits. Still experiencing pain from shingles.   Last blood pressure this morning was 124/71. BP cuff has been validated.  Blood glucose levels variable. Patient sees dietician and is currently managed on Novolog and Lantus. Has had hypoglycemia episodes at night.  Currently managed on amlodipine 5mg  daily and metoprolol tartrate 25mg  BID. Reports no adverse effects. Reports no chest pain, SOB, LEE.  Works for [employer].   Current HTN meds:  Amlodipine 5mg  daily Metoprolol 25mg  BID   Wt Readings from Last 3 Encounters:  09/28/23 244 lb 6.4 oz (110.9 kg)  09/27/23 245 lb 9.6 oz (111.4 kg)  09/11/23 241 lb (109.3 kg)   BP Readings from Last 3 Encounters:  09/28/23 132/80  09/27/23 137/84  09/11/23 (!) 141/73   Pulse Readings from Last 3 Encounters:  09/28/23 72  09/27/23 69  09/11/23 71    Renal function: CrCl cannot be calculated (Patient's most recent lab result is older than the maximum 21 days allowed.).  Past Medical History:  Diagnosis Date   Anxiety    Back pain    Chest pain    Coronary artery disease    Coronary artery disease involving coronary bypass graft of native heart with angina pectoris (HCC) 08/02/2017   Diabetes mellitus    Difficulty swallowing pills    Fatigue    GERD (gastroesophageal reflux disease)    Gout    Hay fever    Headache    Heartburn    History of heart attack    History of varicella    HTN (hypertension)    Hyperlipidemia    Joint pain    Knee pain    Nervousness    Palpitations    Pure hypercholesterolemia with target low density lipoprotein (LDL) cholesterol less than 70  mg/dL 29/52/8413   Shortness of breath on exertion    Sickle cell trait (HCC) 09/03/2023   Vitamin D deficiency 11/20/2018    Current Outpatient Medications on File Prior to Visit  Medication Sig Dispense Refill   acetaminophen (TYLENOL) 500 MG tablet Take 500 mg by mouth every 6 (six) hours as needed for mild pain (pain score 1-3) or headache.     amLODipine (NORVASC) 5 MG tablet Take 1 tablet (5 mg total) by mouth daily. 90 tablet 3   aspirin EC 81 MG tablet Take 2 tablets (162 mg total) by mouth daily. 300 tablet 2   BD PEN NEEDLE NANO 2ND GEN 32G X 4 MM MISC See admin instructions.     cholecalciferol (VITAMIN D3) 25 MCG (1000 UNIT) tablet Take 1,000 Units by mouth daily.     clobetasol (OLUX) 0.05 % topical foam Apply 1 application topically as directed. 50 g 3   cyclobenzaprine (FLEXERIL) 10 MG tablet Take 1 tablet (10 mg total) by mouth 3 (three) times daily. 20 tablet 0   insulin aspart (NOVOLOG FLEXPEN) 100 UNIT/ML FlexPen Take 3-5 units before each meal depending on the sugar range     LANTUS  SOLOSTAR 100 UNIT/ML Solostar Pen Inject into the skin. PATIENT USES 12 UNITS IN THE MORNING AND 12 UNITS AT NIGHT.     metoprolol tartrate (LOPRESSOR) 25 MG tablet Take 1 tablet (25 mg total) by mouth 2 (two) times daily. 180 tablet 3   polycarbophil (FIBERCON) 625 MG tablet Take 625 mg by mouth daily.     Prenatal 28-0.8 MG TABS Take 1 tablet by mouth daily.     Turmeric 500 MG CAPS See admin instructions.     vitamin B-12 (CYANOCOBALAMIN) 100 MCG tablet Take 100 mcg by mouth daily.     No current facility-administered medications on file prior to visit.    No Known Allergies   Assessment/Plan:  1. Hypertension -  Patient BP controlled today at 122/83. Feels well, other than residual zoster pain. Will continue current hypertensive regimen. Concern regarding patients hypoglycemic episodes at night. Educated patient on Rule of 15s and printed hand out. Follow up with Dr Servando Salina in 2 weeks  and myself in 4.  Continue metoprolol 25mg  BID Continue amlodipine 5mg  daily F/U with Dr Servando Salina in 2 weeks  Laural Golden, PharmD, BCACP, CDCES, CPP [address], [address] [place], [place], [address] Phone: [phone], Fax: [phone]

## 2023-10-10 ENCOUNTER — Ambulatory Visit: Payer: 59 | Attending: Obstetrics & Gynecology

## 2023-10-10 ENCOUNTER — Ambulatory Visit: Payer: 59 | Admitting: *Deleted

## 2023-10-10 ENCOUNTER — Other Ambulatory Visit: Payer: Self-pay | Admitting: *Deleted

## 2023-10-10 ENCOUNTER — Other Ambulatory Visit: Payer: Self-pay

## 2023-10-10 ENCOUNTER — Ambulatory Visit (HOSPITAL_BASED_OUTPATIENT_CLINIC_OR_DEPARTMENT_OTHER): Payer: 59 | Admitting: Obstetrics

## 2023-10-10 DIAGNOSIS — O99012 Anemia complicating pregnancy, second trimester: Secondary | ICD-10-CM

## 2023-10-10 DIAGNOSIS — O99842 Bariatric surgery status complicating pregnancy, second trimester: Secondary | ICD-10-CM | POA: Diagnosis present

## 2023-10-10 DIAGNOSIS — O09522 Supervision of elderly multigravida, second trimester: Secondary | ICD-10-CM | POA: Diagnosis not present

## 2023-10-10 DIAGNOSIS — Z3A18 18 weeks gestation of pregnancy: Secondary | ICD-10-CM | POA: Insufficient documentation

## 2023-10-10 DIAGNOSIS — O99212 Obesity complicating pregnancy, second trimester: Secondary | ICD-10-CM | POA: Diagnosis present

## 2023-10-10 DIAGNOSIS — O10912 Unspecified pre-existing hypertension complicating pregnancy, second trimester: Secondary | ICD-10-CM | POA: Diagnosis not present

## 2023-10-10 DIAGNOSIS — E119 Type 2 diabetes mellitus without complications: Secondary | ICD-10-CM

## 2023-10-10 DIAGNOSIS — D563 Thalassemia minor: Secondary | ICD-10-CM

## 2023-10-10 DIAGNOSIS — Z794 Long term (current) use of insulin: Secondary | ICD-10-CM

## 2023-10-10 DIAGNOSIS — O0990 Supervision of high risk pregnancy, unspecified, unspecified trimester: Secondary | ICD-10-CM | POA: Diagnosis present

## 2023-10-10 DIAGNOSIS — O24912 Unspecified diabetes mellitus in pregnancy, second trimester: Secondary | ICD-10-CM

## 2023-10-10 DIAGNOSIS — O10012 Pre-existing essential hypertension complicating pregnancy, second trimester: Secondary | ICD-10-CM

## 2023-10-10 DIAGNOSIS — O24112 Pre-existing diabetes mellitus, type 2, in pregnancy, second trimester: Secondary | ICD-10-CM | POA: Diagnosis not present

## 2023-10-10 DIAGNOSIS — O24319 Unspecified pre-existing diabetes mellitus in pregnancy, unspecified trimester: Secondary | ICD-10-CM | POA: Diagnosis present

## 2023-10-10 DIAGNOSIS — E669 Obesity, unspecified: Secondary | ICD-10-CM

## 2023-10-10 NOTE — Progress Notes (Signed)
MFM Note  [name] is currently at 19 weeks and 3 days.  She was seen due to advanced maternal age (43 years old), maternal obesity with a BMI of 41, history of lap band surgery in 2008, pregestational diabetes treated with Lantus and NovoLog insulin, and chronic hypertension treated with amlodipine and metoprolol.    Her hemoglobin A1c one month ago was 6.8%.  She reports that her fingerstick values have mostly been within normal limits.  She is taking 2 baby aspirin's daily for preeclampsia prophylaxis.  She had a cell free DNA test earlier in her pregnancy which indicated a low risk for trisomy 51, 6, and 13. A female fetus is predicted.   Based on the fetal biometry measurements obtained today, her EDC was changed to March 09, 2024, making her 18 weeks and 3 days today.  This is the first ultrasound that she has had in her pregnancy.  There were no obvious fetal anomalies noted on today's ultrasound exam.  Today's exam was limited due to maternal body habitus.  The limitations of ultrasound in the detection of all anomalies was discussed.  The following were discussed during today's consultation:  Pregestational diabetes  She was advised that the goals for her fingerstick values are fasting values of 90-95 or less and two-hour postprandials of 120 or less.    Should the majority of her fingerstick values be above these values, her insulin dose may have to be adjusted to help her achieve better glycemic control.   The patient was advised that getting her fingerstick values as close to these goals as possible would provide her with the most optimal obstetrical outcome.  Due to pregestational diabetes, she was referred to [facility] pediatric cardiology for a fetal echocardiogram.  Chronic hypertension in pregnancy  She was advised to continue taking amlodipine and metoprolol for blood pressure control throughout her pregnancy.    The goal for her blood pressures are 140/90 or less.     The increased risk of superimposed preeclampsia due to chronic hypertension and her underlying medical conditions was discussed.    She was advised to continue taking 2 tablets of baby aspirin daily for preeclampsia prophylaxis.  Advanced maternal age in pregnancy  The increased risk of fetal aneuploidy due to advanced maternal age was discussed.   Due to advanced maternal age, the patient was offered and declined an amniocentesis today for definitive diagnosis of fetal aneuploidy.  She is comfortable with her negative cell free DNA test and today's normal ultrasound.    Due to her underlying medical conditions, we will continue to follow her with growth ultrasounds throughout her pregnancy.    Weekly fetal testing should be started at around 32 weeks.    The patient was advised that the goal for her delivery would be at between 37 to 39 weeks.  However, delivery prior to this time may be necessary should she develop superimposed preeclampsia.    She will return to our office in 4 weeks for another ultrasound exam to confirm her dates and to complete the views of the fetal anatomy.    The patient stated that all of her questions were answered today.  A total of 60 minutes was spent counseling and coordinating the care for this patient.  Greater than 50% of the time was spent in direct face-to-face contact.

## 2023-10-24 ENCOUNTER — Ambulatory Visit (INDEPENDENT_AMBULATORY_CARE_PROVIDER_SITE_OTHER): Payer: 59 | Admitting: Obstetrics and Gynecology

## 2023-10-24 VITALS — BP 124/76 | HR 79 | Wt 253.0 lb

## 2023-10-24 DIAGNOSIS — Z3A20 20 weeks gestation of pregnancy: Secondary | ICD-10-CM

## 2023-10-24 DIAGNOSIS — I252 Old myocardial infarction: Secondary | ICD-10-CM

## 2023-10-24 DIAGNOSIS — D573 Sickle-cell trait: Secondary | ICD-10-CM

## 2023-10-24 DIAGNOSIS — O0990 Supervision of high risk pregnancy, unspecified, unspecified trimester: Secondary | ICD-10-CM

## 2023-10-24 DIAGNOSIS — F419 Anxiety disorder, unspecified: Secondary | ICD-10-CM

## 2023-10-24 DIAGNOSIS — O09522 Supervision of elderly multigravida, second trimester: Secondary | ICD-10-CM

## 2023-10-24 DIAGNOSIS — E669 Obesity, unspecified: Secondary | ICD-10-CM

## 2023-10-24 DIAGNOSIS — Z6841 Body Mass Index (BMI) 40.0 and over, adult: Secondary | ICD-10-CM

## 2023-10-24 DIAGNOSIS — E1169 Type 2 diabetes mellitus with other specified complication: Secondary | ICD-10-CM

## 2023-10-24 DIAGNOSIS — I10 Essential (primary) hypertension: Secondary | ICD-10-CM

## 2023-10-24 NOTE — Progress Notes (Signed)
ROB   Pt states blood sugar reading can sometimes get elevated will make adjustment in meal/snack will discuss details with provider.  Has dexcom  Dropped of FMLA forms.  Carrier screening results are still pending .

## 2023-10-24 NOTE — Progress Notes (Signed)
   PRENATAL VISIT NOTE  Subjective:  [name] is a 43 y.o. G2P0010 at [redacted]w[redacted]d being seen today for ongoing prenatal care.  She is currently monitored for the following issues for this high-risk pregnancy and has History of ST elevation myocardial infarction; Type 2 diabetes mellitus with obesity (HCC); Maternal morbid obesity, antepartum (HCC); Essential hypertension; History of gastric surgery; Supervision of high risk pregnancy, antepartum; Preexisting diabetes complicating pregnancy, antepartum; Preexisting hypertension complicating pregnancy, antepartum; AMA (advanced maternal age) 66+; Anxiety; Sickle cell trait (HCC); Maternal age 66+, multigravida, antepartum; Maternal obesity affecting pregnancy, antepartum; Cardiac complication; Vaginal discharge; and Amenorrhea on their problem list.  Patient reports no complaints. Denies CP/SOB/palpitations. Doing well overall Contractions: Not present. Vag. Bleeding: None.  Movement: Present. Denies leaking of fluid.   The following portions of the patient's history were reviewed and updated as appropriate: allergies, current medications, past family history, past medical history, past social history, past surgical history and problem list.   Objective:   Vitals:   10/24/23 1621  BP: 124/76  Pulse: 79  Weight: 253 lb (114.8 kg)    Fetal Status: Fetal Heart Rate (bpm): 154   Movement: Present     General:  Alert, oriented and cooperative. Patient is in no acute distress.  Skin: Skin is warm and dry. No rash noted.   Cardiovascular: Normal heart rate noted  Respiratory: Normal respiratory effort, no problems with respiration noted  Abdomen: Soft, gravid, appropriate for gestational age.  Pain/Pressure: Present      Assessment and Plan:  Pregnancy: G2P0010 at [redacted]w[redacted]d 1. Supervision of high risk pregnancy, antepartum (Primary) 2. [redacted] weeks gestation of pregnancy AFP discussed, pt declines Flu shot recommended, pt declines  3. Type 2  diabetes mellitus with obesity (HCC) A1c 6.8 on 12/2 Has dexcom, unable to review data today but pt reports she is overall doing well. Discussed goals for pregnancy is CGM range of 70-140.  F/w endocrinology monthly, next appt 2/26; reports they are titrating her insulin Current meds: lantus 12u BID, SSI with meals (minimum 4u TID) Fetal echo scheduled 2/12 Serial growth [name], weekly antenatal testing at 32 weeks Delivery 37-39wk  4. Essential hypertension BP well controlled on amlodipine 5mg  & metoprolol 25mg  BID ASA 162mg  Antenatal testing & delivery as outlined above F/w Dr. Servando Salina, next appt 2/5  5. History of ST elevation myocardial infarction S/p CABG 2018 CARPREG score 8 c/w 41% primary cardiac event risk No current symptoms F/w Dr. Servando Salina, next appt 2/5  6. BMI 40.0-44.9, adult (HCC)  7. Multigravida of advanced maternal age in second trimester NIPS LR Anatomy US normal but incomplete, follow up scheduled 2/17  8. Anxiety Reports stable mood, denies mood concerns right now  9. Sickle cell trait (HCC) FOB testing sent, result not yet received. Reviewed partner's chart with his permission Erskin Burnet) and no result recorded  Please refer to After Visit Summary for other counseling recommendations.   Return in about 4 weeks (around 11/21/2023) for return OB at 24 weeks.  Future Appointments  Date Time Provider Department Center  10/26/2023  1:20 PM Thomasene Ripple, DO [facility] None  11/07/2023  9:15 AM [facility] NURSE [facility]  11/07/2023  9:30 AM [facility] US2 [facility]  11/10/2023  3:30 PM [facility] PHARMACIST [facility] None    Lennart Pall, MD

## 2023-10-25 ENCOUNTER — Encounter: Payer: Self-pay | Admitting: *Deleted

## 2023-10-25 NOTE — Progress Notes (Signed)
Erroneous, scanner not working

## 2023-10-26 ENCOUNTER — Ambulatory Visit: Payer: 59 | Attending: Cardiology | Admitting: Cardiology

## 2023-10-26 ENCOUNTER — Encounter: Payer: Self-pay | Admitting: Cardiology

## 2023-10-26 VITALS — BP 128/78 | HR 114 | Ht 64.0 in | Wt 254.4 lb

## 2023-10-26 DIAGNOSIS — E669 Obesity, unspecified: Secondary | ICD-10-CM

## 2023-10-26 DIAGNOSIS — O99210 Obesity complicating pregnancy, unspecified trimester: Secondary | ICD-10-CM

## 2023-10-26 DIAGNOSIS — I2510 Atherosclerotic heart disease of native coronary artery without angina pectoris: Secondary | ICD-10-CM | POA: Diagnosis not present

## 2023-10-26 DIAGNOSIS — Z3A20 20 weeks gestation of pregnancy: Secondary | ICD-10-CM

## 2023-10-26 DIAGNOSIS — E1169 Type 2 diabetes mellitus with other specified complication: Secondary | ICD-10-CM

## 2023-10-26 DIAGNOSIS — O10919 Unspecified pre-existing hypertension complicating pregnancy, unspecified trimester: Secondary | ICD-10-CM

## 2023-10-26 NOTE — Patient Instructions (Addendum)
 Medication Instructions:  Your physician recommends that you continue on your current medications as directed. Please refer to the Current Medication list given to you today.  *If you need a refill on your cardiac medications before your next appointment, please call your pharmacy*  Follow-Up: At [facility], you and your health needs are our priority.  As part of our continuing mission to provide you with exceptional heart care, we have created designated Provider Care Teams.  These Care Teams include your primary Cardiologist (physician) and Advanced Practice Providers (APPs -  Physician Assistants and Nurse Practitioners) who all work together to provide you with the care you need, when you need it.  Your next appointment:   4 week(s) - overbook ok  Provider:   Kardie Tobb, DO     Other Instructions

## 2023-10-26 NOTE — Progress Notes (Signed)
 Cardio-Obstetrics Clinic  Follow Up Note   Date:  10/26/2023   ID:  [name], DOB 1981-07-09, MRN [id number]  PCP:  Stephane Leita DEL, MD   [facility] Providers Cardiologist:  Dub Huntsman, DO  Electrophysiologist:  None        Referring MD: Stephane Leita DEL, MD   Chief Complaint:  I am ok  History of Present Illness:    [name] is a 43 y.o. female [G2P0010] who returns for follow up at [redacted] weeks pregnant.  Medical history includes diabetes mellitus type 2, obesity she is status post lap band in 2009, coronary artery disease status post inferior wall MI at the age of 29, status post CABG (LIMA to LAD, left radial to diagonal, SVG to sequential to PDA and PLb this was done in November 2018, hypertension, hyperlipidemia.   She presents for a routine prenatal visit at [redacted] weeks gestation.  She is excited.  She offers no complaints at this time.  She is doing well.   Prior CV Studies Reviewed: The following studies were reviewed today: Reviewed echo  Past Medical History:  Diagnosis Date   Anxiety    Back pain    Chest pain    Coronary artery disease    Coronary artery disease involving coronary bypass graft of native heart with angina pectoris (HCC) 08/02/2017   Diabetes mellitus    Difficulty swallowing pills    Fatigue    GERD (gastroesophageal reflux disease)    Gout    Hay fever    Headache    Heartburn    History of heart attack    History of varicella    HTN (hypertension)    Hyperlipidemia    Joint pain    Knee pain    Nervousness    Palpitations    Pure hypercholesterolemia with target low density lipoprotein (LDL) cholesterol less than 70 mg/dL 94/98/7980   Shortness of breath on exertion    Sickle cell trait (HCC) 09/03/2023   Vitamin D  deficiency 11/20/2018    Past Surgical History:  Procedure Laterality Date   CORONARY ARTERY BYPASS GRAFT N/A 08/02/2017   Procedure: CORONARY ARTERY BYPASS GRAFTING (CABG) X 5 (LIMA to  LAD, LEFT RADIAL ARTERY to DIAGONAL, SVG to SEQUENTIALLY PDA and PLB) , USING LEFT INTERNAL MAMMARY ARTERY, LEFT RADIAL ARTERY, AND  SAPHENOUS VEIN to CIRCUMFLEX, RIGHT GREATER SAPHENOUS VEIN HARVESTED ENDOSCOPICALLY;  Surgeon: Army Dallas NOVAK, MD;  Location: [facility] OR;  Service: Open Heart Surgery;  Laterality: N/A;   CORONARY/GRAFT ACUTE MI REVASCULARIZATION N/A 07/24/2017   Procedure: Coronary/Graft Acute MI Revascularization;  Surgeon: Claudene Victory ORN, MD;  Location: [facility] INVASIVE CV LAB;  Service: Cardiovascular;  Laterality: N/A;   LAPAROSCOPIC GASTRIC BANDING  09/02/2008   LEFT HEART CATH AND CORONARY ANGIOGRAPHY N/A 07/24/2017   Procedure: LEFT HEART CATH AND CORONARY ANGIOGRAPHY;  Surgeon: Claudene Victory ORN, MD;  Location: [facility] INVASIVE CV LAB;  Service: Cardiovascular;  Laterality: N/A;   RADIAL ARTERY HARVEST Left 08/02/2017   Procedure: LEFT RADIAL ARTERY HARVEST;  Surgeon: Army Dallas NOVAK, MD;  Location: [facility] OR;  Service: Open Heart Surgery;  Laterality: Left;   STERNAL CLOSURE N/A 08/02/2017   Procedure: STERNAL PLATING;  Surgeon: Army Dallas NOVAK, MD;  Location: [facility] OR;  Service: Open Heart Surgery;  Laterality: N/A;   STOMACH SURGERY  1982   had metal removed from stomach as a child   TEE WITHOUT CARDIOVERSION N/A 08/02/2017   Procedure: TRANSESOPHAGEAL ECHOCARDIOGRAM (TEE);  Surgeon: Army Dallas NOVAK, MD;  Location: [facility] OR;  Service: Open Heart Surgery;  Laterality: N/A;      OB History     Gravida  2   Para  0   Term  0   Preterm  0   AB  1   Living  0      SAB  1   IAB  0   Ectopic  0   Multiple  0   Live Births  0               Current Medications: Current Meds  Medication Sig   acetaminophen  (TYLENOL ) 500 MG tablet Take 500 mg by mouth every 6 (six) hours as needed for mild pain (pain score 1-3) or headache.   aspirin  EC 81 MG tablet Take 2 tablets (162 mg total) by mouth daily.   BD PEN NEEDLE NANO 2ND GEN 32G X 4 MM MISC See admin instructions.    cholecalciferol (VITAMIN D3) 25 MCG (1000 UNIT) tablet Take 1,000 Units by mouth daily.   clobetasol  (OLUX ) 0.05 % topical foam Apply 1 application topically as directed.   insulin  aspart (NOVOLOG  FLEXPEN) 100 UNIT/ML FlexPen Take 3-5 units before each meal depending on the sugar range   LANTUS  SOLOSTAR 100 UNIT/ML Solostar Pen Inject into the skin. PATIENT USES 12 UNITS IN THE MORNING AND 12 UNITS AT NIGHT.   polycarbophil (FIBERCON) 625 MG tablet Take 625 mg by mouth daily.   Prenatal 28-0.8 MG TABS Take 1 tablet by mouth daily.   vitamin B-12 (CYANOCOBALAMIN ) 100 MCG tablet Take 100 mcg by mouth daily.     Allergies:   Patient has no known allergies.   Social History   Socioeconomic History   Marital status: Married    Spouse name: Zelena Bushong   Number of children: Not on file   Years of education: Not on file   Highest education level: Not on file  Occupational History   Occupation: Retail Banker: [employer]  Tobacco Use   Smoking status: Never   Smokeless tobacco: Never  Vaping Use   Vaping status: Never Used  Substance and Sexual Activity   Alcohol use: Not Currently    Comment: occ   Drug use: No   Sexual activity: Not Currently  Other Topics Concern   Not on file  Social History Narrative   Not on file   Social Drivers of Health   Financial Resource Strain: Not on file  Food Insecurity: No Food Insecurity (08/17/2023)   Hunger Vital Sign    Worried About Running Out of Food in the Last Year: Never true    Ran Out of Food in the Last Year: Never true  Transportation Needs: No Transportation Needs (08/17/2023)   PRAPARE - Administrator, Civil Service (Medical): No    Lack of Transportation (Non-Medical): No  Physical Activity: Not on file  Stress: Not on file  Social Connections: Not on file      Family History  Problem Relation Age of Onset   Hyperlipidemia Mother    Hypertension Mother    Obesity Mother     Diabetes Father    Hypertension Father    Heart disease Father    Sudden death Father 1       died of MI   Cancer Maternal Grandmother        lung   Cancer Maternal Grandfather  prostate      ROS:   Please see the history of present illness.     All other systems reviewed and are negative.   Labs/EKG Reviewed:    EKG:   EKG was not ordered today.    Recent Labs: 08/22/2023: ALT 6; BUN 11; Creatinine, Ser 0.79; Hemoglobin 14.0; Platelets 222; Potassium 4.2; Sodium 134; TSH 0.070   Recent Lipid Panel Lab Results  Component Value Date/Time   CHOL 141 05/20/2023 10:03 AM   TRIG 62 05/20/2023 10:03 AM   HDL 49 05/20/2023 10:03 AM   CHOLHDL 2.9 05/20/2023 10:03 AM   CHOLHDL 3.6 07/26/2017 02:50 AM   LDLCALC 79 05/20/2023 10:03 AM    Physical Exam:    VS:  BP 128/78 (BP Location: Right Arm, Patient Position: Sitting, Cuff Size: Large)   Pulse (!) 114   Ht 5' 4 (1.626 m)   Wt 254 lb 6.4 oz (115.4 kg)   LMP 05/27/2023 (Exact Date)   SpO2 99%   BMI 43.67 kg/m     Wt Readings from Last 3 Encounters:  10/26/23 254 lb 6.4 oz (115.4 kg)  10/24/23 253 lb (114.8 kg)  10/05/23 251 lb (113.9 kg)     GEN:  Well nourished, well developed in no acute distress HEENT: Normal NECK: No JVD; No carotid bruits LYMPHATICS: No lymphadenopathy CARDIAC: RRR, no murmurs, rubs, gallops RESPIRATORY:  Clear to auscultation without rales, wheezing or rhonchi  ABDOMEN: Soft, non-tender, non-distended MUSCULOSKELETAL:  No edema; No deformity  SKIN: Warm and dry NEUROLOGIC:  Alert and oriented x 3 PSYCHIATRIC:  Normal affect    Risk Assessment/Risk Calculators:                 ASSESSMENT & PLAN:    Pregnancy in the setting of CAD No chest pain suspected for anginal. Will continue to monitor her closely. She has been off atorvastatin , we discussed at her last visit risk and benefit of keeping statin on given high risk ASCVD but patient declined.  Patient prefers to  discontinue statin therapy during pregnancy. --Consider restarting statin therapy postpartum depending on breastfeeding status. Will closely monitor if needed will increase metoprolol  if suspect angina. Continue Aspirin   for preeclampsia prophylaxis  CARPEG II Score = 8 points, 41 % primary cardiac event risk. WHO Class II-III, high risk pregnancy requiring frequent cardiology follow up.  She is following with [facility] OB specialty clinics  Prenatal care Continue with prenatal vitamins.  Hypertensive disorder in pregnancy is with chronic hypertension affecting pregnancy. -Monitor blood pressure daily at home. -Report any blood pressure readings consistently over 130 for three days or any reading over 140 immediately. -Refer to pharmacist for management of diabetes during pregnancy.  Physical Activity and Nutrition Patient has set goals with a dietician to walk three times a week for 30 minutes, with a plan to increase to five times a week. -Continue with the current plan.  -Plan for more frequent visits as pregnancy progresses (every 4 weeks initially, then every 2 weeks closer to term).   Patient Instructions  Medication Instructions:  Your physician recommends that you continue on your current medications as directed. Please refer to the Current Medication list given to you today.  *If you need a refill on your cardiac medications before your next appointment, please call your pharmacy*  Follow-Up: At [facility], you and your health needs are our priority.  As part of our continuing mission to provide you with exceptional heart care, we have created  designated Provider Care Teams.  These Care Teams include your primary Cardiologist (physician) and Advanced Practice Providers (APPs -  Physician Assistants and Nurse Practitioners) who all work together to provide you with the care you need, when you need it.  Your next appointment:   4 week(s) - overbook ok  Provider:    Peyten Weare, DO     Other Instructions     Dispo:  Return in about 4 weeks (around 11/23/2023).   Medication Adjustments/Labs and Tests Ordered: Current medicines are reviewed at length with the patient today.  Concerns regarding medicines are outlined above.  Tests Ordered: No orders of the defined types were placed in this encounter.  Medication Changes: No orders of the defined types were placed in this encounter.

## 2023-11-07 ENCOUNTER — Ambulatory Visit: Payer: 59

## 2023-11-07 ENCOUNTER — Ambulatory Visit: Payer: 59 | Attending: Obstetrics | Admitting: *Deleted

## 2023-11-07 ENCOUNTER — Ambulatory Visit (HOSPITAL_BASED_OUTPATIENT_CLINIC_OR_DEPARTMENT_OTHER): Payer: 59 | Admitting: Maternal & Fetal Medicine

## 2023-11-07 VITALS — BP 124/66 | HR 79

## 2023-11-07 DIAGNOSIS — O99842 Bariatric surgery status complicating pregnancy, second trimester: Secondary | ICD-10-CM | POA: Diagnosis not present

## 2023-11-07 DIAGNOSIS — O09522 Supervision of elderly multigravida, second trimester: Secondary | ICD-10-CM

## 2023-11-07 DIAGNOSIS — O99012 Anemia complicating pregnancy, second trimester: Secondary | ICD-10-CM

## 2023-11-07 DIAGNOSIS — I252 Old myocardial infarction: Secondary | ICD-10-CM

## 2023-11-07 DIAGNOSIS — Z3A22 22 weeks gestation of pregnancy: Secondary | ICD-10-CM

## 2023-11-07 DIAGNOSIS — E119 Type 2 diabetes mellitus without complications: Secondary | ICD-10-CM

## 2023-11-07 DIAGNOSIS — O99210 Obesity complicating pregnancy, unspecified trimester: Secondary | ICD-10-CM

## 2023-11-07 DIAGNOSIS — O24912 Unspecified diabetes mellitus in pregnancy, second trimester: Secondary | ICD-10-CM

## 2023-11-07 DIAGNOSIS — O0990 Supervision of high risk pregnancy, unspecified, unspecified trimester: Secondary | ICD-10-CM

## 2023-11-07 DIAGNOSIS — Z794 Long term (current) use of insulin: Secondary | ICD-10-CM

## 2023-11-07 DIAGNOSIS — O99212 Obesity complicating pregnancy, second trimester: Secondary | ICD-10-CM | POA: Diagnosis not present

## 2023-11-07 DIAGNOSIS — O24112 Pre-existing diabetes mellitus, type 2, in pregnancy, second trimester: Secondary | ICD-10-CM

## 2023-11-07 DIAGNOSIS — E669 Obesity, unspecified: Secondary | ICD-10-CM

## 2023-11-07 DIAGNOSIS — O10012 Pre-existing essential hypertension complicating pregnancy, second trimester: Secondary | ICD-10-CM | POA: Insufficient documentation

## 2023-11-07 DIAGNOSIS — Z362 Encounter for other antenatal screening follow-up: Secondary | ICD-10-CM | POA: Diagnosis not present

## 2023-11-07 DIAGNOSIS — O99843 Bariatric surgery status complicating pregnancy, third trimester: Secondary | ICD-10-CM

## 2023-11-07 DIAGNOSIS — Z148 Genetic carrier of other disease: Secondary | ICD-10-CM | POA: Diagnosis not present

## 2023-11-07 DIAGNOSIS — O10912 Unspecified pre-existing hypertension complicating pregnancy, second trimester: Secondary | ICD-10-CM

## 2023-11-07 DIAGNOSIS — D563 Thalassemia minor: Secondary | ICD-10-CM

## 2023-11-07 NOTE — Progress Notes (Signed)
Patient information  Patient Name: [name]  Patient MRN:   [id number]  Referring practice: MFM Referring Provider: [facility])  MFM CONSULT  [name] is a 43 y.o. G2P0010 at [redacted]w[redacted]d here for ultrasound and consultation. Patient Active Problem List   Diagnosis Date Noted   Maternal age 21+, multigravida, antepartum 09/11/2023   Maternal obesity affecting pregnancy, antepartum 09/11/2023   Cardiac complication 09/11/2023   Vaginal discharge 09/11/2023   Amenorrhea 09/11/2023   Sickle cell trait (HCC) 09/03/2023   Preexisting diabetes complicating pregnancy, antepartum 08/17/2023   Preexisting hypertension complicating pregnancy, antepartum 08/17/2023   AMA (advanced maternal age) 73+ 08/17/2023   Anxiety    Supervision of high risk pregnancy, antepartum 08/09/2023   History of gastric surgery 09/03/2019   Essential hypertension 09/05/2018   Maternal morbid obesity, antepartum (HCC) 07/13/2018   History of ST elevation myocardial infarction 07/24/2017   Type 2 diabetes mellitus with obesity (HCC) 07/24/2017   [name] is doing well today with no acute concerns.  RE type 2 diabetes: Fasting blood sugars 80-95, 2-hour postprandials 1 20-1 75.  Discussed the importance of proper glycemic control.  She will bring her blood sugar log to her physician and adjust insulin as instructed.  She is currently on 12 units of Lantus in the morning and night with a sliding scale of NovoLog at each meal depending upon her blood sugar range.  The estimated fetal weight is normal today.  RE history of hypertension and coronary artery disease: Blood pressure is at goal 124/66 today.  She is currently on aspirin, amlodipine and metoprolol.  She is follows with Dr. Servando Salina for history of coronary artery disease.  She is able to maintain a normal level of activity at her work without chest pain.  She is able to ambulate up nearly 20 steps without  shortness of breath that is beyond her normal range.  She knows to monitor for chest pain or worsening shortness of breath.  Sonographic findings Single intrauterine pregnancy at 22w 3d.  Fetal cardiac activity:  Observed and appears normal. Presentation: Cephalic. Interval fetal anatomy appears normal. Fetal biometry shows the estimated fetal weight at the 29 percentile. Amniotic fluid volume: Within normal limits. MVP: 3 cm. Placenta: Anterior.  There are limitations of prenatal ultrasound such as the inability to detect certain abnormalities due to poor visualization. Various factors such as fetal position, gestational age and maternal body habitus may increase the difficulty in visualizing the fetal anatomy.    Recommendations - Serial growth ultrasounds every 4-6 weeks until delivery - Antenatal testing to start around 32 weeks  - Continue glucose assessment with her OB provider and adjust insulin accordingly.  Dietary discretion encouraged and moderate exercise as tolerated.  - Blood pressure goal less than 130/80. - Follow-up with Dr. Servando Salina as needed.   - Delivery timing pending clinical course but likely around 37 weeks  Review of Systems: A review of systems was performed and was negative except per HPI   Vitals and Physical Exam    11/07/2023    9:21 AM 10/26/2023    1:21 PM 10/24/2023    4:21 PM  Vitals with BMI  Height  5\' 4"    Weight  254 lbs 6 oz 253 lbs  BMI  43.65   Systolic 124 128 409  Diastolic 66 78 76  Pulse 79 114 79   Sitting comfortably on the sonogram table Nonlabored breathing Normal rate and  rhythm Abdomen is nontender  Past pregnancies OB History  Gravida Para Term Preterm AB Living  2 0 0 0 1 0  SAB IAB Ectopic Multiple Live Births  1 0 0 0 0    # Outcome Date GA Lbr Len/2nd Weight Sex Type Anes PTL Lv  2 Current           1 SAB 11/2021 [redacted]w[redacted]d           I spent 20 minutes reviewing the patients chart, including labs and images as well as  counseling the patient about her medical conditions. Greater than 50% of the time was spent in direct face-to-face patient counseling.  Braxton Feathers  MFM, [facility]   11/07/2023  10:48 AM

## 2023-11-10 ENCOUNTER — Ambulatory Visit: Payer: 59

## 2023-11-16 ENCOUNTER — Other Ambulatory Visit: Payer: Self-pay

## 2023-11-16 DIAGNOSIS — O24319 Unspecified pre-existing diabetes mellitus in pregnancy, unspecified trimester: Secondary | ICD-10-CM

## 2023-11-16 DIAGNOSIS — O10919 Unspecified pre-existing hypertension complicating pregnancy, unspecified trimester: Secondary | ICD-10-CM

## 2023-11-16 DIAGNOSIS — O09522 Supervision of elderly multigravida, second trimester: Secondary | ICD-10-CM

## 2023-11-16 DIAGNOSIS — O99210 Obesity complicating pregnancy, unspecified trimester: Secondary | ICD-10-CM

## 2023-11-17 ENCOUNTER — Other Ambulatory Visit: Payer: Self-pay

## 2023-11-17 ENCOUNTER — Ambulatory Visit: Payer: 59 | Admitting: Obstetrics and Gynecology

## 2023-11-17 VITALS — BP 120/80 | HR 68 | Wt 257.0 lb

## 2023-11-17 DIAGNOSIS — O99210 Obesity complicating pregnancy, unspecified trimester: Secondary | ICD-10-CM

## 2023-11-17 DIAGNOSIS — D573 Sickle-cell trait: Secondary | ICD-10-CM

## 2023-11-17 DIAGNOSIS — O09522 Supervision of elderly multigravida, second trimester: Secondary | ICD-10-CM

## 2023-11-17 DIAGNOSIS — I519 Heart disease, unspecified: Secondary | ICD-10-CM

## 2023-11-17 DIAGNOSIS — O0990 Supervision of high risk pregnancy, unspecified, unspecified trimester: Secondary | ICD-10-CM

## 2023-11-17 DIAGNOSIS — Z3A23 23 weeks gestation of pregnancy: Secondary | ICD-10-CM | POA: Diagnosis not present

## 2023-11-17 DIAGNOSIS — O10912 Unspecified pre-existing hypertension complicating pregnancy, second trimester: Secondary | ICD-10-CM | POA: Diagnosis not present

## 2023-11-17 DIAGNOSIS — O24319 Unspecified pre-existing diabetes mellitus in pregnancy, unspecified trimester: Secondary | ICD-10-CM

## 2023-11-17 DIAGNOSIS — O99012 Anemia complicating pregnancy, second trimester: Secondary | ICD-10-CM | POA: Diagnosis not present

## 2023-11-17 DIAGNOSIS — O99212 Obesity complicating pregnancy, second trimester: Secondary | ICD-10-CM

## 2023-11-17 DIAGNOSIS — O10919 Unspecified pre-existing hypertension complicating pregnancy, unspecified trimester: Secondary | ICD-10-CM

## 2023-11-17 DIAGNOSIS — I252 Old myocardial infarction: Secondary | ICD-10-CM | POA: Diagnosis not present

## 2023-11-17 DIAGNOSIS — E1169 Type 2 diabetes mellitus with other specified complication: Secondary | ICD-10-CM

## 2023-11-17 DIAGNOSIS — Z9889 Other specified postprocedural states: Secondary | ICD-10-CM

## 2023-11-17 DIAGNOSIS — Z6841 Body Mass Index (BMI) 40.0 and over, adult: Secondary | ICD-10-CM | POA: Insufficient documentation

## 2023-11-17 NOTE — Progress Notes (Signed)
 PRENATAL VISIT NOTE  Subjective:  [name] is a 43 y.o. G2P0010 at [redacted]w[redacted]d being seen today for ongoing prenatal care.  She is currently monitored for the following issues for this high-risk pregnancy and has History of ST elevation myocardial infarction; Type 2 diabetes mellitus with obesity (HCC); Essential hypertension; History of gastric surgery; Supervision of high risk pregnancy, antepartum; Preexisting diabetes complicating pregnancy, antepartum; Preexisting hypertension complicating pregnancy, antepartum; AMA (advanced maternal age) 61+; Anxiety; Sickle cell trait (HCC); Maternal obesity affecting pregnancy, antepartum; Cardiac complication; and BMI 40.0-44.9, adult (HCC) on their problem list.  Patient reports no complaints.  Contractions: Not present. Vag. Bleeding: None.  Movement: Present. Denies leaking of fluid.   The following portions of the patient's history were reviewed and updated as appropriate: allergies, current medications, past family history, past medical history, past social history, past surgical history and problem list.   Objective:   Vitals:   11/17/23 1401  BP: 120/80  Pulse: 68  Weight: 257 lb (116.6 kg)    Fetal Status: Fetal Heart Rate (bpm): 161   Movement: Present     General:  Alert, oriented and cooperative. Patient is in no acute distress.  Skin: Skin is warm and dry. No rash noted.   Cardiovascular: Normal heart rate noted  Respiratory: Normal respiratory effort, no problems with respiration noted  Abdomen: Soft, gravid, appropriate for gestational age.  Pain/Pressure: Absent     Pelvic: Cervical exam deferred        Extremities: Normal range of motion.     Mental Status: Normal mood and affect. Normal behavior. Normal judgment and thought content.   Assessment and Plan:  Pregnancy: G2P0010 at [redacted]w[redacted]d 1. Sickle cell trait (HCC) (Primary) Partner testing negative  2. [redacted] weeks gestation of pregnancy Recheck TFTs with 28wk  labs Ask more about birth control next visit   3. Preexisting hypertension complicating pregnancy, antepartum Followed by Dr. Servando Salina, last visit on 2/5. Patient doing on norvasc 5 qday and lopressor 25 bid and low dose ASA 162. Notify cards if SBP consistently in the 130s-140s.  -05/2023: LV w/ low normal EF & abnormal wall motion  4. History of ST elevation myocardial infarction See above  5. Cardiac complication See above  6. Preexisting diabetes complicating pregnancy, antepartum Followed endocrine. Pt on lantus 12 in morning and 12 at bedtime with carb counting at breakfast, lunch, and dinner, averaging around 10-12 per meal. She has a dexcom and am fasting and 2 hour post prandials generally within range.  -fetal echo: wnl 2/12  7. Type 2 diabetes mellitus with obesity (HCC)  8. Obesity affecting pregnancy, antepartum, unspecified obesity type Weight stable  9. BMI 40.0-44.9, adult (HCC)  10. History of gastric surgery H/o lap band   11. Multigravida of advanced maternal age in second trimester Low risk panorama  67. Supervision of high risk pregnancy, antepartum 2/17: 29%, 479gm, ac 29%, afi wnl.  Preterm labor symptoms and general obstetric precautions including but not limited to vaginal bleeding, contractions, leaking of fluid and fetal movement were reviewed in detail with the patient. Please refer to After Visit Summary for other counseling recommendations.   Return in about 3 weeks (around 12/08/2023) for in person, md visit, high risk ob.  Future Appointments  Date Time Provider Department Center  11/22/2023  4:20 PM Thomasene Ripple, DO [facility] None  12/05/2023  1:15 PM [facility] NURSE [facility]  12/05/2023  1:30 PM [facility] US2 [facility]  12/16/2023  8:55 AM  Federico Flake, MD [facility]  12/26/2023  9:30 AM Rosalee Kaufman, RPH-CPP [facility] None  12/29/2023  2:30 PM [place] Bing, MD [facility]  01/02/2024  9:15 AM [facility]  NURSE [facility]  01/02/2024  9:30 AM [facility] US1 [facility]  01/12/2024  3:30 PM Anyanwu, Jethro Bastos, MD [facility]  01/16/2024  8:15 AM [facility] NURSE [facility]  01/16/2024  8:30 AM [facility] US2 [facility]    [place] Bing, MD

## 2023-11-22 ENCOUNTER — Encounter: Payer: Self-pay | Admitting: Cardiology

## 2023-11-22 ENCOUNTER — Ambulatory Visit: Payer: 59 | Attending: Cardiology | Admitting: Cardiology

## 2023-11-22 VITALS — BP 108/66 | HR 74 | Ht 64.0 in | Wt 259.2 lb

## 2023-11-22 DIAGNOSIS — O10919 Unspecified pre-existing hypertension complicating pregnancy, unspecified trimester: Secondary | ICD-10-CM | POA: Diagnosis not present

## 2023-11-22 DIAGNOSIS — E669 Obesity, unspecified: Secondary | ICD-10-CM

## 2023-11-22 DIAGNOSIS — I2510 Atherosclerotic heart disease of native coronary artery without angina pectoris: Secondary | ICD-10-CM | POA: Diagnosis not present

## 2023-11-22 DIAGNOSIS — I10 Essential (primary) hypertension: Secondary | ICD-10-CM

## 2023-11-22 DIAGNOSIS — E1169 Type 2 diabetes mellitus with other specified complication: Secondary | ICD-10-CM | POA: Diagnosis not present

## 2023-11-22 DIAGNOSIS — Z3A13 13 weeks gestation of pregnancy: Secondary | ICD-10-CM

## 2023-11-22 NOTE — Patient Instructions (Addendum)
 Medication Instructions:  No Changes *If you need a refill on your cardiac medications before your next appointment, please call your pharmacy*   Lab Work: None If you have labs (blood work) drawn today and your tests are completely normal, you will receive your results only by: MyChart Message (if you have MyChart) OR A paper copy in the mail If you have any lab test that is abnormal or we need to change your treatment, we will call you to review the results.   Testing/Procedures: None   Follow-Up: At [facility], you and your health needs are our priority.  As part of our continuing mission to provide you with exceptional heart care, we have created designated Provider Care Teams.  These Care Teams include your primary Cardiologist (physician) and Advanced Practice Providers (APPs -  Physician Assistants and Nurse Practitioners) who all work together to provide you with the care you need, when you need it.  Your next appointment:   5 week(s) Okay to [facility] per Dr. Servando Salina  Provider:   Thomasene Ripple, DO

## 2023-11-23 NOTE — Progress Notes (Signed)
 Cardio-Obstetrics Clinic  Follow Up Note   Date:  11/23/2023   ID:  VERLINE KONG, DOB May 23, 1981, MRN [id number]  PCP:  Melida Quitter, MD   [facility] Providers Cardiologist:  Thomasene Ripple, DO  Electrophysiologist:  None        Referring MD: Melida Quitter, MD   Chief Complaint: " I am ok"  History of Present Illness:    [name] is a 43 y.o. female [G2P0010] who returns for follow up at [redacted] weeks pregnant.  Medical history includes diabetes mellitus type 2, obesity she is status post lap band in 2009, coronary artery disease status post inferior wall MI at the age of 28, status post CABG (LIMA to LAD, left radial to diagonal, SVG to sequential to PDA and PLb this was done in November 2018, hypertension, hyperlipidemia.   She presents for a routine prenatal visit at [redacted] weeks gestation.  She is excited.  She offers no complaints at this time.  She is doing well.   Prior CV Studies Reviewed: The following studies were reviewed today: Reviewed echo  Past Medical History:  Diagnosis Date   Anxiety    Back pain    Chest pain    Coronary artery disease    Coronary artery disease involving coronary bypass graft of native heart with angina pectoris (HCC) 08/02/2017   Diabetes mellitus    Difficulty swallowing pills    Fatigue    GERD (gastroesophageal reflux disease)    Gout    Hay fever    Headache    Heartburn    History of heart attack    History of varicella    HTN (hypertension)    Hyperlipidemia    Joint pain    Knee pain    Nervousness    Palpitations    Pure hypercholesterolemia with target low density lipoprotein (LDL) cholesterol less than 70 mg/dL 40/98/1191   Shortness of breath on exertion    Sickle cell trait (HCC) 09/03/2023   Vitamin D deficiency 11/20/2018    Past Surgical History:  Procedure Laterality Date   CORONARY ARTERY BYPASS GRAFT N/A 08/02/2017   Procedure: CORONARY ARTERY BYPASS GRAFTING (CABG) X 5 (LIMA to  LAD, LEFT RADIAL ARTERY to DIAGONAL, SVG to SEQUENTIALLY PDA and PLB) , USING LEFT INTERNAL MAMMARY ARTERY, LEFT RADIAL ARTERY, AND  SAPHENOUS VEIN to CIRCUMFLEX, RIGHT GREATER SAPHENOUS VEIN HARVESTED ENDOSCOPICALLY;  Surgeon: Delight Ovens, MD;  Location: [facility] OR;  Service: Open Heart Surgery;  Laterality: N/A;   CORONARY/GRAFT ACUTE MI REVASCULARIZATION N/A 07/24/2017   Procedure: Coronary/Graft Acute MI Revascularization;  Surgeon: Lyn Records, MD;  Location: [facility] INVASIVE CV LAB;  Service: Cardiovascular;  Laterality: N/A;   LAPAROSCOPIC GASTRIC BANDING  09/02/2008   LEFT HEART CATH AND CORONARY ANGIOGRAPHY N/A 07/24/2017   Procedure: LEFT HEART CATH AND CORONARY ANGIOGRAPHY;  Surgeon: Lyn Records, MD;  Location: [facility] INVASIVE CV LAB;  Service: Cardiovascular;  Laterality: N/A;   RADIAL ARTERY HARVEST Left 08/02/2017   Procedure: LEFT RADIAL ARTERY HARVEST;  Surgeon: Delight Ovens, MD;  Location: [facility] OR;  Service: Open Heart Surgery;  Laterality: Left;   STERNAL CLOSURE N/A 08/02/2017   Procedure: STERNAL PLATING;  Surgeon: Delight Ovens, MD;  Location: [facility] OR;  Service: Open Heart Surgery;  Laterality: N/A;   STOMACH SURGERY  1982   had metal removed from stomach as a child   TEE WITHOUT CARDIOVERSION N/A 08/02/2017   Procedure: TRANSESOPHAGEAL ECHOCARDIOGRAM (TEE);  Surgeon: Delight Ovens, MD;  Location: [facility] OR;  Service: Open Heart Surgery;  Laterality: N/A;      OB History     Gravida  2   Para  0   Term  0   Preterm  0   AB  1   Living  0      SAB  1   IAB  0   Ectopic  0   Multiple  0   Live Births  0               Current Medications: Current Meds  Medication Sig   acetaminophen (TYLENOL) 500 MG tablet Take 500 mg by mouth every 6 (six) hours as needed for mild pain (pain score 1-3) or headache.   aspirin EC 81 MG tablet Take 2 tablets (162 mg total) by mouth daily.   BD PEN NEEDLE NANO 2ND GEN 32G X 4 MM MISC See admin instructions.    cholecalciferol (VITAMIN D3) 25 MCG (1000 UNIT) tablet Take 1,000 Units by mouth daily.   cyclobenzaprine (FLEXERIL) 10 MG tablet Take 1 tablet (10 mg total) by mouth 3 (three) times daily.   insulin aspart (NOVOLOG FLEXPEN) 100 UNIT/ML FlexPen Take 3-5 units before each meal depending on the sugar range   LANTUS SOLOSTAR 100 UNIT/ML Solostar Pen Inject into the skin. PATIENT USES 12 UNITS IN THE MORNING AND 12 UNITS AT NIGHT.   polycarbophil (FIBERCON) 625 MG tablet Take 625 mg by mouth daily.   Prenatal 28-0.8 MG TABS Take 1 tablet by mouth daily.   Probiotic Product (PROBIOTIC DAILY PO) Take by mouth.   vitamin B-12 (CYANOCOBALAMIN) 100 MCG tablet Take 100 mcg by mouth daily.     Allergies:   Patient has no known allergies.   Social History   Socioeconomic History   Marital status: Married    Spouse name: Danyale Ridinger   Number of children: Not on file   Years of education: Not on file   Highest education level: Not on file  Occupational History   Occupation: Retail banker: [employer]  Tobacco Use   Smoking status: Never   Smokeless tobacco: Never  Vaping Use   Vaping status: Never Used  Substance and Sexual Activity   Alcohol use: Not Currently    Comment: occ   Drug use: No   Sexual activity: Not Currently  Other Topics Concern   Not on file  Social History Narrative   Not on file   Social Drivers of Health   Financial Resource Strain: Not on file  Food Insecurity: No Food Insecurity (08/17/2023)   Hunger Vital Sign    Worried About Running Out of Food in the Last Year: Never true    Ran Out of Food in the Last Year: Never true  Transportation Needs: No Transportation Needs (08/17/2023)   PRAPARE - Administrator, Civil Service (Medical): No    Lack of Transportation (Non-Medical): No  Physical Activity: Not on file  Stress: Not on file  Social Connections: Not on file      Family History  Problem Relation Age  of Onset   Hyperlipidemia Mother    Hypertension Mother    Obesity Mother    Diabetes Father    Hypertension Father    Heart disease Father    Sudden death Father 27       died of MI   Cancer Maternal Grandmother  lung   Cancer Maternal Grandfather        prostate      ROS:   Please see the history of present illness.     All other systems reviewed and are negative.   Labs/EKG Reviewed:    EKG:   EKG was not ordered today.    Recent Labs: 08/22/2023: ALT 6; BUN 11; Creatinine, Ser 0.79; Hemoglobin 14.0; Platelets 222; Potassium 4.2; Sodium 134; TSH 0.070   Recent Lipid Panel Lab Results  Component Value Date/Time   CHOL 141 05/20/2023 10:03 AM   TRIG 62 05/20/2023 10:03 AM   HDL 49 05/20/2023 10:03 AM   CHOLHDL 2.9 05/20/2023 10:03 AM   CHOLHDL 3.6 07/26/2017 02:50 AM   LDLCALC 79 05/20/2023 10:03 AM    Physical Exam:    VS:  BP 108/66 (BP Location: Right Arm, Patient Position: Sitting, Cuff Size: Large)   Pulse 74   Ht 5\' 4"  (1.626 m)   Wt 259 lb 3.2 oz (117.6 kg)   LMP 05/27/2023 (Exact Date)   SpO2 98%   BMI 44.49 kg/m     Wt Readings from Last 3 Encounters:  11/22/23 259 lb 3.2 oz (117.6 kg)  11/17/23 257 lb (116.6 kg)  10/26/23 254 lb 6.4 oz (115.4 kg)     GEN:  Well nourished, well developed in no acute distress HEENT: Normal NECK: No JVD; No carotid bruits LYMPHATICS: No lymphadenopathy CARDIAC: RRR, no murmurs, rubs, gallops RESPIRATORY:  Clear to auscultation without rales, wheezing or rhonchi  ABDOMEN: Soft, non-tender, non-distended MUSCULOSKELETAL:  No edema; No deformity  SKIN: Warm and dry NEUROLOGIC:  Alert and oriented x 3 PSYCHIATRIC:  Normal affect    Risk Assessment/Risk Calculators:                 ASSESSMENT & PLAN:    Pregnancy in the setting of CAD No chest pain suspected for anginal. Will continue to monitor her closely. She has been off atorvastatin, we discussed at her last visit risk and benefit of  keeping statin on given high risk ASCVD but patient declined.  Patient prefers to discontinue statin therapy during pregnancy. --Consider restarting statin therapy postpartum depending on breastfeeding status. Will closely monitor if needed will increase metoprolol if suspect angina. Continue Aspirin  for preeclampsia prophylaxis  CARPEG II Score = 8 points, 41 % primary cardiac event risk. WHO Class II-III, high risk pregnancy requiring frequent cardiology follow up.  She is following with [facility] OB specialty clinics  Prenatal care Continue with prenatal vitamins.  Hypertensive disorder in pregnancy is with chronic hypertension affecting pregnancy. -Monitor blood pressure daily at home. -Report any blood pressure readings consistently over 130 for three days or any reading over 140 immediately. -Refer to pharmacist for management of diabetes during pregnancy.   -Plan for more frequent visits as pregnancy progresses (every 4 weeks initially, then every 2 weeks closer to term).   Patient Instructions  Medication Instructions:  No Changes *If you need a refill on your cardiac medications before your next appointment, please call your pharmacy*   Lab Work: None If you have labs (blood work) drawn today and your tests are completely normal, you will receive your results only by: MyChart Message (if you have MyChart) OR A paper copy in the mail If you have any lab test that is abnormal or we need to change your treatment, we will call you to review the results.   Testing/Procedures: None   Follow-Up: At [facility],  you and your health needs are our priority.  As part of our continuing mission to provide you with exceptional heart care, we have created designated Provider Care Teams.  These Care Teams include your primary Cardiologist (physician) and Advanced Practice Providers (APPs -  Physician Assistants and Nurse Practitioners) who all work together to provide you with  the care you need, when you need it.  Your next appointment:   5 week(s) Okay to [facility] per Dr. Servando Salina  Provider:   Thomasene Ripple, DO            Dispo:  No follow-ups on file.   Medication Adjustments/Labs and Tests Ordered: Current medicines are reviewed at length with the patient today.  Concerns regarding medicines are outlined above.  Tests Ordered: No orders of the defined types were placed in this encounter.  Medication Changes: No orders of the defined types were placed in this encounter.

## 2023-12-05 ENCOUNTER — Ambulatory Visit: Payer: 59 | Admitting: *Deleted

## 2023-12-05 ENCOUNTER — Encounter: Payer: Self-pay | Admitting: *Deleted

## 2023-12-05 ENCOUNTER — Ambulatory Visit: Payer: 59 | Attending: Maternal & Fetal Medicine

## 2023-12-05 ENCOUNTER — Other Ambulatory Visit: Payer: Self-pay | Admitting: *Deleted

## 2023-12-05 VITALS — BP 127/65 | HR 68

## 2023-12-05 DIAGNOSIS — O0990 Supervision of high risk pregnancy, unspecified, unspecified trimester: Secondary | ICD-10-CM | POA: Insufficient documentation

## 2023-12-05 DIAGNOSIS — O10919 Unspecified pre-existing hypertension complicating pregnancy, unspecified trimester: Secondary | ICD-10-CM | POA: Insufficient documentation

## 2023-12-05 DIAGNOSIS — O24319 Unspecified pre-existing diabetes mellitus in pregnancy, unspecified trimester: Secondary | ICD-10-CM | POA: Diagnosis present

## 2023-12-05 DIAGNOSIS — O09522 Supervision of elderly multigravida, second trimester: Secondary | ICD-10-CM | POA: Diagnosis present

## 2023-12-05 DIAGNOSIS — O99212 Obesity complicating pregnancy, second trimester: Secondary | ICD-10-CM

## 2023-12-05 DIAGNOSIS — O99210 Obesity complicating pregnancy, unspecified trimester: Secondary | ICD-10-CM | POA: Diagnosis present

## 2023-12-05 DIAGNOSIS — O10012 Pre-existing essential hypertension complicating pregnancy, second trimester: Secondary | ICD-10-CM

## 2023-12-05 DIAGNOSIS — O24119 Pre-existing diabetes mellitus, type 2, in pregnancy, unspecified trimester: Secondary | ICD-10-CM

## 2023-12-05 DIAGNOSIS — E669 Obesity, unspecified: Secondary | ICD-10-CM | POA: Diagnosis not present

## 2023-12-05 DIAGNOSIS — Z3A26 26 weeks gestation of pregnancy: Secondary | ICD-10-CM

## 2023-12-08 ENCOUNTER — Ambulatory Visit: Payer: 59 | Admitting: Obstetrics & Gynecology

## 2023-12-08 ENCOUNTER — Encounter: Payer: Self-pay | Admitting: Obstetrics & Gynecology

## 2023-12-08 VITALS — BP 134/82 | HR 72 | Wt 261.0 lb

## 2023-12-08 DIAGNOSIS — O24319 Unspecified pre-existing diabetes mellitus in pregnancy, unspecified trimester: Secondary | ICD-10-CM | POA: Diagnosis not present

## 2023-12-08 DIAGNOSIS — O10919 Unspecified pre-existing hypertension complicating pregnancy, unspecified trimester: Secondary | ICD-10-CM | POA: Diagnosis not present

## 2023-12-08 DIAGNOSIS — I252 Old myocardial infarction: Secondary | ICD-10-CM | POA: Diagnosis not present

## 2023-12-08 DIAGNOSIS — O09522 Supervision of elderly multigravida, second trimester: Secondary | ICD-10-CM

## 2023-12-08 DIAGNOSIS — O0990 Supervision of high risk pregnancy, unspecified, unspecified trimester: Secondary | ICD-10-CM

## 2023-12-08 DIAGNOSIS — Z3A26 26 weeks gestation of pregnancy: Secondary | ICD-10-CM

## 2023-12-08 NOTE — Progress Notes (Unsigned)
 PRENATAL VISIT NOTE  Subjective:  [name] is a 43 y.o. G2P0010 at [redacted]w[redacted]d being seen today for ongoing prenatal care.  She is currently monitored for the following issues for this {Blank single:19197::"high-risk","low-risk"} pregnancy and has History of ST elevation myocardial infarction; Type 2 diabetes mellitus with obesity (HCC); Essential hypertension; History of gastric surgery; Supervision of high risk pregnancy, antepartum; Preexisting diabetes complicating pregnancy, antepartum; Preexisting hypertension complicating pregnancy, antepartum; AMA (advanced maternal age) 85+; Anxiety; Sickle cell trait (HCC); Maternal obesity affecting pregnancy, antepartum; Cardiac complication; and BMI 40.0-44.9, adult (HCC) on their problem list.  Patient reports {sx:14538}.  Contractions: Not present. Vag. Bleeding: None.  Movement: Present. Denies leaking of fluid.   The following portions of the patient's history were reviewed and updated as appropriate: allergies, current medications, past family history, past medical history, past social history, past surgical history and problem list.   Objective:   Vitals:   12/08/23 1616  BP: 134/82  Pulse: 72  Weight: 261 lb (118.4 kg)    Fetal Status: Fetal Heart Rate (bpm): 154   Movement: Present     General:  Alert, oriented and cooperative. Patient is in no acute distress.  Skin: Skin is warm and dry. No rash noted.   Cardiovascular: Normal heart rate noted  Respiratory: Normal respiratory effort, no problems with respiration noted  Abdomen: Soft, gravid, appropriate for gestational age.  Pain/Pressure: Absent     Pelvic: {Blank single:19197::"Cervical exam performed in the presence of a chaperone","Cervical exam deferred"}        Extremities: Normal range of motion.  Edema: None  Mental Status: Normal mood and affect. Normal behavior. Normal judgment and thought content.   Assessment and Plan:  Pregnancy: G2P0010 at [redacted]w[redacted]d 1.  Preexisting diabetes complicating pregnancy, antepartum (Primary) ***  2. Preexisting hypertension complicating pregnancy, antepartum ***  3. History of ST elevation myocardial infarction ***  4. Multigravida of advanced maternal age in second trimester ***  5. [redacted] weeks gestation of pregnancy ***  6. Supervision of high risk pregnancy, antepartum ***  {Blank single:19197::"Term","Preterm"} labor symptoms and general obstetric precautions including but not limited to vaginal bleeding, contractions, leaking of fluid and fetal movement were reviewed in detail with the patient. Please refer to After Visit Summary for other counseling recommendations.   Return in about 2 weeks (around 12/22/2023) for OFFICE OB VISIT (MD only).  Future Appointments  Date Time Provider Department Center  12/22/2023  3:50 PM Tereso Newcomer, MD [facility]  01/02/2024  9:15 AM [facility] NURSE [facility]  01/02/2024  9:30 AM [facility] US1 [facility]  01/04/2024  9:35 AM Reva Bores, MD [facility]  01/06/2024  1:20 PM Tobb, Lavona Mound, DO [facility] None  01/06/2024  1:40 PM Cheree Ditto, RPH [facility] None  01/16/2024  8:30 AM [facility] NURSE [facility]  01/16/2024  8:45 AM [facility] NST [facility]  01/16/2024  9:30 AM [facility] US3 [facility]  01/18/2024  3:50 PM Reva Bores, MD [facility]  01/19/2024  8:30 AM [facility] NURSE [facility]  01/19/2024  8:45 AM [facility] NST [facility]  01/23/2024  8:30 AM [facility] NURSE [facility]  01/23/2024  8:45 AM [facility] NST [facility]  01/26/2024  8:30 AM [facility] NURSE [facility]  01/26/2024  8:45 AM [facility] NST [facility]  01/30/2024  8:15 AM [facility] NURSE [facility]  01/30/2024  8:30 AM [facility] US2 [facility]  02/01/2024  2:30 PM Mutasim Tuckey, Jethro Bastos, MD [facility]  02/15/2024  4:10 PM Reva Bores, MD [facility]    Jaynie Collins, MD

## 2023-12-08 NOTE — Progress Notes (Unsigned)
 ROB  Pt has blood sugar log on phone/paper copy.   CC: HA's early in the morning will last 1-2 days. No visual changes , no swelling , no upper abdominal pain.   Pt consents to female student in exam.

## 2023-12-08 NOTE — Patient Instructions (Signed)

## 2023-12-16 ENCOUNTER — Encounter: Payer: 59 | Admitting: Family Medicine

## 2023-12-16 ENCOUNTER — Other Ambulatory Visit: Payer: 59

## 2023-12-22 ENCOUNTER — Ambulatory Visit: Payer: 59 | Admitting: Obstetrics & Gynecology

## 2023-12-22 VITALS — BP 128/83 | HR 73 | Wt 265.0 lb

## 2023-12-22 DIAGNOSIS — I252 Old myocardial infarction: Secondary | ICD-10-CM

## 2023-12-22 DIAGNOSIS — Z3A28 28 weeks gestation of pregnancy: Secondary | ICD-10-CM

## 2023-12-22 DIAGNOSIS — O09523 Supervision of elderly multigravida, third trimester: Secondary | ICD-10-CM

## 2023-12-22 DIAGNOSIS — O10919 Unspecified pre-existing hypertension complicating pregnancy, unspecified trimester: Secondary | ICD-10-CM

## 2023-12-22 DIAGNOSIS — O0990 Supervision of high risk pregnancy, unspecified, unspecified trimester: Secondary | ICD-10-CM

## 2023-12-22 DIAGNOSIS — O24319 Unspecified pre-existing diabetes mellitus in pregnancy, unspecified trimester: Secondary | ICD-10-CM

## 2023-12-22 NOTE — Patient Instructions (Signed)
Return to office for any scheduled appointments. Call the office or go to the MAU at [place] [facility] if: You begin to have strong, frequent contractions Your water breaks.  Sometimes it is a big gush of fluid, sometimes it is just a trickle that keeps getting your underwear wet or running down your legs You have vaginal bleeding.  It is normal to have a small amount of spotting if your cervix was checked.  You do not feel your baby moving like normal.  If you do not, get something to eat and drink and lay down and focus on feeling your baby move.   If your baby is still not moving like normal, you should call the office or go to MAU. Any other obstetric concerns.   TDaP Vaccine Pregnancy Get the Whooping Cough Vaccine While You Are Pregnant (CDC)  It is important for women to get the whooping cough vaccine in the third trimester of each pregnancy. Vaccines are the best way to prevent this disease. There are 2 different whooping cough vaccines. Both vaccines combine protection against whooping cough, tetanus and diphtheria, but they are for different age groups: Tdap: for everyone 11 years or older, including pregnant women  DTaP: for children 2 months through 83 years of age  You need the whooping cough vaccine during each of your pregnancies The recommended time to get the shot is during your 27th through 36th week of pregnancy, preferably during the earlier part of this time period. The Centers for Disease Control and Prevention (CDC) recommends that pregnant women receive the whooping cough vaccine for adolescents and adults (called Tdap vaccine) during the third trimester of each pregnancy. The recommended time to get the shot is during your 27th through 36th week of pregnancy, preferably during the earlier part of this time period. This replaces the original recommendation that pregnant women get the vaccine only if they had not previously received it. The [employer] of Obstetricians and Gynecologists and the [employer] support this recommendation.  You should get the whooping cough vaccine while pregnant to pass protection to your baby frame support disabled and/or not supported in this browser  Learn why [name] decided to get the whooping cough vaccine in her 3rd trimester of pregnancy and how her baby girl was born with some protection against the disease. Also available on YouTube. After receiving the whooping cough vaccine, your body will create protective antibodies (proteins produced by the body to fight off diseases) and pass some of them to your baby before birth. These antibodies provide your baby some short-term protection against whooping cough in early life. These antibodies can also protect your baby from some of the more serious complications that come along with whooping cough. Your protective antibodies are at their highest about 2 weeks after getting the vaccine, but it takes time to pass them to your baby. So the preferred time to get the whooping cough vaccine is early in your third trimester. The amount of whooping cough antibodies in your body decreases over time. That is why CDC recommends you get a whooping cough vaccine during each pregnancy. Doing so allows each of your babies to get the greatest number of protective antibodies from you. This means each of your babies will get the best protection possible against this disease.  Getting the whooping cough vaccine while pregnant is better than getting the vaccine after you give birth Whooping cough vaccination during pregnancy is ideal so  your baby will have short-term protection as soon as he is born. This early protection is important because your baby will not start getting his whooping cough vaccines until he is 2 months old. These first few months of life are when your baby is at greatest risk for catching whooping cough. This is also when he's at greatest  risk for having severe, potentially life-threating complications from the infection. To avoid that gap in protection, it is best to get a whooping cough vaccine during pregnancy. You will then pass protection to your baby before he is born. To continue protecting your baby, he should get whooping cough vaccines starting at 2 months old. You may never have gotten the Tdap vaccine before and did not get it during this pregnancy. If so, you should make sure to get the vaccine immediately after you give birth, before leaving the hospital or birthing center. It will take about 2 weeks before your body develops protection (antibodies) in response to the vaccine. Once you have protection from the vaccine, you are less likely to give whooping cough to your newborn while caring for him. But remember, your baby will still be at risk for catching whooping cough from others. A recent study looked to see how effective Tdap was at preventing whooping cough in babies whose mothers got the vaccine while pregnant or in the hospital after giving birth. The study found that getting Tdap between 27 through 36 weeks of pregnancy is 85% more effective at preventing whooping cough in babies younger than 2 months old. Blood tests cannot tell if you need a whooping cough vaccine There are no blood tests that can tell you if you have enough antibodies in your body to protect yourself or your baby against whooping cough. Even if you have been sick with whooping cough in the past or previously received the vaccine, you still should get the vaccine during each pregnancy. Breastfeeding may pass some protective antibodies onto your baby By breastfeeding, you may pass some antibodies you have made in response to the vaccine to your baby. When you get a whooping cough vaccine during your pregnancy, you will have antibodies in your breast milk that you can share with your baby as soon as your milk comes in. However, your baby will not get  protective antibodies immediately if you wait to get the whooping cough vaccine until after delivering your baby. This is because it takes about 2 weeks for your body to create antibodies. Learn more about the health benefits of breastfeeding.  [place] [phone]) [facility] Owens Shark, MD; Erin Hearing, MD; Gwendlyn Deutscher, MD; Andria Frames, MD; McDiarmid, MD; Dutch Quint, [address] [place], [place] [address] [phone] Mon-Fri 8:30-12:30, 1:30-5:00  Providers come to see babies during newborn hospitalization Only accepting infants of Mother's who are seen at [facility] or have siblings seen at   [place] Medicaid - Yes; [place], MD [address], [place], [place] [address] [phone] Mon, Tue, Thur, Fri 8:30-5:00, Wed 10:00-7:00 (closed 1-2pm daily for lunch) [facility] residents with no insurance.  [place] only with Medicaid/insurance; Tricare - no  [facility]) - Tim and Sanford [place], MD; Owens Shark, MD; Tamera Punt, MD; Doneen Poisson, MD; Fatima Sanger, MD; Lindwood Qua, MD; Thornell Sartorius, MD; Ronnald Ramp,  MD; Wynetta Emery, MD; Jess Barters, MD; Tami Ribas, MD; Derrell Lolling, MD; Dorothyann Peng, MD; Lucious Groves, NP [address], [place], [place] [address] [phone] Mon, Tue, Thur, Fri 8:30-5:30, Wed 9:30-5:30, Sat 8:30-12:30 Only accepting infants of first-time parents or siblings of current patients Hospital discharge coordinator will make follow-up appointment Medicaid - yes; Tricare - yes  [place] [phone]) [facility] Garnette Czech, MD; Rosana Hoes, MD; Servando Salina, MD; Rose Fillers, MD; Corinna Capra, MD; [facility], MD; Javier Glazier, MD; Janann Colonel, MD; Jimmye Norman, [place], [place], [place] [address] [phone] Mon-Fri 8:30-5:00, closed for lunch 12:30-1:30; Sat-Sun 10:00-1:00 Accepting Newborns with commercial  insurance only, must call prior to delivery to be accepted into  practice.  Medicaid - no, Tricare - yes   [place], [place] [address] [phone] or [phone] Mon to Fri 8am to 10pm, Sat 8am to 1pm (virtual only on weekends) Only accepts Medicaid Healthy Blue pts  [facility] ([facility]) - Pediatrics at Rogelia Boga, MD; Vilma Prader, MD; Vanita Panda, MD; Roxanne Mins, NP; Nestor Lewandowsky, MD; Ronney Lion, MD [address] [place], [place] [address] [phone] Mon-Fri 8:30-5:30 Medicaid - yes, Tricare - yes  [place] [phone]) [facility] Burnadette Pop, MD [address], [place], [place] [address] [phone] Sammuel Cooper, Wed Fri 8:30-5:00, Sat 8:30-12:00, Closed Thursdays Accepting siblings of established patients and first time mom's if you call prenatally Medicaid- yes; Tricare - yes  [place] at Arlina Robes, [place]; Mannie Stabile, MD; Jerald Kief; [place]Rober[place], [place]; Nancy Fetter, MD; Moreen Fowler, MD;  [address] [place], [place] [address] [phone] Mon-Fri 8:30-5:00, closed for lunch 1-2 Only accepting newborns of established patients Medicaid- no; Tricare - yes  [facility] [phone]) [place] at Leane Para, MD; [place], [place], [place] [address] [phone] Mon-Fri 8:00-5:00 Medicaid - No; Tricare - Yes  [place] at [facility], [place]; [place], [place], [place], [place] [address] [phone] Mon-Fri 8:00-5:00 Medicaid - No, Tricare - Yes  [facility] Abner Greenspan, MD; Sheran Lawless, MD; [place], [place] [address], [address] [place], [place] [address] [phone]  Mon-Fri 8:00-5:00 Medicaid - No; Tricare - Yes  [address] [place], [place] [address] [phone] Mon-Fri 8:30-5:00 (lunch 12:00-1:00) Medicaid -Yes; [place] at  [place], MD [place], [place], [place] [address] [phone] Mon-Fri 8:00-5:00 Seeing newborns of current patients only. No new patients Medicaid - No, Tricare - yes  Therapist, music at [place], [address] [place], [place] [address] [phone] Mon-Fri 8:00-5:00 Medicaid -yes as secondary coverage only; Tricare - yes  [place], [place]; [place], [place]; Albertina Parr, MD; Frederic Jer[place]o, MD; Ronney Lion, MD; [place], [place]; [place], NP; Corinna Lines, MD; [place], MD [address] [place], [place] [address] [phone] Mon-Fri 8:30-5:00, Sat 9:00-11:00 Accepts commercial insurance ONLY. Offers free prenatal information sessions for families. Medicaid - No, Tricare - Call first  [place], MD; [place], [place]; [place], [place]; [place], [address]., [place] [address] [phone] Mon-Fri 7:30-5:30 Medicaid - Yes; Marchelle Gearing yes  [place] [phone] & [phone])  [facility], [address] [place], [place] [address] [phone] Mon-Thur 8:00-6:00, closed for lunch 12-2, closed Fridays Medicaid - yes; Tricare - no  [place], NP; Melford Aase, MD; [place], [place]; [place], [place] [address] [place], [place], [place] [address] [phone] Mon-Fri 7:30-4:30 Medicaid - yes, Tricare - yes  [facility]  Carolynn Sayers, MD; Cristino Martes, NP; Gertie Baron, MD; Debara Pickett, [place]  [address], [name], [place] [address] [phone] Mon-Fri 8:30-5:00, closed for lunch 1-2, Sat 8:30-12:00 - sick visits only Providers come to see babies at [facility] Only accepting newborns of siblings and first time parents ONLY if who have met with office prior to delivery Medicaid -Yes; Tricare - yes  [place], [place]; Fredderick Severance, NP; Juleen China, MD; Clydene Laming, MD:  [place] [address], [place], [place]  [address] [phone] Mon- Fri 8:00-5:00, Sat 9:00-12:00 - sick visits only Accepting siblings of established patients and first time mom/baby Medicaid - Yes; Tricare - yes Patients must have vaccinations (baby vaccines)  [place] [phone] & [phone])  [place] at [address] [place], [place] [address] [phone] Mon-Fri 8:00-5:00 Medicaid - no; Tricare - yes  [place], MD; [place], [place]; [place], Ch[address], [place], [place] [address] [phone] Mon-Fri 8:00-5:00 Medicaid- yes; Tricare - yes  [place], MD; Ronnald Ramp, NP; [place], [place] [address] [place], [place], [place] [address] [phone] Mon-Fri 8:00-5:00 Medicaid - Yes; Tricare - yes  [address] [place] [phone])  [place], [place]; [place], [place]; Maisie Fus, MD; Charlesetta Garibaldi, MD; [place], NP; Isenhour, DO; [place], [place]; Jeannine Kitten, MD; Sheila Oats, MD; Hardin Negus, MD; [place], [place]; [place], [place]; [place], [place] [address] [place], [place], [place] [address] [phone] Mon-Fri 8:30-5:00, Sat 9:00-12:00 - sick only Please register online [email or web address] then schedule online or call office Medicaid-Yes; Tricare -yes  [facility] - Premier  Dabrusco, MD; Delora Fuel, MD; [place], MD; [place], NP; [place], [place]; Everette Rank, MD; Radford Pax, NP; Melina Modena, MD [address] [place], [place], [place] [address] [phone] Mon-Fri 8:00-5:30, Sat&Sun by appointment (phones open at 8:30) Medicaid - Yes; Tricare - yes  [place] [phone] & [phone]) [place], CPNP; [place], MD; Araceli Bouche, MD; Jerelene Redden, NP; [place], DO [address], [place], [place] [address] [phone] M-F 8:00 - 5:15, Sat/Sun 9-12 sick visits only Medicaid - No; Tricare - yes  [place],  PA-C; Scho[place], PA-C; [place], DO; [place], PA-C; [place], PA-C; Vassie Moselle, [address] [place], [place] [address] [phone] Mon-Thur 8:00-7:00, Fri 8:00-5:00 Accepting Medicaid for 13 and under only   [facility] at [facility]) [place], [place]; List, FNP; Selinda Eon, MD; Christie Nottingham, PA-C; Hubbard Hartshorn, MD; Modena Nunnery, FNP; Everlean Patterson, FNP; Tempie Donning, MD; Selinda Eon, [place] [address] [place], [place] [address] [phone] Mon-Fri 8:30-5:30 Medicaid - Yes; Tricare - yes  [place], [place]; Rolla Plate, MD; Carola Rhine, MD; Tyron Russell, MD; [place], NP [address], [address], [place], [place] [address] [phone] Mon-Thur 8:00-5:30, Fri 8:00-5:00, Sat 9:00-12:00 Medicaid - yes, Tricare - yes  [place] [phone])  [place] at [facility] [place], [place]; Olen Pel, MD; [place], [place], [place], [place] [address] [phone] Mon-Fri 8:00-5:00, closed for lunch 12-1 Medicaid - No; [place] - yes  Therapist, music at [facility], [place], [place], [place] [address] [phone] Mon-Fri 8:00-5:00 Medicaid - No; Tricare - yes  [facility], MD; Joelene Millin, MD; Teryl Lucy, MD; Ronnald Ramp, MD [address], [place], [place] [address] [phone] Mon-Fri 8:00-5:00 Medicaid- Yes;  Tricare - yes  [place] [phone])  [place] at [facility], [place]; [place], MD [address] [name] [address] [place], [place], [place] [address] [phone] Mon-Fri 8:00-5:00 Medicaid - No; [place] - yes  [place] [name] [place], [place], [place] [address] [phone] Mon-Weds 8:00-6:00, Thurs-Fri 8:00-5:00, Sat 9:00-12:00 Medicaid - yes; Tricare - yes   [place], MD; [place], [place], [place] [address] [phone] Mon-Fri  8:00-5:00 Medicaid - yes; [place] - yes  [facility] Pediatric Providers  [facility] [address] [place], [place] [address] [phone] Gentry Roch: 8am -8pm, Tues, Weds: 8am - 5pm; Fri: 8-1 Medicaid - Yes; Tricare - yes  [facility] Ilda Mori, MD; Wynetta Emery, MD; Rock Nephew, MD; [place], [place]; [place], [place] [address], [place], [place] [address] [phone] M-F 8:30 - 5:00 Medicaid - Call office; Tricare -yes  [facility] Fredderick Erb, MD; Wynelle Cleveland, MD, Cherylann Banas, MD; Koren Bound, PNP; Terrial Rhodes, [address] [place], [place] [address] [phone] M-F 8:30 - 5:00, Sat/Sun 8:30 - 12:30 (sick visits) Medicaid - Call office; Tricare -yes  [facility] Bobby Rumpf, MD; Wynetta Emery, PNP; Jaynie Crumble, MD; [place], [place]; Rembert, NP; Orson Aloe [address] [place], [place], [place] [address] [phone] M-F 8:30 - 5:00 Medicaid - Call office; Tricare - yes  [facility] Collene Leyden, MD; Marney Doctor, MD; Melburn Hake, MD; Franki Cabot, MD; Nogo, MD [phone] [address] [place], [place] [address] [phone] M-Thur: 8:00 - 5:00; Fri: 8:00 - 4:00 Medicaid - yes; Tricare - yes  [facility] [address] [place], [place] [address] [phone] M-F: 8:30- 5:00, closed for lunch 12:30 - 1:00 Medicaid - yes; Tricare -yes  [place] [address] [place], [place] [id number] [phone] M-F 8:00 - 5:00 Medicaid - yes; Tricare - yes  [place], DO; [place], DO; [place], [place] [address] [place], [place] [address] [phone] M-F 8:00 - 5:00, Closed 12-1 for lunch Medicaid - Call; [place] [facility] - yes  [place], MD [address], [place], [place] [address] [phone] M-F: 8:00-5:00, Sat: 8:00 - noon Medicaid - call; [place] -yes  [facility] [place], [place] [name] [place],  [place], [place] [address] [phone] M-W: 8:00-5:00, Thur: 8:00 - 7:00, Fri: 8:00 - noon Medicaid - yes; Tricare - yes  [place], [place], [place], [place] [address] [phone] M-F 8:00 - 5:00, Closed for lunch 12-1 Medicaid - yes; [place] - yes  [facility] Pediatric Providers  [facility] Primary Care at [place], [place], Trilby Drummer, MD, [place], [place], [facility], Carm[place], [place], [place] [address] [phone] M-T 8:00-5:00, Wed-Fri 7:00-6:00 Medicaid - Yes; Tricare -yes  [place] at [facility], DO; [address] [place], [place], [place] [address] [phone] M-F 8:00 - 5:00, closed for lunch 12-1 Medicaid - Yes; Tricare - yes  [facility]  Drema Dallas, MD; Marilynn Rail, MD; Kerby Less, MD; Margaretmary Eddy, MD; Damita Dunnings, MD; Deidre Ala, MD; Arrie Eastern, MD, Leward Quan, MD; Sherren Mocha, MD; Emmit Pomfret, MD; Morton Stall, MD; Clydene Laming, MD [address] [place], [place] [address] [phone] M-F 8:00-5:00 Medicaid - yes; Tricare - yes  [facility]  [facility], MD (speaks [place] and Hindi) [address] [place], [place] [address] [phone] M-F: 8:30 - 5:00, closed 12:30 -  1 for lunch Medicaid - Yes; Tricare -yes  [facility] Pediatric Providers  [facility] Delice Lesch, MD; Lindell Noe, MD; Lavone Neri, [place], [place], [place] [address] [phone] M-Th: 8:00 - 5:30, Fri: 8:00 - 12:00 Medicaid - yes; Tricare - yes  [facility], NP; Verlee Monte, MD; Nadeen Landau, MD [place] [address], [place], [place] [address] [phone] M-F: 8:00 - 5:00 Medicaid - yes; Tricare - yes  [place], NP; Deon Pilling, NP; Angelica Pou, NP; Manya Silvas, MD; Jimmye Norman, MD, [place], NP, Glo Herring, MD; Cathleen Fears [address] [place], [place] [address] [phone] M-F: 8:30 - 5:30p Medicaid - yes;  Tricare - yes Other locations available as well  [facility]), MD; Redmond Pulling, MD; Marcine Matar, [place], [place], [place] [address] [phone] M-W: 8:00am - 7:00pm, Thurs: 8:00am - 8:00pm; Fri: 8:00am - 5:00pm, closed daily from 12-1 for lunch Medicaid - yes; [place] - yes  [facility] Pediatric Providers  [facility] at Robb Matar, MD; Donella Stade, [place]; Carrolyn Meiers, MD; Johnnette Litter, MD; [place], [place]; Leonie Man; [place], [place]; Alcario Drought, MD;  [address], [place], [place] [address] [phone] Jerilynn Mages - Ludwig Clarks: [place], Sat 9-noon Medicaid - Yes; Tricare -yes  [facility] at Smitty Knudsen, MD; Ronnald Ramp, FNP; Sherryle Lis, MD; Teryl Lucy, [place]. Sharyl Nimrod, [phone] M-F 8:00 - 5:00 Medicaid - call; Tricare - yes  [facility]- Lollie Sails, MD; [place], [place]; Lawana Chambers, MD; Ouida Sills, MD; Red Christians, MD; Marzetta Board, MD; Emiliano Dyer; Randolm Idol, MD; Derald Macleod, MD; [place], [place], [place], [place] [address] [phone] M-F 8:00am - 5:00pm; Sat. 9:00 - 11:00 Medicaid - yes; Tricare - yes  [facility] at [facility], MD [address] [place], [place] [address] [phone] M-F 8:00 - 5:00 Medicaid - [name] Medicaid only; Tricare - yes  [facility] - Signa Kell, MD; Rosana Hoes, [place]; Gordan Payment, [place], [place], [place] [address] [phone] M-F 8:00 - 5:00 Medicaid - yes; Tricare - yes  [facility] [address] Pediatrics - Francine Graven, MD; Owens Shark, MD, [facility], MD, [place], MD; Arabi, MD; Tamala Julian, [place]; [address] Nyoka Lint [place], [place] [address] [phone] M-F: 8-5 Medicaid - yes; [place] - yes  [place], [place]; [place], MD; [address], [place], [place] [address] [phone] M-F 8-5 Medicaid - yes; Tricare - yes  [address] [place] Joycelyn Rua, MD; Joelene Millin, MD; [place], MD; [place], DNP; [place], [place] [address], [place], [place] [address] [phone] M-F 8-5 Medicaid - yes; Tricare - yes  [facility] Danelle Earthly, MD; Vinson Moselle; Lucie Leather, MD [address], [place], [place] [phone] [phone] M-F 7am - 5 pm Medicaid - call; Tricare - yes  [place], [place]; Louanne Skye, MD; Quentin Cornwall, [place], [place], [place] [address] [phone] M-F 8-5 Medicaid - yes; Tricare - yes  [facility] Pascal Lux, MD; Suzan Slick, MD; Jimmye Norman, [place]; Rolena Infante, [place]; Thornton Papas, [place]; Robinette Haines; [facility] - FNP [address], [place],  [address] [phone] M-F 8-5 Medicaid- yes; Tricare - yes  [facility] - [place] Sellers, Lively and Willa Frater, MD; Dianna Rossetti, MD; Frances [place], MD; Nicole Kindred, MD; [place], [place]; Marijean Bravo, MD; Louanne Skye, MD; Benjamine Mola, MD [address], [place],  [address] [phone] M-F: 8-5, Sat: 9-4, Sun  9-12 Medicaid - yes; Tricare - yes  [place], PNP; Rosana Hoes, [place] [address] Nyoka Lint [place], [place] [address] [phone] M-F 8 - 5, closed 12-1 for lunch Medicaid - yes; Tricare - yes  [place], MD; Enid Derry, MD; Benjamine Mola, MD; [place], [place], [place], [place] [address] [phone] M-F 8- 5:30 Medicaid - yes; Tricare - yes  Rafael Hernandez, MD; Jeanell Sparrow, MD; Bartholome Bill, [place], [place], [place] [address] [phone] Jerilynn Mages: Sharyne Richters; Tues-Fri: 8-5; Sat: 9-12 Medicaid - yes; Tricare - yes  Signa [facility] - Myrene Buddy, MD; Bjorn Loser, MD; Carlis Abbott, MD; Claudean Kinds, MD; Erik Obey, MD [address] [place], [place]  [address] [phone] Jerilynn MagesMarland Kitchen Sharyne RichtersDarrin Luis: 8-5; Sat: 8:30-12:30 Medicaid - yes; Tricare - yes  Dareen Piano [place], MD; [place], [place], [place], [place] [address] [phone] Mon-Fri: 8-5 Medicaid - yes; Tricare - yes  [facility] [place] Beasley, CPNP; [place], [place]; Benjamine Mola, MD; Donivan Scull, MD; Noel Journey, MD; [address] [place], [place] [address] [phone] M-F: 8-5, closed 1-2 for lunch Medicaid - yes; Tricare - yes  [place], [place]; [place], [place]; Tamala Julian, MD; [place], CPNP; [place], [place]; [place], MD; Juleen China, MD [address] [place], [place], [place] [address] [phone] M-Thurs: Sharyne Richters; Fri: 8-6; Sat: 9-12; Sun 2-4 Medicaid - yes; Tricare - yes  Signa Kell [facility] [place], MD; Vernard Gambles, MD; Carol Ada, FNP; Marjorie Smolder, DO; [address], [place], [place] [address] [phone] M-F: 8-5 Medicaid - yes; [place] - yes  [facility] Pediatric Providers  [place], MD; [place], [place], [place], [place] [address] [phone] M - Fri: 8am - 5pm, closed for lunch 12-1 Medicaid - Yes; Tricare - yes  [facility] and [place], MD; Ovid Curd, MD; [place], DO; Vin[place], MD;Hall, PA; Volanda Napoleon, [place]; Megan Salon, NP [phone] [address], [place], [place] [address] [phone] M-F 8:00 - 5:00, Sat 8:00 - 11:30 Medicaid - yes; Tricare - yes  [facility] Humphrey Rolls, MD; [place], MD, [address] MD, [place], MD, [place], MD; [place], NP; [place], [place];  [address], [place], [place] [address] [phone] M-F 8:10am - 5:00pm Medicaid - yes; [place] - yes  [place], MD; [place], NP [address], [place], [place]  [address] [phone] M-F 8:00 - 5:00 Medicaid - [place] Medicaid only; Tricare - yes  [place], MD; [place], NP [place], [place], [place] [address] [phone] M-F 8:00 - 5:00; Closed for lunch 12 - 1:00 Medicaid - yes; Tricare - yes  [place], MD; Darius Bump, [place], [place], [place] [address] [phone] Mon 9-5; Tues/Wed 10-5; Thurs 8:30-5; Fri: 8-12:30 Medicaid - yes; Tricare - yes  [facility] Pediatric Providers  [facility]  [place], MD; [place], [place] [address], [place], [place] [address] [phone] phone [phone] fax M-F 7:15 - 4:30 Medicaid - yes; Tricare - yes  [facility] Anastasio Champion, MD; [place], DO [address] [place],  [address] [phone] M-Fri: 8:30 - 5:00, closed for lunch everyday noon - 1pm Medicaid - Yes;  Tricare - yes  [facility] [place], MD; Quillian Quince, MD; Nadara Mustard, MD; Quintin Alto, MD; [place], [place]; Georgianne Fick; [place], [place]; [place], [place]; [place], [place]; [place], [place] [address] [place], [place] [address] [phone] M-Thurs: 7:30am - 7:00pm; Friday 7:30am - 4pm; Sat: 8:00 - 1:00 Medicaid - Yes; Tricare - yes  [facility] [place] Busman, MD; Lanny [place], MD; Janit Bern, MD; [place], [place]. [address] [place], [place], [place] [address] [phone] M-Thur: 8:00 - 5:00, Fri: 8:00 - Noon Medicaid - yes; Tricare - yes No [place], MD; Lajuana Ripple, DO; [place], NP; Hassell Done, NP; Lilia Pro, NP; Jeneen Montgomery, NP; Thayer Ohm, NP; Livia Snellen, MD; [place], [place] [address] [place] [address], [place], [place] [address] [phone] M-F 8:00 - 5:00 Medicaid - yes; Tricare - yes  Zillah Collins, FNP-C; [place], FNP-C [address] [place], [place] [address] [phone] M, W, R 8:00-5:00, Tues: 8:00am - 7:00pm;  Fri 8:00 - noon Medicaid - Yes; Tricare - yes  [facility], MD [place], [place] [address] [phone]  M-Thurs 8:30-5:30, Fri: 8:30-12:30pm Medicaid - Yes; Tricare - N

## 2023-12-22 NOTE — Progress Notes (Signed)
 PRENATAL VISIT NOTE  Subjective:  [name] is a 43 y.o. G2P0010 at [redacted]w[redacted]d being seen today for ongoing prenatal care.  She is currently monitored for the following issues for this high-risk pregnancy and has History of ST elevation myocardial infarction; Type 2 diabetes mellitus with obesity (HCC); Essential hypertension; History of gastric surgery; Supervision of high risk pregnancy, antepartum; Preexisting diabetes complicating pregnancy, antepartum; Preexisting hypertension complicating pregnancy, antepartum; AMA (advanced maternal age) 36+; Anxiety; Sickle cell trait (HCC); Maternal obesity affecting pregnancy, antepartum; Cardiac complication; and BMI 40.0-44.9, adult (HCC) on their problem list.  Patient reports no complaints.  Contractions: Irritability. Vag. Bleeding: None.  Movement: Present. Denies leaking of fluid.   The following portions of the patient's history were reviewed and updated as appropriate: allergies, current medications, past family history, past medical history, past social history, past surgical history and problem list.   Objective:   Vitals:   12/22/23 1558  BP: 128/83  Pulse: 73  Weight: 265 lb (120.2 kg)    Fetal Status: Fetal Heart Rate (bpm): 144   Movement: Present     General:  Alert, oriented and cooperative. Patient is in no acute distress.  Skin: Skin is warm and dry. No rash noted.   Cardiovascular: Normal heart rate noted  Respiratory: Normal respiratory effort, no problems with respiration noted  Abdomen: Soft, gravid, appropriate for gestational age.  Pain/Pressure: Present     Pelvic: Cervical exam deferred        Extremities: Normal range of motion.  Edema: Trace  Mental Status: Normal mood and affect. Normal behavior. Normal judgment and thought content.   [name] MFM OB FOLLOW UP Result Date: 12/05/2023 ----------------------------------------------------------------------  OBSTETRICS REPORT                       (Signed Final  12/05/2023 03:24 pm) ---------------------------------------------------------------------- Patient Info  ID #:       [id number]                          D.O.B.:  July 21, 1981 (42 yrs)(F)  Name:       [name]              Visit Date: 12/05/2023 01:20 pm ---------------------------------------------------------------------- Performed By  Attending:        Braxton Feathers DO       Ref. Address:     [address]                                                             [place], [place]                                                             [address]  Performed By:     Marcellina Millin       [facility]  Referred By:      Tereso Newcomer MD ---------------------------------------------------------------------- Orders  #  Description                           Code        Ordered By  1  [name] MFM OB FOLLOW UP                   84132.44    Braxton Feathers ----------------------------------------------------------------------  #  Order #                     Accession #                Episode #  1  [id number]                   [id number]                 [id number] ---------------------------------------------------------------------- Indications  Pre-existing diabetes, type 2, in pregnancy,   O24.112  second trimester (insulin)  Obesity complicating pregnancy, second         O99.212  trimester (BMI 41)  Advanced maternal age multigravida 40+,        O49.522  second trimester (35)  Medical complication of pregnancy (Gastric     O26.90  surgery)  Hypertension - Chronic/Pre-existing            O10.019  History of sickle cell trait                   Z86.2  Encounter for other antenatal screening        Z36.2  follow-up  [redacted] weeks gestation of pregnancy                Z3A.26  LR  NIPS ---------------------------------------------------------------------- Vital Signs  BP:          127/65 ---------------------------------------------------------------------- Fetal Evaluation  Num Of Fetuses:         1  Fetal Heart Rate(bpm):  151  Cardiac Activity:       Observed  Presentation:           Cephalic  Placenta:               Anterior  P. Cord Insertion:      Visualized, central  Amniotic Fluid  AFI FV:      Within normal limits                              Largest Pocket(cm)  6.58 ---------------------------------------------------------------------- Biometry  BPD:      64.1  mm     G. Age:  25w 6d         23  %    CI:        72.79   %    70 - 86                                                          FL/HC:      20.9   %    18.6 - 20.4  HC:      238.9  mm     G. Age:  26w 0d         12  %    HC/AC:      1.11        1.04 - 1.22  AC:      215.8  mm     G. Age:  26w 0d         30  %    FL/BPD:     77.8   %    71 - 87  FL:       49.9  mm     G. Age:  26w 6d         49  %    FL/AC:      23.1   %    20 - 24  CER:      32.2  mm     G. Age:  27w 4d         85  %  LV:        3.5  mm  Est. FW:     921  gm           2 lb     34  % ---------------------------------------------------------------------- OB History  Gravidity:    2          SAB:   1  Living:       0 ---------------------------------------------------------------------- Gestational Age  LMP:           27w 3d        Date:  05/27/23                   EDD:   03/02/24  U/S Today:     26w 1d                                        EDD:   03/11/24  Best:          26w 3d     Det. By:  U/S  (10/10/23)          EDD:   03/09/24 ---------------------------------------------------------------------- Anatomy  Cranium:               Previously seen        Aortic Arch:            Previously seen  Cavum:                 Previously seen        Ductal Arch:            Previously seen  Ventricles:  Appears normal         Diaphragm:               Appears normal  Choroid Plexus:        Previously seen        Stomach:                Appears normal, left                                                                        sided  Cerebellum:            Appears normal         Abdomen:                Previously seen  Posterior Fossa:       Appears normal         Abdominal Wall:         Previously seen  Face:                  Orbits and profile     Cord Vessels:           Previously seen                         previously seen  Lips:                  Previously seen        Kidneys:                Appear normal  Thoracic:              Previously seen        Bladder:                Appears normal  Heart:                 Appears normal         Spine:                  Previously seen                         (4CH, axis, and                         situs)  RVOT:                  Previously seen        Upper Extremities:      Previously seen  LVOT:                  Previously seen        Lower Extremities:      Previously seen  Other:  Female gender previously seen. Fetal anatomic survey complete on          prior scans. ---------------------------------------------------------------------- Cervix Uterus Adnexa  Cervix  Length:            3.7  cm.  Normal appearance by transabdominal scan ---------------------------------------------------------------------- Comments  The patient is here for ultrasound. Her pregnancy is  complicated by DM2, AMA, CHTN She has no further  concerns today. She reports nearly all of her blood sugars  are well controlled except some at dinner. She is taking  Latnus 14 [name] BID.  Sonographic findings  Single intrauterine pregnancy at 26w 3d.  Fetal cardiac activity:  Observed and appears normal.  Presentation: Cephalic.  Interval fetal anatomy appears normal.  Fetal biometry shows the estimated fetal weight at the 34  percentile.  Amniotic fluid volume: Within normal limits. MVP: 6.58 cm.  Placenta: Anterior.  Recommendations  1.  Serial growth ultrasounds every 4-6 weeks until delivery  2. Antenatal testing to start around 32 weeks  3. Delivery around 37-[redacted] weeks gestation  There are limitations of prenatal ultrasound such as the  inability to detect certain abnormalities due to poor  visualization. Various factors such as fetal position,  gestational age and maternal body habitus may increase the  difficulty in visualizing the fetal anatomy. ----------------------------------------------------------------------                  Braxton Feathers, DO Electronically Signed Final Report   12/05/2023 03:24 pm ----------------------------------------------------------------------    Assessment and Plan:  Pregnancy: G2P0010 at [redacted]w[redacted]d 1. Preexisting diabetes complicating pregnancy, antepartum (Primary) Blood sugars reviewed, mostly with range. Continue Lantus and Novolog as prescribed.  Continue with MFM recommended scans and will start weekly antenatal testing in the third trimester. Surveillance labs today. - TSH+T4F+T3Free - Hemoglobin A1c - RPR  2. Preexisting hypertension complicating pregnancy, antepartum 3. History of ST elevation myocardial infarction Stable BP on Amlodipine and Lopressor. Followed by Dr. Servando Salina (Cardiology) closely.  Continue with MFM recommended scans and will start weekly antenatal testing in the third trimester. Surveillance labs today. - Protein / creatinine ratio, urine - Comprehensive metabolic panel - CBC  4. Multigravida of advanced maternal age in third trimester LR NIPS. Scans and antenatal testing as per MFM recommendations.   5. [redacted] weeks gestation of pregnancy 6. Supervision of high risk pregnancy, antepartum Third trimester labs today, will follow up results and manage accordingly. - HIV Antibody (routine testing w rflx) - CBC - RPR Tdap declined today, information given to her to review at home Third trimester expectations reviewed and all questions answered. Preterm labor symptoms  and general obstetric precautions including but not limited to vaginal bleeding, contractions, leaking of fluid and fetal movement were reviewed in detail with the patient. Please refer to After Visit Summary for other counseling recommendations.   Return in about 13 days (around 01/04/2024) for OB visits as scheduled.  Future Appointments  Date Time Provider Department Center  01/02/2024  9:15 AM [facility] NURSE [facility]  01/02/2024  9:30 AM [facility] US1 [facility]  01/04/2024  9:35 AM Reva Bores, MD [facility]  01/06/2024  1:20 PM Tobb, Lavona Mound, DO CVD-W[facility] None  01/06/2024  1:40 PM Cheree Ditto, [facility] None  01/16/2024  8:30 AM [facility] NURSE [facility]  01/16/2024  8:45 AM [facility] NST [facility]  01/16/2024  9:30 AM [facility] US3 [facility]  01/18/2024  3:50 PM Reva Bores, MD [facility]  01/19/2024  8:30 AM [facility] NURSE [facility]  01/19/2024  8:45 AM [facility] NST [facility]  01/23/2024  8:30 AM [facility] NURSE [facility]  01/23/2024  8:45 AM [facility] NST [facility]  01/26/2024  8:30 AM [facility] NURSE [facility]  01/26/2024  8:45 AM [facility] NST [facility]  01/30/2024  8:15 AM [facility] NURSE [facility]  01/30/2024  8:30 AM  [facility] US2 [facility]  02/01/2024  2:30 PM Jerime Arif, Jethro Bastos, MD [facility]  02/15/2024  4:10 PM Reva Bores, MD [facility]  02/22/2024  4:10 PM [place] Bing, MD [facility]  02/29/2024  4:10 PM Carlynn Herald, CNM [facility]  03/07/2024  4:10 PM Nelson Bing, MD [facility]    Jaynie Collins, MD

## 2023-12-23 ENCOUNTER — Encounter: Payer: Self-pay | Admitting: Obstetrics & Gynecology

## 2023-12-23 LAB — CBC
Hematocrit: 35.9 % (ref 34.0–46.6)
Hemoglobin: 12 g/dL (ref 11.1–15.9)
MCH: 28 pg (ref 26.6–33.0)
MCHC: 33.4 g/dL (ref 31.5–35.7)
MCV: 84 fL (ref 79–97)
Platelets: 252 10*3/uL (ref 150–450)
RBC: 4.29 x10E6/uL (ref 3.77–5.28)
RDW: 13.1 % (ref 11.7–15.4)
WBC: 10.2 10*3/uL (ref 3.4–10.8)

## 2023-12-23 LAB — COMPREHENSIVE METABOLIC PANEL WITH GFR
ALT: 10 IU/L (ref 0–32)
AST: 17 IU/L (ref 0–40)
Albumin: 3.2 g/dL — ABNORMAL LOW (ref 3.9–4.9)
Alkaline Phosphatase: 73 IU/L (ref 44–121)
BUN/Creatinine Ratio: 20 (ref 9–23)
BUN: 15 mg/dL (ref 6–24)
Bilirubin Total: 0.2 mg/dL (ref 0.0–1.2)
CO2: 17 mmol/L — ABNORMAL LOW (ref 20–29)
Calcium: 9.2 mg/dL (ref 8.7–10.2)
Chloride: 104 mmol/L (ref 96–106)
Creatinine, Ser: 0.75 mg/dL (ref 0.57–1.00)
Globulin, Total: 2.9 g/dL (ref 1.5–4.5)
Glucose: 114 mg/dL — ABNORMAL HIGH (ref 70–99)
Potassium: 4.7 mmol/L (ref 3.5–5.2)
Sodium: 137 mmol/L (ref 134–144)
Total Protein: 6.1 g/dL (ref 6.0–8.5)
eGFR: 101 mL/min/{1.73_m2} (ref >59)

## 2023-12-23 LAB — HIV ANTIBODY (ROUTINE TESTING W REFLEX): HIV Screen 4th Generation wRfx: NONREACTIVE

## 2023-12-23 LAB — PROTEIN / CREATININE RATIO, URINE
Creatinine, Urine: 41 mg/dL
Protein, Ur: 4.3 mg/dL
Protein/Creat Ratio: 105 mg/g{creat} (ref 0–200)

## 2023-12-23 LAB — TSH+T4F+T3FREE
Free T4: 0.9 ng/dL (ref 0.82–1.77)
T3, Free: 2.9 pg/mL (ref 2.0–4.4)
TSH: 1.32 u[IU]/mL (ref 0.450–4.500)

## 2023-12-23 LAB — HEMOGLOBIN A1C
Est. average glucose Bld gHb Est-mCnc: 114 mg/dL
Hgb A1c MFr Bld: 5.6 % (ref 4.8–5.6)

## 2023-12-23 LAB — RPR: RPR Ser Ql: NONREACTIVE

## 2023-12-26 ENCOUNTER — Ambulatory Visit: Payer: 59 | Admitting: Pharmacist Clinician (PhC)/ Clinical Pharmacy Specialist

## 2023-12-27 ENCOUNTER — Encounter: Payer: Self-pay | Admitting: *Deleted

## 2023-12-29 ENCOUNTER — Encounter: Payer: 59 | Admitting: Obstetrics and Gynecology

## 2023-12-30 ENCOUNTER — Ambulatory Visit: Admitting: Cardiology

## 2024-01-02 ENCOUNTER — Ambulatory Visit: Payer: 59

## 2024-01-02 ENCOUNTER — Ambulatory Visit: Admitting: Maternal & Fetal Medicine

## 2024-01-02 ENCOUNTER — Other Ambulatory Visit: Payer: Self-pay | Admitting: *Deleted

## 2024-01-02 ENCOUNTER — Ambulatory Visit: Payer: 59 | Attending: Maternal & Fetal Medicine

## 2024-01-02 VITALS — BP 121/68 | HR 73

## 2024-01-02 DIAGNOSIS — O24319 Unspecified pre-existing diabetes mellitus in pregnancy, unspecified trimester: Secondary | ICD-10-CM | POA: Diagnosis present

## 2024-01-02 DIAGNOSIS — O99210 Obesity complicating pregnancy, unspecified trimester: Secondary | ICD-10-CM | POA: Insufficient documentation

## 2024-01-02 DIAGNOSIS — O09523 Supervision of elderly multigravida, third trimester: Secondary | ICD-10-CM | POA: Diagnosis present

## 2024-01-02 DIAGNOSIS — O10013 Pre-existing essential hypertension complicating pregnancy, third trimester: Secondary | ICD-10-CM

## 2024-01-02 DIAGNOSIS — O365930 Maternal care for other known or suspected poor fetal growth, third trimester, not applicable or unspecified: Secondary | ICD-10-CM

## 2024-01-02 DIAGNOSIS — O0990 Supervision of high risk pregnancy, unspecified, unspecified trimester: Secondary | ICD-10-CM | POA: Insufficient documentation

## 2024-01-02 DIAGNOSIS — Z3A30 30 weeks gestation of pregnancy: Secondary | ICD-10-CM | POA: Diagnosis present

## 2024-01-02 DIAGNOSIS — O99213 Obesity complicating pregnancy, third trimester: Secondary | ICD-10-CM | POA: Diagnosis not present

## 2024-01-02 DIAGNOSIS — O24112 Pre-existing diabetes mellitus, type 2, in pregnancy, second trimester: Secondary | ICD-10-CM

## 2024-01-02 DIAGNOSIS — O09522 Supervision of elderly multigravida, second trimester: Secondary | ICD-10-CM | POA: Diagnosis present

## 2024-01-02 DIAGNOSIS — O10919 Unspecified pre-existing hypertension complicating pregnancy, unspecified trimester: Secondary | ICD-10-CM | POA: Insufficient documentation

## 2024-01-02 DIAGNOSIS — E119 Type 2 diabetes mellitus without complications: Secondary | ICD-10-CM

## 2024-01-02 DIAGNOSIS — Z794 Long term (current) use of insulin: Secondary | ICD-10-CM

## 2024-01-02 NOTE — Progress Notes (Signed)
   Patient information  Patient Name: [name]  Patient MRN:   [id number]  Referring practice: MFM Referring Provider: [facility])  MFM CONSULT  [name] is a 43 y.o. G2P0010 at [redacted]w[redacted]d here for ultrasound and consultation. Patient Active Problem List   Diagnosis Date Noted   BMI 40.0-44.9, adult (HCC) 11/17/2023   Maternal obesity affecting pregnancy, antepartum 09/11/2023   Cardiac complication 09/11/2023   Sickle cell trait (HCC) 09/03/2023   Preexisting diabetes complicating pregnancy, antepartum 08/17/2023   Preexisting hypertension complicating pregnancy, antepartum 08/17/2023   AMA (advanced maternal age) 58+ 08/17/2023   Anxiety    Supervision of high risk pregnancy, antepartum 08/09/2023   History of gastric surgery 09/03/2019   Essential hypertension 09/05/2018   History of ST elevation myocardial infarction 07/24/2017   Type 2 diabetes mellitus with obesity (HCC) 07/24/2017    Gregg D Voiles is doing well today with no acute concerns.  I discussed the ultrasound findings today with the patient including the lower normal growth.  I discussed this could represent some form of placental dysfunction and close monitoring is required.  She reports normal fetal movement.  She reports that her blood sugars are well-controlled for the most part.  She is going to bring her blood glucose log to her OB provider for review.  Due to the decline in fetal growth we will do an NST next week.  Growth will be done in 3 weeks.  Sonographic findings Single intrauterine pregnancy at 30w 3d.  Fetal cardiac activity:  Observed and appears normal. Presentation: Cephalic. Interval fetal anatomy appears normal. Fetal biometry shows the estimated fetal weight at the 11 percentile. Amniotic fluid volume: Within normal limits. MVP: 4.39 cm. Placenta: Anterior.  There are limitations of prenatal ultrasound such as the inability to detect certain  abnormalities due to poor visualization. Various factors such as fetal position, gestational age and maternal body habitus may increase the difficulty in visualizing the fetal anatomy.    Recommendations -F/u growth in 3 weeks. -NST next week and then weekly NSTs. -Delivery around [redacted] weeks gestation or sooner if indicated.   Review of Systems: A review of systems was performed and was negative except per HPI   Vitals and Physical Exam    01/02/2024    9:52 AM 12/22/2023    3:58 PM 12/08/2023    4:16 PM  Vitals with BMI  Weight  265 lbs 261 lbs  BMI   44.78  Systolic 121 128 409  Diastolic 68 83 82  Pulse 73 73 72    Sitting comfortably on the sonogram table Nonlabored breathing Normal rate and rhythm Abdomen is nontender  Past pregnancies OB History  Gravida Para Term Preterm AB Living  2 0 0 0 1 0  SAB IAB Ectopic Multiple Live Births  1 0 0 0 0    # Outcome Date GA Lbr Len/2nd Weight Sex Type Anes PTL Lv  2 Current           1 SAB 11/2021 [redacted]w[redacted]d            I spent 30 minutes reviewing the patients chart, including labs and images as well as counseling the patient about her medical conditions. Greater than 50% of the time was spent in direct face-to-face patient counseling.  Braxton Feathers  MFM,    01/02/2024  10:30 AM

## 2024-01-02 NOTE — Progress Notes (Signed)
 Opened in error

## 2024-01-04 ENCOUNTER — Ambulatory Visit: Payer: 59 | Admitting: Family Medicine

## 2024-01-04 VITALS — BP 123/85 | HR 81

## 2024-01-04 DIAGNOSIS — Z3A30 30 weeks gestation of pregnancy: Secondary | ICD-10-CM

## 2024-01-04 DIAGNOSIS — O10919 Unspecified pre-existing hypertension complicating pregnancy, unspecified trimester: Secondary | ICD-10-CM

## 2024-01-04 DIAGNOSIS — O99210 Obesity complicating pregnancy, unspecified trimester: Secondary | ICD-10-CM | POA: Diagnosis not present

## 2024-01-04 DIAGNOSIS — I252 Old myocardial infarction: Secondary | ICD-10-CM

## 2024-01-04 DIAGNOSIS — O0990 Supervision of high risk pregnancy, unspecified, unspecified trimester: Secondary | ICD-10-CM | POA: Diagnosis not present

## 2024-01-04 DIAGNOSIS — O24319 Unspecified pre-existing diabetes mellitus in pregnancy, unspecified trimester: Secondary | ICD-10-CM | POA: Diagnosis not present

## 2024-01-04 DIAGNOSIS — O09523 Supervision of elderly multigravida, third trimester: Secondary | ICD-10-CM

## 2024-01-04 NOTE — Progress Notes (Signed)
 PRENATAL VISIT NOTE  Subjective:  [name] is a 43 y.o. G2P0010 at [redacted]w[redacted]d being seen today for ongoing prenatal care.  She is currently monitored for the following issues for this high-risk pregnancy and has History of ST elevation myocardial infarction; Type 2 diabetes mellitus with obesity (HCC); Essential hypertension; History of gastric surgery; Supervision of high risk pregnancy, antepartum; Preexisting diabetes complicating pregnancy, antepartum; Preexisting hypertension complicating pregnancy, antepartum; AMA (advanced maternal age) 13+; Anxiety; Sickle cell trait (HCC); Maternal obesity affecting pregnancy, antepartum; Cardiac complication; and BMI 40.0-44.9, adult (HCC) on their problem list.  Patient reports no complaints.  Contractions: Not present. Vag. Bleeding: None.  Movement: Present. Denies leaking of fluid.   The following portions of the patient's history were reviewed and updated as appropriate: allergies, current medications, past family history, past medical history, past social history, past surgical history and problem list.   Objective:   Vitals:   01/04/24 0945  BP: 123/85  Pulse: 81    Fetal Status: Fetal Heart Rate (bpm): 155 Fundal Height: 30 cm Movement: Present     General:  Alert, oriented and cooperative. Patient is in no acute distress.  Skin: Skin is warm and dry. No rash noted.   Cardiovascular: Normal heart rate noted  Respiratory: Normal respiratory effort, no problems with respiration noted  Abdomen: Soft, gravid, appropriate for gestational age.  Pain/Pressure: Absent     Pelvic: Cervical exam deferred        Extremities: Normal range of motion.  Edema: None  Mental Status: Normal mood and affect. Normal behavior. Normal judgment and thought content.   Assessment and Plan:  Pregnancy: G2P0010 at [redacted]w[redacted]d 1. Preexisting hypertension complicating pregnancy, antepartum (Primary) BP is well controlled on Amlodipine and Metoprolol. Managed  by Cardio/OB Normal recent PIH labs.  2. Preexisting diabetes complicating pregnancy, antepartum CBGs reviewed. Her fastings are creeping up. Just increased her Lantus to 14 u q hs. Adjusted her Novolog and does a sliding scale based on Carb counting. Most are in range. Dinners are higher. May adjust dinner Novolog up if needed.  Recent A1C is 5.6.  3. Supervision of high risk pregnancy, antepartum Considering TDaP Taking Prenatal classes  4. Obesity affecting pregnancy, antepartum, unspecified obesity type   5. History of ST elevation myocardial infarction Followed by cardio OB On ASA Holding statin   6. Multigravida of advanced maternal age in third trimester LR NIPT  7. [redacted] weeks gestation of pregnancy   Preterm labor symptoms and general obstetric precautions including but not limited to vaginal bleeding, contractions, leaking of fluid and fetal movement were reviewed in detail with the patient. Please refer to After Visit Summary for other counseling recommendations.   Return in 2 weeks (on 01/18/2024).  Future Appointments  Date Time Provider Department Center  01/06/2024  1:20 PM Tobb, Kardie, DO [facility] None  01/06/2024  1:40 PM Cheree Ditto, [facility] None  01/09/2024  8:30 AM [facility] PROVIDER 1 [facility]  01/09/2024  8:45 AM [facility] NST [facility]  01/16/2024  8:15 AM [facility] PROVIDER 1 [facility]  01/16/2024  8:45 AM [facility] NST [facility]  01/16/2024  9:30 AM [facility]  01/18/2024  3:50 PM Reva Bores, MD [facility]  01/19/2024  8:15 AM [facility] PROVIDER 1 [facility]  01/19/2024  8:45 AM W[facility] NST [facility]  01/23/2024  8:15 AM [facility] PROVIDER 1 [facility]  01/23/2024  8:45 AM WMC-MFC NST [facility]  01/26/2024  8:15 AM WMC-M[facility]  PROVIDER 1 [facility]  01/26/2024  8:45 AM W[facility]MFC NST [facility]  01/30/2024  8:00 AM W[facility] PROVIDER 1 [facility]  01/30/2024  8:30 AM [facility] US2 [facility]  02/01/2024  2:30 PM Anyanwu, Kathrine Paris,  MD [facility]  02/15/2024  4:10 PM Granville Layer, MD [facility]  02/22/2024  4:10 PM Raynell Caller, MD [facility]  02/29/2024  4:10 PM Tari Fare, CNM [facility]  03/07/2024  4:10 PM Raynell Caller, MD [facility]    [place] Layer, MD

## 2024-01-05 NOTE — Progress Notes (Unsigned)
 Patient ID: [name]                 DOB: May 13, 1981                      MRN: [id number]     HPI: [name] is a 43 y.o. female referred by Dr. Emmette Harms, Cardio OB clinic  to HTN clinic. 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 She is currently [redacted] weeks pregnant (due 03/09/24).   Patient was  last seen by Dr. Tiffany Foerster on 01/04/24 for a routine prenatal visit. BP was 123/85, HR 81. No changes were made to her antihypertensive regimen. Her CBGs were reviewed, and Lantus was increased to 14 units at bedtime for elevated FBG.  Last A1C in April 2025 was controlled at 5.6%. At last MFM appointment on 01/02/24 with Dr. Nolan Battle, BP was well-controlled. Noted decline in fetal growth (11th percentile), planning for NST next week. Of note, she has been off statin during pregnancy. Dr. Emmette Harms previously recommended continuing atorvastatin  during pregnancy, but patient declined continued therapy. Will consider restating postpartum pending breastfeeding status.   Patient presents today in good spirits. She expresses that she has some anxiety about the decrease in fetal growth noted at her last MFM appointment. She is on Spring Break from school this week - has noted that FBG are slightly higher than usual (96-101 mg/dL instead of consistently < 95 mg/dL) with decreased physical activity. She is monitoring her blood pressure at home, denies readings with SBP > 140 mmHg. Endorses that she was getting headaches every 2-3 weeks, that improved with Tylenol , but no episodes over the past 2 weeks. Denies other symptoms of high BP including blurred vision, chest pain, swelling, ShOB at rest or with exertion. Endorses some fatigue. Reports occasional palpitations with increased exertion. Denies symptoms of hypotension including dizziness, lightheadedness.   Current HTN meds: amlodipine  5  mg daily, metoprolol  25 mg BID Fill history appropriate - denies missed doses  Current DM meds: Lantus 14 units before bed, Novolog  8-12 units with meals (carb counting)  BP goal: <130/80 mmHg  Social History:  Works for [employer]  Diet: - Dinner last night: squash, broccoli, [place] burger (no bun), beans, few tortilla chips  Exercise:  - Walking a lot at her work, treadmill when she's able.  Home BP readings: BP cuff has been validated: recalls readings from memory: 123/66 mmHg, Denies SBP > 140 mmHg.   Home CBG:  FBG over past few days 96-101 mg/dL, usually < 95 mg/dL. This AM she had an abnormally high FBG of 117 mg/dL, but she ate dinner later last night around 8:30PM. She also has been less active over the past week, since school is on spring break.   Wt Readings from Last 3 Encounters:  01/06/24 265 lb (120.2 kg)  12/22/23 265 lb (120.2 kg)  12/08/23 261 lb (118.4 kg)   BP Readings from Last 3 Encounters:  01/06/24 112/74  01/04/24 123/85  01/02/24 121/68   Pulse Readings from Last 3 Encounters:  01/06/24 75  01/04/24 81  01/02/24 73   CARPEG II Score = 8 points, 41 % primary cardiac event risk. WHO Class II-III, high risk pregnancy requiring frequent cardiology follow up.   Lab Results  Component Value Date   HGBA1C 5.6 12/22/2023  HGBA1C 6.8 (H) 08/22/2023   HGBA1C 6.2 (H) 10/19/2018   Renal function: Estimated Creatinine Clearance: 115.8 mL/min (by C-G formula based on SCr of 0.75 mg/dL).  Past Medical History:  Diagnosis Date   Anxiety    Back pain    Chest pain    Coronary artery disease    Coronary artery disease involving coronary bypass graft of native heart with angina pectoris (HCC) 08/02/2017   Diabetes mellitus    Difficulty swallowing pills    Fatigue    GERD (gastroesophageal reflux disease)    Gout    Hay fever    Headache    Heartburn    History of heart attack    History of varicella    HTN (hypertension)     Hyperlipidemia    Joint pain    Knee pain    Nervousness    Palpitations    Pure hypercholesterolemia with target low density lipoprotein (LDL) cholesterol less than 70 mg/dL 16/06/9603   Shortness of breath on exertion    Sickle cell trait (HCC) 09/03/2023   Vitamin D  deficiency 11/20/2018    Current Outpatient Medications on File Prior to Visit  Medication Sig Dispense Refill   acetaminophen  (TYLENOL ) 500 MG tablet Take 500 mg by mouth every 6 (six) hours as needed for mild pain (pain score 1-3) or headache.     aspirin  EC 81 MG tablet Take 2 tablets (162 mg total) by mouth daily. 300 tablet 2   BD PEN NEEDLE NANO 2ND GEN 32G X 4 MM MISC See admin instructions.     cholecalciferol (VITAMIN D3) 25 MCG (1000 UNIT) tablet Take 1,000 Units by mouth daily.     insulin  aspart (NOVOLOG  FLEXPEN) 100 UNIT/ML FlexPen Inject 8-12 Units into the skin 3 (three) times daily with meals. Take 8-12 units before each meal depending on the sugar range     LANTUS SOLOSTAR 100 UNIT/ML Solostar Pen Inject into the skin. PATIENT USES 12 UNITS IN THE MORNING AND 12 UNITS AT NIGHT.     polycarbophil (FIBERCON) 625 MG tablet Take 625 mg by mouth daily.     Prenatal 28-0.8 MG TABS Take 1 tablet by mouth daily.     Probiotic Product (PROBIOTIC DAILY PO) Take by mouth.     vitamin B-12 (CYANOCOBALAMIN) 100 MCG tablet Take 100 mcg by mouth daily.     amLODipine  (NORVASC ) 5 MG tablet Take 1 tablet (5 mg total) by mouth daily. 90 tablet 3   metoprolol  tartrate (LOPRESSOR ) 25 MG tablet Take 1 tablet (25 mg total) by mouth 2 (two) times daily. 180 tablet 3   No current facility-administered medications on file prior to visit.    No Known Allergies   Assessment/Plan:  1. Hypertension - Currently well-controlled in clinic and at home. Patient denies s/sx of hypotension. HR WNL. Given reports of fatigue, and recent findings of decline in fetal growth (11th percentile), will reduce dose of metoprolol  by 50%.  -  Continue amlodipine  5 mg daily - Decrease metoprolol  to 25 mg daily - will transition to metoprolol  succinate to allow for once daily dosing and 24 hour coverage - Continue to monitor BP at home at least once daily, at least 1-2 hours after taking medications. Call clinic if > 140/90 mmHg for consecutive days.    2. Type 2 Diabetes - Currently well-controlled, but patient noted slight increase in FBG over the past week, associated with decrease in physical activity while being off from work this week. Appropriate to  continue basal insulin  glargine (Lantus) at 14 units daily and prandial insulin  aspart (Novolog ) per sliding scale/carb counting, with close monitoring as she returns to work next week.  - Continue Lantus 14 units daily and Novolog  8-12 units with meals (carb counting) - Monitor fasting and pre-prandial BG daily  Follow-up Pharmacist in 1 month Dr. Emmette Harms in 1-2 months  Arthea Larsson, PharmD PGY1 Pharmacy Resident  Seen with PGY1 resident and in agreement with plan  Joelene Murrain, PharmD, BCACP, CDCES, CPP [address], [address] [place], [place], [address] Phone: [phone], Fax: [phone]

## 2024-01-06 ENCOUNTER — Ambulatory Visit: Admitting: Cardiology

## 2024-01-06 ENCOUNTER — Ambulatory Visit: Admitting: Pharmacist

## 2024-01-06 ENCOUNTER — Encounter: Payer: Self-pay | Admitting: Pharmacist

## 2024-01-06 VITALS — BP 112/74 | HR 75 | Ht 64.0 in | Wt 265.0 lb

## 2024-01-06 DIAGNOSIS — O10919 Unspecified pre-existing hypertension complicating pregnancy, unspecified trimester: Secondary | ICD-10-CM

## 2024-01-06 DIAGNOSIS — E0590 Thyrotoxicosis, unspecified without thyrotoxic crisis or storm: Secondary | ICD-10-CM | POA: Insufficient documentation

## 2024-01-06 DIAGNOSIS — E1165 Type 2 diabetes mellitus with hyperglycemia: Secondary | ICD-10-CM | POA: Insufficient documentation

## 2024-01-06 DIAGNOSIS — Z3A31 31 weeks gestation of pregnancy: Secondary | ICD-10-CM

## 2024-01-06 DIAGNOSIS — I2510 Atherosclerotic heart disease of native coronary artery without angina pectoris: Secondary | ICD-10-CM | POA: Insufficient documentation

## 2024-01-06 DIAGNOSIS — E042 Nontoxic multinodular goiter: Secondary | ICD-10-CM | POA: Insufficient documentation

## 2024-01-06 MED ORDER — METOPROLOL SUCCINATE ER 25 MG PO TB24: 25.0000 mg | ORAL_TABLET | Freq: Every day | ORAL | 3 refills | Status: DC

## 2024-01-06 NOTE — Patient Instructions (Signed)
 [name]

## 2024-01-09 ENCOUNTER — Ambulatory Visit: Attending: Maternal & Fetal Medicine | Admitting: *Deleted

## 2024-01-09 ENCOUNTER — Ambulatory Visit (HOSPITAL_BASED_OUTPATIENT_CLINIC_OR_DEPARTMENT_OTHER): Admitting: Maternal & Fetal Medicine

## 2024-01-09 VITALS — BP 119/72 | HR 82

## 2024-01-09 DIAGNOSIS — O24119 Pre-existing diabetes mellitus, type 2, in pregnancy, unspecified trimester: Secondary | ICD-10-CM | POA: Diagnosis not present

## 2024-01-09 DIAGNOSIS — O09523 Supervision of elderly multigravida, third trimester: Secondary | ICD-10-CM | POA: Diagnosis not present

## 2024-01-09 DIAGNOSIS — Z3A31 31 weeks gestation of pregnancy: Secondary | ICD-10-CM

## 2024-01-09 DIAGNOSIS — O0993 Supervision of high risk pregnancy, unspecified, third trimester: Secondary | ICD-10-CM

## 2024-01-09 DIAGNOSIS — O0990 Supervision of high risk pregnancy, unspecified, unspecified trimester: Secondary | ICD-10-CM

## 2024-01-09 NOTE — Procedures (Signed)
 [name] 04-27-1981 [redacted]w[redacted]d  Fetus A Non-Stress Test Interpretation for 01/09/24 (NST only)  Indication:  AMA, obesity, T2DM    Fetal Heart Rate A Mode: External Baseline Rate (A): 145 bpm Variability: Moderate Accelerations: 10 x 10 Decelerations: None Multiple birth?: No  Uterine Activity Mode: Palpation, Toco Contraction Frequency (min): None Resting Tone Palpated: Relaxed Resting Time: Adequate  Interpretation (Fetal Testing) Nonstress Test Interpretation: Reactive Overall Impression: Reassuring for gestational age Comments: Dr. Grayland Le reviewed tracing

## 2024-01-12 ENCOUNTER — Encounter: Payer: 59 | Admitting: Obstetrics & Gynecology

## 2024-01-16 ENCOUNTER — Ambulatory Visit (HOSPITAL_BASED_OUTPATIENT_CLINIC_OR_DEPARTMENT_OTHER)

## 2024-01-16 ENCOUNTER — Ambulatory Visit: Attending: Maternal & Fetal Medicine | Admitting: *Deleted

## 2024-01-16 ENCOUNTER — Other Ambulatory Visit: Payer: Self-pay | Admitting: Maternal & Fetal Medicine

## 2024-01-16 ENCOUNTER — Ambulatory Visit: Payer: 59

## 2024-01-16 ENCOUNTER — Ambulatory Visit

## 2024-01-16 ENCOUNTER — Ambulatory Visit (HOSPITAL_BASED_OUTPATIENT_CLINIC_OR_DEPARTMENT_OTHER): Admitting: Maternal & Fetal Medicine

## 2024-01-16 VITALS — BP 117/89 | HR 80

## 2024-01-16 DIAGNOSIS — O24313 Unspecified pre-existing diabetes mellitus in pregnancy, third trimester: Secondary | ICD-10-CM | POA: Insufficient documentation

## 2024-01-16 DIAGNOSIS — O10013 Pre-existing essential hypertension complicating pregnancy, third trimester: Secondary | ICD-10-CM | POA: Diagnosis not present

## 2024-01-16 DIAGNOSIS — O24319 Unspecified pre-existing diabetes mellitus in pregnancy, unspecified trimester: Secondary | ICD-10-CM

## 2024-01-16 DIAGNOSIS — O10919 Unspecified pre-existing hypertension complicating pregnancy, unspecified trimester: Secondary | ICD-10-CM

## 2024-01-16 DIAGNOSIS — O09523 Supervision of elderly multigravida, third trimester: Secondary | ICD-10-CM | POA: Diagnosis not present

## 2024-01-16 DIAGNOSIS — E669 Obesity, unspecified: Secondary | ICD-10-CM

## 2024-01-16 DIAGNOSIS — E119 Type 2 diabetes mellitus without complications: Secondary | ICD-10-CM | POA: Diagnosis not present

## 2024-01-16 DIAGNOSIS — Z3A32 32 weeks gestation of pregnancy: Secondary | ICD-10-CM | POA: Insufficient documentation

## 2024-01-16 DIAGNOSIS — O99213 Obesity complicating pregnancy, third trimester: Secondary | ICD-10-CM | POA: Insufficient documentation

## 2024-01-16 DIAGNOSIS — O0990 Supervision of high risk pregnancy, unspecified, unspecified trimester: Secondary | ICD-10-CM

## 2024-01-16 DIAGNOSIS — O24113 Pre-existing diabetes mellitus, type 2, in pregnancy, third trimester: Secondary | ICD-10-CM

## 2024-01-16 DIAGNOSIS — Z6841 Body Mass Index (BMI) 40.0 and over, adult: Secondary | ICD-10-CM

## 2024-01-16 DIAGNOSIS — O99210 Obesity complicating pregnancy, unspecified trimester: Secondary | ICD-10-CM

## 2024-01-16 DIAGNOSIS — O09522 Supervision of elderly multigravida, second trimester: Secondary | ICD-10-CM

## 2024-01-16 DIAGNOSIS — Z794 Long term (current) use of insulin: Secondary | ICD-10-CM | POA: Diagnosis not present

## 2024-01-16 DIAGNOSIS — Z3A36 36 weeks gestation of pregnancy: Secondary | ICD-10-CM

## 2024-01-16 DIAGNOSIS — O10913 Unspecified pre-existing hypertension complicating pregnancy, third trimester: Secondary | ICD-10-CM | POA: Insufficient documentation

## 2024-01-16 DIAGNOSIS — O24119 Pre-existing diabetes mellitus, type 2, in pregnancy, unspecified trimester: Secondary | ICD-10-CM

## 2024-01-16 NOTE — Procedures (Signed)
 [name] [birth year] [redacted]w[redacted]d  Fetus A Non-Stress Test Interpretation for 01/16/24  Indication: Diabetes and Advanced Maternal Age >40 years  Fetal Heart Rate A Mode: External Baseline Rate (A): 145 bpm Variability: Moderate Accelerations: 10 x 10, 15 x 15 (15x15 X1) Decelerations: None Multiple birth?: No  Uterine Activity Mode: Palpation, Toco Contraction Frequency (min): None Resting Tone Palpated: Relaxed Resting Time: Adequate  Interpretation (Fetal Testing) Nonstress Test Interpretation:  (Equivocal) Comments: There was only 1 15x15 accel. There was a slight change in baseline that gave the appearance of additional 15x15's, but cannot be documented as such. Pt to have BPP as originally scheduled. Dr. Nolan Battle reviewed tracing.

## 2024-01-16 NOTE — Progress Notes (Signed)
   Patient information  Patient Name: [name]  Patient MRN:   [id number]  Referring practice: MFM Referring Provider: [facility] OBGYN  MFM CONSULT  [name] is a 43 y.o. G2P0010 at [redacted]w[redacted]d here for ultrasound and consultation. Patient Active Problem List   Diagnosis Date Noted   Hyperglycemia due to type 2 diabetes mellitus (HCC) 01/06/2024   Non-toxic multinodular goiter 01/06/2024   Subclinical hyperthyroidism 01/06/2024   Coronary artery disease 01/06/2024   BMI 40.0-44.9, adult (HCC) 11/17/2023   Maternal obesity affecting pregnancy, antepartum 09/11/2023   Cardiac complication 09/11/2023   Sickle cell trait (HCC) 09/03/2023   Preexisting diabetes complicating pregnancy, antepartum 08/17/2023   Preexisting hypertension complicating pregnancy, antepartum 08/17/2023   AMA (advanced maternal age) 44+ 08/17/2023   Anxiety    Supervision of high risk pregnancy, antepartum 08/09/2023   History of gastric surgery 09/03/2019   Essential hypertension 09/05/2018   History of ST elevation myocardial infarction 07/24/2017   Type 2 diabetes mellitus with obesity (HCC) 07/24/2017    [name] is doing well today with no acute concerns.  The patients blood glucose values are well controlled for the most part. BP is at goal (117/89).  She is aware of the importance of continuing to monitor her blood pressure and blood sugar and report her glucose log to her doctors.   Sonographic findings Single intrauterine pregnancy. Fetal cardiac activity: Observed. Presentation: Transverse, head to maternal left. Interval fetal anatomy appears normal. Amniotic fluid: Within normal limits.  MVP: 2.47 cm. Placenta: Anterior. BPP: 8/10 (equivocal NST).  There are limitations of prenatal ultrasound such as the inability to detect certain abnormalities due to poor visualization. Various factors such as fetal position, gestational age and maternal body habitus  may increase the difficulty in visualizing the fetal anatomy.    Recommendations -Serial growth ultrasounds every 4 weeks until delivery -Antenatal testing to start around 32 weeks  -Delivery around [redacted] weeks gestation  Review of Systems: A review of systems was performed and was negative except per HPI   Vitals and Physical Exam    01/16/2024    8:33 AM 01/09/2024    8:37 AM 01/06/2024    1:27 PM  Vitals with BMI  Height   5\' 4"   Weight   265 lbs  BMI   45.46  Systolic 117 119 865  Diastolic 89 72 74  Pulse 80 82 75    Sitting comfortably on the sonogram table Nonlabored breathing Normal rate and rhythm Abdomen is nontender  Past pregnancies OB History  Gravida Para Term Preterm AB Living  2 0 0 0 1 0  SAB IAB Ectopic Multiple Live Births  1 0 0 0 0    # Outcome Date GA Lbr Len/2nd Weight Sex Type Anes PTL Lv  2 Current           1 SAB 11/2021 [redacted]w[redacted]d            I spent 20 minutes reviewing the patients chart, including labs and images as well as counseling the patient about her medical conditions. Greater than 50% of the time was spent in direct face-to-face patient counseling.  Penney Bowling  MFM, [facility]   01/16/2024  2:44 PM

## 2024-01-18 ENCOUNTER — Ambulatory Visit: Admitting: Family Medicine

## 2024-01-18 VITALS — BP 134/85 | HR 80 | Wt 270.2 lb

## 2024-01-18 DIAGNOSIS — O10919 Unspecified pre-existing hypertension complicating pregnancy, unspecified trimester: Secondary | ICD-10-CM

## 2024-01-18 DIAGNOSIS — E0590 Thyrotoxicosis, unspecified without thyrotoxic crisis or storm: Secondary | ICD-10-CM

## 2024-01-18 DIAGNOSIS — Z3A32 32 weeks gestation of pregnancy: Secondary | ICD-10-CM

## 2024-01-18 DIAGNOSIS — O99210 Obesity complicating pregnancy, unspecified trimester: Secondary | ICD-10-CM

## 2024-01-18 DIAGNOSIS — I252 Old myocardial infarction: Secondary | ICD-10-CM

## 2024-01-18 DIAGNOSIS — O09523 Supervision of elderly multigravida, third trimester: Secondary | ICD-10-CM

## 2024-01-18 DIAGNOSIS — O0990 Supervision of high risk pregnancy, unspecified, unspecified trimester: Secondary | ICD-10-CM

## 2024-01-18 DIAGNOSIS — O24319 Unspecified pre-existing diabetes mellitus in pregnancy, unspecified trimester: Secondary | ICD-10-CM

## 2024-01-18 NOTE — Progress Notes (Signed)
 PRENATAL VISIT NOTE  Subjective:  [name] is a 43 y.o. G2P0010 at [redacted]w[redacted]d being seen today for ongoing prenatal care.  She is currently monitored for the following issues for this high-risk pregnancy and has History of ST elevation myocardial infarction; Type 2 diabetes mellitus with obesity (HCC); Essential hypertension; History of gastric surgery; Supervision of high risk pregnancy, antepartum; Preexisting diabetes complicating pregnancy, antepartum; Preexisting hypertension complicating pregnancy, antepartum; AMA (advanced maternal age) 56+; Anxiety; Sickle cell trait (HCC); Maternal obesity affecting pregnancy, antepartum; Cardiac complication; BMI 40.0-44.9, adult (HCC); Hyperglycemia due to type 2 diabetes mellitus (HCC); Non-toxic multinodular goiter; Subclinical hyperthyroidism; and Coronary artery disease on their problem list.  Patient reports no complaints.  Contractions: Not present. Vag. Bleeding: None.  Movement: Present. Denies leaking of fluid.   The following portions of the patient's history were reviewed and updated as appropriate: allergies, current medications, past family history, past medical history, past social history, past surgical history and problem list.   Objective:   Vitals:   01/18/24 1555  BP: 134/85  Pulse: 80  Weight: 270 lb 3.2 oz (122.6 kg)    Fetal Status: Fetal Heart Rate (bpm): 147   Movement: Present     General:  Alert, oriented and cooperative. Patient is in no acute distress.  Skin: Skin is warm and dry. No rash noted.   Cardiovascular: Normal heart rate noted  Respiratory: Normal respiratory effort, no problems with respiration noted  Abdomen: Soft, gravid, appropriate for gestational age.  Pain/Pressure: Present     Pelvic: Cervical exam deferred        Extremities: Normal range of motion.  Edema: None  Mental Status: Normal mood and affect. Normal behavior. Normal judgment and thought content.   Assessment and Plan:   Pregnancy: G2P0010 at [redacted]w[redacted]d 1. Preexisting hypertension complicating pregnancy, antepartum (Primary) BP is well controlled on ASA, Norvasc  and Metoprolol . Last growth at 11%--Cardio OB decreased her B-blocker dosing.  2. Preexisting diabetes complicating pregnancy, antepartum Reviewed CBGs. Increasing fastings. Now up to 17 u lantus q hs. Varies her meal coverage depending on meals. On treadmill, which helps keep her sugars in range.  3. Subclinical hyperthyroidism Recent normal TSH.  4. Supervision of high risk pregnancy, antepartum   5. Obesity affecting pregnancy, antepartum, unspecified obesity type In testing with MFM Last NST was equivocal, and BPP was 8/10--reassurance given. Feels great FM now.  6. History of ST elevation myocardial infarction In with Cardio OB No CP or SOB On asa  7. Multigravida of advanced maternal age in third trimester LR NIPT  8. [redacted] weeks gestation of pregnancy   Preterm labor symptoms and general obstetric precautions including but not limited to vaginal bleeding, contractions, leaking of fluid and fetal movement were reviewed in detail with the patient. Please refer to After Visit Summary for other counseling recommendations.   Return in 2 weeks (on 02/01/2024).  Future Appointments  Date Time Provider Department Center  01/19/2024  8:15 AM [facility] PROVIDER 1 [facility]  01/19/2024  8:45 AM [facility] NST [facility]  01/23/2024  8:15 AM [facility] PROVIDER 1 [facility]  01/23/2024  8:45 AM [facility] NST [facility]  01/26/2024  8:15 AM [facility] PROVIDER 1 [facility]  01/26/2024  8:45 AM [facility] NST [facility]  01/27/2024  8:20 AM Tobb, Kardie, DO [facility]  01/30/2024  8:00 AM [facility] PROVIDER 1 [facility]  01/30/2024  8:30 AM [facility] US2 [facility]  02/01/2024  2:30 PM Anyanwu, Kathrine Paris, MD [facility]  02/10/2024  4:20 PM Sunny English, [facility] None  02/15/2024  4:10 PM Granville Layer, MD [facility]  02/22/2024  4:10  PM Raynell Caller, MD [facility]  02/29/2024  4:10 PM Tari Fare, CNM [facility]  03/07/2024  4:10 PM Raynell Caller, MD [facility]    Granville Layer, MD

## 2024-01-18 NOTE — Progress Notes (Signed)
 ROB: Wants to discuss NST from Monday

## 2024-01-19 ENCOUNTER — Ambulatory Visit: Attending: Maternal & Fetal Medicine

## 2024-01-19 ENCOUNTER — Ambulatory Visit: Payer: Self-pay

## 2024-01-19 ENCOUNTER — Ambulatory Visit

## 2024-01-19 DIAGNOSIS — O09523 Supervision of elderly multigravida, third trimester: Secondary | ICD-10-CM | POA: Diagnosis present

## 2024-01-19 DIAGNOSIS — I10 Essential (primary) hypertension: Secondary | ICD-10-CM

## 2024-01-19 DIAGNOSIS — Z6841 Body Mass Index (BMI) 40.0 and over, adult: Secondary | ICD-10-CM

## 2024-01-19 DIAGNOSIS — O99213 Obesity complicating pregnancy, third trimester: Secondary | ICD-10-CM | POA: Diagnosis not present

## 2024-01-19 DIAGNOSIS — Z3A32 32 weeks gestation of pregnancy: Secondary | ICD-10-CM | POA: Diagnosis not present

## 2024-01-19 DIAGNOSIS — O09513 Supervision of elderly primigravida, third trimester: Secondary | ICD-10-CM

## 2024-01-19 DIAGNOSIS — O10013 Pre-existing essential hypertension complicating pregnancy, third trimester: Secondary | ICD-10-CM | POA: Diagnosis not present

## 2024-01-19 DIAGNOSIS — E669 Obesity, unspecified: Secondary | ICD-10-CM

## 2024-01-19 DIAGNOSIS — O24113 Pre-existing diabetes mellitus, type 2, in pregnancy, third trimester: Secondary | ICD-10-CM | POA: Insufficient documentation

## 2024-01-19 DIAGNOSIS — I252 Old myocardial infarction: Secondary | ICD-10-CM

## 2024-01-19 NOTE — Procedures (Signed)
 [name] [birth year] [redacted]w[redacted]d  Fetus A Non-Stress Test Interpretation for 01/19/24  Indication: Diabetes, Advanced Maternal Age >40 years, and Obesity  Fetal Heart Rate A Mode: External Baseline Rate (A): 150 bpm Variability: Moderate Accelerations: 15 x 15 Decelerations: None Multiple birth?: No  Uterine Activity Mode: Palpation, Toco Contraction Frequency (min): None Resting Tone Palpated: Relaxed  Interpretation (Fetal Testing) Nonstress Test Interpretation: Reactive Comments: Reviewed with Dr. Arcola Kocher

## 2024-01-23 ENCOUNTER — Ambulatory Visit: Payer: Self-pay | Admitting: *Deleted

## 2024-01-23 ENCOUNTER — Ambulatory Visit: Attending: Maternal & Fetal Medicine

## 2024-01-23 ENCOUNTER — Ambulatory Visit

## 2024-01-23 VITALS — BP 118/63 | HR 79

## 2024-01-23 DIAGNOSIS — Z3A33 33 weeks gestation of pregnancy: Secondary | ICD-10-CM

## 2024-01-23 DIAGNOSIS — O09523 Supervision of elderly multigravida, third trimester: Secondary | ICD-10-CM

## 2024-01-23 DIAGNOSIS — E1169 Type 2 diabetes mellitus with other specified complication: Secondary | ICD-10-CM

## 2024-01-23 DIAGNOSIS — O99210 Obesity complicating pregnancy, unspecified trimester: Secondary | ICD-10-CM | POA: Insufficient documentation

## 2024-01-23 DIAGNOSIS — O24113 Pre-existing diabetes mellitus, type 2, in pregnancy, third trimester: Secondary | ICD-10-CM | POA: Diagnosis not present

## 2024-01-23 DIAGNOSIS — O10913 Unspecified pre-existing hypertension complicating pregnancy, third trimester: Secondary | ICD-10-CM | POA: Diagnosis not present

## 2024-01-23 DIAGNOSIS — O10919 Unspecified pre-existing hypertension complicating pregnancy, unspecified trimester: Secondary | ICD-10-CM

## 2024-01-23 DIAGNOSIS — O0990 Supervision of high risk pregnancy, unspecified, unspecified trimester: Secondary | ICD-10-CM

## 2024-01-23 DIAGNOSIS — I10 Essential (primary) hypertension: Secondary | ICD-10-CM

## 2024-01-23 NOTE — Procedures (Signed)
 Tokiko D Kagan 25-Apr-1981 [redacted]w[redacted]d  Fetus A Non-Stress Test Interpretation for 01/23/24  Indication: Chronic Hypertenstion, Diabetes, Advanced Maternal Age >40 years, and Obesity - NST only  Fetal Heart Rate A Mode: External Baseline Rate (A): 135 bpm Variability: Moderate Accelerations: 15 x 15 Decelerations: None Multiple birth?: No  Uterine Activity Mode: Palpation, Toco Contraction Frequency (min): none Resting Tone Palpated: Relaxed  Interpretation (Fetal Testing) Nonstress Test Interpretation: Reactive Comments: Reviewed with Dr. Nolan Battle

## 2024-01-26 ENCOUNTER — Ambulatory Visit: Attending: Maternal & Fetal Medicine

## 2024-01-26 ENCOUNTER — Ambulatory Visit (HOSPITAL_BASED_OUTPATIENT_CLINIC_OR_DEPARTMENT_OTHER)

## 2024-01-26 ENCOUNTER — Other Ambulatory Visit: Payer: Self-pay | Admitting: Maternal & Fetal Medicine

## 2024-01-26 ENCOUNTER — Other Ambulatory Visit: Payer: Self-pay | Admitting: *Deleted

## 2024-01-26 ENCOUNTER — Ambulatory Visit (HOSPITAL_BASED_OUTPATIENT_CLINIC_OR_DEPARTMENT_OTHER): Admitting: Maternal & Fetal Medicine

## 2024-01-26 ENCOUNTER — Ambulatory Visit

## 2024-01-26 ENCOUNTER — Ambulatory Visit: Payer: Self-pay | Admitting: *Deleted

## 2024-01-26 VITALS — BP 114/64 | HR 84

## 2024-01-26 DIAGNOSIS — O289 Unspecified abnormal findings on antenatal screening of mother: Secondary | ICD-10-CM | POA: Diagnosis not present

## 2024-01-26 DIAGNOSIS — O10013 Pre-existing essential hypertension complicating pregnancy, third trimester: Secondary | ICD-10-CM

## 2024-01-26 DIAGNOSIS — O99213 Obesity complicating pregnancy, third trimester: Secondary | ICD-10-CM

## 2024-01-26 DIAGNOSIS — O0990 Supervision of high risk pregnancy, unspecified, unspecified trimester: Secondary | ICD-10-CM

## 2024-01-26 DIAGNOSIS — Z3A33 33 weeks gestation of pregnancy: Secondary | ICD-10-CM | POA: Diagnosis not present

## 2024-01-26 DIAGNOSIS — O24113 Pre-existing diabetes mellitus, type 2, in pregnancy, third trimester: Secondary | ICD-10-CM

## 2024-01-26 DIAGNOSIS — O99210 Obesity complicating pregnancy, unspecified trimester: Secondary | ICD-10-CM | POA: Diagnosis present

## 2024-01-26 DIAGNOSIS — E1169 Type 2 diabetes mellitus with other specified complication: Secondary | ICD-10-CM

## 2024-01-26 DIAGNOSIS — O10919 Unspecified pre-existing hypertension complicating pregnancy, unspecified trimester: Secondary | ICD-10-CM

## 2024-01-26 DIAGNOSIS — E669 Obesity, unspecified: Secondary | ICD-10-CM

## 2024-01-26 DIAGNOSIS — O99843 Bariatric surgery status complicating pregnancy, third trimester: Secondary | ICD-10-CM | POA: Diagnosis not present

## 2024-01-26 DIAGNOSIS — O09513 Supervision of elderly primigravida, third trimester: Secondary | ICD-10-CM | POA: Insufficient documentation

## 2024-01-26 DIAGNOSIS — Z362 Encounter for other antenatal screening follow-up: Secondary | ICD-10-CM | POA: Diagnosis not present

## 2024-01-26 DIAGNOSIS — O09523 Supervision of elderly multigravida, third trimester: Secondary | ICD-10-CM

## 2024-01-26 DIAGNOSIS — Z794 Long term (current) use of insulin: Secondary | ICD-10-CM | POA: Diagnosis not present

## 2024-01-26 DIAGNOSIS — Z3A34 34 weeks gestation of pregnancy: Secondary | ICD-10-CM

## 2024-01-26 DIAGNOSIS — Z862 Personal history of diseases of the blood and blood-forming organs and certain disorders involving the immune mechanism: Secondary | ICD-10-CM | POA: Diagnosis not present

## 2024-01-26 DIAGNOSIS — I10 Essential (primary) hypertension: Secondary | ICD-10-CM

## 2024-01-26 NOTE — Procedures (Signed)
 Durga D Hartline June 21, 1981 [redacted]w[redacted]d  Fetus A Non-Stress Test Interpretation for 01/26/24  Indication: Chronic Hypertenstion, Diabetes, Advanced Maternal Age >40 years, and Obesity  Fetal Heart Rate A Mode: External Baseline Rate (A): 150 bpm Variability: Moderate Accelerations: 15 x 15 Decelerations: Variable (one variable noted with small ctx) Multiple birth?: No  Uterine Activity Mode: Toco, Palpation Contraction Frequency (min): one noted in this segment Contraction Duration (sec): 50 Resting Tone Palpated: Relaxed  Interpretation (Fetal Testing) Nonstress Test Interpretation: Non-reactive Comments: Reviewed with Dr. Nolan Battle, BPP added

## 2024-01-26 NOTE — Progress Notes (Signed)
 Patient information  Patient Name: [name]  Patient MRN:   [id number]  Referring practice: MFM Referring Provider: [facility]  MFM CONSULT  [name] is a 43 y.o. G2P0010 at [redacted]w[redacted]d here for ultrasound and consultation. Patient Active Problem List   Diagnosis Date Noted   Hyperglycemia due to type 2 diabetes mellitus (HCC) 01/06/2024   Non-toxic multinodular goiter 01/06/2024   Subclinical hyperthyroidism 01/06/2024   Coronary artery disease 01/06/2024   BMI 40.0-44.9, adult (HCC) 11/17/2023   Maternal obesity affecting pregnancy, antepartum 09/11/2023   Cardiac complication 09/11/2023   Sickle cell trait (HCC) 09/03/2023   Preexisting diabetes complicating pregnancy, antepartum 08/17/2023   Preexisting hypertension complicating pregnancy, antepartum 08/17/2023   AMA (advanced maternal age) 71+ 08/17/2023   Anxiety    Supervision of high risk pregnancy, antepartum 08/09/2023   History of gastric surgery 09/03/2019   Essential hypertension 09/05/2018   History of ST elevation myocardial infarction 07/24/2017   Type 2 diabetes mellitus with obesity (HCC) 07/24/2017    Annel D Saraceni is doing well today with no acute concerns.  The patient is doing well today without complaint.  She had an NST that was equivocal.  It was reactive but there was a variable  decelerations and the variability was minimal at certain points (likely a sleep cycle).  A BPP was ordered which was 8 out of 8 given her total score of 8 out of 10.  She reports good fetal movement.  She reports that her blood sugars are well-controlled.  Her blood pressure today was in the low side 114/64.  She has an appoint with her cardiologist tomorrow.  I encouraged her to discuss discontinuing her Norvasc  and checking her blood pressures at home.  I discussed that the low blood pressure may potentially contribute to placental insufficiency but everything looks reassuring  today.  Sonographic findings Single intrauterine pregnancy. Fetal cardiac activity: Observed. Presentation: Transverse, head to maternal left. Interval fetal anatomy appears normal. Amniotic fluid: Within normal limits.  MVP: 4.38 cm. Placenta: Anterior. BPP: 8/10.   There are limitations of prenatal ultrasound such as the inability to detect certain abnormalities due to poor visualization. Various factors such as fetal position, gestational age and maternal body habitus may increase the difficulty in visualizing the fetal anatomy.    Recommendations -Increase testing to 2x per week due to a number of comorbidities -Cardiology appt tomorrow to discuss d/c Norvasc .  -Delivery timing will likely be around 37 weeks but may be earlier if indicated  Review of Systems: A review of systems was performed and was negative except per HPI   Vitals and Physical Exam    01/26/2024    8:20 AM 01/23/2024    8:26 AM 01/18/2024    3:55 PM  Vitals with BMI  Weight   270 lbs 3 oz  BMI   46.36  Systolic 114 118 409  Diastolic 64 63 85  Pulse 84 79 80    Sitting comfortably on the sonogram table Nonlabored breathing Normal rate and rhythm Abdomen is nontender  Past pregnancies OB History  Gravida Para Term Preterm AB Living  2 0 0 0 1 0  SAB IAB Ectopic Multiple Live Births  1 0 0 0 0    # Outcome Date GA Lbr Len/2nd Weight Sex Type Anes PTL Lv  2 Current           1 SAB 11/2021 [redacted]w[redacted]d  I spent 30 minutes reviewing the patients chart, including labs and images as well as counseling the patient about her medical conditions. Greater than 50% of the time was spent in direct face-to-face patient counseling.  Penney Bowling  MFM, [facility]   01/26/2024  11:25 AM

## 2024-01-27 ENCOUNTER — Ambulatory Visit: Attending: Cardiology | Admitting: Cardiology

## 2024-01-27 ENCOUNTER — Encounter: Payer: Self-pay | Admitting: Cardiology

## 2024-01-27 VITALS — BP 114/70 | HR 60 | Ht 64.0 in | Wt 265.4 lb

## 2024-01-27 DIAGNOSIS — O10919 Unspecified pre-existing hypertension complicating pregnancy, unspecified trimester: Secondary | ICD-10-CM | POA: Diagnosis not present

## 2024-01-27 DIAGNOSIS — E782 Mixed hyperlipidemia: Secondary | ICD-10-CM

## 2024-01-27 DIAGNOSIS — I2510 Atherosclerotic heart disease of native coronary artery without angina pectoris: Secondary | ICD-10-CM | POA: Diagnosis not present

## 2024-01-27 DIAGNOSIS — I10 Essential (primary) hypertension: Secondary | ICD-10-CM

## 2024-01-27 DIAGNOSIS — Z3A13 13 weeks gestation of pregnancy: Secondary | ICD-10-CM

## 2024-01-27 DIAGNOSIS — Z794 Long term (current) use of insulin: Secondary | ICD-10-CM

## 2024-01-27 DIAGNOSIS — E119 Type 2 diabetes mellitus without complications: Secondary | ICD-10-CM

## 2024-01-27 MED ORDER — AMLODIPINE BESYLATE 2.5 MG PO TABS: 2.5000 mg | ORAL_TABLET | Freq: Every day | ORAL | 3 refills | Status: DC

## 2024-01-27 NOTE — Patient Instructions (Signed)
 Medication Instructions:  Your physician has recommended you make the following change in your medication:  DECREASE: Amlodipine  2.5 mg once daily *If you need a refill on your cardiac medications before your next appointment, please call your pharmacy*   Follow-Up: At [facility], you and your health needs are our priority.  As part of our continuing mission to provide you with exceptional heart care, our providers are all part of one team.  This team includes your primary Cardiologist (physician) and Advanced Practice Providers or APPs (Physician Assistants and Nurse Practitioners) who all work together to provide you with the care you need, when you need it.  Your next appointment:    June 13th at [facility]  Provider:   Kardie Tobb, DO

## 2024-01-27 NOTE — Progress Notes (Unsigned)
 Cardio-Obstetrics Clinic  Follow Up Note   Date:  01/27/2024   ID:  HEIDY HENEGHAN, DOB [birth year], MRN [id number]  PCP:  Lesle Faron, DO   [facility] Providers Cardiologist:  Estefano Victory, DO  Electrophysiologist:  None        Referring MD: Dariusz Brase, DO   Chief Complaint: " I am ok"  History of Present Illness:    [name] is a 43 y.o. female [G2P0010] who returns for follow up at [redacted] weeks pregnant.  Medical history includes diabetes mellitus type 2, obesity she is status post lap band in 2009, coronary artery disease status post inferior wall MI at the age of 5, status post CABG (LIMA to LAD, left radial to diagonal, SVG to sequential to PDA and PLb this was done in November 2018, hypertension, hyperlipidemia.   She presents for a routine prenatal visit at [redacted] weeks gestation.  She is excited.  She offers no complaints at this time.  She is doing well. She reports that her obgyn team has re   Prior CV Studies Reviewed: The following studies were reviewed today: Reviewed echo  Past Medical History:  Diagnosis Date   Anxiety    Back pain    Chest pain    Coronary artery disease    Coronary artery disease involving coronary bypass graft of native heart with angina pectoris (HCC) 08/02/2017   Diabetes mellitus    Difficulty swallowing pills    Fatigue    GERD (gastroesophageal reflux disease)    Gout    Hay fever    Headache    Heartburn    History of heart attack    History of varicella    HTN (hypertension)    Hyperlipidemia    Joint pain    Knee pain    Nervousness    Palpitations    Pure hypercholesterolemia with target low density lipoprotein (LDL) cholesterol less than 70 mg/dL 40/98/1191   Shortness of breath on exertion    Sickle cell trait (HCC) 09/03/2023   Vitamin D  deficiency 11/20/2018    Past Surgical History:  Procedure Laterality Date   CORONARY ARTERY BYPASS GRAFT N/A 08/02/2017   Procedure: CORONARY ARTERY  BYPASS GRAFTING (CABG) X 5 (LIMA to LAD, LEFT RADIAL ARTERY to DIAGONAL, SVG to SEQUENTIALLY PDA and PLB) , USING LEFT INTERNAL MAMMARY ARTERY, LEFT RADIAL ARTERY, AND  SAPHENOUS VEIN to CIRCUMFLEX, RIGHT GREATER SAPHENOUS VEIN HARVESTED ENDOSCOPICALLY;  Surgeon: Norita Beauvais, MD;  Location: [facility] OR;  Service: Open Heart Surgery;  Laterality: N/A;   CORONARY/GRAFT ACUTE MI REVASCULARIZATION N/A 07/24/2017   Procedure: Coronary/Graft Acute MI Revascularization;  Surgeon: Arty Binning, MD;  Location: [facility] INVASIVE CV LAB;  Service: Cardiovascular;  Laterality: N/A;   LAPAROSCOPIC GASTRIC BANDING  09/02/2008   LEFT HEART CATH AND CORONARY ANGIOGRAPHY N/A 07/24/2017   Procedure: LEFT HEART CATH AND CORONARY ANGIOGRAPHY;  Surgeon: Arty Binning, MD;  Location: [facility] INVASIVE CV LAB;  Service: Cardiovascular;  Laterality: N/A;   RADIAL ARTERY HARVEST Left 08/02/2017   Procedure: LEFT RADIAL ARTERY HARVEST;  Surgeon: Norita Beauvais, MD;  Location: [facility] OR;  Service: Open Heart Surgery;  Laterality: Left;   STERNAL CLOSURE N/A 08/02/2017   Procedure: STERNAL PLATING;  Surgeon: Norita Beauvais, MD;  Location: [facility] OR;  Service: Open Heart Surgery;  Laterality: N/A;   STOMACH SURGERY  1982   had metal removed from stomach as a child   TEE WITHOUT CARDIOVERSION N/A 08/02/2017  Procedure: TRANSESOPHAGEAL ECHOCARDIOGRAM (TEE);  Surgeon: Norita Beauvais, MD;  Location: [facility] OR;  Service: Open Heart Surgery;  Laterality: N/A;      OB History     Gravida  2   Para  0   Term  0   Preterm  0   AB  1   Living  0      SAB  1   IAB  0   Ectopic  0   Multiple  0   Live Births  0               Current Medications: Current Meds  Medication Sig   acetaminophen  (TYLENOL ) 500 MG tablet Take 500 mg by mouth every 6 (six) hours as needed for mild pain (pain score 1-3) or headache.   amLODipine  (NORVASC ) 2.5 MG tablet Take 1 tablet (2.5 mg total) by mouth daily.   aspirin  EC 81 MG tablet  Take 2 tablets (162 mg total) by mouth daily.   BD PEN NEEDLE NANO 2ND GEN 32G X 4 MM MISC See admin instructions.   cholecalciferol (VITAMIN D3) 25 MCG (1000 UNIT) tablet Take 1,000 Units by mouth daily.   insulin  aspart (NOVOLOG  FLEXPEN) 100 UNIT/ML FlexPen Inject 8-12 Units into the skin 3 (three) times daily with meals. Take 8-12 units before each meal depending on the sugar range   LANTUS SOLOSTAR 100 UNIT/ML Solostar Pen Inject 17 Units into the skin at bedtime. PATIENT USES 17 UNITS IN THE MORNING AND 12 UNITS AT NIGHT.   metoprolol  succinate (TOPROL -XL) 25 MG 24 hr tablet Take 1 tablet (25 mg total) by mouth daily.   Prenatal 28-0.8 MG TABS Take 1 tablet by mouth daily.   Probiotic Product (PROBIOTIC DAILY PO) Take by mouth.   vitamin B-12 (CYANOCOBALAMIN) 100 MCG tablet Take 100 mcg by mouth daily.     Allergies:   Patient has no known allergies.   Social History   Socioeconomic History   Marital status: Married    Spouse name: Genel Kucera   Number of children: Not on file   Years of education: Not on file   Highest education level: Not on file  Occupational History   Occupation: Retail banker: [employer]  Tobacco Use   Smoking status: Never   Smokeless tobacco: Never  Vaping Use   Vaping status: Never Used  Substance and Sexual Activity   Alcohol use: Not Currently    Comment: occ   Drug use: No   Sexual activity: Not Currently  Other Topics Concern   Not on file  Social History Narrative   Not on file   Social Drivers of Health   Financial Resource Strain: Not on file  Food Insecurity: No Food Insecurity (08/17/2023)   Hunger Vital Sign    Worried About Running Out of Food in the Last Year: Never true    Ran Out of Food in the Last Year: Never true  Transportation Needs: No Transportation Needs (08/17/2023)   PRAPARE - Administrator, Civil Service (Medical): No    Lack of Transportation (Non-Medical): No   Physical Activity: Not on file  Stress: Not on file  Social Connections: Not on file      Family History  Problem Relation Age of Onset   Hyperlipidemia Mother    Hypertension Mother    Obesity Mother    Diabetes Father    Hypertension Father    Heart disease Father  Sudden death Father 74       died of MI   Cancer Maternal Grandmother        lung   Cancer Maternal Grandfather        prostate      ROS:   Please see the history of present illness.     All other systems reviewed and are negative.   Labs/EKG Reviewed:    EKG:   EKG was not ordered today.    Recent Labs: 12/22/2023: ALT 10; BUN 15; Creatinine, Ser 0.75; Hemoglobin 12.0; Platelets 252; Potassium 4.7; Sodium 137; TSH 1.320; TSH CANCELED   Recent Lipid Panel Lab Results  Component Value Date/Time   CHOL 141 05/20/2023 10:03 AM   TRIG 62 05/20/2023 10:03 AM   HDL 49 05/20/2023 10:03 AM   CHOLHDL 2.9 05/20/2023 10:03 AM   CHOLHDL 3.6 07/26/2017 02:50 AM   LDLCALC 79 05/20/2023 10:03 AM    Physical Exam:    VS:  BP 114/70 (BP Location: Left Arm, Patient Position: Sitting, Cuff Size: Large)   Pulse 60   Ht 5\' 4"  (1.626 m)   Wt 265 lb 6.4 oz (120.4 kg)   LMP 05/27/2023 (Exact Date)   SpO2 99%   BMI 45.56 kg/m     Wt Readings from Last 3 Encounters:  01/27/24 265 lb 6.4 oz (120.4 kg)  01/18/24 270 lb 3.2 oz (122.6 kg)  01/06/24 265 lb (120.2 kg)     GEN:  Well nourished, well developed in no acute distress HEENT: Normal NECK: No JVD; No carotid bruits LYMPHATICS: No lymphadenopathy CARDIAC: RRR, no murmurs, rubs, gallops RESPIRATORY:  Clear to auscultation without rales, wheezing or rhonchi  ABDOMEN: Soft, non-tender, non-distended MUSCULOSKELETAL:  No edema; No deformity  SKIN: Warm and dry NEUROLOGIC:  Alert and oriented x 3 PSYCHIATRIC:  Normal affect    Risk Assessment/Risk Calculators:           { Click for CHADS2VASc Score - THEN Refresh Note    :[id number]}       ASSESSMENT & PLAN:    Pregnancy in the setting of CAD No chest pain suspected for anginal. Will continue to monitor her closely. She has been off atorvastatin , we discussed at her last visit risk and benefit of keeping statin on given high risk ASCVD but patient declined.  Patient prefers to discontinue statin therapy during pregnancy. --Consider restarting statin therapy postpartum depending on breastfeeding status. Will closely monitor if needed will increase metoprolol  if suspect angina. Continue Aspirin   for preeclampsia prophylaxis   I have cut her down to Amlodipine  2.5 mg daily and we cut back on the metoprolol . If we take away these two antianginal she it is a very high probability she will not compensate for coronary flow well and get symptomatic. Not using These ( Amlodpine and Toprol  Xl) for blood pressure in this case but as antianginal.   CARPEG II Score = 8 points, 41 % primary cardiac event risk. WHO Class II-III, high risk pregnancy requiring frequent cardiology follow up.  She is following with [facility] OB specialty clinics  Prenatal care Continue with prenatal vitamins.  Hypertensive disorder in pregnancy is with chronic hypertension affecting pregnancy. -Monitor blood pressure daily at home. -Report any blood pressure readings consistently over 130 for three days or any reading over 140 immediately. -Refer to pharmacist for management of diabetes during pregnancy.  Plans for 37 weeks delivery per our MFM team -   -Plan for more frequent visits as pregnancy progresses (  every 4 weeks initially, then every 2 weeks closer to term).   Patient Instructions  Medication Instructions:  Your physician has recommended you make the following change in your medication:  DECREASE: Amlodipine  2.5 mg once daily *If you need a refill on your cardiac medications before your next appointment, please call your pharmacy*   Follow-Up: At [facility], you and your health  needs are our priority.  As part of our continuing mission to provide you with exceptional heart care, our providers are all part of one team.  This team includes your primary Cardiologist (physician) and Advanced Practice Providers or APPs (Physician Assistants and Nurse Practitioners) who all work together to provide you with the care you need, when you need it.  Your next appointment:    June 13th at [facility]  Provider:   Carline Dura, DO    Dispo:  No follow-ups on file.   Medication Adjustments/Labs and Tests Ordered: Current medicines are reviewed at length with the patient today.  Concerns regarding medicines are outlined above.  Tests Ordered: Orders Placed This Encounter  Procedures   EKG 12-Lead   Medication Changes: Meds ordered this encounter  Medications   amLODipine  (NORVASC ) 2.5 MG tablet    Sig: Take 1 tablet (2.5 mg total) by mouth daily.    Dispense:  90 tablet    Refill:  3

## 2024-01-30 ENCOUNTER — Ambulatory Visit

## 2024-01-30 ENCOUNTER — Other Ambulatory Visit: Payer: Self-pay | Admitting: *Deleted

## 2024-01-30 ENCOUNTER — Ambulatory Visit: Admitting: *Deleted

## 2024-01-30 ENCOUNTER — Telehealth: Payer: Self-pay

## 2024-01-30 ENCOUNTER — Ambulatory Visit (HOSPITAL_BASED_OUTPATIENT_CLINIC_OR_DEPARTMENT_OTHER): Admitting: Maternal & Fetal Medicine

## 2024-01-30 ENCOUNTER — Ambulatory Visit: Attending: Maternal & Fetal Medicine

## 2024-01-30 VITALS — BP 126/60 | HR 85

## 2024-01-30 DIAGNOSIS — O99210 Obesity complicating pregnancy, unspecified trimester: Secondary | ICD-10-CM | POA: Insufficient documentation

## 2024-01-30 DIAGNOSIS — O0990 Supervision of high risk pregnancy, unspecified, unspecified trimester: Secondary | ICD-10-CM | POA: Insufficient documentation

## 2024-01-30 DIAGNOSIS — Z3A34 34 weeks gestation of pregnancy: Secondary | ICD-10-CM | POA: Diagnosis present

## 2024-01-30 DIAGNOSIS — E669 Obesity, unspecified: Secondary | ICD-10-CM | POA: Insufficient documentation

## 2024-01-30 DIAGNOSIS — E1169 Type 2 diabetes mellitus with other specified complication: Secondary | ICD-10-CM

## 2024-01-30 DIAGNOSIS — O24119 Pre-existing diabetes mellitus, type 2, in pregnancy, unspecified trimester: Secondary | ICD-10-CM | POA: Diagnosis present

## 2024-01-30 DIAGNOSIS — O24113 Pre-existing diabetes mellitus, type 2, in pregnancy, third trimester: Secondary | ICD-10-CM

## 2024-01-30 DIAGNOSIS — O10013 Pre-existing essential hypertension complicating pregnancy, third trimester: Secondary | ICD-10-CM

## 2024-01-30 DIAGNOSIS — O10919 Unspecified pre-existing hypertension complicating pregnancy, unspecified trimester: Secondary | ICD-10-CM | POA: Diagnosis present

## 2024-01-30 DIAGNOSIS — E119 Type 2 diabetes mellitus without complications: Secondary | ICD-10-CM | POA: Diagnosis not present

## 2024-01-30 DIAGNOSIS — O09523 Supervision of elderly multigravida, third trimester: Secondary | ICD-10-CM

## 2024-01-30 DIAGNOSIS — Z006 Encounter for examination for normal comparison and control in clinical research program: Secondary | ICD-10-CM

## 2024-01-30 DIAGNOSIS — O365930 Maternal care for other known or suspected poor fetal growth, third trimester, not applicable or unspecified: Secondary | ICD-10-CM | POA: Diagnosis not present

## 2024-01-30 DIAGNOSIS — Z794 Long term (current) use of insulin: Secondary | ICD-10-CM

## 2024-01-30 DIAGNOSIS — O99213 Obesity complicating pregnancy, third trimester: Secondary | ICD-10-CM | POA: Diagnosis not present

## 2024-01-30 DIAGNOSIS — O3429 Maternal care due to uterine scar from other previous surgery: Secondary | ICD-10-CM

## 2024-01-30 NOTE — Telephone Encounter (Signed)
 Called to speak with pt regarding Impact Mom Trial. I emailed the pt the Impact Mom Trial information sheet. Pt is going to call or email me back once she has read the information.

## 2024-01-30 NOTE — Progress Notes (Signed)
   Patient information  Patient Name: [name]  Patient MRN:   [id number]  Referring practice: MFM Referring Provider: [facility]  MFM CONSULT  [name] is a 43 y.o. G2P0010 at [redacted]w[redacted]d here for ultrasound and consultation. Patient Active Problem List   Diagnosis Date Noted   Hyperglycemia due to type 2 diabetes mellitus (HCC) 01/06/2024   Non-toxic multinodular goiter 01/06/2024   Subclinical hyperthyroidism 01/06/2024   Coronary artery disease 01/06/2024   BMI 40.0-44.9, adult (HCC) 11/17/2023   Maternal obesity affecting pregnancy, antepartum 09/11/2023   Cardiac complication 09/11/2023   Sickle cell trait (HCC) 09/03/2023   Preexisting diabetes complicating pregnancy, antepartum 08/17/2023   Preexisting hypertension complicating pregnancy, antepartum 08/17/2023   AMA (advanced maternal age) 68+ 08/17/2023   Anxiety    Supervision of high risk pregnancy, antepartum 08/09/2023   History of gastric surgery 09/03/2019   Essential hypertension 09/05/2018   History of ST elevation myocardial infarction 07/24/2017   Type 2 diabetes mellitus with obesity (HCC) 07/24/2017   [name] is doing well today with no acute concerns.  The patient is doing well today without concern.  Since decreasing her amlodipine  her blood pressure is more normal today.  She continues to take metoprolol .  Her cardiologist reports that this is necessary to compensate for coronary blood flow and avoid anginal symptoms. Her blood sugars are well controlled.   I discussed the ultrasound findings of the NST which are both normal and reassuring today.  We discussed the importance of twice-weekly antenatal testing and delivery around 37 weeks.  Sonographic findings Single intrauterine pregnancy. Fetal cardiac activity: Observed. Presentation: Cephalic. Interval fetal anatomy appears normal. Amniotic fluid: Within normal limits.  MVP: 4.23 cm. Placenta:  Anterior. BPP: 10/10.   There are limitations of prenatal ultrasound such as the inability to detect certain abnormalities due to poor visualization. Various factors such as fetal position, gestational age and maternal body habitus may increase the difficulty in visualizing the fetal anatomy.    Recommendations -Continue twice weekly testing.  -Delivery at 37 weeks or sooner if indicated.   Review of Systems: A review of systems was performed and was negative except per HPI   Vitals and Physical Exam    01/30/2024    8:05 AM 01/27/2024    8:28 AM 01/26/2024    8:20 AM  Vitals with BMI  Height  5\' 4"    Weight  265 lbs 6 oz   BMI  45.53   Systolic 126 114 409  Diastolic 60 70 64  Pulse 85 60 84    Sitting comfortably on the sonogram table Nonlabored breathing Normal rate and rhythm Abdomen is nontender  Past pregnancies OB History  Gravida Para Term Preterm AB Living  2 0 0 0 1 0  SAB IAB Ectopic Multiple Live Births  1 0 0 0 0    # Outcome Date GA Lbr Len/2nd Weight Sex Type Anes PTL Lv  2 Current           1 SAB 11/2021 [redacted]w[redacted]d            I spent 20 minutes reviewing the patients chart, including labs and images as well as counseling the patient about her medical conditions. Greater than 50% of the time was spent in direct face-to-face patient counseling.  [name]  MFM, [facility]   01/30/2024  10:40 AM

## 2024-01-30 NOTE — Research (Signed)
 Pt replied to my previous email from this morning in reference Impact Mom. Has has read the Impact Mom information and has consented to participate in the trial.

## 2024-01-30 NOTE — Progress Notes (Signed)
 After review, MFM consult with provider is not indicated for today  Penney Bowling, DO 01/30/2024 4:20 PM  [facility]

## 2024-01-30 NOTE — Procedures (Signed)
 [name] Jan 04, 1981 [redacted]w[redacted]d  Fetus A Non-Stress Test Interpretation for 01/30/24 (U/S + NST)  Indication: Diabetes, Advanced Maternal Age >40 years, and Obesity  Fetal Heart Rate A Mode: External Baseline Rate (A): 150 bpm Variability: Moderate Accelerations: 15 x 15 Decelerations: None Multiple birth?: No  Uterine Activity Mode: Palpation, Toco Contraction Frequency (min): None Resting Tone Palpated: Relaxed Resting Time: Adequate  Interpretation (Fetal Testing) Nonstress Test Interpretation: Reactive Comments: Dr. Nolan Battle reviewed tracing.

## 2024-01-31 ENCOUNTER — Other Ambulatory Visit: Payer: Self-pay | Admitting: *Deleted

## 2024-01-31 DIAGNOSIS — O24113 Pre-existing diabetes mellitus, type 2, in pregnancy, third trimester: Secondary | ICD-10-CM

## 2024-01-31 DIAGNOSIS — O99213 Obesity complicating pregnancy, third trimester: Secondary | ICD-10-CM

## 2024-01-31 DIAGNOSIS — O09523 Supervision of elderly multigravida, third trimester: Secondary | ICD-10-CM

## 2024-02-01 ENCOUNTER — Ambulatory Visit

## 2024-02-01 ENCOUNTER — Other Ambulatory Visit (HOSPITAL_COMMUNITY)
Admission: RE | Admit: 2024-02-01 | Discharge: 2024-02-01 | Disposition: A | Discharge status: Home / Self Care | Source: Ambulatory Visit | Attending: Obstetrics & Gynecology | Admitting: Obstetrics & Gynecology

## 2024-02-01 ENCOUNTER — Encounter: Payer: Self-pay | Admitting: Obstetrics & Gynecology

## 2024-02-01 ENCOUNTER — Ambulatory Visit (INDEPENDENT_AMBULATORY_CARE_PROVIDER_SITE_OTHER): Admitting: Obstetrics & Gynecology

## 2024-02-01 VITALS — BP 128/78 | HR 79 | Wt 271.0 lb

## 2024-02-01 DIAGNOSIS — O24319 Unspecified pre-existing diabetes mellitus in pregnancy, unspecified trimester: Secondary | ICD-10-CM

## 2024-02-01 DIAGNOSIS — O0993 Supervision of high risk pregnancy, unspecified, third trimester: Secondary | ICD-10-CM | POA: Insufficient documentation

## 2024-02-01 DIAGNOSIS — I252 Old myocardial infarction: Secondary | ICD-10-CM

## 2024-02-01 DIAGNOSIS — Z3A34 34 weeks gestation of pregnancy: Secondary | ICD-10-CM | POA: Insufficient documentation

## 2024-02-01 DIAGNOSIS — O0990 Supervision of high risk pregnancy, unspecified, unspecified trimester: Secondary | ICD-10-CM

## 2024-02-01 DIAGNOSIS — O09523 Supervision of elderly multigravida, third trimester: Secondary | ICD-10-CM | POA: Diagnosis not present

## 2024-02-01 DIAGNOSIS — O10919 Unspecified pre-existing hypertension complicating pregnancy, unspecified trimester: Secondary | ICD-10-CM

## 2024-02-01 NOTE — Addendum Note (Signed)
 Addended by: Lenoard Rad A on: 02/01/2024 03:38 PM   Modules accepted: Orders

## 2024-02-01 NOTE — Patient Instructions (Signed)

## 2024-02-01 NOTE — Progress Notes (Addendum)
 PRENATAL VISIT NOTE  Subjective:  [name] is a 43 y.o. G2P0010 at [redacted]w[redacted]d being seen today for ongoing prenatal care.  She is currently monitored for the following issues for this high-risk pregnancy and has History of ST elevation myocardial infarction; Type 2 diabetes mellitus with obesity (HCC); Essential hypertension; History of gastric surgery; Supervision of high risk pregnancy, antepartum; Preexisting diabetes complicating pregnancy, antepartum; Preexisting hypertension complicating pregnancy, antepartum; AMA (advanced maternal age) 35+; Anxiety; Sickle cell trait (HCC); Maternal obesity affecting pregnancy, antepartum; Cardiac complication; BMI 40.0-44.9, adult (HCC); Hyperglycemia due to type 2 diabetes mellitus (HCC); Non-toxic multinodular goiter; Subclinical hyperthyroidism; and Coronary artery disease on their problem list.  Patient reports no complaints.  Contractions: Irritability. Vag. Bleeding: None.  Movement: Present. Denies leaking of fluid.   The following portions of the patient's history were reviewed and updated as appropriate: allergies, current medications, past family history, past medical history, past social history, past surgical history and problem list.   Objective:    Vitals:   02/01/24 1444  BP: 128/78  Pulse: 79  Weight: 271 lb (122.9 kg)    Fetal Status:  Fetal Heart Rate (bpm): 147   Movement: Present    General: Alert, oriented and cooperative. Patient is in no acute distress.  Skin: Skin is warm and dry. No rash noted.   Cardiovascular: Normal heart rate noted  Respiratory: Normal respiratory effort, no problems with respiration noted  Abdomen: Soft, gravid, appropriate for gestational age.  Pain/Pressure: Absent     Pelvic: Cervical exam deferred but cultures obtained in presence of chaperone       Extremities: Normal range of motion.  Edema: Trace  Mental Status: Normal mood and affect. Normal behavior. Normal judgment and thought  content.   US  MFM OB FOLLOW UP Result Date: 01/30/2024 ----------------------------------------------------------------------  OBSTETRICS REPORT                       (Signed Final 01/30/2024 01:44 pm) ---------------------------------------------------------------------- Patient Info  ID #:       [id number]                          D.O.B.:  08-Apr-1981 (43 yrs)(F)  Name:       [name]              Visit Date: 01/30/2024 09:20 am ---------------------------------------------------------------------- Performed By  Attending:        Penney Bowling DO       Ref. Address:     [address]                                                             [place], [place]                                                             [address]  Performed By:     Levonne Rear        Location[facility]         [facility]                                  [facility]  Referred By:      Julianne Octave MD ---------------------------------------------------------------------- Orders  #  Description                           Code        Ordered By  1  US  MFM OB FOLLOW UP                   76816.01    BURK SCHAIBLE  2  US  MFM FETAL BPP WO NON               76819.01    [facility]     STRESS ----------------------------------------------------------------------  #  Order #                     Accession #                Episode #  1  [id number]                   [id number]                 [id number]  2  [id number]                   [id number]                 [id number] ---------------------------------------------------------------------- Indications  Pre-existing diabetes, type 2, in pregnancy,   O24.113  third trimester (insulin )  Obesity complicating pregnancy, third          O99.212  trimester (BMI 41)  Advanced maternal age multigravida 34+,         O80.522  third trimester (18)  Medical complication of pregnancy (Gastric     O26.90  surgery)  Hypertension - Chronic/Pre-existing            O10.019  History of sickle cell trait                   Z86.2  Encounter for other antenatal screening        Z36.2  follow-up  [redacted] weeks gestation of pregnancy                Z3A.34  LR NIPS  Poor fetal growth, third trimester             O36.5930 ---------------------------------------------------------------------- Fetal Evaluation  Num Of Fetuses:  1  Fetal Heart Rate(bpm):  157  Cardiac Activity:       Observed  Presentation:           Cephalic  Placenta:               Anterior  P. Cord Insertion:      Previously seen  Amniotic Fluid  AFI FV:      Within normal limits  AFI Sum(cm)     %Tile       Largest Pocket(cm)  11.34           30          4.23  RUQ(cm)       RLQ(cm)       LUQ(cm)        LLQ(cm)  2.73          2.65          4.23           1.73 ---------------------------------------------------------------------- Biophysical Evaluation  Amniotic F.V:   Within normal limits       F. Tone:        Observed  F. Movement:    Observed                   N.S.T:          Reactive  F. Breathing:   Observed                   Score:          10/10 ---------------------------------------------------------------------- Biometry  BPD:      80.6  mm     G. Age:  32w 3d          5  %    CI:        73.98   %    70 - 86                                                          FL/HC:      20.1   %    19.4 - 21.8  HC:      297.6  mm     G. Age:  33w 0d        1.9  %    HC/AC:      0.98        0.96 - 1.11  AC:      303.8  mm     G. Age:  34w 2d         52  %    FL/BPD:     74.2   %    71 - 87  FL:       59.8  mm     G. Age:  31w 1d        < 1  %    FL/AC:      19.7   %    20 - 24  LV:        3.7  mm   RIGHT  TIB:      54.2  mm     G. Age:  32w 0d         14  %  FIB:      52.1  mm     G.  Age:  32w 0d         43  %   LEFT  TIB:      54.2  mm     G. Age:  32w 0d         14  %  FIB:       52.1  mm     G. Age:  32w 0d         43  %  Est. FW:    2122  gm    4 lb 11 oz      13  % ---------------------------------------------------------------------- OB History  Gravidity:    2          SAB:   1  Living:       0 ---------------------------------------------------------------------- Gestational Age  LMP:           35w 3d        Date:  05/27/23                   EDD:   03/02/24  U/S Today:     32w 5d                                        EDD:   03/21/24  Best:          34w 3d     Det. By:  U/S  (10/10/23)          EDD:   03/09/24 ---------------------------------------------------------------------- Anatomy  Cranium:               Previously seen        Aortic Arch:            Previously seen  Cavum:                 Previously seen        Ductal Arch:            Previously seen  Ventricles:            Appears normal         Diaphragm:              Appears normal  Choroid Plexus:        Previously seen        Stomach:                Appears normal, left                                                                        sided  Cerebellum:            Appears normal         Abdomen:                Previously seen  Posterior Fossa:       Appears normal         Abdominal Wall:         Previously seen  Face:                  Orbits and profile  Cord Vessels:           Previously seen                         previously seen  Lips:                  Previously seen        Kidneys:                Appear normal  Thoracic:              Previously seen        Bladder:                Appears normal  Heart:                 Appears normal         Spine:                  Previously seen                         (4CH, axis, and                         situs)  RVOT:                  Previously seen        Upper Extremities:      Previously seen  LVOT:                  Previously seen        Lower Extremities:      Previously seen  Other:  Female gender previously seen. Fetal anatomic survey complete on          prior  scans. ---------------------------------------------------------------------- Comments  Augustina D Zelenak is doing well today with no acute  concerns.  The patient is doing well today without concern.  Since  decreasing her amlodipine  her blood pressure is more normal  today.  She continues to take metoprolol .  Her cardiologist  reports that this is necessary to compensate for coronary  blood flow and avoid anginal symptoms. Her blood sugars  are well controlled.  I discussed the ultrasound findings of the NST which are both  normal and reassuring today.  We discussed the importance  of twice-weekly antenatal testing and delivery around 37  weeks.  Sonographic findings  Single intrauterine pregnancy.  Fetal cardiac activity: Observed.  Presentation: Cephalic.  Interval fetal anatomy appears normal.  Amniotic fluid: Within normal limits.  MVP: 4.23 cm.  Placenta: Anterior.  BPP: 10/10.  There are limitations of prenatal ultrasound such as the  inability to detect certain abnormalities due to poor  visualization. Various factors such as fetal position,  gestational age and maternal body habitus may increase the  difficulty in visualizing the fetal anatomy.  Recommendations  -Continue twice weekly testing.  -Delivery at 37 weeks or sooner if indicated. ----------------------------------------------------------------------                  Penney Bowling, DO Electronically Signed Final Report   01/30/2024 01:44 pm ----------------------------------------------------------------------   US  MFM FETAL BPP WO NON STRESS Result Date: 01/30/2024 ----------------------------------------------------------------------  OBSTETRICS REPORT                       (Signed Final 01/30/2024 01:44  pm) ---------------------------------------------------------------------- Patient Info  ID #:       [id number]                          D.O.B.:  [birth year] (42 yrs)(F)  Name:       [name]              Visit Date: 01/30/2024 09:20 am  ---------------------------------------------------------------------- Performed By  Attending:        Penney Bowling DO       Ref. Address:     [address]                                                             [place], [place]                                                             [address]  Performed By:     Levonne Rear        Location:         [facility]                                  [facility]                                                             [facility]  Referred By:      Julianne Octave MD ---------------------------------------------------------------------- Orders  #  Description                           Code        Ordered By  1  US  MFM OB FOLLOW UP                   76816.01    BURK SCHAIBLE  2  US  MFM FETAL BPP WO NON               69629.52    BURK SCHAIBLE     STRESS ----------------------------------------------------------------------  #  Order #  Accession #                Episode #  1  [id number]                   [id number]                 [id number]  2  [id number]                   [id number]                 [id number] ---------------------------------------------------------------------- Indications  Pre-existing diabetes, type 2, in pregnancy,   O24.113  third trimester (insulin )  Obesity complicating pregnancy, third          O99.212  trimester (BMI 41)  Advanced maternal age multigravida 26+,        O56.522  third trimester (43)  Medical complication of pregnancy (Gastric     O26.90  surgery)  Hypertension - Chronic/Pre-existing            O10.019  History of sickle cell trait                   Z86.2  Encounter for other antenatal screening        Z36.2  follow-up  [redacted] weeks gestation of pregnancy                Z3A.34  LR NIPS  Poor fetal growth, third trimester             O36.5930  ---------------------------------------------------------------------- Fetal Evaluation  Num Of Fetuses:         1  Fetal Heart Rate(bpm):  157  Cardiac Activity:       Observed  Presentation:           Cephalic  Placenta:               Anterior  P. Cord Insertion:      Previously seen  Amniotic Fluid  AFI FV:      Within normal limits  AFI Sum(cm)     %Tile       Largest Pocket(cm)  11.34           30          4.23  RUQ(cm)       RLQ(cm)       LUQ(cm)        LLQ(cm)  2.73          2.65          4.23           1.73 ---------------------------------------------------------------------- Biophysical Evaluation  Amniotic F.V:   Within normal limits       F. Tone:        Observed  F. Movement:    Observed                   N.S.T:          Reactive  F. Breathing:   Observed                   Score:          10/10 ---------------------------------------------------------------------- Biometry  BPD:      80.6  mm     G. Age:  32w 3d          5  %    CI:        73.98   %    70 -  86                                                          FL/HC:      20.1   %    19.4 - 21.8  HC:      297.6  mm     G. Age:  33w 0d        1.9  %    HC/AC:      0.98        0.96 - 1.11  AC:      303.8  mm     G. Age:  34w 2d         52  %    FL/BPD:     74.2   %    71 - 87  FL:       59.8  mm     G. Age:  31w 1d        < 1  %    FL/AC:      19.7   %    20 - 24  LV:        3.7  mm   RIGHT  TIB:      54.2  mm     G. Age:  32w 0d         14  %  FIB:      52.1  mm     G. Age:  32w 0d         43  %   LEFT  TIB:      54.2  mm     G. Age:  32w 0d         14  %  FIB:      52.1  mm     G. Age:  32w 0d         43  %  Est. FW:    2122  gm    4 lb 11 oz      13  % ---------------------------------------------------------------------- OB History  Gravidity:    2          SAB:   1  Living:       0 ---------------------------------------------------------------------- Gestational Age  LMP:           35w 3d        Date:  05/27/23                   EDD:   03/02/24   U/S Today:     32w 5d                                        EDD:   03/21/24  Best:          34w 3d     Det. By:  U/S  (10/10/23)          EDD:   03/09/24 ---------------------------------------------------------------------- Anatomy  Cranium:               Previously seen        Aortic Arch:            Previously seen  Cavum:  Previously seen        Ductal Arch:            Previously seen  Ventricles:            Appears normal         Diaphragm:              Appears normal  Choroid Plexus:        Previously seen        Stomach:                Appears normal, left                                                                        sided  Cerebellum:            Appears normal         Abdomen:                Previously seen  Posterior Fossa:       Appears normal         Abdominal Wall:         Previously seen  Face:                  Orbits and profile     Cord Vessels:           Previously seen                         previously seen  Lips:                  Previously seen        Kidneys:                Appear normal  Thoracic:              Previously seen        Bladder:                Appears normal  Heart:                 Appears normal         Spine:                  Previously seen                         (4CH, axis, and                         situs)  RVOT:                  Previously seen        Upper Extremities:      Previously seen  LVOT:                  Previously seen        Lower Extremities:      Previously seen  Other:  Female gender previously seen. Fetal anatomic survey complete on          prior scans. ---------------------------------------------------------------------- Comments  Nahomi  D Heritage is doing well today with no acute  concerns.  The patient is doing well today without concern.  Since  decreasing her amlodipine  her blood pressure is more normal  today.  She continues to take metoprolol .  Her cardiologist  reports that this is necessary to compensate for coronary  blood  flow and avoid anginal symptoms. Her blood sugars  are well controlled.  I discussed the ultrasound findings of the NST which are both  normal and reassuring today.  We discussed the importance  of twice-weekly antenatal testing and delivery around 37  weeks.  Sonographic findings  Single intrauterine pregnancy.  Fetal cardiac activity: Observed.  Presentation: Cephalic.  Interval fetal anatomy appears normal.  Amniotic fluid: Within normal limits.  MVP: 4.23 cm.  Placenta: Anterior.  BPP: 10/10.  There are limitations of prenatal ultrasound such as the  inability to detect certain abnormalities due to poor  visualization. Various factors such as fetal position,  gestational age and maternal body habitus may increase the  difficulty in visualizing the fetal anatomy.  Recommendations  -Continue twice weekly testing.  -Delivery at 37 weeks or sooner if indicated. ----------------------------------------------------------------------                  Penney Bowling, DO Electronically Signed Final Report   01/30/2024 01:44 pm ----------------------------------------------------------------------   US  MFM FETAL BPP W/NONSTRESS Result Date: 01/26/2024 ----------------------------------------------------------------------  OBSTETRICS REPORT                       (Signed Final 01/26/2024 02:23 pm) ---------------------------------------------------------------------- Patient Info  ID #:       [id number]                          D.O.B.:  08-06-1981 (42 yrs)(F)  Name:       [name]              Visit Date: 01/26/2024 09:57 am ---------------------------------------------------------------------- Performed By  Attending:        Penney Bowling DO       Ref. Address:     [address]                                                             [place], [place]                                                             [address]  Performed By:     Fredrick Jenkins          Location:         [facility]                    RD[facility]                                      [facility]  Referred By:      Julianne Octave MD ---------------------------------------------------------------------- Orders  #  Description                           Code        Ordered By  1  US  MFM FETAL BPP                      47829.5     Penney Bowling     W/NONSTRESS ----------------------------------------------------------------------  #  Order #                     Accession #                Episode #  1  [id number]                   [id number]                 [id number] ---------------------------------------------------------------------- Indications  Non-reactive NST                               O28.9  Pre-existing diabetes, type 2, in pregnancy,   O24.113  third trimester (insulin )  Obesity complicating pregnancy, third          O99.212  trimester (BMI 41)  Advanced maternal age multigravida 27+,        O51.522  third trimester (67)  Medical complication of pregnancy (Gastric     O26.90  surgery)  Hypertension - Chronic/Pre-existing            O10.019  History of sickle cell trait                   Z86.2  [redacted] weeks gestation of pregnancy                Z3A.33  Encounter for other antenatal screening        Z36.2  follow-up  LR NIPS ---------------------------------------------------------------------- Fetal Evaluation  Num Of Fetuses:         1  Fetal Heart Rate(bpm):  138  Cardiac Activity:       Observed  Presentation:           Transverse, head to maternal left  Placenta:               Anterior  P. Cord Insertion:      Previously seen  Amniotic Fluid  AFI FV:      Within normal limits  AFI Sum(cm)     %Tile       Largest Pocket(cm)  11.1            27          4.38  RUQ(cm)       RLQ(cm)       LUQ(cm)        LLQ(cm)  1.43  4.38          3.48           1.81  ---------------------------------------------------------------------- Biophysical Evaluation  Amniotic F.V:   Pocket => 2 cm             F. Tone:        Observed  F. Movement:    Observed                   N.S.T:          Equivocal  F. Breathing:   Observed                   Score:          8/10 ---------------------------------------------------------------------- OB History  Gravidity:    2          SAB:   1  Living:       0 ---------------------------------------------------------------------- Gestational Age  LMP:           34w 6d        Date:  05/27/23                   EDD:   03/02/24  Best:          33w 6d     Det. By:  U/S  (10/10/23)          EDD:   03/09/24 ---------------------------------------------------------------------- Anatomy  Diaphragm:             Appears normal         Kidneys:                Appear normal  Stomach:               Appears normal, left   Bladder:                Appears normal                         sided ---------------------------------------------------------------------- Cervix Uterus Adnexa  Cervix  Not visualized (advanced GA >24wks)  Uterus  Single fibroid noted, see table below. ---------------------------------------------------------------------- Myomas  Site                     L(cm)      W(cm)      D(cm)       Location  Anterior                 2.75       2.72       1.36 ----------------------------------------------------------------------  Blood Flow                  RI       PI       Comments ---------------------------------------------------------------------- Comments  Sakara D Alguire is doing well today with no acute  concerns.  The patient is doing well today without complaint.  She had  an NST that was equivocal.  It was reactive but there was a  variable  decelerations and the variability was minimal at  certain points (likely a sleep cycle).  A BPP was ordered  which was 8 out of 8 given her total score of 8 out of 10.  She  reports good fetal movement.   She reports that her blood  sugars are well-controlled.  Her blood pressure today was in  the low side 114/64.  She has an appoint  with her cardiologist  tomorrow.  I encouraged her to discuss discontinuing her  Norvasc  and checking her blood pressures at home.  I  discussed that the low blood pressure may potentially  contribute to placental insufficiency but everything looks  reassuring today.  Sonographic findings  Single intrauterine pregnancy.  Fetal cardiac activity: Observed.  Presentation: Transverse, head to maternal left.  Interval fetal anatomy appears normal.  Amniotic fluid: Within normal limits.  MVP: 4.38 cm.  Placenta: Anterior.  BPP: 8/10.  There are limitations of prenatal ultrasound such as the  inability to detect certain abnormalities due to poor  visualization. Various factors such as fetal position,  gestational age and maternal body habitus may increase the  difficulty in visualizing the fetal anatomy.  Recommendations  -Increase testing to 2x per week due to a number of  comorbidities  -Cardiology appt tomorrow to discuss d/c Norvasc .  -Delivery timing will likely be around 37 weeks but may be  earlier if indicated ----------------------------------------------------------------------                 Penney Bowling, DO Electronically Signed Final Report   01/26/2024 02:23 pm ----------------------------------------------------------------------   US  MFM FETAL BPP W/NONSTRESS Result Date: 01/16/2024 ----------------------------------------------------------------------  OBSTETRICS REPORT                    (Corrected Final 01/16/2024 03:30 pm) ---------------------------------------------------------------------- Patient Info  ID #:       [id number]                          D.O.B.:  Aug 15, 1981 (42 yrs)(F)  Name:       [name]              Visit Date: 01/16/2024 09:51 am ---------------------------------------------------------------------- Performed By  Attending:        Penney Bowling  DO       Ref. Address:     [address]                                                             [place], [place]                                                             [address]  Performed By:     Fredrick Jenkins          Location[facility]                    RD[facility]                                     [facility]  Referred By:      Julianne Octave MD ---------------------------------------------------------------------- Orders  #  Description                           Code        Ordered By  1  US  MFM FETAL BPP                      16109.6     Penney Bowling     W/NONSTRESS ----------------------------------------------------------------------  #  Order #                     Accession #                Episode #  1  [id number]                   [id number]                 [id number] ---------------------------------------------------------------------- Indications  Pre-existing diabetes, type 2, in pregnancy,   O24.113  third trimester (insulin )  Obesity complicating pregnancy, third          O99.212  trimester (BMI 41)  Advanced maternal age multigravida 90+,        O37.522  third trimester (37)  Medical complication of pregnancy (Gastric     O26.90  surgery)  Hypertension - Chronic/Pre-existing            O10.019  History of sickle cell trait                   Z86.2  [redacted] weeks gestation of pregnancy                Z3A.32  Encounter for other antenatal screening        Z36.2  follow-up  LR NIPS ---------------------------------------------------------------------- Fetal Evaluation  Num Of Fetuses:         1  Fetal Heart Rate(bpm):  142  Cardiac Activity:       Observed  Presentation:           Transverse, head to maternal left  Placenta:               Anterior  P. Cord Insertion:      Previously seen  Amniotic Fluid  AFI FV:      Within normal  limits  AFI Sum(cm)     %Tile       Largest Pocket(cm)  7.74            3.4         2.47  RUQ(cm)       RLQ(cm)       LUQ(cm)        LLQ(cm)  1.65          2.13          2.47           1.49 ---------------------------------------------------------------------- Biophysical Evaluation  Amniotic F.V:   Pocket => 2 cm             F. Tone:        Observed  F. Movement:    Observed                   N.S.T:          Equivocal  F. Breathing:   Observed                   Score:          8/10 ---------------------------------------------------------------------- OB History  Gravidity:    2          SAB:   1  Living:       0 ---------------------------------------------------------------------- Gestational Age  LMP:           33w 3d        Date:  05/27/23                   EDD:   03/02/24  Best:          Alfredia Annas 3d     Det. By:  U/S  (10/10/23)          EDD:   03/09/24 ---------------------------------------------------------------------- Comments  MFM Consult Note  Janee D Blanke is doing well today with no acute  concerns.  The patients blood glucose values are well controlled for the  most part. BP is at goal (117/89).  She is aware of the  importance of continuing to monitor her blood pressure and  blood sugar and report her glucose log to her doctors.  Sonographic findings  Single intrauterine pregnancy.  Fetal cardiac activity: Observed.  Presentation: Transverse, head to maternal left.  Interval fetal anatomy appears normal.  Amniotic fluid: Within normal limits.  MVP: 2.47 cm.  Placenta: Anterior.  BPP: 8/10 (equivocal NST).  There are limitations of prenatal ultrasound such as the  inability to detect certain abnormalities due to poor  visualization. Various factors such as fetal position,  gestational age and maternal body habitus may increase the  difficulty in visualizing the fetal anatomy.  Recommendations  -Serial growth ultrasounds every 4 weeks until delivery  -Antenatal testing to start around 32 weeks  -Delivery  around [redacted] weeks gestation ----------------------------------------------------------------------                       Penney Bowling, DO Electronically Signed Corrected Final Report  01/16/2024 03:30 pm ----------------------------------------------------------------------    Assessment and Plan:  Pregnancy: G2P0010 at [redacted]w[redacted]d 1. Preexisting diabetes complicating pregnancy, antepartum (Primary) Forgot log but reports fasting in 80-90s, PP mostly 110-120s.  Continue Lantus 16 units  in am and 12 units at night and Novolog  8-12 units qac.  Continue antenatal testing and scans as per MFM.  IOL scheduled at 37 weeks, patient chose the 02/19/24 date. Orders signed and held.  2. Preexisting hypertension complicating pregnancy, antepartum 3. History of ST elevation myocardial infarction Stable BP, on Amlodipine  2.5 mg and Toprol  XL 25 mg daily Followed by Cardiology. Already in antenatal testing, IOL at 37 weeks.  4. Multigravida of advanced maternal age in third trimester 5. [redacted] weeks gestation of pregnancy 6. Supervision of high risk pregnancy, antepartum Declined Tdap. No other issues. Preterm labor symptoms and general obstetric precautions including but not limited to vaginal bleeding, contractions, leaking of fluid and fetal movement were reviewed in detail with the patient. Please refer to After Visit Summary  for other counseling recommendations.  - Cervicovaginal ancillary only - Strep Gp B NAA  Return in about 2 weeks (around 02/15/2024) for OFFICE OB VISIT (MD only).  Future Appointments  Date Time Provider Department Center  02/02/2024  8:30 AM [facility] NURSE [facility]  02/02/2024  8:45 AM [facility] NST [facility]  02/07/2024  2:30 PM [facility] PROVIDER 1 [facility]  02/07/2024  3:15 PM [facility] NST [facility]  02/07/2024  3:30 PM [facility] US4 [facility]  02/10/2024  8:30 AM [facility] NURSE [facility]  02/10/2024  8:45 AM [facility] NST [facility]  02/10/2024  4:20 PM Sunny English, [facility]  [facility] None  02/14/2024  9:00 AM [facility] PROVIDER 1 [facility]  02/14/2024  9:30 AM [facility] US2 [facility]  02/14/2024 10:45 AM [facility] NST [facility]  02/15/2024  4:10 PM Granville Layer, MD [facility]  02/17/2024  8:45 AM [facility] NST [facility]  02/22/2024  4:10 PM Raynell Caller, MD [facility]  03/01/2024  4:10 PM Constant, Carles Cheadle, MD [facility]  03/02/2024  4:00 PM Jerryl Morin, DO [facility] None  03/07/2024  4:10 PM Raynell Caller, MD [facility]    Lenoard Rad, MD

## 2024-02-02 ENCOUNTER — Ambulatory Visit: Attending: Maternal & Fetal Medicine

## 2024-02-02 ENCOUNTER — Other Ambulatory Visit: Payer: Self-pay

## 2024-02-02 ENCOUNTER — Ambulatory Visit

## 2024-02-02 ENCOUNTER — Other Ambulatory Visit: Payer: Self-pay | Admitting: Maternal & Fetal Medicine

## 2024-02-02 VITALS — BP 115/62 | HR 85

## 2024-02-02 DIAGNOSIS — O09522 Supervision of elderly multigravida, second trimester: Secondary | ICD-10-CM

## 2024-02-02 DIAGNOSIS — E669 Obesity, unspecified: Secondary | ICD-10-CM

## 2024-02-02 DIAGNOSIS — Z3A34 34 weeks gestation of pregnancy: Secondary | ICD-10-CM | POA: Insufficient documentation

## 2024-02-02 DIAGNOSIS — O09523 Supervision of elderly multigravida, third trimester: Secondary | ICD-10-CM

## 2024-02-02 DIAGNOSIS — O99210 Obesity complicating pregnancy, unspecified trimester: Secondary | ICD-10-CM

## 2024-02-02 DIAGNOSIS — Z362 Encounter for other antenatal screening follow-up: Secondary | ICD-10-CM | POA: Diagnosis present

## 2024-02-02 DIAGNOSIS — O24319 Unspecified pre-existing diabetes mellitus in pregnancy, unspecified trimester: Secondary | ICD-10-CM

## 2024-02-02 DIAGNOSIS — O10913 Unspecified pre-existing hypertension complicating pregnancy, third trimester: Secondary | ICD-10-CM | POA: Diagnosis not present

## 2024-02-02 DIAGNOSIS — O10919 Unspecified pre-existing hypertension complicating pregnancy, unspecified trimester: Secondary | ICD-10-CM

## 2024-02-02 DIAGNOSIS — Z3A35 35 weeks gestation of pregnancy: Secondary | ICD-10-CM

## 2024-02-02 DIAGNOSIS — D573 Sickle-cell trait: Secondary | ICD-10-CM | POA: Insufficient documentation

## 2024-02-02 DIAGNOSIS — O24113 Pre-existing diabetes mellitus, type 2, in pregnancy, third trimester: Secondary | ICD-10-CM | POA: Insufficient documentation

## 2024-02-02 DIAGNOSIS — O10013 Pre-existing essential hypertension complicating pregnancy, third trimester: Secondary | ICD-10-CM

## 2024-02-02 DIAGNOSIS — Z794 Long term (current) use of insulin: Secondary | ICD-10-CM | POA: Diagnosis not present

## 2024-02-02 DIAGNOSIS — O365930 Maternal care for other known or suspected poor fetal growth, third trimester, not applicable or unspecified: Secondary | ICD-10-CM

## 2024-02-02 DIAGNOSIS — O99213 Obesity complicating pregnancy, third trimester: Secondary | ICD-10-CM | POA: Insufficient documentation

## 2024-02-02 DIAGNOSIS — O0990 Supervision of high risk pregnancy, unspecified, unspecified trimester: Secondary | ICD-10-CM

## 2024-02-02 NOTE — Procedures (Signed)
 [name] [birth year] [redacted]w[redacted]d  Fetus A Non-Stress Test Interpretation for 02/02/24  Indication: Chronic Hypertenstion, Diabetes, Advanced Maternal Age >40 years, and Obesity  Fetal Heart Rate A Mode: External Baseline Rate (A): 145 bpm Variability: Moderate Accelerations: 15 x 15 Decelerations: Variable Multiple birth?: No  Uterine Activity Mode: Palpation, Toco Contraction Frequency (min): none noted Resting Tone Palpated: Relaxed  Interpretation (Fetal Testing) Nonstress Test Interpretation: Reactive Comments: Reviewed strip with Dr. Nolan Battle. BPP added

## 2024-02-03 LAB — STREP GP B NAA: Strep Gp B NAA: NEGATIVE

## 2024-02-03 LAB — CERVICOVAGINAL ANCILLARY ONLY
Chlamydia: NEGATIVE
Comment: NEGATIVE
Comment: NORMAL
Neisseria Gonorrhea: NEGATIVE

## 2024-02-06 ENCOUNTER — Ambulatory Visit: Payer: Self-pay | Admitting: Obstetrics & Gynecology

## 2024-02-06 DIAGNOSIS — O0990 Supervision of high risk pregnancy, unspecified, unspecified trimester: Secondary | ICD-10-CM

## 2024-02-07 ENCOUNTER — Encounter: Payer: Self-pay | Admitting: *Deleted

## 2024-02-07 ENCOUNTER — Ambulatory Visit: Admitting: *Deleted

## 2024-02-07 ENCOUNTER — Ambulatory Visit: Attending: Obstetrics and Gynecology

## 2024-02-07 ENCOUNTER — Ambulatory Visit (HOSPITAL_BASED_OUTPATIENT_CLINIC_OR_DEPARTMENT_OTHER): Admitting: Obstetrics and Gynecology

## 2024-02-07 VITALS — BP 114/68 | HR 77

## 2024-02-07 DIAGNOSIS — E119 Type 2 diabetes mellitus without complications: Secondary | ICD-10-CM | POA: Diagnosis not present

## 2024-02-07 DIAGNOSIS — O09523 Supervision of elderly multigravida, third trimester: Secondary | ICD-10-CM | POA: Diagnosis present

## 2024-02-07 DIAGNOSIS — O365930 Maternal care for other known or suspected poor fetal growth, third trimester, not applicable or unspecified: Secondary | ICD-10-CM | POA: Diagnosis not present

## 2024-02-07 DIAGNOSIS — O99213 Obesity complicating pregnancy, third trimester: Secondary | ICD-10-CM

## 2024-02-07 DIAGNOSIS — O24113 Pre-existing diabetes mellitus, type 2, in pregnancy, third trimester: Secondary | ICD-10-CM

## 2024-02-07 DIAGNOSIS — O10913 Unspecified pre-existing hypertension complicating pregnancy, third trimester: Secondary | ICD-10-CM | POA: Diagnosis present

## 2024-02-07 DIAGNOSIS — Z3A35 35 weeks gestation of pregnancy: Secondary | ICD-10-CM

## 2024-02-07 DIAGNOSIS — E669 Obesity, unspecified: Secondary | ICD-10-CM

## 2024-02-07 DIAGNOSIS — O0990 Supervision of high risk pregnancy, unspecified, unspecified trimester: Secondary | ICD-10-CM | POA: Diagnosis present

## 2024-02-07 DIAGNOSIS — Z794 Long term (current) use of insulin: Secondary | ICD-10-CM

## 2024-02-07 DIAGNOSIS — O0993 Supervision of high risk pregnancy, unspecified, third trimester: Secondary | ICD-10-CM

## 2024-02-07 NOTE — Progress Notes (Signed)
 After review, MFM consult with provider is not indicated for today  Cassandria Clever, MD 02/07/2024 3:57 PM  [facility]

## 2024-02-07 NOTE — Procedures (Signed)
 [name] Feb 27, 1981 [redacted]w[redacted]d  Fetus A Non-Stress Test Interpretation for 02/07/24-NST with BPP  Indication: Diabetes and Advanced Maternal Age >40 years  Fetal Heart Rate A Mode: External Baseline Rate (A): 145 bpm Variability: Moderate Accelerations: 15 x 15 Decelerations: None Multiple birth?: No  Uterine Activity Mode: Toco Contraction Frequency (min): none Resting Tone Palpated: Relaxed  Interpretation (Fetal Testing) Nonstress Test Interpretation: Reactive Comments: Tracing reviewed by Dr. Arnie Bibber

## 2024-02-09 NOTE — Progress Notes (Signed)
 Patient ID: [name]                 DOB: 15-Jun-1981                      MRN: [id number]     HPI: [name] is a 43 y.o. female  (G2P0010, [redacted]w[redacted]d) referred by Dr. Emmette Harms to Cardio-OB HTN clinic. 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 She is scheduled for IOL on 02/19/24.   Patient was last seen by Dr. Emmette Harms on 01/27/24. Her BP in clinic was 114/70 mmHg. She expressed that MFM recommended discontinuing amlodipine  due to concern for placental insufficiency and equivocal NST. Because of her history of angina prior to pregnancy, Dr. Emmette Harms recommended to continue amlodipine  at a lower dose of 2.5 mg daily. Metoprolol  had already been reduced from 50 mg daily to 25 mg daily due to decline in fetal growth. At MFM follow-up on 01/30/24, BP was 126/60 mmHg. She was seen by an NP at [facility] on 02/06/24. Dexcom TIR documented as 98% (70-140 mg/dL). She was instructed to reduce Lantus dose by 50% postpartum and reduce Novolog  to 2-4 units prior to meals with sliding scale. Planning to follow-up for diabetes 3-4 weeks postpartum. BP at this visit was 129/76 mmHg.   Today patient presents in good spirits without assistance. She denies ShOB, chest pain. She is having 1+ bilateral pitting edema which started about a week ago. She denies dizziness, lightheadedness. She is monitoring her BP daily at home. She does not have any questions or concerns today. She states she will be working next week (through 02/17/24), prior to induction on 02/19/24. 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 Fetal NST this morning at MFM was reactive and WNL.   Current HTN meds: amlodipine  2.5 mg daily, metoprolol  succinate 25 mg daily Previously tried: metoprolol  50 mg daily (reduced dose with c/f  IUGR), amlodipine  5 mg (reduced dose with c/f IUGR) BP goal: <130/80 mmHg  ASCVD secondary prevention: aspirin  81 mg daily  Current DM meds: Lantus 14 units BID and Novolog  10-15 units TID before meals per sliding scale.   Family History: Sudden death MI in Father, HTN/HLD in Mother  Social History:  Works for [employer]  Diet:  Drinks: water throughout the day, Garment/textile technologist with lemonade packet  Exercise: Walking at work 3-4 days per week  Home BP readings: BF cuff has been validated. Reports the following BP ranges from memory: SBP 114-128 DBP 64-83  Homg CBG: Monitoring with Dexcom CGM - reports BG are within range. She is followed closely by another provider.  CARPEG II Score = 8 points, 41 % primary cardiac event risk. WHO Class II-III, high risk pregnancy requiring frequent cardiology follow up.  Wt Readings from Last 3 Encounters:  02/10/24 269 lb 14.4 oz (122.4 kg)  02/01/24 271 lb (122.9 kg)  01/27/24 265 lb 6.4 oz (120.4 kg)   BP Readings from Last 3 Encounters:  02/10/24 125/69  02/10/24 132/71  02/07/24 114/68   Pulse Readings from Last 3 Encounters:  02/10/24 73  02/10/24 79  02/07/24 77   Lab Results  Component Value Date   HGBA1C 5.6 12/22/2023  HGBA1C 6.8 (H) 08/22/2023   HGBA1C 6.2 (H) 10/19/2018       Latest Ref Rng & Units 12/22/2023    4:51 PM 08/22/2023    4:05 PM 03/21/2019    3:29 PM  BMP  Glucose 70 - 99 mg/dL 161  84  87   BUN 6 - 24 mg/dL 15  11  10    Creatinine 0.57 - 1.00 mg/dL 0.96  0.45  4.09   BUN/Creat Ratio 9 - 23 20  14  12    Sodium 134 - 144 mmol/L 137  134  139   Potassium 3.5 - 5.2 mmol/L 4.7  4.2  4.4   Chloride 96 - 106 mmol/L 104  100  103   CO2 20 - 29 mmol/L 17  16  17    Calcium  8.7 - 10.2 mg/dL 9.2  9.0  8.7     Renal function: CrCl cannot be calculated (Patient's most recent lab result is older than the maximum 21 days allowed.).  Past Medical History:  Diagnosis Date   Anxiety    Back  pain    Chest pain    Coronary artery disease    Coronary artery disease involving coronary bypass graft of native heart with angina pectoris (HCC) 08/02/2017   Diabetes mellitus    Difficulty swallowing pills    Fatigue    GERD (gastroesophageal reflux disease)    Gout    Hay fever    Headache    Heartburn    History of heart attack    History of varicella    HTN (hypertension)    Hyperlipidemia    Joint pain    Knee pain    Nervousness    Palpitations    Pure hypercholesterolemia with target low density lipoprotein (LDL) cholesterol less than 70 mg/dL 81/19/1478   Shortness of breath on exertion    Sickle cell trait (HCC) 09/03/2023   Vitamin D  deficiency 11/20/2018    Current Outpatient Medications on File Prior to Visit  Medication Sig Dispense Refill   acetaminophen  (TYLENOL ) 500 MG tablet Take 500 mg by mouth every 6 (six) hours as needed for mild pain (pain score 1-3) or headache.     amLODipine  (NORVASC ) 2.5 MG tablet Take 1 tablet (2.5 mg total) by mouth daily. 90 tablet 3   aspirin  EC 81 MG tablet Take 2 tablets (162 mg total) by mouth daily. 300 tablet 2   BD PEN NEEDLE NANO 2ND GEN 32G X 4 MM MISC See admin instructions.     cholecalciferol (VITAMIN D3) 25 MCG (1000 UNIT) tablet Take 1,000 Units by mouth daily.     Continuous Glucose Sensor (DEXCOM G7 SENSOR) MISC daily at 6 (six) AM.     insulin  aspart (NOVOLOG  FLEXPEN) 100 UNIT/ML FlexPen Inject 8-12 Units into the skin 3 (three) times daily with meals. Take 8-12 units before each meal depending on the sugar range     LANTUS SOLOSTAR 100 UNIT/ML Solostar Pen Inject 17 Units into the skin at bedtime. PATIENT USES 17 UNITS IN THE MORNING AND 12 UNITS AT NIGHT.     metoprolol  succinate (TOPROL -XL) 25 MG 24 hr tablet Take 1 tablet (25 mg total) by mouth daily. 30 tablet 3   polycarbophil (FIBERCON) 625 MG tablet Take 625 mg by mouth daily.     Prenatal 28-0.8 MG TABS Take 1 tablet by mouth daily.     Probiotic Product  (PROBIOTIC DAILY PO) Take by mouth.     vitamin B-12 (CYANOCOBALAMIN) 100  MCG tablet Take 100 mcg by mouth daily.     No current facility-administered medications on file prior to visit.    No Known Allergies   Assessment/Plan:  1. Hypertension - Currently controlled with home and clinic BP below goal < 130/80 mmHg given high CV risk with history of ASCVD. Patient is without s/sx of hypo- or hypertension. Appropriate to continue current regimen. Patient aware to reach out if she begins to experience BP at home > 130/80 mmHg. She has close post-partum follow-up scheduled with Cardio-OB clinic.  - Continue amlodipine  2.5 mg daily - Continue metoprolol  succinate 25 mg daily - Continue monitoring blood pressure at home once daily.  - Patient will be followed closely by MFM and OBGYN up to IOL  Follow-up: Dr. Emmette Harms (postpartum) 03/02/24   Arthea Larsson, PharmD PGY1 Pharmacy Resident  Seen with PGY1 resident and in agreement with plan  Joelene Murrain, PharmD, BCACP, CDCES, CPP [address], [address] [place], [place], [address] Phone: [phone], Fax: [phone]

## 2024-02-10 ENCOUNTER — Encounter: Payer: Self-pay | Admitting: Pharmacist

## 2024-02-10 ENCOUNTER — Ambulatory Visit

## 2024-02-10 ENCOUNTER — Ambulatory Visit: Attending: Maternal & Fetal Medicine

## 2024-02-10 ENCOUNTER — Ambulatory Visit (INDEPENDENT_AMBULATORY_CARE_PROVIDER_SITE_OTHER): Admitting: Pharmacist

## 2024-02-10 VITALS — BP 125/69 | HR 73 | Ht 64.0 in | Wt 269.9 lb

## 2024-02-10 VITALS — BP 132/71 | HR 79

## 2024-02-10 DIAGNOSIS — O24313 Unspecified pre-existing diabetes mellitus in pregnancy, third trimester: Secondary | ICD-10-CM | POA: Insufficient documentation

## 2024-02-10 DIAGNOSIS — Z3A36 36 weeks gestation of pregnancy: Secondary | ICD-10-CM | POA: Diagnosis not present

## 2024-02-10 DIAGNOSIS — O10013 Pre-existing essential hypertension complicating pregnancy, third trimester: Secondary | ICD-10-CM

## 2024-02-10 DIAGNOSIS — O24113 Pre-existing diabetes mellitus, type 2, in pregnancy, third trimester: Secondary | ICD-10-CM

## 2024-02-10 DIAGNOSIS — O10913 Unspecified pre-existing hypertension complicating pregnancy, third trimester: Secondary | ICD-10-CM | POA: Insufficient documentation

## 2024-02-10 DIAGNOSIS — O10919 Unspecified pre-existing hypertension complicating pregnancy, unspecified trimester: Secondary | ICD-10-CM

## 2024-02-10 DIAGNOSIS — O24319 Unspecified pre-existing diabetes mellitus in pregnancy, unspecified trimester: Secondary | ICD-10-CM

## 2024-02-10 DIAGNOSIS — O0990 Supervision of high risk pregnancy, unspecified, unspecified trimester: Secondary | ICD-10-CM

## 2024-02-10 NOTE — Procedures (Signed)
 Areya D Decaire 1981-02-29 [redacted]w[redacted]d  Fetus A Non-Stress Test Interpretation for 02/10/24  Indication: Chronic Hypertenstion, Diabetes, Advanced Maternal Age >40 years, and Obesity - NST only  Fetal Heart Rate A Mode: External Baseline Rate (A): 145 bpm Variability: Moderate Accelerations: 15 x 15 Decelerations: None Multiple birth?: No  Uterine Activity Mode: Palpation, Toco Contraction Frequency (min): none noted Resting Tone Palpated: Relaxed  Interpretation (Fetal Testing) Nonstress Test Interpretation: Reactive Comments: Reviewed with Dr. Nolan Battle

## 2024-02-14 ENCOUNTER — Ambulatory Visit (HOSPITAL_BASED_OUTPATIENT_CLINIC_OR_DEPARTMENT_OTHER)

## 2024-02-14 ENCOUNTER — Ambulatory Visit: Attending: Obstetrics and Gynecology | Admitting: Maternal & Fetal Medicine

## 2024-02-14 ENCOUNTER — Ambulatory Visit

## 2024-02-14 VITALS — BP 143/78

## 2024-02-14 DIAGNOSIS — O365930 Maternal care for other known or suspected poor fetal growth, third trimester, not applicable or unspecified: Secondary | ICD-10-CM

## 2024-02-14 DIAGNOSIS — D573 Sickle-cell trait: Secondary | ICD-10-CM | POA: Diagnosis not present

## 2024-02-14 DIAGNOSIS — O2690 Pregnancy related conditions, unspecified, unspecified trimester: Secondary | ICD-10-CM | POA: Insufficient documentation

## 2024-02-14 DIAGNOSIS — O99213 Obesity complicating pregnancy, third trimester: Secondary | ICD-10-CM

## 2024-02-14 DIAGNOSIS — E669 Obesity, unspecified: Secondary | ICD-10-CM

## 2024-02-14 DIAGNOSIS — O09523 Supervision of elderly multigravida, third trimester: Secondary | ICD-10-CM

## 2024-02-14 DIAGNOSIS — O24113 Pre-existing diabetes mellitus, type 2, in pregnancy, third trimester: Secondary | ICD-10-CM | POA: Insufficient documentation

## 2024-02-14 DIAGNOSIS — E1169 Type 2 diabetes mellitus with other specified complication: Secondary | ICD-10-CM

## 2024-02-14 DIAGNOSIS — O10913 Unspecified pre-existing hypertension complicating pregnancy, third trimester: Secondary | ICD-10-CM | POA: Insufficient documentation

## 2024-02-14 DIAGNOSIS — O24319 Unspecified pre-existing diabetes mellitus in pregnancy, unspecified trimester: Secondary | ICD-10-CM

## 2024-02-14 DIAGNOSIS — Z3A36 36 weeks gestation of pregnancy: Secondary | ICD-10-CM | POA: Diagnosis not present

## 2024-02-14 DIAGNOSIS — O99210 Obesity complicating pregnancy, unspecified trimester: Secondary | ICD-10-CM

## 2024-02-14 DIAGNOSIS — E119 Type 2 diabetes mellitus without complications: Secondary | ICD-10-CM

## 2024-02-14 DIAGNOSIS — Z862 Personal history of diseases of the blood and blood-forming organs and certain disorders involving the immune mechanism: Secondary | ICD-10-CM | POA: Insufficient documentation

## 2024-02-14 DIAGNOSIS — O0990 Supervision of high risk pregnancy, unspecified, unspecified trimester: Secondary | ICD-10-CM

## 2024-02-14 DIAGNOSIS — O10919 Unspecified pre-existing hypertension complicating pregnancy, unspecified trimester: Secondary | ICD-10-CM

## 2024-02-14 NOTE — Procedures (Signed)
 Cindy D Ebey Jun 04, 1981 [redacted]w[redacted]d  Fetus A Non-Stress Test Interpretation for 02/14/24  Indication: Chronic Hypertenstion, Diabetes, Advanced Maternal Age >40 years, and Obesity  Fetal Heart Rate A Mode: External Baseline Rate (A): 145 bpm Variability: Moderate Accelerations: 15 x 15 Decelerations: None Multiple birth?: No  Uterine Activity Mode: Palpation, Toco Contraction Frequency (min): none noted Resting Tone Palpated: Relaxed  Interpretation (Fetal Testing) Nonstress Test Interpretation: Reactive Comments: Reviewed with Dr. Arcola Kocher

## 2024-02-14 NOTE — Progress Notes (Signed)
 After review, MFM consult with provider is not indicated for today  Holly Petties, MD 02/14/2024 11:17 AM  [facility]

## 2024-02-15 ENCOUNTER — Ambulatory Visit: Admitting: Family Medicine

## 2024-02-15 VITALS — BP 125/84 | HR 72 | Wt 279.2 lb

## 2024-02-15 DIAGNOSIS — E1169 Type 2 diabetes mellitus with other specified complication: Secondary | ICD-10-CM

## 2024-02-15 DIAGNOSIS — E669 Obesity, unspecified: Secondary | ICD-10-CM

## 2024-02-15 DIAGNOSIS — O10919 Unspecified pre-existing hypertension complicating pregnancy, unspecified trimester: Secondary | ICD-10-CM | POA: Diagnosis not present

## 2024-02-15 DIAGNOSIS — I252 Old myocardial infarction: Secondary | ICD-10-CM | POA: Diagnosis not present

## 2024-02-15 DIAGNOSIS — O0990 Supervision of high risk pregnancy, unspecified, unspecified trimester: Secondary | ICD-10-CM

## 2024-02-15 DIAGNOSIS — O09523 Supervision of elderly multigravida, third trimester: Secondary | ICD-10-CM

## 2024-02-15 NOTE — Progress Notes (Signed)
 ROB: denies any concerns

## 2024-02-15 NOTE — Progress Notes (Signed)
    PRENATAL VISIT NOTE  Subjective:  [name] is a 43 y.o. G2P0010 at [redacted]w[redacted]d being seen today for ongoing prenatal care.  She is currently monitored for the following issues for this high-risk pregnancy and has History of ST elevation myocardial infarction; Type 2 diabetes mellitus with obesity (HCC); Essential hypertension; History of gastric surgery; Supervision of high risk pregnancy, antepartum; Preexisting diabetes complicating pregnancy, antepartum; Preexisting hypertension complicating pregnancy, antepartum; AMA (advanced maternal age) 66+; Anxiety; Sickle cell trait (HCC); Maternal obesity affecting pregnancy, antepartum; Cardiac complication; BMI 40.0-44.9, adult (HCC); Hyperglycemia due to type 2 diabetes mellitus (HCC); Non-toxic multinodular goiter; Subclinical hyperthyroidism; and Coronary artery disease on their problem list.  Patient reports no complaints.  Contractions: Not present.  .  Movement: Present. Denies leaking of fluid.   The following portions of the patient's history were reviewed and updated as appropriate: allergies, current medications, past family history, past medical history, past social history, past surgical history and problem list.   Objective:    Vitals:   02/15/24 1620 02/15/24 1621  BP: (!) 153/87 125/84  Pulse: 73 72  Weight: 279 lb 3.2 oz (126.6 kg)     Fetal Status:  Fetal Heart Rate (bpm): 147   Movement: Present    General: Alert, oriented and cooperative. Patient is in no acute distress.  Skin: Skin is warm and dry. No rash noted.   Cardiovascular: Normal heart rate noted  Respiratory: Normal respiratory effort, no problems with respiration noted  Abdomen: Soft, gravid, appropriate for gestational age.  Pain/Pressure: Absent     Pelvic: Cervical exam deferred        Extremities: Normal range of motion.     Mental Status: Normal mood and affect. Normal behavior. Normal judgment and thought content.   Assessment and Plan:   Pregnancy: G2P0010 at [redacted]w[redacted]d 1. Preexisting hypertension complicating pregnancy, antepartum (Primary) BP is creeping up. Re-check ok Likely related to decreasing BP meds but will check labs For IOL @ 37 weeks - CBC - Protein / creatinine ratio, urine - Comprehensive metabolic panel with GFR  2. Type 2 diabetes mellitus with obesity (HCC) Continue insulin  CBGs are in range In testing with MFM  3. Supervision of high risk pregnancy, antepartum   4. History of ST elevation myocardial infarction Seen by Cardio-OB and no signs of angina  5. Multigravida of advanced maternal age in third trimester LR NIPT  Preterm labor symptoms and general obstetric precautions including but not limited to vaginal bleeding, contractions, leaking of fluid and fetal movement were reviewed in detail with the patient. Please refer to After Visit Summary for other counseling recommendations.   Return in 2 weeks (on 02/29/2024).  Future Appointments  Date Time Provider Department Center  02/17/2024  8:30 AM [facility] NURSE [facility]  02/17/2024  8:45 AM [facility] NST [facility]  02/19/2024  6:30 AM [facility] SCHED ROOM [facility] None  03/02/2024  4:00 PM Tobb, Kardie, DO [facility] None    Granville Layer, MD

## 2024-02-16 LAB — COMPREHENSIVE METABOLIC PANEL WITH GFR
ALT: 13 IU/L (ref 0–32)
AST: 18 IU/L (ref 0–40)
Albumin: 3.2 g/dL — ABNORMAL LOW (ref 3.9–4.9)
Alkaline Phosphatase: 87 IU/L (ref 44–121)
BUN/Creatinine Ratio: 19 (ref 9–23)
BUN: 13 mg/dL (ref 6–24)
Bilirubin Total: 0.2 mg/dL (ref 0.0–1.2)
CO2: 18 mmol/L — ABNORMAL LOW (ref 20–29)
Calcium: 8.9 mg/dL (ref 8.7–10.2)
Chloride: 106 mmol/L (ref 96–106)
Creatinine, Ser: 0.7 mg/dL (ref 0.57–1.00)
Globulin, Total: 2.6 g/dL (ref 1.5–4.5)
Glucose: 60 mg/dL — ABNORMAL LOW (ref 70–99)
Potassium: 4.4 mmol/L (ref 3.5–5.2)
Sodium: 139 mmol/L (ref 134–144)
Total Protein: 5.8 g/dL — ABNORMAL LOW (ref 6.0–8.5)
eGFR: 110 mL/min/{1.73_m2} (ref >59)

## 2024-02-16 LAB — CBC
Hematocrit: 38 % (ref 34.0–46.6)
Hemoglobin: 12.3 g/dL (ref 11.1–15.9)
MCH: 26.6 pg (ref 26.6–33.0)
MCHC: 32.4 g/dL (ref 31.5–35.7)
MCV: 82 fL (ref 79–97)
Platelets: 299 10*3/uL (ref 150–450)
RBC: 4.63 x10E6/uL (ref 3.77–5.28)
RDW: 13.5 % (ref 11.7–15.4)
WBC: 9.7 10*3/uL (ref 3.4–10.8)

## 2024-02-16 LAB — PROTEIN / CREATININE RATIO, URINE
Creatinine, Urine: 37.1 mg/dL
Protein, Ur: 4.5 mg/dL
Protein/Creat Ratio: 121 mg/g{creat} (ref 0–200)

## 2024-02-17 ENCOUNTER — Telehealth (HOSPITAL_COMMUNITY): Payer: Self-pay | Admitting: *Deleted

## 2024-02-17 ENCOUNTER — Encounter (HOSPITAL_COMMUNITY): Payer: Self-pay | Admitting: *Deleted

## 2024-02-17 ENCOUNTER — Ambulatory Visit

## 2024-02-17 ENCOUNTER — Ambulatory Visit: Payer: Self-pay | Admitting: Family Medicine

## 2024-02-17 ENCOUNTER — Ambulatory Visit (HOSPITAL_BASED_OUTPATIENT_CLINIC_OR_DEPARTMENT_OTHER): Admitting: *Deleted

## 2024-02-17 ENCOUNTER — Ambulatory Visit: Admitting: *Deleted

## 2024-02-17 VITALS — BP 135/70 | HR 79

## 2024-02-17 DIAGNOSIS — E1169 Type 2 diabetes mellitus with other specified complication: Secondary | ICD-10-CM

## 2024-02-17 DIAGNOSIS — O09523 Supervision of elderly multigravida, third trimester: Secondary | ICD-10-CM | POA: Insufficient documentation

## 2024-02-17 DIAGNOSIS — Z3A37 37 weeks gestation of pregnancy: Secondary | ICD-10-CM

## 2024-02-17 DIAGNOSIS — O99210 Obesity complicating pregnancy, unspecified trimester: Secondary | ICD-10-CM

## 2024-02-17 DIAGNOSIS — O24113 Pre-existing diabetes mellitus, type 2, in pregnancy, third trimester: Secondary | ICD-10-CM | POA: Insufficient documentation

## 2024-02-17 DIAGNOSIS — O10013 Pre-existing essential hypertension complicating pregnancy, third trimester: Secondary | ICD-10-CM | POA: Insufficient documentation

## 2024-02-17 DIAGNOSIS — O99213 Obesity complicating pregnancy, third trimester: Secondary | ICD-10-CM | POA: Insufficient documentation

## 2024-02-17 DIAGNOSIS — O0990 Supervision of high risk pregnancy, unspecified, unspecified trimester: Secondary | ICD-10-CM

## 2024-02-17 DIAGNOSIS — O10913 Unspecified pre-existing hypertension complicating pregnancy, third trimester: Secondary | ICD-10-CM | POA: Diagnosis not present

## 2024-02-17 DIAGNOSIS — O10919 Unspecified pre-existing hypertension complicating pregnancy, unspecified trimester: Secondary | ICD-10-CM

## 2024-02-17 NOTE — Telephone Encounter (Signed)
 Preadmission screen

## 2024-02-17 NOTE — Procedures (Signed)
 Janin D Zuelke [birth year] [redacted]w[redacted]d  Fetus A Non-Stress Test Interpretation for 02/17/24-NST only  Indication: Chronic Hypertenstion, Diabetes, Advanced Maternal Age >40 years, and M.O.  Fetal Heart Rate A Mode: External Baseline Rate (A): 145 bpm Variability: Moderate Accelerations: 15 x 15 Decelerations: None Multiple birth?: No  Uterine Activity Mode: Toco Contraction Frequency (min): none Resting Tone Palpated: Relaxed  Interpretation (Fetal Testing) Nonstress Test Interpretation: Reactive Comments: Tracing reviewed by Dr. Arnie Bibber

## 2024-02-19 ENCOUNTER — Inpatient Hospital Stay (HOSPITAL_COMMUNITY)

## 2024-02-19 ENCOUNTER — Inpatient Hospital Stay (HOSPITAL_COMMUNITY)
Admission: RE | Admit: 2024-02-19 | Discharge: 2024-02-23 | DRG: 786 | Disposition: A | Discharge status: Home / Self Care | Attending: Family Medicine | Admitting: Family Medicine

## 2024-02-19 DIAGNOSIS — O9942 Diseases of the circulatory system complicating childbirth: Secondary | ICD-10-CM | POA: Diagnosis present

## 2024-02-19 DIAGNOSIS — O99214 Obesity complicating childbirth: Secondary | ICD-10-CM | POA: Diagnosis present

## 2024-02-19 DIAGNOSIS — O24013 Pre-existing diabetes mellitus, type 1, in pregnancy, third trimester: Secondary | ICD-10-CM | POA: Diagnosis not present

## 2024-02-19 DIAGNOSIS — E119 Type 2 diabetes mellitus without complications: Secondary | ICD-10-CM | POA: Diagnosis present

## 2024-02-19 DIAGNOSIS — O99844 Bariatric surgery status complicating childbirth: Secondary | ICD-10-CM | POA: Diagnosis present

## 2024-02-19 DIAGNOSIS — Z794 Long term (current) use of insulin: Secondary | ICD-10-CM

## 2024-02-19 DIAGNOSIS — D509 Iron deficiency anemia, unspecified: Secondary | ICD-10-CM | POA: Diagnosis present

## 2024-02-19 DIAGNOSIS — Z7982 Long term (current) use of aspirin: Secondary | ICD-10-CM

## 2024-02-19 DIAGNOSIS — Z833 Family history of diabetes mellitus: Secondary | ICD-10-CM | POA: Diagnosis not present

## 2024-02-19 DIAGNOSIS — Z8249 Family history of ischemic heart disease and other diseases of the circulatory system: Secondary | ICD-10-CM

## 2024-02-19 DIAGNOSIS — O10913 Unspecified pre-existing hypertension complicating pregnancy, third trimester: Principal | ICD-10-CM | POA: Diagnosis present

## 2024-02-19 DIAGNOSIS — O9962 Diseases of the digestive system complicating childbirth: Secondary | ICD-10-CM | POA: Diagnosis present

## 2024-02-19 DIAGNOSIS — O365930 Maternal care for other known or suspected poor fetal growth, third trimester, not applicable or unspecified: Secondary | ICD-10-CM | POA: Diagnosis present

## 2024-02-19 DIAGNOSIS — I10 Essential (primary) hypertension: Secondary | ICD-10-CM | POA: Diagnosis present

## 2024-02-19 DIAGNOSIS — I252 Old myocardial infarction: Secondary | ICD-10-CM

## 2024-02-19 DIAGNOSIS — O2412 Pre-existing diabetes mellitus, type 2, in childbirth: Principal | ICD-10-CM | POA: Diagnosis present

## 2024-02-19 DIAGNOSIS — O09523 Supervision of elderly multigravida, third trimester: Secondary | ICD-10-CM | POA: Diagnosis not present

## 2024-02-19 DIAGNOSIS — E038 Other specified hypothyroidism: Secondary | ICD-10-CM | POA: Diagnosis present

## 2024-02-19 DIAGNOSIS — I2510 Atherosclerotic heart disease of native coronary artery without angina pectoris: Secondary | ICD-10-CM | POA: Diagnosis present

## 2024-02-19 DIAGNOSIS — D573 Sickle-cell trait: Secondary | ICD-10-CM | POA: Diagnosis present

## 2024-02-19 DIAGNOSIS — Z3A37 37 weeks gestation of pregnancy: Secondary | ICD-10-CM | POA: Diagnosis not present

## 2024-02-19 DIAGNOSIS — O99284 Endocrine, nutritional and metabolic diseases complicating childbirth: Secondary | ICD-10-CM | POA: Diagnosis present

## 2024-02-19 DIAGNOSIS — K219 Gastro-esophageal reflux disease without esophagitis: Secondary | ICD-10-CM | POA: Diagnosis present

## 2024-02-19 DIAGNOSIS — O1092 Unspecified pre-existing hypertension complicating childbirth: Secondary | ICD-10-CM | POA: Diagnosis present

## 2024-02-19 DIAGNOSIS — E66813 Obesity, class 3: Secondary | ICD-10-CM | POA: Diagnosis present

## 2024-02-19 DIAGNOSIS — O0990 Supervision of high risk pregnancy, unspecified, unspecified trimester: Secondary | ICD-10-CM

## 2024-02-19 DIAGNOSIS — O1002 Pre-existing essential hypertension complicating childbirth: Secondary | ICD-10-CM | POA: Diagnosis not present

## 2024-02-19 DIAGNOSIS — Z79899 Other long term (current) drug therapy: Secondary | ICD-10-CM

## 2024-02-19 DIAGNOSIS — O24319 Unspecified pre-existing diabetes mellitus in pregnancy, unspecified trimester: Secondary | ICD-10-CM | POA: Diagnosis present

## 2024-02-19 DIAGNOSIS — O9902 Anemia complicating childbirth: Secondary | ICD-10-CM | POA: Diagnosis present

## 2024-02-19 DIAGNOSIS — O09529 Supervision of elderly multigravida, unspecified trimester: Secondary | ICD-10-CM

## 2024-02-19 DIAGNOSIS — E1165 Type 2 diabetes mellitus with hyperglycemia: Secondary | ICD-10-CM | POA: Diagnosis present

## 2024-02-19 DIAGNOSIS — O99210 Obesity complicating pregnancy, unspecified trimester: Secondary | ICD-10-CM | POA: Diagnosis present

## 2024-02-19 DIAGNOSIS — O10919 Unspecified pre-existing hypertension complicating pregnancy, unspecified trimester: Secondary | ICD-10-CM | POA: Diagnosis present

## 2024-02-19 LAB — COMPREHENSIVE METABOLIC PANEL WITH GFR
ALT: 16 U/L (ref 0–44)
AST: 20 U/L (ref 15–41)
Albumin: 2.5 g/dL — ABNORMAL LOW (ref 3.5–5.0)
Alkaline Phosphatase: 63 U/L (ref 38–126)
Anion gap: 8 (ref 5–15)
BUN: 10 mg/dL (ref 6–20)
CO2: 20 mmol/L — ABNORMAL LOW (ref 22–32)
Calcium: 8.5 mg/dL — ABNORMAL LOW (ref 8.9–10.3)
Chloride: 109 mmol/L (ref 98–111)
Creatinine, Ser: 0.81 mg/dL (ref 0.44–1.00)
GFR, Estimated: >60 mL/min (ref >60)
Glucose, Bld: 78 mg/dL (ref 70–99)
Potassium: 3.7 mmol/L (ref 3.5–5.1)
Sodium: 137 mmol/L (ref 135–145)
Total Bilirubin: 0.8 mg/dL (ref 0.0–1.2)
Total Protein: 6.2 g/dL — ABNORMAL LOW (ref 6.5–8.1)

## 2024-02-19 LAB — TYPE AND SCREEN
ABO/RH(D): AB POS
Antibody Screen: NEGATIVE

## 2024-02-19 LAB — CBC
HCT: 39.8 % (ref 36.0–46.0)
Hemoglobin: 13.3 g/dL (ref 12.0–15.0)
MCH: 26.5 pg (ref 26.0–34.0)
MCHC: 33.4 g/dL (ref 30.0–36.0)
MCV: 79.3 fL — ABNORMAL LOW (ref 80.0–100.0)
Platelets: 291 10*3/uL (ref 150–400)
RBC: 5.02 MIL/uL (ref 3.87–5.11)
RDW: 15 % (ref 11.5–15.5)
WBC: 9.4 10*3/uL (ref 4.0–10.5)
nRBC: 0 % (ref 0.0–0.2)

## 2024-02-19 LAB — GLUCOSE, CAPILLARY: Glucose-Capillary: 67 mg/dL — ABNORMAL LOW (ref 70–99)

## 2024-02-19 MED ORDER — INSULIN GLARGINE-YFGN 100 UNIT/ML ~~LOC~~ SOLN
13.0000 [IU] | Freq: Every day | SUBCUTANEOUS | Status: DC
  Filled 2024-02-19: qty 0.13

## 2024-02-19 MED ORDER — LACTATED RINGERS IV SOLN
INTRAVENOUS | Status: AC
  Administered 2024-02-19: 125 mL via INTRAVENOUS

## 2024-02-19 MED ORDER — INSULIN ASPART 100 UNIT/ML IJ SOLN
0.0000 [IU] | INTRAMUSCULAR | Status: DC
  Administered 2024-02-19: 3 [IU] via SUBCUTANEOUS
  Administered 2024-02-20 (×2): 1 [IU] via SUBCUTANEOUS

## 2024-02-19 MED ORDER — INSULIN GLARGINE-YFGN 100 UNIT/ML ~~LOC~~ SOLN: 12.0000 [IU] | Freq: Two times a day (BID) | SUBCUTANEOUS | Status: DC

## 2024-02-19 MED ORDER — TERBUTALINE SULFATE 1 MG/ML IJ SOLN
0.2500 mg | Freq: Once | INTRAMUSCULAR | Status: DC | PRN
Start: 2024-02-19 — End: 2024-02-21

## 2024-02-19 MED ORDER — SOD CITRATE-CITRIC ACID 500-334 MG/5ML PO SOLN: 30.0000 mL | ORAL | Status: DC | PRN

## 2024-02-19 MED ORDER — METOPROLOL SUCCINATE ER 25 MG PO TB24
25.0000 mg | ORAL_TABLET | Freq: Every day | ORAL | Status: DC
  Administered 2024-02-19 – 2024-02-23 (×5): 25 mg via ORAL
  Filled 2024-02-19 (×5): qty 1

## 2024-02-19 MED ORDER — FLEET ENEMA RE ENEM: 1.0000 | ENEMA | Freq: Every day | RECTAL | Status: DC | PRN

## 2024-02-19 MED ORDER — ZOLPIDEM TARTRATE 5 MG PO TABS: 5.0000 mg | ORAL_TABLET | Freq: Every evening | ORAL | Status: DC | PRN

## 2024-02-19 MED ORDER — OXYTOCIN BOLUS FROM INFUSION: 333.0000 mL | Freq: Once | INTRAVENOUS | Status: DC

## 2024-02-19 MED ORDER — OXYCODONE-ACETAMINOPHEN 5-325 MG PO TABS: 2.0000 | ORAL_TABLET | ORAL | Status: DC | PRN

## 2024-02-19 MED ORDER — FENTANYL CITRATE (PF) 100 MCG/2ML IJ SOLN: 50.0000 ug | INTRAMUSCULAR | Status: DC | PRN

## 2024-02-19 MED ORDER — HYDROXYZINE HCL 50 MG PO TABS
50.0000 mg | ORAL_TABLET | Freq: Four times a day (QID) | ORAL | Status: DC | PRN
Start: 2024-02-19 — End: 2024-02-21

## 2024-02-19 MED ORDER — LIDOCAINE HCL (PF) 1 % IJ SOLN
30.0000 mL | INTRAMUSCULAR | Status: DC | PRN
Start: 2024-02-19 — End: 2024-02-21

## 2024-02-19 MED ORDER — LACTATED RINGERS IV SOLN: 500.0000 mL | INTRAVENOUS | Status: AC | PRN

## 2024-02-19 MED ORDER — INSULIN GLARGINE-YFGN 100 UNIT/ML ~~LOC~~ SOLN
10.0000 [IU] | Freq: Every day | SUBCUTANEOUS | Status: DC
  Filled 2024-02-19: qty 0.1

## 2024-02-19 MED ORDER — OXYCODONE-ACETAMINOPHEN 5-325 MG PO TABS: 1.0000 | ORAL_TABLET | ORAL | Status: DC | PRN

## 2024-02-19 MED ORDER — OXYTOCIN-SODIUM CHLORIDE 30-0.9 UT/500ML-% IV SOLN
1.0000 m[IU]/min | INTRAVENOUS | Status: DC
  Administered 2024-02-19: 1 m[IU]/min via INTRAVENOUS
  Administered 2024-02-19: 3 m[IU]/min via INTRAVENOUS
  Administered 2024-02-20: 1 m[IU]/min via INTRAVENOUS
  Filled 2024-02-19: qty 500

## 2024-02-19 MED ORDER — MISOPROSTOL 25 MCG QUARTER TABLET
25.0000 ug | ORAL_TABLET | Freq: Once | ORAL | Status: AC
  Administered 2024-02-19: 25 ug via VAGINAL
  Filled 2024-02-19: qty 1

## 2024-02-19 MED ORDER — OXYTOCIN-SODIUM CHLORIDE 30-0.9 UT/500ML-% IV SOLN
1.0000 m[IU]/min | INTRAVENOUS | Status: DC
  Filled 2024-02-19: qty 500

## 2024-02-19 MED ORDER — AMLODIPINE BESYLATE 5 MG PO TABS
2.5000 mg | ORAL_TABLET | Freq: Every day | ORAL | Status: DC
  Administered 2024-02-19 – 2024-02-22 (×4): 2.5 mg via ORAL
  Filled 2024-02-19 (×4): qty 1

## 2024-02-19 MED ORDER — ACETAMINOPHEN 325 MG PO TABS
650.0000 mg | ORAL_TABLET | ORAL | Status: DC | PRN
Start: 2024-02-19 — End: 2024-02-21

## 2024-02-19 MED ORDER — INSULIN ASPART 100 UNIT/ML FLEXPEN: 8.0000 [IU] | PEN_INJECTOR | Freq: Three times a day (TID) | SUBCUTANEOUS | Status: DC

## 2024-02-19 MED ORDER — ONDANSETRON HCL 4 MG/2ML IJ SOLN: 4.0000 mg | Freq: Four times a day (QID) | INTRAMUSCULAR | Status: DC | PRN

## 2024-02-19 MED ORDER — MISOPROSTOL 50MCG HALF TABLET
50.0000 ug | ORAL_TABLET | Freq: Once | ORAL | Status: AC
Start: 2024-02-19 — End: 2024-02-19
  Administered 2024-02-19: 50 ug via ORAL
  Filled 2024-02-19: qty 1

## 2024-02-19 MED ORDER — OXYTOCIN-SODIUM CHLORIDE 30-0.9 UT/500ML-% IV SOLN: 2.5000 [IU]/h | INTRAVENOUS | Status: DC

## 2024-02-19 NOTE — Progress Notes (Signed)
 Taitum D Sweezy is a 43 y.o. G2P0010 at [redacted]w[redacted]d.  Subjective: Mild contractions   Objective: BP (!) 142/76   Pulse 73   Temp 98.8 F (37.1 C) (Oral)   Resp 18   Ht 5\' 4"  (1.626 m)   Wt 123.3 kg   LMP 05/27/2023 (Exact Date)   BMI 46.65 kg/m    FHT:  FHR: 150 bpm, variability: min-mod,  accelerations:  10x10,  decelerations:  variables, prolonged decel x 1 UC:   Q 3-5 minutes, mild Dilation: Closed Effacement (%): Thick Station: -3 Presentation: Vertex Exam by:: Jacquie Maudlin RN  Labs: Results for orders placed or performed during the hospital encounter of 02/19/24 (from the past 24 hours)  CBC     Status: Abnormal   Collection Time: 02/19/24  8:12 AM  Result Value Ref Range   WBC 9.4 4.0 - 10.5 K/uL   RBC 5.02 3.87 - 5.11 MIL/uL   Hemoglobin 13.3 12.0 - 15.0 g/dL   HCT 16.1 09.6 - 04.5 %   MCV 79.3 (L) 80.0 - 100.0 fL   MCH 26.5 26.0 - 34.0 pg   MCHC 33.4 30.0 - 36.0 g/dL   RDW 40.9 81.1 - 91.4 %   Platelets 291 150 - 400 K/uL   nRBC 0.0 0.0 - 0.2 %  Type and screen     Status: None   Collection Time: 02/19/24  8:12 AM  Result Value Ref Range   ABO/RH(D) AB POS    Antibody Screen NEG    Sample Expiration      02/22/2024,2359 Performed at [facility], [address], [place], [place] [address]   Comprehensive metabolic panel     Status: Abnormal   Collection Time: 02/19/24  8:12 AM  Result Value Ref Range   Sodium 137 135 - 145 mmol/L   Potassium 3.7 3.5 - 5.1 mmol/L   Chloride 109 98 - 111 mmol/L   CO2 20 (L) 22 - 32 mmol/L   Glucose, Bld 78 70 - 99 mg/dL   BUN 10 6 - 20 mg/dL   Creatinine, Ser 6.21 0.44 - 1.00 mg/dL   Calcium  8.5 (L) 8.9 - 10.3 mg/dL   Total Protein 6.2 (L) 6.5 - 8.1 g/dL   Albumin  2.5 (L) 3.5 - 5.0 g/dL   AST 20 15 - 41 U/L   ALT 16 0 - 44 U/L   Alkaline Phosphatase 63 38 - 126 U/L   Total Bilirubin 0.8 0.0 - 1.2 mg/dL   GFR, Estimated >30 >86 mL/min   Anion gap 8 5 - 15  Glucose, capillary     Status: Abnormal    Collection Time: 02/19/24  9:55 AM  Result Value Ref Range   Glucose-Capillary 67 (L) 70 - 99 mg/dL    Assessment / Plan: [redacted]w[redacted]d week IUP ROM x NA Labor: Early, cytotec given Fetal Wellbeing:  Category II, overall reassuring w/ periods of moderate variablility, accels present Pain Control:  Comfort measures  Anticipated MOD:  Uncertain due to Cat II tracing with closed cervix. Will CTO closely. Will update attending when out of OR or earlier PRN.  CHTN: Few mildy elevated BP. Continue home BP meds. Plan Lasix  PP and may need to adjust antihypertensives T2DM: CBGs Nml on Lantus. Has not needed SSI. Plan to switch to Endotool in active labor.  CAD. Hx STEMI and CABG: F/U OB Cards PP. Anesthesia consult IP.   [name], Haili Donofrio , CNM 02/19/2024 2:40 PM

## 2024-02-19 NOTE — Anesthesia Preprocedure Evaluation (Addendum)
 Anesthesia Evaluation  Patient identified by MRN, date of birth, ID band Patient awake    Reviewed: Allergy & Precautions, Patient's Chart, lab work & pertinent test results  Airway Mallampati: II  TM Distance: >3 FB Neck ROM: Full    Dental no notable dental hx. (+) Teeth Intact, Dental Advisory Given   Pulmonary neg pulmonary ROS   Pulmonary exam normal breath sounds clear to auscultation       Cardiovascular hypertension, Pt. on medications + angina  + CAD, + Past MI, + Cardiac Stents and + CABG  Normal cardiovascular exam Rhythm:Regular Rate:Normal  S/P CABG (LIMA to LAD, left radial to diagonal, SVG to sequential to PDA and PLb this was done in November 2018, hypertension, hyperlipidemia.     Echo 06/20/23  1. Left ventricular ejection fraction, by estimation, is 50 to 55%. The  left ventricle has low normal function. The left ventricle demonstrates  regional wall motion abnormalities with inferoseptal and basal inferior  hypokinesis. Left ventricular  diastolic parameters were normal. The average left ventricular global  longitudinal strain is -17.3 %. The global longitudinal strain is  abnormal.   2. Right ventricular systolic function is mildly reduced. The right  ventricular size is normal. There is normal pulmonary artery systolic  pressure. The estimated right ventricular systolic pressure is 18.5 mmHg.   3. The mitral valve is normal in structure. No evidence of mitral valve  regurgitation. No evidence of mitral stenosis.   4. The aortic valve is tricuspid. Aortic valve regurgitation is not  visualized. No aortic stenosis is present.   5. The inferior vena cava is normal in size with greater than 50%  respiratory variability, suggesting right atrial pressure of 3 mmHg.   EKG 01/27/24 Normal sinus rhythm T wave abnormality, consider inferior ischemia   Cardiac Cath 08/08/17  Acute inferior ST elevation  myocardial infarction  Total occlusion of the mid right coronary.  100% stenosis reduced to 0% at the angioplasty site following stenting with a 3.0 x 20 Promus Premier postdilated to 3.25 mm in diameter reestablishing TIMI grade III flow.  Severe distal RCA/PDA/RCA continuation Medina 111 bifurcation stenosis was revealed after recanalization of the mid occlusion.  Severe proximal LAD, 95%, 80-90% mid LAD, and 70% first diagonal stenosis.  90% proximal to mid circumflex stenosis.  Inferior wall hypokinesis.  EF 55%.  Acute diastolic heart failure with EDP 20 mmHg.    Neuro/Psych  Headaches  Anxiety        GI/Hepatic Neg liver ROS,GERD  Medicated,,Hx/o gastric bypass   Endo/Other  diabetes, Well Controlled, Type 1, Insulin  Dependent Hyperthyroidism Class 3 obesityHyperlipidemia  Renal/GU negative Renal ROS  negative genitourinary   Musculoskeletal negative musculoskeletal ROS (+)    Abdominal  (+) + obese  Peds  Hematology  (+) Blood dyscrasia, Sickle cell trait   Anesthesia Other Findings   Reproductive/Obstetrics (+) Pregnancy AMA                             Anesthesia Physical Anesthesia Plan  ASA: 3  Anesthesia Plan: Epidural   Post-op Pain Management: Minimal or no pain anticipated   Induction: Intravenous  PONV Risk Score and Plan:   Airway Management Planned: Natural Airway  Additional Equipment: Fetal Monitoring and None  Intra-op Plan:   Post-operative Plan:   Informed Consent: I have reviewed the patients History and Physical, chart, labs and discussed the procedure including the risks, benefits and alternatives for  the proposed anesthesia with the patient or authorized representative who has indicated his/her understanding and acceptance.       Plan Discussed with: Anesthesiologist  Anesthesia Plan Comments:         Anesthesia Quick Evaluation

## 2024-02-19 NOTE — Progress Notes (Signed)
 OB Note Foley bulb and then cook catheter attempt with large speculum, with sterile technique, attempted but uterine balloon kept coming out of the cervical canal.   Will restart the pit at 3 and max a 10 and reassess at the earliest in 4 hours  Tyler Gallant MD Attending Center for [facility] (Faculty Practice) 02/19/2024 Time: 2258

## 2024-02-19 NOTE — Progress Notes (Signed)
 OB Note EFM improved and baby okay for FB trial: q32m UCs, 145 baseline, 10x10 accels, no decels, min to mod variability. Pt and husband decline at this time. Will keep pitocin going and leave at 3mU for now.  Tyler Gallant MD Attending Center for [employer] (Faculty Practice) 02/19/2024 Time: 2145

## 2024-02-19 NOTE — H&P (Cosign Needed Addendum)
 OBSTETRIC ADMISSION HISTORY AND PHYSICAL  [name] is a 43 y.o. female G2P0010 with IUP at [redacted]w[redacted]d by US  @ [redacted]w[redacted]d presenting for IOL due to type 2 DM and chronic htn. She reports +FMs, No LOF, no VB, no blurry vision, headaches or peripheral edema, and RUQ pain.  She plans on breastfeeding. She is considering IUD. She received her prenatal care at [facility]   Followed by Dr. Emmette Harms for heart issues.   "Medical history includes diabetes mellitus type 2, obesity she is status post lap band in 2009, coronary artery disease status post inferior wall MI at the age of 34, status post CABG (LIMA to LAD, left radial to diagonal, SVG to sequential to PDA and PLb this was done in November 2018, hypertension, hyperlipidemia.  CARPEG II Score = 8 points, 41 % primary cardiac event risk. WHO Class II-III, high risk pregnancy requiring frequent cardiology follow up.   Currently using her beta-blocker as well as her calcium  channel blocker amlodipine  for antianginals.  Will cut this down to amlodipine  2.5 mg daily and keep her metoprolol  the same.  Will continue to monitor her blood pressure. Discussed with her OB team if we take away these two antianginal she it is a very high probability she will not compensate for coronary flow well and get symptomatic. Not using These ( Amlodpine and Toprol  Xl) for blood pressure in this case but as antianginal"  Echo 06/20/23: Left ventricular ejection fraction, by estimation, is 50 to 55% . The left ventricle has low normal function. The left ventricle demonstrates regional wall motion abnormalities with inferoseptal and basal inferior hypokinesis. Left ventricular diastolic parameters were normal. The average left ventricular global longitudinal strain is - 17. 3 % . The global longitudinal strain is abnormal. 2. Right ventricular systolic function is mildly reduced. The right ventricular size is normal. There is normal pulmonary artery systolic pressure. The  estimated right ventricular systolic pressure is 18. 5 mmHg. 3. The mitral valve is normal in structure. No evidence of mitral valve regurgitation. No evidence of mitral stenosis. 4. The aortic valve is tricuspid. Aortic valve regurgitation is not visualized. No aortic stenosis is present. 5. The inferior vena cava is normal in size with greater than 50% respiratory variability, suggesting right atrial pressure of 3 mmHg.  Dating: By US  @ [redacted]w[redacted]d --->  Estimated Date of Delivery: 03/09/24  Sono:  @[redacted]w[redacted]d , CWD, normal anatomy, anterior placenta, cephalic presentation, 2122 gm 4lb11 oz 13 %     NURSING  PROVIDER  Conservator, museum/gallery for Women> [place] Dating by US   [facility] Model Traditional Anatomy U/S Normal  Initiated care at  [employer]  English              LAB RESULTS   Support Person Husband/FOB--[employer] NIPS: LR F AFP:     Carrier Screen Horizon: Sickle cell trait- partner neg  Rhogam  AB/Positive/-- (12/02 1605) A1Cs 12w: 6.8  Flu Vaccine     TDaP Vaccine  Considering Blood Type AB/Positive/-- (12/02 1605)  Covid Vaccine  Antibody Negative (12/02 1605)  RSV Vaccine  Rubella 6.04 (12/02 1605)  Feeding Plan breast and EBM RPR Non Reactive (04/03 1651)  Contraception Considering MIrena HBsAg Negative (12/02 1605)  Circumcision If boy, yes HIV Non Reactive (04/03 1651)  Pediatrician  List given HCVAb Non Reactive (12/02 1605)  Prenatal Classes       Pap May  2024 - NIELM, negative HRHPV    GC/CT Initial:  neg/neg 36wks:  neg/neg    GBS  Negative       DME Rx [ ]  BP cuff [ ]  Weight Scale Waterbirth  [ ]  Class [ ]  Consent [ ]  CNM visit  PHQ9 & GAD7 [  ] new OB [  ] 28 weeks  [  ] 36 weeks Induction  [ ]  Orders Entered [ ] Foley Y/N    Prenatal History/Complications: chronic htn, DM2, hx STEMI, hx gastric surgery, AMA  Past Medical History: Past Medical History:  Diagnosis Date   Anxiety    Back pain    Chest pain    Coronary artery disease    Coronary  artery disease involving coronary bypass graft of native heart with angina pectoris (HCC) 08/02/2017   Diabetes mellitus    Difficulty swallowing pills    Fatigue    GERD (gastroesophageal reflux disease)    Gout    Hay fever    Headache    Heartburn    History of heart attack    History of varicella    HTN (hypertension)    Hyperlipidemia    Joint pain    Knee pain    Nervousness    Palpitations    Pure hypercholesterolemia with target low density lipoprotein (LDL) cholesterol less than 70 mg/dL 16/06/9603   Shortness of breath on exertion    Sickle cell trait (HCC) 09/03/2023   Vitamin D  deficiency 11/20/2018    Past Surgical History: Past Surgical History:  Procedure Laterality Date   CORONARY ARTERY BYPASS GRAFT N/A 08/02/2017   Procedure: CORONARY ARTERY BYPASS GRAFTING (CABG) X 5 (LIMA to LAD, LEFT RADIAL ARTERY to DIAGONAL, SVG to SEQUENTIALLY PDA and PLB) , USING LEFT INTERNAL MAMMARY ARTERY, LEFT RADIAL ARTERY, AND  SAPHENOUS VEIN to CIRCUMFLEX, RIGHT GREATER SAPHENOUS VEIN HARVESTED ENDOSCOPICALLY;  Surgeon: Norita Beauvais, MD;  Location: [facility] OR;  Service: Open Heart Surgery;  Laterality: N/A;   CORONARY/GRAFT ACUTE MI REVASCULARIZATION N/A 07/24/2017   Procedure: Coronary/Graft Acute MI Revascularization;  Surgeon: Arty Binning, MD;  Location: [facility] INVASIVE CV LAB;  Service: Cardiovascular;  Laterality: N/A;   LAPAROSCOPIC GASTRIC BANDING  09/02/2008   LEFT HEART CATH AND CORONARY ANGIOGRAPHY N/A 07/24/2017   Procedure: LEFT HEART CATH AND CORONARY ANGIOGRAPHY;  Surgeon: Arty Binning, MD;  Location: [facility] INVASIVE CV LAB;  Service: Cardiovascular;  Laterality: N/A;   RADIAL ARTERY HARVEST Left 08/02/2017   Procedure: LEFT RADIAL ARTERY HARVEST;  Surgeon: Norita Beauvais, MD;  Location: [facility] OR;  Service: Open Heart Surgery;  Laterality: Left;   STERNAL CLOSURE N/A 08/02/2017   Procedure: STERNAL PLATING;  Surgeon: Norita Beauvais, MD;  Location: [facility] OR;  Service:  Open Heart Surgery;  Laterality: N/A;   STOMACH SURGERY  1982   had metal removed from stomach as a child   TEE WITHOUT CARDIOVERSION N/A 08/02/2017   Procedure: TRANSESOPHAGEAL ECHOCARDIOGRAM (TEE);  Surgeon: Norita Beauvais, MD;  Location: [facility] OR;  Service: Open Heart Surgery;  Laterality: N/A;    Obstetrical History: OB History     Gravida  2   Para  0   Term  0   Preterm  0   AB  1   Living  0      SAB  1   IAB  0   Ectopic  0   Multiple  0   Live Births  0  Social History Social History   Socioeconomic History   Marital status: Married    Spouse name: Declan Adamson   Number of children: Not on file   Years of education: Not on file   Highest education level: Not on file  Occupational History   Occupation: Retail banker: [employer]  Tobacco Use   Smoking status: Never   Smokeless tobacco: Never  Vaping Use   Vaping status: Never Used  Substance and Sexual Activity   Alcohol use: Not Currently    Comment: occ   Drug use: No   Sexual activity: Not Currently  Other Topics Concern   Not on file  Social History Narrative   Not on file   Social Drivers of Health   Financial Resource Strain: Not on file  Food Insecurity: No Food Insecurity (02/19/2024)   Hunger Vital Sign    Worried About Running Out of Food in the Last Year: Never true    Ran Out of Food in the Last Year: Never true  Transportation Needs: No Transportation Needs (02/19/2024)   PRAPARE - Administrator, Civil Service (Medical): No    Lack of Transportation (Non-Medical): No  Physical Activity: Not on file  Stress: Not on file  Social Connections: Socially Isolated (02/19/2024)   Social Connection and Isolation Panel [NHANES]    Frequency of Communication with Friends and Family: Never    Frequency of Social Gatherings with Friends and Family: Never    Attends Religious Services: Never    Programmer, systems: No    Attends Engineer, structural: Never    Marital Status: Married    Family History: Family History  Problem Relation Age of Onset   Hyperlipidemia Mother    Hypertension Mother    Obesity Mother    Diabetes Father    Hypertension Father    Heart disease Father    Sudden death Father 47       died of MI   Cancer Maternal Grandmother        lung   Cancer Maternal Grandfather        prostate    Allergies: No Known Allergies  Medications Prior to Admission  Medication Sig Dispense Refill Last Dose/Taking   acetaminophen  (TYLENOL ) 500 MG tablet Take 500 mg by mouth every 6 (six) hours as needed for mild pain (pain score 1-3) or headache.      amLODipine  (NORVASC ) 2.5 MG tablet Take 1 tablet (2.5 mg total) by mouth daily. 90 tablet 3    aspirin  EC 81 MG tablet Take 2 tablets (162 mg total) by mouth daily. 300 tablet 2    BD PEN NEEDLE NANO 2ND GEN 32G X 4 MM MISC See admin instructions.      cholecalciferol (VITAMIN D3) 25 MCG (1000 UNIT) tablet Take 1,000 Units by mouth daily.      Continuous Glucose Sensor (DEXCOM G7 SENSOR) MISC daily at 6 (six) AM.      insulin  aspart (NOVOLOG  FLEXPEN) 100 UNIT/ML FlexPen Inject 8-12 Units into the skin 3 (three) times daily with meals. Take 8-12 units before each meal depending on the sugar range      LANTUS SOLOSTAR 100 UNIT/ML Solostar Pen Inject 17 Units into the skin at bedtime. PATIENT USES 17 UNITS IN THE MORNING AND 12 UNITS AT NIGHT.      metoprolol  succinate (TOPROL -XL) 25 MG 24 hr tablet Take 1 tablet (25 mg total) by  mouth daily. 30 tablet 3    polycarbophil (FIBERCON) 625 MG tablet Take 625 mg by mouth daily.      Prenatal 28-0.8 MG TABS Take 1 tablet by mouth daily.      Probiotic Product (PROBIOTIC DAILY PO) Take by mouth.      vitamin B-12 (CYANOCOBALAMIN) 100 MCG tablet Take 100 mcg by mouth daily.        Review of Systems   All systems reviewed and negative except as stated in HPI  Blood  pressure 129/70, pulse 76, resp. rate 18, last menstrual period 05/27/2023. Patient Vitals for the past 24 hrs:  BP Pulse Resp Height Weight  02/19/24 0815 -- -- -- 5\' 4"  (1.626 m) 123.3 kg  02/19/24 0812 129/70 76 18 -- --    General appearance: alert, cooperative, and no distress Lungs: respiratory rate and effort normal Heart: regular rate Abdomen: deferred Pelvic: SVE by RN Extremities: no sign of DVT Presentation: cephalic Fetal monitoringBaseline: 150 bpm, Variability: moderate, Accelerations: present, and Decelerations: Absent Uterine activityNone     Prenatal labs: ABO, Rh: AB/Positive/-- (12/02 1605) Antibody: Negative (12/02 1605) Rubella: 6.04 (12/02 1605) RPR: Non Reactive (04/03 1651)  HBsAg: Negative (12/02 1605)  HIV: Non Reactive (04/03 1651)  GBS: Negative/-- (05/14 1530)    Lab Results  Component Value Date   GBS Negative 02/01/2024   GTT (pre-existing diabetes) Genetic screening: low risk NIPS female, carrier sickle cell (08/22/23) Anatomy US : normal at 18w 3d   Immunization History  Administered Date(s) Administered   Influenza,inj,Quad PF,6+ Mos 06/20/2019, 10/29/2021    Prenatal Transfer Tool  Maternal Diabetes: Yes:  Diabetes Type:  Pre-pregnancy Genetic Screening: Normal Maternal Ultrasounds/Referrals: Normal, EFW 13% Fetal Ultrasounds or other Referrals:  Referred to Materal Fetal Medicine  Maternal Substance Abuse:  No Significant Maternal Medications:  None Significant Maternal Lab Results: Group B Strep negative Number of Prenatal Visits:greater than 3 verified prenatal visits Maternal Vaccinations: none Other Comments:  None   No results found for this or any previous visit (from the past 24 hours).  Patient Active Problem List   Diagnosis Date Noted   Preexisting hypertension complicating pregnancy, antepartum, third trimester 02/19/2024   Hyperglycemia due to type 2 diabetes mellitus (HCC) 01/06/2024   Non-toxic multinodular  goiter 01/06/2024   Subclinical hyperthyroidism 01/06/2024   Coronary artery disease 01/06/2024   BMI 40.0-44.9, adult (HCC) 11/17/2023   Maternal obesity affecting pregnancy, antepartum 09/11/2023   Cardiac complication 09/11/2023   Sickle cell trait (HCC) 09/03/2023   Preexisting diabetes complicating pregnancy, antepartum 08/17/2023   Preexisting hypertension complicating pregnancy, antepartum 08/17/2023   AMA (advanced maternal age) 56+ 08/17/2023   Anxiety    Supervision of high risk pregnancy, antepartum 08/09/2023   History of gastric surgery 09/03/2019   Essential hypertension 09/05/2018   History of ST elevation myocardial infarction 07/24/2017   Type 2 diabetes mellitus with obesity (HCC) 07/24/2017    Assessment/Plan:  MEAGHEN VECCHIARELLI is a 43 y.o. G2P0010 at [redacted]w[redacted]d here for IOL for cHTN and type 2 DM.  #Labor: vertex presentation confirmed, cervix closed and thick, begin cytotec #Pain: resting comfortably #FWB: Cat 1 #GBS status: negative #Feeding: Breastmilk  #Reproductive Life planning: considering IUD #Circ: NA (female) #Type 2 DM: took home Lantus 13u this AM, RN to give mealtime Novolog  8u now, will check CBG q2   during early labor, will consider Endotool in active labor, 2nd IV in place #Chronic HTN: amlodipine  2.5 mg daily, metoprolol  25 mg daily (also as antianginal);  followed by [facility] during   pregnancy due to chronic htn, history of STEMI at 43y/o, and CABG in 2018. #CAD  Followed by OB Cards. Enrolled in IMPACT study. Discussed increased risk of cardiac complications especially postpartum. Need for close F/U emphasized.    Consulted with Dr. Randolm Butte RE: POC. Agrees w/ plan. Anticipate Endotool in active labor. Tele monitoring not indicated at this time.   Fonda Hymen, Student-MidWife  02/19/2024, 8:55 AM  I was present for the exam and agree with above.  [name], Uniqua Kihn , CNM 02/19/2024 12:13 PM

## 2024-02-19 NOTE — Progress Notes (Signed)
 [name] is a 43 y.o. G2P0010 at [redacted]w[redacted]d.  Subjective: Mild cramping  Objective: BP (!) 123/52   Pulse 73   Temp 98.8 F (37.1 C) (Oral)   Resp 18   Ht 5\' 4"  (1.626 m)   Wt 123.3 kg   LMP 05/27/2023 (Exact Date)   BMI 46.65 kg/m    FHT:  FHR: 145 bpm, variability: mod,  accelerations:  10x10,  decelerations:  none UC:   Q 2-4 minutes, mild Dilation: Fingertip Effacement (%): Thick Station: -3 Presentation: Vertex Exam by:: Pressley Brome  Labs: Results for orders placed or performed during the hospital encounter of 02/19/24 (from the past 24 hours)  CBC     Status: Abnormal   Collection Time: 02/19/24  8:12 AM  Result Value Ref Range   WBC 9.4 4.0 - 10.5 K/uL   RBC 5.02 3.87 - 5.11 MIL/uL   Hemoglobin 13.3 12.0 - 15.0 g/dL   HCT 30.8 65.7 - 84.6 %   MCV 79.3 (L) 80.0 - 100.0 fL   MCH 26.5 26.0 - 34.0 pg   MCHC 33.4 30.0 - 36.0 g/dL   RDW 96.2 95.2 - 84.1 %   Platelets 291 150 - 400 K/uL   nRBC 0.0 0.0 - 0.2 %  Type and screen     Status: None   Collection Time: 02/19/24  8:12 AM  Result Value Ref Range   ABO/RH(D) AB POS    Antibody Screen NEG    Sample Expiration      02/22/2024,2359 Performed at [facility], [address], [place], [place] [address]   Comprehensive metabolic panel     Status: Abnormal   Collection Time: 02/19/24  8:12 AM  Result Value Ref Range   Sodium 137 135 - 145 mmol/L   Potassium 3.7 3.5 - 5.1 mmol/L   Chloride 109 98 - 111 mmol/L   CO2 20 (L) 22 - 32 mmol/L   Glucose, Bld 78 70 - 99 mg/dL   BUN 10 6 - 20 mg/dL   Creatinine, Ser 1.02 0.44 - 1.00 mg/dL   Calcium  8.5 (L) 8.9 - 10.3 mg/dL   Total Protein 6.2 (L) 6.5 - 8.1 g/dL   Albumin  2.5 (L) 3.5 - 5.0 g/dL   AST 20 15 - 41 U/L   ALT 16 0 - 44 U/L   Alkaline Phosphatase 63 38 - 126 U/L   Total Bilirubin 0.8 0.0 - 1.2 mg/dL   GFR, Estimated >72 >53 mL/min   Anion gap 8 5 - 15  Glucose, capillary     Status: Abnormal   Collection Time: 02/19/24  9:55 AM  Result  Value Ref Range   Glucose-Capillary 67 (L) 70 - 99 mg/dL     Glucose Monitoring  for the past 12 hrs:  Continuous Glucose Reading (mg/dL) Reference Range 66-44 mg/dL  03/47/42 5956 (!) 387 mg/dL  56/43/32 9518 98 mg/dL  84/16/60 6301 88 mg/dL     Assessment / Plan: [redacted]w[redacted]d week IUP ROM x NA Labor: Early/IOL. Slight cervical. Still not able to place foley balloon for ripening. Do not recommend further Cytotec due to few decels and decrease variability after previous dose. Recommend low doe pitocin. Pt and family agree.  Fetal Wellbeing:  Category I, Now with moderate variability, 10x10 acels. Pain Control:  Comfort measures. Anesthesia consult competed  Anticipated MOD:  SVD CHTN: Few mildy elevated BP. Continue home BP meds. Plan Lasix  PP and may need to adjust antihypertensives T2DM: CBGs Nml on  Lantus. Has not needed SSI. Plan to switch to Endotool in active labor.  CAD. Hx STEMI and CABG: F/U OB Cards PP. Anesthesia consult IP.   [name] [name] , CNM 02/19/2024 6:44 PM

## 2024-02-19 NOTE — Progress Notes (Signed)
 L&D Note  02/19/2024 - 9:00 PM  Late entry for 2130 43 y.o. G2P0010 [redacted]w[redacted]d. Pregnancy complicated by:  Patient Active Problem List   Diagnosis Date Noted   Preexisting hypertension complicating pregnancy, antepartum, third trimester 02/19/2024   Hyperglycemia due to type 2 diabetes mellitus (HCC) 01/06/2024   Non-toxic multinodular goiter 01/06/2024   Subclinical hyperthyroidism 01/06/2024   Coronary artery disease 01/06/2024   BMI 40.0-44.9, adult (HCC) 11/17/2023   Maternal obesity affecting pregnancy, antepartum 09/11/2023   Cardiac complication 09/11/2023   Sickle cell trait (HCC) 09/03/2023   Preexisting diabetes complicating pregnancy, antepartum 08/17/2023   Preexisting hypertension complicating pregnancy, antepartum 08/17/2023   AMA (advanced maternal age) 21+ 08/17/2023   Anxiety    Supervision of high risk pregnancy, antepartum 08/09/2023   History of gastric surgery 09/03/2019   Essential hypertension 09/05/2018   History of ST elevation myocardial infarction 07/24/2017   Type 2 diabetes mellitus with obesity (HCC) 07/24/2017   [name] is admitted for IOL for DM2   Subjective:  Feeling regular UCs, not painful, every few minutes  Objective:   T 98.2 F (36.8 C) Temp  Avg: 98.5 F (36.9 C)  Min: 98.2 F (36.8 C)  Max: 98.8 F (37.1 C)  BP 117/69 BP  Min: 114/48  Max: 150/78  HR 76 Pulse  Avg: 74.3  Min: 71  Max: 79  RR 18 Resp  Avg: 18  Min: 18  Max: 18  SaO2     No data recorded       24 Hour I/O Current Shift I/O  Time Ins Outs No intake/output data recorded. No intake/output data recorded.   FHR: 145 baseline, no accels, occasional subtle late decels, min to mod variability Toco: q2-29m Gen: NAD SVE: deferred  Labs:  Recent Labs  Lab 02/15/24 1640 02/19/24 0812  WBC 9.7 9.4  HGB 12.3 13.3  HCT 38.0 39.8  PLT 299 291   Recent Labs  Lab 02/15/24 1640 02/19/24 0812  NA 139 137  K 4.4 3.7  CL 106 109  CO2 18* 20*  BUN 13 10   CREATININE 0.70 0.81  CALCIUM  8.9 8.5*  PROT 5.8* 6.2*  BILITOT 0.2 0.8  ALKPHOS 87 63  ALT 13 16  AST 18 20  GLUCOSE 60* 78   Medications Current Facility-Administered Medications  Medication Dose Route Frequency Provider Last Rate Last Admin   acetaminophen  (TYLENOL ) tablet 650 mg  650 mg Oral Q4H PRN Anyanwu, Ugonna A, MD       amLODipine  (NORVASC ) tablet 2.5 mg  2.5 mg Oral Daily Smith, Virginia , CNM   2.5 mg at 02/19/24 1226   fentaNYL  (SUBLIMAZE ) injection 50-100 mcg  50-100 mcg Intravenous Q1H PRN Anyanwu, Ugonna A, MD       hydrOXYzine (ATARAX) tablet 50 mg  50 mg Oral Q6H PRN Anyanwu, Ugonna A, MD       insulin  aspart (novoLOG ) injection 0-14 Units  0-14 Units Subcutaneous Q4H Smith, Virginia , CNM   3 Units at 02/19/24 2003   lactated ringers  infusion 500-1,000 mL  500-1,000 mL Intravenous PRN Anyanwu, Kathrine Paris, MD       lactated ringers  infusion   Intravenous Continuous Smith, Virginia , CNM 50 mL/hr at 02/19/24 1222 Rate Change at 02/19/24 1222   lidocaine  (PF) (XYLOCAINE ) 1 % injection 30 mL  30 mL Subcutaneous PRN Anyanwu, Ugonna A, MD       metoprolol  succinate (TOPROL -XL) 24 hr tablet 25 mg  25 mg Oral Daily Smith, Virginia ,  CNM   25 mg at 02/19/24 1227   ondansetron  (ZOFRAN ) injection 4 mg  4 mg Intravenous Q6H PRN Anyanwu, Ugonna A, MD       oxytocin (PITOCIN) IV BOLUS FROM BAG  333 mL Intravenous Once Anyanwu, Ugonna A, MD       oxytocin (PITOCIN) IV infusion 30 units in NS 500 mL - Premix  2.5 Units/hr Intravenous Continuous Anyanwu, Ugonna A, MD       oxytocin (PITOCIN) IV infusion 30 units in NS 500 mL - Premix  1-40 milli-units/min Intravenous Titrated Smith, Virginia , CNM 3 mL/hr at 02/19/24 1831 3 milli-units/min at 02/19/24 1831   sodium citrate-citric acid (ORACIT) solution 30 mL  30 mL Oral Q2H PRN Anyanwu, Ugonna A, MD       sodium phosphate (FLEET) enema 1 enema  1 enema Rectal Daily PRN Anyanwu, Ugonna A, MD       terbutaline (BRETHINE) injection 0.25 mg   0.25 mg Subcutaneous Once PRN Anyanwu, Ugonna A, MD       zolpidem (AMBIEN) tablet 5 mg  5 mg Oral QHS PRN Anyanwu, Ugonna A, MD        Assessment & Plan:  Patient stable.  *Pregnancy: currently category II tracing. See below *Labor: She is currently on pitocin 3 and got cytotec 50 po and 25 vag at 1145am earlier thoday. I told her that her FHR tracing is borderline for a +CST/continuing with trying for a vaginal delivery. I told her that I'll come back in about an hour to reassess and if it looks okay to continue with her IOL then will do just a balloon (no pit or cytotec) as the next steps. Baby is borderline FGR. D/w them indications for c-section. *CV: followed by [facility] cardiology. Continue toprol  and amlodipine , both used for anti-anginal agent vs BP control. 05/2023 echo with LV has regional wall motion abnormalities with inferoseptal and basal inferior hypokinesis, EF 50-55%.  -lasix  and kdur postpartum.  *DM2: has dexcom in place, currently reading 148. Pt was 68 and then spiked to the 180s with some PO juice and crackers. 3u aspart at 2003. Pt has very labile CBGs so will d/c her long acting for tonight and continue with q1-2h CBGs and see about insulin  gtt vs PRN IV insulin  *History of lap band: no current issues.  *FEN/GI: was on clears. Told pt to be NPO for now. Has MIVF now. Will monitor I/O closely to avoid fluid overload *GBS: negative *Analgesia: no current needs  Tyler Gallant MD Attending Center for [facility])

## 2024-02-20 ENCOUNTER — Encounter (HOSPITAL_COMMUNITY)
Admission: RE | Disposition: A | Discharge status: Home / Self Care | Payer: Self-pay | Source: Home / Self Care | Attending: Family Medicine

## 2024-02-20 ENCOUNTER — Inpatient Hospital Stay (HOSPITAL_COMMUNITY): Payer: Self-pay | Admitting: Anesthesiology

## 2024-02-20 DIAGNOSIS — Z3A37 37 weeks gestation of pregnancy: Secondary | ICD-10-CM

## 2024-02-20 DIAGNOSIS — O09523 Supervision of elderly multigravida, third trimester: Secondary | ICD-10-CM | POA: Diagnosis not present

## 2024-02-20 DIAGNOSIS — O1002 Pre-existing essential hypertension complicating childbirth: Secondary | ICD-10-CM | POA: Diagnosis not present

## 2024-02-20 DIAGNOSIS — O365930 Maternal care for other known or suspected poor fetal growth, third trimester, not applicable or unspecified: Secondary | ICD-10-CM | POA: Diagnosis not present

## 2024-02-20 DIAGNOSIS — O24013 Pre-existing diabetes mellitus, type 1, in pregnancy, third trimester: Secondary | ICD-10-CM | POA: Diagnosis not present

## 2024-02-20 LAB — COMPREHENSIVE METABOLIC PANEL WITH GFR
ALT: 15 U/L (ref 0–44)
AST: 20 U/L (ref 15–41)
Albumin: 2.3 g/dL — ABNORMAL LOW (ref 3.5–5.0)
Alkaline Phosphatase: 69 U/L (ref 38–126)
Anion gap: 9 (ref 5–15)
BUN: 7 mg/dL (ref 6–20)
CO2: 18 mmol/L — ABNORMAL LOW (ref 22–32)
Calcium: 8 mg/dL — ABNORMAL LOW (ref 8.9–10.3)
Chloride: 107 mmol/L (ref 98–111)
Creatinine, Ser: 0.75 mg/dL (ref 0.44–1.00)
GFR, Estimated: >60 mL/min (ref >60)
Glucose, Bld: 86 mg/dL (ref 70–99)
Potassium: 3.7 mmol/L (ref 3.5–5.1)
Sodium: 134 mmol/L — ABNORMAL LOW (ref 135–145)
Total Bilirubin: 0.9 mg/dL (ref 0.0–1.2)
Total Protein: 5.8 g/dL — ABNORMAL LOW (ref 6.5–8.1)

## 2024-02-20 LAB — CBC WITH DIFFERENTIAL/PLATELET
Abs Immature Granulocytes: 0.04 10*3/uL (ref 0.00–0.07)
Basophils Absolute: 0 10*3/uL (ref 0.0–0.1)
Basophils Relative: 0 %
Eosinophils Absolute: 0.2 10*3/uL (ref 0.0–0.5)
Eosinophils Relative: 2 %
HCT: 37.4 % (ref 36.0–46.0)
Hemoglobin: 12.4 g/dL (ref 12.0–15.0)
Immature Granulocytes: 0 %
Lymphocytes Relative: 13 %
Lymphs Abs: 1.4 10*3/uL (ref 0.7–4.0)
MCH: 25.9 pg — ABNORMAL LOW (ref 26.0–34.0)
MCHC: 33.2 g/dL (ref 30.0–36.0)
MCV: 78.1 fL — ABNORMAL LOW (ref 80.0–100.0)
Monocytes Absolute: 0.6 10*3/uL (ref 0.1–1.0)
Monocytes Relative: 6 %
Neutro Abs: 7.9 10*3/uL — ABNORMAL HIGH (ref 1.7–7.7)
Neutrophils Relative %: 79 %
Platelets: 270 10*3/uL (ref 150–400)
RBC: 4.79 MIL/uL (ref 3.87–5.11)
RDW: 14.6 % (ref 11.5–15.5)
WBC: 10.1 10*3/uL (ref 4.0–10.5)
nRBC: 0 % (ref 0.0–0.2)

## 2024-02-20 LAB — GLUCOSE, CAPILLARY
Glucose-Capillary: 98 mg/dL (ref 70–99)
Glucose-Capillary: 86 mg/dL (ref 70–99)
Glucose-Capillary: 110 mg/dL — ABNORMAL HIGH (ref 70–99)
Glucose-Capillary: 95 mg/dL (ref 70–99)
Glucose-Capillary: 85 mg/dL (ref 70–99)

## 2024-02-20 LAB — RPR: RPR Ser Ql: NONREACTIVE

## 2024-02-20 SURGERY — Surgical Case
Anesthesia: Epidural

## 2024-02-20 MED ORDER — LACTATED RINGERS IV SOLN: INTRAVENOUS | Status: DC

## 2024-02-20 MED ORDER — LIDOCAINE-EPINEPHRINE (PF) 2 %-1:200000 IJ SOLN
INTRAMUSCULAR | Status: DC | PRN
  Administered 2024-02-20: 4 mL via EPIDURAL
  Administered 2024-02-20: 8 mL via EPIDURAL
  Administered 2024-02-20: 2 mL via EPIDURAL

## 2024-02-20 MED ORDER — ONDANSETRON HCL 4 MG/2ML IJ SOLN
INTRAMUSCULAR | Status: DC | PRN
  Administered 2024-02-20: 4 mg via INTRAVENOUS

## 2024-02-20 MED ORDER — TRANEXAMIC ACID-NACL 1000-0.7 MG/100ML-% IV SOLN: 1000.0000 mg | Freq: Once | INTRAVENOUS | Status: DC

## 2024-02-20 MED ORDER — PHENYLEPHRINE 80 MCG/ML (10ML) SYRINGE FOR IV PUSH (FOR BLOOD PRESSURE SUPPORT)
80.0000 ug | PREFILLED_SYRINGE | INTRAVENOUS | Status: DC | PRN
  Administered 2024-02-20: 80 ug via INTRAVENOUS

## 2024-02-20 MED ORDER — SODIUM CHLORIDE 0.9 % IV SOLN: INTRAVENOUS | Status: DC | PRN

## 2024-02-20 MED ORDER — DIPHENHYDRAMINE HCL 50 MG/ML IJ SOLN: 12.5000 mg | INTRAMUSCULAR | Status: DC | PRN

## 2024-02-20 MED ORDER — SODIUM CHLORIDE 0.9 % IV SOLN
500.0000 mg | INTRAVENOUS | Status: AC
Start: 2024-02-20 — End: 2024-02-21
  Administered 2024-02-20: 500 mg via INTRAVENOUS

## 2024-02-20 MED ORDER — FENTANYL-BUPIVACAINE-NACL 0.5-0.125-0.9 MG/250ML-% EP SOLN
12.0000 mL/h | EPIDURAL | Status: DC | PRN
  Administered 2024-02-20: 12 mL/h via EPIDURAL
  Filled 2024-02-20: qty 250

## 2024-02-20 MED ORDER — LACTATED RINGERS IV SOLN: 500.0000 mL | INTRAVENOUS | Status: DC | PRN

## 2024-02-20 MED ORDER — LACTATED RINGERS IV BOLUS
500.0000 mL | Freq: Once | INTRAVENOUS | Status: AC
  Administered 2024-02-20: 500 mL via INTRAVENOUS

## 2024-02-20 MED ORDER — LIDOCAINE HCL (PF) 1 % IJ SOLN
INTRAMUSCULAR | Status: DC | PRN
  Administered 2024-02-20 (×2): 4 mL via EPIDURAL

## 2024-02-20 MED ORDER — CEFAZOLIN SODIUM-DEXTROSE 3-4 GM/150ML-% IV SOLN
3.0000 g | INTRAVENOUS | Status: AC
  Administered 2024-02-20: 3 g via INTRAVENOUS

## 2024-02-20 MED ORDER — FENTANYL CITRATE (PF) 100 MCG/2ML IJ SOLN
INTRAMUSCULAR | Status: AC
  Filled 2024-02-20: qty 2

## 2024-02-20 MED ORDER — MORPHINE SULFATE (PF) 0.5 MG/ML IJ SOLN
INTRAMUSCULAR | Status: AC
  Filled 2024-02-20: qty 10

## 2024-02-20 MED ORDER — EPHEDRINE 5 MG/ML INJ: 10.0000 mg | INTRAVENOUS | Status: DC | PRN

## 2024-02-20 MED ORDER — OXYTOCIN-SODIUM CHLORIDE 30-0.9 UT/500ML-% IV SOLN
INTRAVENOUS | Status: DC | PRN
  Administered 2024-02-20: 30 [IU] via INTRAVENOUS

## 2024-02-20 MED ORDER — SOD CITRATE-CITRIC ACID 500-334 MG/5ML PO SOLN
30.0000 mL | ORAL | Status: AC
  Administered 2024-02-20: 30 mL via ORAL
  Filled 2024-02-20: qty 30

## 2024-02-20 MED ORDER — EPHEDRINE 5 MG/ML INJ
10.0000 mg | INTRAVENOUS | Status: AC | PRN
  Administered 2024-02-20 (×2): 10 mg via INTRAVENOUS
  Filled 2024-02-20: qty 5

## 2024-02-20 MED ORDER — TRANEXAMIC ACID-NACL 1000-0.7 MG/100ML-% IV SOLN
INTRAVENOUS | Status: DC | PRN
  Administered 2024-02-20: 1000 mg via INTRAVENOUS

## 2024-02-20 MED ORDER — LACTATED RINGERS IV SOLN: INTRAVENOUS | Status: DC | PRN

## 2024-02-20 MED ORDER — MORPHINE SULFATE (PF) 0.5 MG/ML IJ SOLN
INTRAMUSCULAR | Status: DC | PRN
  Administered 2024-02-20: 3 mg via EPIDURAL

## 2024-02-20 MED ORDER — LACTATED RINGERS IV SOLN
500.0000 mL | Freq: Once | INTRAVENOUS | Status: AC
  Administered 2024-02-20: 500 mL via INTRAVENOUS

## 2024-02-20 MED ORDER — ACETAMINOPHEN 10 MG/ML IV SOLN
INTRAVENOUS | Status: DC | PRN
  Administered 2024-02-20: 1000 mg via INTRAVENOUS

## 2024-02-20 MED ORDER — PHENYLEPHRINE 80 MCG/ML (10ML) SYRINGE FOR IV PUSH (FOR BLOOD PRESSURE SUPPORT)
80.0000 ug | PREFILLED_SYRINGE | INTRAVENOUS | Status: DC | PRN
  Administered 2024-02-20: 160 ug via INTRAVENOUS
  Filled 2024-02-20: qty 10

## 2024-02-20 SURGICAL SUPPLY — 33 items
BENZOIN TINCTURE PRP APPL 2/3 (GAUZE/BANDAGES/DRESSINGS) IMPLANT
CHLORAPREP W/TINT 26 (MISCELLANEOUS) ×4 IMPLANT
CLAMP UMBILICAL CORD (MISCELLANEOUS) ×2 IMPLANT
CLOTH BEACON ORANGE TIMEOUT ST (SAFETY) ×2 IMPLANT
DRESSING PREVENA PLUS CUSTOM (GAUZE/BANDAGES/DRESSINGS) IMPLANT
DRSG OPSITE POSTOP 4X10 (GAUZE/BANDAGES/DRESSINGS) ×2 IMPLANT
ELECTRODE REM PT RTRN 9FT ADLT (ELECTROSURGICAL) ×2 IMPLANT
EXTRACTOR VACUUM M CUP 4 TUBE (SUCTIONS) IMPLANT
GLOVE BIOGEL PI IND STRL 7.0 (GLOVE) ×6 IMPLANT
GLOVE ECLIPSE 7.0 STRL STRAW (GLOVE) ×2 IMPLANT
GOWN STRL REUS W/TWL LRG LVL3 (GOWN DISPOSABLE) ×2 IMPLANT
GOWN STRL REUS W/TWL XL LVL3 (GOWN DISPOSABLE) ×2 IMPLANT
KIT ABG SYR 3ML LUER SLIP (SYRINGE) IMPLANT
MAT PREVALON FULL STRYKER (MISCELLANEOUS) IMPLANT
NDL HYPO 25X5/8 SAFETYGLIDE (NEEDLE) ×2 IMPLANT
NEEDLE HYPO 22GX1.5 SAFETY (NEEDLE) ×2 IMPLANT
NEEDLE HYPO 25X5/8 SAFETYGLIDE (NEEDLE) ×1 IMPLANT
NS IRRIG 1000ML POUR BTL (IV SOLUTION) ×2 IMPLANT
PACK C SECTION WH (CUSTOM PROCEDURE TRAY) ×2 IMPLANT
PAD ABD 7.5X8 STRL (GAUZE/BANDAGES/DRESSINGS) ×2 IMPLANT
PAD OB MATERNITY 4.3X12.25 (PERSONAL CARE ITEMS) ×2 IMPLANT
RETRACTOR TRAXI PANNICULUS (MISCELLANEOUS) IMPLANT
RTRCTR C-SECT PINK 25CM LRG (MISCELLANEOUS) IMPLANT
STRIP CLOSURE SKIN 1/2X4 (GAUZE/BANDAGES/DRESSINGS) IMPLANT
SUT PDS AB 0 CTX 36 PDP370T (SUTURE) IMPLANT
SUT PDS AB 0 CTX 60 (SUTURE) IMPLANT
SUT PLAIN 2 0 XLH (SUTURE) IMPLANT
SUT VIC AB 0 CTX36XBRD ANBCTRL (SUTURE) ×4 IMPLANT
SUT VIC AB 4-0 KS 27 (SUTURE) ×2 IMPLANT
SYR CONTROL 10ML LL (SYRINGE) ×2 IMPLANT
TOWEL OR 17X24 6PK STRL BLUE (TOWEL DISPOSABLE) ×2 IMPLANT
TRAY FOLEY W/BAG SLVR 14FR LF (SET/KITS/TRAYS/PACK) ×2 IMPLANT
WATER STERILE IRR 1000ML POUR (IV SOLUTION) ×2 IMPLANT

## 2024-02-20 NOTE — Plan of Care (Signed)
  Problem: Education: Goal: Knowledge of General Education information will improve Description: Including pain rating scale, medication(s)/side effects and non-pharmacologic comfort measures Outcome: Progressing   Problem: Health Behavior/Discharge Planning: Goal: Ability to manage health-related needs will improve Outcome: Progressing   Problem: Clinical Measurements: Goal: Ability to maintain clinical measurements within normal limits will improve Outcome: Progressing Goal: Will remain free from infection Outcome: Progressing Goal: Diagnostic test results will improve Outcome: Progressing Goal: Respiratory complications will improve Outcome: Progressing Goal: Cardiovascular complication will be avoided Outcome: Progressing   Problem: Activity: Goal: Risk for activity intolerance will decrease Outcome: Progressing   Problem: Nutrition: Goal: Adequate nutrition will be maintained Outcome: Progressing   Problem: Coping: Goal: Level of anxiety will decrease Outcome: Progressing   Problem: Elimination: Goal: Will not experience complications related to bowel motility Outcome: Progressing Goal: Will not experience complications related to urinary retention Outcome: Progressing   Problem: Pain Managment: Goal: General experience of comfort will improve and/or be controlled Outcome: Progressing   Problem: Safety: Goal: Ability to remain free from injury will improve Outcome: Progressing   Problem: Skin Integrity: Goal: Risk for impaired skin integrity will decrease Outcome: Progressing   Problem: Education: Goal: Knowledge of Childbirth will improve Outcome: Progressing Goal: Ability to make informed decisions regarding treatment and plan of care will improve Outcome: Progressing Goal: Ability to state and carry out methods to decrease the pain will improve Outcome: Progressing Goal: Individualized Educational Video(s) Outcome: Progressing   Problem:  Coping: Goal: Ability to verbalize concerns and feelings about labor and delivery will improve Outcome: Progressing   Problem: Life Cycle: Goal: Ability to make normal progression through stages of labor will improve Outcome: Progressing Goal: Ability to effectively push during vaginal delivery will improve Outcome: Progressing   Problem: Role Relationship: Goal: Will demonstrate positive interactions with the child Outcome: Progressing   Problem: Safety: Goal: Risk of complications during labor and delivery will decrease Outcome: Progressing   Problem: Pain Management: Goal: Relief or control of pain from uterine contractions will improve Outcome: Progressing   Problem: Education: Goal: Ability to describe self-care measures that may prevent or decrease complications (Diabetes Survival Skills Education) will improve Outcome: Progressing Goal: Individualized Educational Video(s) Outcome: Progressing   Problem: Coping: Goal: Ability to adjust to condition or change in health will improve Outcome: Progressing   Problem: Fluid Volume: Goal: Ability to maintain a balanced intake and output will improve Outcome: Progressing   Problem: Health Behavior/Discharge Planning: Goal: Ability to identify and utilize available resources and services will improve Outcome: Progressing Goal: Ability to manage health-related needs will improve Outcome: Progressing   Problem: Metabolic: Goal: Ability to maintain appropriate glucose levels will improve Outcome: Progressing   Problem: Nutritional: Goal: Maintenance of adequate nutrition will improve Outcome: Progressing Goal: Progress toward achieving an optimal weight will improve Outcome: Progressing   Problem: Skin Integrity: Goal: Risk for impaired skin integrity will decrease Outcome: Progressing   Problem: Tissue Perfusion: Goal: Adequacy of tissue perfusion will improve Outcome: Progressing

## 2024-02-20 NOTE — Progress Notes (Addendum)
 Labor Progress Note [name] is a 43 y.o. G2P0010 at [redacted]w[redacted]d presented for IOL due to [facility], preg c/b h/o CABG, abnormal LV function, hx lap band  S: Had a prolonged decel after epidural, FHR back at baseline. Comfortable after epidural, no questions at this time.  O:  BP 137/67   Pulse 72   Temp 98 F (36.7 C)   Resp 16   Ht 5\' 4"  (1.626 m)   Wt 123.3 kg   LMP 05/27/2023 (Exact Date)   SpO2 97%   BMI 46.65 kg/m  EFM: 150/mod/-a/-d  CVE: Dilation: 4 Effacement (%): 50 Station: -3 Presentation: Vertex Exam by:: Dr. Hubert Madden   A&P: 43 y.o. G2P0010 [redacted]w[redacted]d here for IOL #Labor: Cx still thick  risks/benefits of AROM discussed, pt provided verbal consent  AROM performed, moderate amount of clear fluid  baby has been difficult to trace w position changes, so discussed role of FSE and IUPC, which pt consented to and were both placed successfully  cont Pit 1x1 #Pain: Epidural #FWB: Cat II, overall reassuring #GBS negative  #CHTN: Cont Labetalol , Amlodipine   will start Lasix  PP  #Hx STEMI s/p CABG 2018  #T2DM: Cont routine CBG, start Endotool as indicated  #BMI 46  #Subclinical hypothyroidism: last TSH wnl 12/2023  Melanie Spires, MD 6:08 PM

## 2024-02-20 NOTE — Progress Notes (Signed)
 8.5 minute prolonged at 1611  BP-related after epidural  Dr. Racheal Buddle bedside

## 2024-02-20 NOTE — Anesthesia Procedure Notes (Signed)
 Epidural Patient location during procedure: OB Start time: 02/20/2024 3:35 PM End time: 02/20/2024 3:40 PM  Staffing Anesthesiologist: Conard Decent, MD Performed: anesthesiologist   Preanesthetic Checklist Completed: patient identified, IV checked, site marked, risks and benefits discussed, surgical consent, monitors and equipment checked, pre-op evaluation and timeout performed  Epidural Patient position: sitting Prep: DuraPrep and site prepped and draped Patient monitoring: continuous pulse ox and blood pressure Approach: midline Location: L3-L4 Injection technique: LOR saline  Needle:  Needle type: Tuohy  Needle gauge: 17 G Needle length: 9 cm and 9 Needle insertion depth: 8 cm Catheter type: closed end flexible Catheter size: 19 Gauge Catheter at skin depth: 13 cm Test dose: negative  Assessment Events: blood not aspirated, no cerebrospinal fluid, injection not painful, no injection resistance, no paresthesia and negative IV test  Additional Notes The patient has requested an epidural for labor pain management. Risks and benefits including, but not limited to, infection, bleeding, local anesthetic toxicity, headache, hypotension, back pain, block failure, etc. were discussed with the patient. The patient expressed understanding and consented to the procedure. I confirmed that the patient has no bleeding disorders and is not taking blood thinners. I confirmed the patient's last platelet count with the nurse. A time-out was performed immediately prior to the procedure. Please see nursing documentation for vital signs. Sterile technique was used throughout the whole procedure. Once LOR achieved, the epidural catheter threaded easily without resistance. Aspiration of the catheter was negative for blood and CSF. The epidural was dosed slowly and an infusion was started.  1 attempt(s)Reason for block:procedure for pain

## 2024-02-20 NOTE — Progress Notes (Signed)
 Labor Progress Note [name] is a 43 y.o. G2P0010 at [redacted]w[redacted]d presented for IOL due to [facility], preg c/b h/o CABG, abnormal LV function, hx lap band  S: No concerns at this time.  O:  BP 133/72   Pulse 75   Temp 98.2 F (36.8 C) (Oral)   Resp 16   Ht 5\' 4"  (1.626 m)   Wt 123.3 kg   LMP 05/27/2023 (Exact Date)   BMI 46.65 kg/m  EFM: 150/min to mod/10x10/variable decels occ  CVE: Dilation: 3 Effacement (%): 50 Station: Ballotable Presentation: Vertex Exam by:: Dr. Hubert Madden   A&P: 43 y.o. G2P0010 [redacted]w[redacted]d here for IOL #Labor: Progressing well. FB still in, continuing Pit #Pain: Per pt request #FWB: Cat II, overall reassuring #GBS negative  #CHTN: Cont Labetalol , Amlodipine   will start Lasix  PP  #Hx STEMI s/p CABG 2018  #T2DM: Cont routine CBG, start Endotool as indicated  #BMI 46  #Subclinical hypothyroidism: last TSH wnl 12/2023  Melanie Spires, MD 2:09 PM

## 2024-02-20 NOTE — Progress Notes (Signed)
 LABOR NOTE [name] is a 43 y.o. G2P0010 at [redacted]w[redacted]d admitted for IOL due to [facility], preg c/b h/o CABG, abnormal LV function, hx lap band  Subjective: She is comfortable with epidural and patiently awaiting baby girl.   Objective: BP (!) 124/56   Pulse 68   Temp 98.1 F (36.7 C) (Oral)   Resp 18   Ht 5\' 4"  (1.626 m)   Wt 123.3 kg   LMP 05/27/2023 (Exact Date)   SpO2 97%   BMI 46.65 kg/m  Total I/O In: -  Out: 950 [Urine:950]  FHR baseline 150 bpm, Variability: areas of minimal and moderate, Accelerations:present, Decelerations:  Present none currently.  Toco: MVU 190 every 3-4 mins   SVE:   Dilation: 3.5 Effacement (%): 50 Station: -3 Exam by:: Dr Cyrena Drilling  Pitocin @ 6 mu/min  Labs: Lab Results  Component Value Date   WBC 10.1 02/20/2024   HGB 12.4 02/20/2024   HCT 37.4 02/20/2024   MCV 78.1 (L) 02/20/2024   PLT 270 02/20/2024    Assessment / Plan: S/P AROM and cytotec and pit for IOL at [redacted]w[redacted]d due to CHTN,T2DM.  Preg c/b h/o CABG, abnormal LV function   Discussed labor progress with patient, no cervical change in hours despite adequate contractions and repetitive periods of Category II FHR tracing.  There is concern about fetal distress, patient wanted to discuss options for delivery.  She was given two options: continue to monitor for now and proceed with induction, and hope for more Category 1 than 2 FHR tracing periods and have a vaginal delivery.  She was informed she was remote from vaginal delivery given her current cervical examination.  The other option was to proceed with cesarean section.  Discussed concerns about this surgery, especially given her history of CABG, CHTN and T2DM. The risks of surgery were discussed with the patient including but were not limited to: bleeding which may require transfusion or reoperation; infection which may require antibiotics; injury to bowel, bladder, ureters or other surrounding organs; injury to the fetus; need for  additional procedures including hysterectomy in the event of a life-threatening hemorrhage; formation of adhesions; placental abnormalities with subsequent pregnancies; incisional problems; thromboembolic phenomenon and other postoperative/anesthesia complications.  Patient will discuss with her family and let me know her decision.  I did reiterate that there was Category I FHR tracing at the present, no emergent need for delivery.    Lenoard Rad, MD

## 2024-02-20 NOTE — Progress Notes (Signed)
 LABOR NOTE [name] is a 43 y.o. G2P0010 at [redacted]w[redacted]d admitted for IOL due to [facility], preg c/b h/o CABG, abnormal LV function, hx lap band  Subjective: Introductions exchanged. Patient Significant other and other family present and supportive. She is comfortable with epidural and patiently awaiting baby girl.   Objective: BP (!) 117/42   Pulse 74   Temp 97.9 F (36.6 C) (Oral)   Resp 18   Ht 5\' 4"  (1.626 m)   Wt 123.3 kg   LMP 05/27/2023 (Exact Date)   SpO2 97%   BMI 46.65 kg/m  Total I/O In: -  Out: 950 [Urine:950]  FHR baseline 150 bpm, Variability: areas of minimal and moderate, Accelerations:present, Decelerations:  Present Lates, but have since resolved with position changes.  Toco: MVU 190 every 3-4 mins   SVE:   Dilation: 4 Effacement (%): 50 Station: -3 Exam by:: Dr. Hubert Madden  Pitocin @ 6 mu/min  Labs: Lab Results  Component Value Date   WBC 10.1 02/20/2024   HGB 12.4 02/20/2024   HCT 37.4 02/20/2024   MCV 78.1 (L) 02/20/2024   PLT 270 02/20/2024    Assessment / Plan: S/P AROM and cytotec and pit.   Labor: s/p cervical ripening continue to titrate upwards as fetus tolerates it for adequate contractions.  Fetal Wellbeing:  Category II Pain Control:  epidural Pre-eclampsia: bp's stable I/D:  GBS neg Anticipated MOD: hopeful for vaginal birth  Holly [name] MSN, CNM  02/20/2024, 9:29 PM

## 2024-02-20 NOTE — Progress Notes (Signed)
 OB Note Pitocin on 4mU and contracting q17m, not painful, baby still category I to II due to min to mod variabilty Sterile spec used and had to apply ringed forceps to anterior lip to place cook catheter ballon with 60mL in the uterine balloon. Will d/c pit. Can restart at low dose if baby tolerating balloon contractions well  Tyler Gallant MD Attending Center for [employer] (Faculty Practice) 02/20/2024 Time: [phone]

## 2024-02-20 NOTE — Progress Notes (Signed)
    Faculty Practice OB/GYN Attending Note  [name] is a 43 y.o. G2P0010 at [redacted]w[redacted]d admitted for IOL due to [facility], T2DM, EFW 13%: preg c/b h/o CABG, abnormal LV function, hx lap band.  Labor course notable for arrest of cervical dilation in latent phase despite adequate contractions (no change in almost 12 hours) and multiple episodes of Category 2 FHR tracing.  Currently has Category 1 FHR tracing.  Patient was offered continued induction of labor vs cesarean delivery.  After talking to her family and considering all the risks, she wants to proceed with cesarean section.  Risks of surgery re-reviewed, all questions answered.   The patient concurred with the proposed plan, giving informed written consent for the procedure.   Patient has been NPO since today she will remain NPO for procedure. Anesthesia and OR aware. Preoperative prophylactic antibiotics and SCDs ordered on call to the OR.  To OR when ready.   Lenoard Rad, MD, FACOG Obstetrician & Gynecologist, [facility] for [employer], [facility]

## 2024-02-21 ENCOUNTER — Other Ambulatory Visit: Payer: Self-pay

## 2024-02-21 ENCOUNTER — Encounter (HOSPITAL_COMMUNITY): Payer: Self-pay | Admitting: Obstetrics & Gynecology

## 2024-02-21 LAB — COMPREHENSIVE METABOLIC PANEL WITH GFR
ALT: 12 U/L (ref 0–44)
AST: 17 U/L (ref 15–41)
Albumin: 2 g/dL — ABNORMAL LOW (ref 3.5–5.0)
Alkaline Phosphatase: 62 U/L (ref 38–126)
Anion gap: 5 (ref 5–15)
BUN: <5 mg/dL — ABNORMAL LOW (ref 6–20)
CO2: 19 mmol/L — ABNORMAL LOW (ref 22–32)
Calcium: 7.8 mg/dL — ABNORMAL LOW (ref 8.9–10.3)
Chloride: 112 mmol/L — ABNORMAL HIGH (ref 98–111)
Creatinine, Ser: 0.64 mg/dL (ref 0.44–1.00)
GFR, Estimated: >60 mL/min (ref >60)
Glucose, Bld: 114 mg/dL — ABNORMAL HIGH (ref 70–99)
Potassium: 3.4 mmol/L — ABNORMAL LOW (ref 3.5–5.1)
Sodium: 136 mmol/L (ref 135–145)
Total Bilirubin: 0.8 mg/dL (ref 0.0–1.2)
Total Protein: 5 g/dL — ABNORMAL LOW (ref 6.5–8.1)

## 2024-02-21 LAB — GLUCOSE, CAPILLARY
Glucose-Capillary: 103 mg/dL — ABNORMAL HIGH (ref 70–99)
Glucose-Capillary: 122 mg/dL — ABNORMAL HIGH (ref 70–99)
Glucose-Capillary: 123 mg/dL — ABNORMAL HIGH (ref 70–99)
Glucose-Capillary: 144 mg/dL — ABNORMAL HIGH (ref 70–99)
Glucose-Capillary: 147 mg/dL — ABNORMAL HIGH (ref 70–99)
Glucose-Capillary: 150 mg/dL — ABNORMAL HIGH (ref 70–99)
Glucose-Capillary: 156 mg/dL — ABNORMAL HIGH (ref 70–99)

## 2024-02-21 LAB — CBC
HCT: 36.3 % (ref 36.0–46.0)
HCT: 34.4 % — ABNORMAL LOW (ref 36.0–46.0)
Hemoglobin: 12 g/dL (ref 12.0–15.0)
Hemoglobin: 11.7 g/dL — ABNORMAL LOW (ref 12.0–15.0)
MCH: 26.1 pg (ref 26.0–34.0)
MCH: 26.7 pg (ref 26.0–34.0)
MCHC: 33.1 g/dL (ref 30.0–36.0)
MCHC: 34 g/dL (ref 30.0–36.0)
MCV: 79.1 fL — ABNORMAL LOW (ref 80.0–100.0)
MCV: 78.5 fL — ABNORMAL LOW (ref 80.0–100.0)
Platelets: 258 10*3/uL (ref 150–400)
Platelets: 258 10*3/uL (ref 150–400)
RBC: 4.59 MIL/uL (ref 3.87–5.11)
RBC: 4.38 MIL/uL (ref 3.87–5.11)
RDW: 14.6 % (ref 11.5–15.5)
RDW: 14.6 % (ref 11.5–15.5)
WBC: 12.8 10*3/uL — ABNORMAL HIGH (ref 4.0–10.5)
WBC: 12.2 10*3/uL — ABNORMAL HIGH (ref 4.0–10.5)
nRBC: 0 % (ref 0.0–0.2)
nRBC: 0 % (ref 0.0–0.2)

## 2024-02-21 MED ORDER — ENOXAPARIN SODIUM 60 MG/0.6ML IJ SOSY
60.0000 mg | PREFILLED_SYRINGE | INTRAMUSCULAR | Status: DC
  Administered 2024-02-21 – 2024-02-22 (×2): 60 mg via SUBCUTANEOUS
  Filled 2024-02-21 (×2): qty 0.6

## 2024-02-21 MED ORDER — INSULIN ASPART 100 UNIT/ML IJ SOLN: 0.0000 [IU] | Freq: Three times a day (TID) | INTRAMUSCULAR | Status: DC

## 2024-02-21 MED ORDER — VITAMIN B-12 100 MCG PO TABS
100.0000 ug | ORAL_TABLET | Freq: Every day | ORAL | Status: DC
  Administered 2024-02-21 – 2024-02-23 (×3): 100 ug via ORAL
  Filled 2024-02-21 (×3): qty 1

## 2024-02-21 MED ORDER — MENTHOL 3 MG MT LOZG: 1.0000 | LOZENGE | OROMUCOSAL | Status: DC | PRN

## 2024-02-21 MED ORDER — SENNOSIDES-DOCUSATE SODIUM 8.6-50 MG PO TABS
2.0000 | ORAL_TABLET | Freq: Every day | ORAL | Status: DC
  Administered 2024-02-22: 2 via ORAL
  Filled 2024-02-21 (×2): qty 2

## 2024-02-21 MED ORDER — SODIUM CHLORIDE 0.9 % IR SOLN
Status: DC | PRN
  Administered 2024-02-21: 1

## 2024-02-21 MED ORDER — INSULIN GLARGINE-YFGN 100 UNIT/ML ~~LOC~~ SOPN
13.0000 [IU] | PEN_INJECTOR | Freq: Every morning | SUBCUTANEOUS | Status: DC
  Filled 2024-02-21: qty 3

## 2024-02-21 MED ORDER — OXYCODONE HCL 5 MG PO TABS: 5.0000 mg | ORAL_TABLET | ORAL | Status: DC | PRN

## 2024-02-21 MED ORDER — MAGNESIUM HYDROXIDE 400 MG/5ML PO SUSP: 30.0000 mL | ORAL | Status: DC | PRN

## 2024-02-21 MED ORDER — FUROSEMIDE 20 MG PO TABS
20.0000 mg | ORAL_TABLET | Freq: Every day | ORAL | Status: DC
  Administered 2024-02-21 – 2024-02-23 (×3): 20 mg via ORAL
  Filled 2024-02-21 (×3): qty 1

## 2024-02-21 MED ORDER — TETANUS-DIPHTH-ACELL PERTUSSIS 5-2.5-18.5 LF-MCG/0.5 IM SUSY: 0.5000 mL | PREFILLED_SYRINGE | Freq: Once | INTRAMUSCULAR | Status: DC

## 2024-02-21 MED ORDER — SODIUM CHLORIDE 0.9% FLUSH
3.0000 mL | Freq: Two times a day (BID) | INTRAVENOUS | Status: DC
  Administered 2024-02-21: 5 mL via INTRAVENOUS
  Administered 2024-02-22: 3 mL via INTRAVENOUS

## 2024-02-21 MED ORDER — KETOROLAC TROMETHAMINE 30 MG/ML IJ SOLN
30.0000 mg | Freq: Four times a day (QID) | INTRAMUSCULAR | Status: AC
  Administered 2024-02-21 (×3): 30 mg via INTRAVENOUS
  Filled 2024-02-21 (×4): qty 1

## 2024-02-21 MED ORDER — INSULIN ASPART 100 UNIT/ML IJ SOLN
0.0000 [IU] | Freq: Three times a day (TID) | INTRAMUSCULAR | Status: DC
  Administered 2024-02-21 – 2024-02-23 (×3): 1 [IU] via SUBCUTANEOUS

## 2024-02-21 MED ORDER — OXYCODONE HCL 5 MG PO TABS: 5.0000 mg | ORAL_TABLET | Freq: Once | ORAL | Status: DC | PRN

## 2024-02-21 MED ORDER — SODIUM CHLORIDE 0.9% FLUSH: 3.0000 mL | INTRAVENOUS | Status: DC | PRN

## 2024-02-21 MED ORDER — FERROUS SULFATE 325 (65 FE) MG PO TABS
325.0000 mg | ORAL_TABLET | ORAL | Status: DC
  Administered 2024-02-22: 325 mg via ORAL
  Filled 2024-02-21: qty 1

## 2024-02-21 MED ORDER — FENTANYL CITRATE (PF) 100 MCG/2ML IJ SOLN: 25.0000 ug | INTRAMUSCULAR | Status: DC | PRN

## 2024-02-21 MED ORDER — ZOLPIDEM TARTRATE 5 MG PO TABS: 5.0000 mg | ORAL_TABLET | Freq: Every evening | ORAL | Status: DC | PRN

## 2024-02-21 MED ORDER — GABAPENTIN 300 MG PO CAPS
300.0000 mg | ORAL_CAPSULE | Freq: Two times a day (BID) | ORAL | Status: DC
  Administered 2024-02-21 – 2024-02-23 (×6): 300 mg via ORAL
  Filled 2024-02-21 (×6): qty 1

## 2024-02-21 MED ORDER — INSULIN GLARGINE-YFGN 100 UNIT/ML ~~LOC~~ SOLN: 13.0000 [IU] | Freq: Every morning | SUBCUTANEOUS | Status: DC

## 2024-02-21 MED ORDER — METFORMIN HCL 500 MG PO TABS
500.0000 mg | ORAL_TABLET | Freq: Two times a day (BID) | ORAL | Status: DC
  Filled 2024-02-21: qty 1

## 2024-02-21 MED ORDER — COMPLETENATE 29-1 MG PO CHEW
1.0000 | CHEWABLE_TABLET | Freq: Every day | ORAL | Status: DC
  Administered 2024-02-21 – 2024-02-23 (×2): 1 via ORAL
  Filled 2024-02-21 (×2): qty 1

## 2024-02-21 MED ORDER — INSULIN GLARGINE-YFGN 100 UNIT/ML ~~LOC~~ SOLN
13.0000 [IU] | Freq: Every morning | SUBCUTANEOUS | Status: DC
  Filled 2024-02-21: qty 0.13

## 2024-02-21 MED ORDER — INSULIN GLARGINE-YFGN 100 UNIT/ML ~~LOC~~ SOLN
10.0000 [IU] | Freq: Every day | SUBCUTANEOUS | Status: DC
  Administered 2024-02-21: 10 [IU] via SUBCUTANEOUS
  Filled 2024-02-21 (×2): qty 0.1

## 2024-02-21 MED ORDER — COCONUT OIL OIL: 1.0000 | TOPICAL_OIL | Status: DC | PRN

## 2024-02-21 MED ORDER — KETOROLAC TROMETHAMINE 30 MG/ML IJ SOLN
30.0000 mg | Freq: Once | INTRAMUSCULAR | Status: AC | PRN
  Administered 2024-02-21: 30 mg via INTRAVENOUS

## 2024-02-21 MED ORDER — KETOROLAC TROMETHAMINE 30 MG/ML IJ SOLN
INTRAMUSCULAR | Status: AC
Start: 2024-02-21 — End: ?
  Filled 2024-02-21: qty 1

## 2024-02-21 MED ORDER — DIPHENHYDRAMINE HCL 25 MG PO CAPS: 25.0000 mg | ORAL_CAPSULE | Freq: Four times a day (QID) | ORAL | Status: DC | PRN

## 2024-02-21 MED ORDER — ACETAMINOPHEN 500 MG PO TABS
1000.0000 mg | ORAL_TABLET | Freq: Four times a day (QID) | ORAL | Status: DC
  Administered 2024-02-21 – 2024-02-22 (×7): 1000 mg via ORAL
  Filled 2024-02-21 (×10): qty 2

## 2024-02-21 MED ORDER — DIBUCAINE (PERIANAL) 1 % EX OINT: 1.0000 | TOPICAL_OINTMENT | CUTANEOUS | Status: DC | PRN

## 2024-02-21 MED ORDER — IBUPROFEN 600 MG PO TABS
600.0000 mg | ORAL_TABLET | Freq: Four times a day (QID) | ORAL | Status: DC
  Filled 2024-02-21: qty 1

## 2024-02-21 MED ORDER — SIMETHICONE 80 MG PO CHEW
80.0000 mg | CHEWABLE_TABLET | ORAL | Status: DC | PRN
  Administered 2024-02-22: 80 mg via ORAL
  Filled 2024-02-21: qty 1

## 2024-02-21 MED ORDER — POTASSIUM CHLORIDE 20 MEQ PO PACK
20.0000 meq | PACK | Freq: Two times a day (BID) | ORAL | Status: AC
  Administered 2024-02-21 (×2): 20 meq via ORAL
  Filled 2024-02-21 (×2): qty 1

## 2024-02-21 MED ORDER — POTASSIUM CHLORIDE 20 MEQ PO PACK
20.0000 meq | PACK | Freq: Every day | ORAL | Status: AC
  Administered 2024-02-22 – 2024-02-23 (×2): 20 meq via ORAL
  Filled 2024-02-21 (×2): qty 1

## 2024-02-21 MED ORDER — MEASLES, MUMPS & RUBELLA VAC IJ SOLR: 0.5000 mL | Freq: Once | INTRAMUSCULAR | Status: DC

## 2024-02-21 MED ORDER — WITCH HAZEL-GLYCERIN EX PADS: 1.0000 | MEDICATED_PAD | CUTANEOUS | Status: DC | PRN

## 2024-02-21 MED ORDER — INSULIN ASPART 100 UNIT/ML FLEXPEN: 3.0000 [IU] | PEN_INJECTOR | Freq: Three times a day (TID) | SUBCUTANEOUS | Status: DC

## 2024-02-21 MED ORDER — POTASSIUM CHLORIDE CRYS ER 20 MEQ PO TBCR: 20.0000 meq | EXTENDED_RELEASE_TABLET | Freq: Every day | ORAL | Status: DC

## 2024-02-21 MED ORDER — HYDROMORPHONE HCL 2 MG PO TABS: 2.0000 mg | ORAL_TABLET | ORAL | Status: DC | PRN

## 2024-02-21 MED ORDER — POTASSIUM CHLORIDE CRYS ER 20 MEQ PO TBCR
20.0000 meq | EXTENDED_RELEASE_TABLET | Freq: Two times a day (BID) | ORAL | Status: DC
  Filled 2024-02-21: qty 1

## 2024-02-21 MED ORDER — OXYCODONE HCL 5 MG/5ML PO SOLN: 5.0000 mg | Freq: Once | ORAL | Status: DC | PRN

## 2024-02-21 MED ORDER — PRENATAL MULTIVITAMIN CH
1.0000 | ORAL_TABLET | Freq: Every day | ORAL | Status: DC
  Filled 2024-02-21: qty 1

## 2024-02-21 MED ORDER — OXYTOCIN-SODIUM CHLORIDE 30-0.9 UT/500ML-% IV SOLN
2.5000 [IU]/h | INTRAVENOUS | Status: AC
  Administered 2024-02-21: 2.5 [IU]/h via INTRAVENOUS
  Filled 2024-02-21: qty 500

## 2024-02-21 NOTE — Anesthesia Postprocedure Evaluation (Signed)
 Anesthesia Post Note  Patient: [name]  Procedure(s) Performed: CESAREAN DELIVERY     Patient location during evaluation: PACU Anesthesia Type: Epidural Level of consciousness: oriented and awake and alert Pain management: pain level controlled Vital Signs Assessment: post-procedure vital signs reviewed and stable Respiratory status: spontaneous breathing, respiratory function stable and patient connected to nasal cannula oxygen Cardiovascular status: blood pressure returned to baseline and stable Postop Assessment: no headache, no backache, no apparent nausea or vomiting and epidural receding Anesthetic complications: no  No notable events documented.  Last Vitals:  Vitals:   02/21/24 0221 02/21/24 0327  BP: 106/64 120/68  Pulse: 72 69  Resp: 16 16  Temp: 36.8 C 36.7 C  SpO2: 100% 100%    Last Pain:  Vitals:   02/21/24 0328  TempSrc:   PainSc: 0-No pain   Pain Goal:                   Ajeet Casasola L Williette Loewe

## 2024-02-21 NOTE — Op Note (Addendum)
 [name] PROCEDURE DATE: 02/21/2024  PREOPERATIVE DIAGNOSES: Intrauterine pregnancy at [redacted]w[redacted]d weeks gestation; failed induction of labor secondary to chronic hypertension and Type 2 diabetes;  failure to progress: arrest of dilation; repetitive episodes of Category 2 fetal heart rate tracing; fetus small for gestational age; morbid obesity; history of STEMI and CABG in 2018  POSTOPERATIVE DIAGNOSES: The same  PROCEDURE: Low Transverse Cesarean Section  SURGEON:  Dr. Lenoard Rad  ANESTHESIOLOGY TEAM: Anesthesiologist: Conard Decent, MD; Grace Laura, MD  INDICATIONS: [name] is a 43 y.o. G2P1011 at [redacted]w[redacted]d here for cesarean section secondary to the indications listed under preoperative diagnoses; please see preoperative note for further details.  The risks of surgery were discussed with the patient including but were not limited to: bleeding which may require transfusion or reoperation; infection which may require antibiotics; injury to bowel, bladder, ureters or other surrounding organs; injury to the fetus; need for additional procedures including hysterectomy in the event of a life-threatening hemorrhage; formation of adhesions; placental abnormalities wth subsequent pregnancies; incisional problems; thromboembolic phenomenon and other postoperative/anesthesia complications.  The patient concurred with the proposed plan, giving informed written consent for the procedure.    FINDINGS:  Viable female infant in cephalic presentation.  Bord cord x 3, very thin cord noted with minimal Wharton's jelly. Apgars 9 and 9, weight 2270 g.  Clear amniotic fluid.  Intact placenta, three vessel cord.  Normal uterus, fallopian tubes and ovaries bilaterally.  Prevena placed over incision at the end of the procedure.  ANESTHESIA: Epidural  ESTIMATED BLOOD LOSS: 294 ml SPECIMENS: Placenta sent to L&D COMPLICATIONS: None immediate  PROCEDURE IN DETAIL:  The patient  preoperatively received intravenous antibiotics and had sequential compression devices applied to her lower extremities.  She was then taken to the operating room where the epidural anesthesia was dosed up to surgical level and was found to be adequate. She was then placed in a dorsal supine position with a leftward tilt, and prepped and draped in a sterile manner.  A foley catheter was placed into her bladder and attached to constant gravity.  After an adequate timeout was performed, a Pfannenstiel skin incision was made with scalpel and carried through to the underlying layer of fascia.  The fascia was incised in the midline, and this incision was extended bilaterally in a blunt fashion.  The underlying rectus muscles were dissected off the fascia superiorly and inferiorly in a blunt fashion. The rectus muscles were separated in the midline and the peritoneum was entered bluntly. The Alexis self-retaining retractor was introduced into the abdominal cavity.  Attention was turned to the lower uterine segment where a low transverse hysterotomy was made with a scalpel and extended bilaterally bluntly.  The infant was successfully delivered, the cord was clamped and cut after one minute, and the infant was handed over to the awaiting neonatology team. Uterine massage was then administered, and the placenta delivered intact with a three-vessel cord. The uterus was then cleared of clots and debris.  The hysterotomy was closed with 0 Vicryl in a running locked fashion, and an imbricating layer was also placed with 0 Vicryl.  Figure-of-eight 0 Vicryl serosal stitches were placed to help with hemostasis.  The pelvis was cleared of all clot and debris. Hemostasis was confirmed on all surfaces.  The retractor was removed.  The peritoneum was closed with a 0 Vicryl running stitch  The fascia was then closed using 0 PDS in a running fashion.  The subcutaneous layer  was irrigated, reapproximated with 0 Vicryl interrupted  stitches, and the skin was closed with a 4-0 Vicryl subcuticular stitch. After the skin was closed, a Prevena disposable negative pressure wound therapy device was placed over the incision.  The suction was activated at a pressure of -125 mmHg.  The adhesive was affixed well and there were no leaks noted.  The patient tolerated the procedure well. Sponge, instrument and needle counts were correct x 3.  She was taken to the recovery room in stable condition.    Lenoard Rad, MD, FACOG Obstetrician & Gynecologist, [facility] for [employer], [facility]

## 2024-02-21 NOTE — Discharge Summary (Shared)
 Postpartum Discharge Summary  Date of Service updated***     Patient Name: [name] DOB: 1980/10/09 MRN: [id number]  Date of admission: 02/19/2024 Delivery date:02/20/2024 Delivering provider: Lenoard Rad A Date of discharge: 02/21/2024  Admitting diagnosis: Preexisting hypertension complicating pregnancy, antepartum, third trimester [O10.913] Intrauterine pregnancy: [redacted]w[redacted]d     Secondary diagnosis:  Principal Problem:   Preexisting hypertension complicating pregnancy, antepartum, third trimester Active Problems:   History of ST elevation myocardial infarction   Essential hypertension   Preexisting diabetes complicating pregnancy, antepartum   AMA (advanced maternal age) 34+   Maternal obesity affecting pregnancy, antepartum   Coronary artery disease  Additional problems: None    Discharge diagnosis: Term Pregnancy Delivered, CHTN, and Type 2 DM                                              Post partum procedures:None Augmentation: AROM, Pitocin, and Cytotec Complications: None  Hospital course: Induction of Labor With Cesarean Section   43 y.o. yo G2P1011 at [redacted]w[redacted]d was admitted to the hospital 02/19/2024 for induction of labor CHTN, T2DM, EFW 13%.  Pregnancy complicated by history of STEMI and CABG in 2018, abnormal LV function, hx lap band.  Patient had a labor course  notable for arrest of cervical dilation in latent phase despite adequate contractions (no change in almost 12 hours) and multiple episodes of Category 2 FHR tracing. The patient went for cesarean section due to Arrest of Dilation and Non-Reassuring FHR. Delivery details are as follows: Membrane Rupture Time/Date: 6:00 PM,02/20/2024  Delivery Method:C-Section, Low Transverse Details of operation can be found in separate operative note, Prevena was placed over incision at the end of the procedure.  Patient had an uncomplicated postpartum course ***complicated by***.  Her CHTN was managed on her Amliodpine and  Metoprolol  XL, and she was started on postpartum Lasix  and potassium supplementation.  Her pre-pregnancy insulin  regimen was restarted for management of her T2DM.  She is ambulating, tolerating a regular diet, passing flatus, and urinating well.  Patient is discharged home in stable condition on 02/21/24.      Newborn Data: Birth date:02/20/2024 Birth time:11:44 PM Gender:Female Living status:Living Apgars:9 ,9  Weight:2270 g                               Magnesium  Sulfate received: {Mag received:30440022} BMZ received: {BMZ received:30440023} Rhophylac:{Rhophylac received:30440032} MMR:N/A T-DaP:Declined Flu: No RSV Vaccine received: No Transfusion:No  Immunizations received: Immunization History  Administered Date(s) Administered   Influenza, Quadrivalent, Recombinant, Inj, Pf 07/14/2018   Influenza,inj,Quad PF,6+ Mos 06/20/2019, 10/29/2021    Physical exam  Vitals:   02/21/24 0058 02/21/24 0116 02/21/24 0119 02/21/24 0130  BP: 112/61 (!) 101/44 113/60 (!) 118/53  Pulse:  83 78 79  Resp: 19 18  19   Temp:    98.2 F (36.8 C)  TempSrc:    Oral  SpO2:  100% 100% 100%  Weight:      Height:       General: {Exam; general:21111117} Lochia: {Desc; appropriate/inappropriate:30686::"appropriate"} Uterine Fundus: {Desc; firm/soft:30687} Incision: {Exam; incision:21111123} DVT Evaluation: {Exam; dvt:2111122} Labs: Lab Results  Component Value Date   WBC 10.1 02/20/2024   HGB 12.4 02/20/2024   HCT 37.4 02/20/2024   MCV 78.1 (L) 02/20/2024   PLT 270 02/20/2024      Latest  Ref Rng & Units 02/20/2024    9:18 AM  CMP  Glucose 70 - 99 mg/dL 86   BUN 6 - 20 mg/dL 7   Creatinine 1.61 - 0.96 mg/dL 0.45   Sodium 409 - 811 mmol/L 134   Potassium 3.5 - 5.1 mmol/L 3.7   Chloride 98 - 111 mmol/L 107   CO2 22 - 32 mmol/L 18   Calcium  8.9 - 10.3 mg/dL 8.0   Total Protein 6.5 - 8.1 g/dL 5.8   Total Bilirubin 0.0 - 1.2 mg/dL 0.9   Alkaline Phos 38 - 126 U/L 69   AST 15 - 41 U/L 20    ALT 0 - 44 U/L 15    Edinburgh Score:     No data to display         No data recorded  After visit meds:  Allergies as of 02/21/2024   No Known Allergies   Med Rec must be completed prior to using this [facility]***      Discharge home in stable condition Infant Feeding: Bottle and Breast Infant Disposition:{CHL IP OB HOME WITH BJYNWG:95621} Discharge instruction: per After Visit Summary and Postpartum booklet. Activity: Advance as tolerated. Pelvic rest for 6 weeks.  Diet: {OB HYQM:57846962}  Future Appointments  Date Time Provider Department Center  03/02/2024  4:00 PM Tobb, Kardie, DO [facility] None   Follow up Visit:  Message sent to [facility] on 02/21/24 morning  Please schedule this patient for a In person postpartum visit in 4 weeks with the following provider: MD. Additional Postpartum F/U:Incision check, Prevena Removal and BP check 1 week  High risk pregnancy complicated by: CHTN, T2DM, history of STEMI and CABG Delivery mode:  C-Section, Low Transverse Anticipated Birth Control: Considering Mirena  IUD   02/21/2024 1:44 AM Maurie [facility], CNM

## 2024-02-21 NOTE — Progress Notes (Signed)
 This RN called to desk by Diplomatic Services operational officer. Secretary witnessed pt trying to leave room tearful, while support person trying to get her to stay in room and holding on to pt's IV pole.  After a couple of minutes when entering room pt standing at bed tearful. This RN asked pt if she wanted to walk in hallway. When asking pt why she was upset, pt denies pain and would not verbalize what was wrong. Pt then walked in hallway with Aunt  while support person sat on couch. This RN stayed and worked on feedings with newborn.

## 2024-02-21 NOTE — Lactation Note (Signed)
 This note was copied from a baby's chart. Lactation Consultation Note  Patient Name: [name] Today's Date: 02/21/2024 Age:44 hours Reason for consult: Initial assessment;Primapara;1st time breastfeeding;Early term 37-38.6wks;Infant < 6lbs;Maternal endocrine disorder;Breastfeeding assistance  P1- The NAN requested for LC to assist with latching infant in the PACU because infant had not fed yet and [name] has T2DM. LC attempted to hand express both breasts, but was unable to collect even a drop of colostrum. LC placed infant on the right breast in the side lying position. [name] has moderate areola edema, but with reverse pressuring it does soften. Infant latched immediatly and had a strong rhythmic suck. Infant nursed for 10 minutes before having to unlatch due to being moved to [facility]. [name]/FOB decline offering supplementation. LC reviewed the importance and need for stimulation due to [name]'s T2DM and infant low birth weight of 2270g. LC also reviewed the importance of [name] pumping every 3 hrs when she is admitted to the [facility]. [name] agreed to offering formula, but only if it is her goats milk formula that is at home. LC reviewed the first 24 hr birthday nap, feeding infant on cue 8-12x in 24 hrs, not allowing infant to go over 3 hrs without a feeding and CDC milk storage guidelines. LC encouraged [name] to call for further assistance as needed.  Maternal Data Has patient been taught Hand Expression?: Yes Does the patient have breastfeeding experience prior to this delivery?: No  Feeding Mother's Current Feeding Choice: Breast Milk  LATCH Score Latch: Grasps breast easily, tongue down, lips flanged, rhythmical sucking.  Audible Swallowing: None  Type of Nipple: Flat  Comfort (Breast/Nipple): Soft / non-tender  Hold (Positioning): Full assist, staff holds infant at breast  LATCH Score: 5   Interventions Interventions: Breast feeding basics reviewed;Assisted with latch;Hand express;Breast  compression;Adjust position;Support pillows;Position options;Education;LC Services brochure  Discharge Discharge Education: Engorgement and breast care;Warning signs for feeding baby Pump: DEBP;Personal  Consult Status Consult Status: Follow-up Date: 02/22/24 Follow-up type: In-patient    Vernette Goo BS, IBCLC 02/21/2024, 2:25 AM

## 2024-02-21 NOTE — Progress Notes (Signed)
 POSTPARTUM PROGRESS NOTE  To bedside to discussed diabetes treatment. Patient would prefer insulin  vs metformin. Sensitive SSI ordered.  Maud Sorenson, MD OB Fellow 02/21/2024 1:52 PM

## 2024-02-21 NOTE — Progress Notes (Signed)
 Post OP Day 1 Subjective: no complaints, tolerating PO, and patient starting to get more feeling in her legs.   Objective: Blood pressure 111/66, pulse 69, temperature 98 F (36.7 C), temperature source Oral, resp. rate 18, height 5\' 4"  (1.626 m), weight 123.3 kg, last menstrual period 05/27/2023, SpO2 100%, unknown if currently breastfeeding.  Physical Exam:  General: alert, cooperative, and appears stated age Lochia: appropriate Uterine Fundus: firm Incision: covered by Provena, dressing clean dry and intact.  DVT Evaluation: No evidence of DVT seen on physical exam. Negative Homan's sign. SCDs present.   Recent Labs    02/21/24 0146 02/21/24 0437  HGB 12.0 11.7*  HCT 36.3 34.4*    Assessment/Plan: Breastfeeding and Lactation consult. Patient concerned for breast milk coming in.  - Recommended when she is able to get up to, move often to aid in gut motility.    LOS: 2 days   [name], CNM 02/21/2024, 7:22 AM

## 2024-02-21 NOTE — Inpatient Diabetes Management (Addendum)
 Inpatient Diabetes Program Recommendations  ADA Standards of Care 2023 Diabetes in Pregnancy Target Glucose Ranges:  Fasting: 70 - 95 mg/dL 1 hr postprandial:  409 - 140mg /dL (from first bite of meal) 2 hr postprandial:  100 - 120 mg/dL (from first bit of meal)    Lab Results  Component Value Date   GLUCAP 123 (H) 02/21/2024   HGBA1C 5.6 12/22/2023    Review of Glycemic Control  Latest Reference Range & Units 02/21/24 04:02  Glucose-Capillary 70 - 99 mg/dL 811 (H)  (H): Data is abnormally high  Diabetes history: DM2 Outpatient Diabetes medications: Lantus 17 units QAM, 12 units at bedtime, Novolog  8-12 units TID, Dexcom Prior to pregnancy-Mounjaro  only Current orders for Inpatient glycemic control: Semglee 10 units at bedtime, 13 units QAM, Novolog  0-20 units TID  Inpatient Diabetes Program Recommendations:    Received Semglee 10 units this morning at 04:16.  Semglee 13 units is scheduled for 10:00.  (please hold this dose).     Please decrease correction to Novolog  0-9 units TID.    Will speak with patient today regarding DM management prior to pregnancy.    Addendum-10:51:  Spoke with patient at bedside.  Prior to pregnancy she was only taking Mounjaro  for her DM.  Might consider adding Metformin 500 mg BID for discharge and follow up with her PCP/OB for glucose trends.  Mounjaro  is contridicated while breast feeding.  Encouraged her to avoid caloric beverages and be mindful of carbohydrates and portion sizes.    Thank you, Hays Lipschutz, MSN, CDCES Diabetes Coordinator Inpatient Diabetes Program [phone] (team pager from 8a-5p)

## 2024-02-21 NOTE — Transfer of Care (Addendum)
 Immediate Anesthesia Transfer of Care Note  Patient: [name]  Procedure(s) Performed: CESAREAN DELIVERY  Patient Location: PACU  Anesthesia Type:Epidural  Level of Consciousness: awake, alert , and oriented  Airway & Oxygen Therapy: Patient Spontanous Breathing  Post-op Assessment: Report given to RN and Post -op Vital signs reviewed and stable  Post vital signs: Reviewed and stable  Last Vitals:  Vitals Value Taken Time  BP 105/47 02/21/24 0100  Temp    Pulse 82 02/21/24 0100  Resp 18 02/21/24 0101  SpO2 100 % 02/21/24 0100  Vitals shown include unfiled device data.  Last Pain:  Vitals:   02/20/24 2100  TempSrc: Oral  PainSc:          Complications: No notable events documented.

## 2024-02-22 ENCOUNTER — Encounter: Admitting: Obstetrics and Gynecology

## 2024-02-22 LAB — GLUCOSE, CAPILLARY
Glucose-Capillary: 77 mg/dL (ref 70–99)
Glucose-Capillary: 105 mg/dL — ABNORMAL HIGH (ref 70–99)

## 2024-02-22 MED ORDER — INSULIN GLARGINE-YFGN 100 UNIT/ML ~~LOC~~ SOLN
10.0000 [IU] | Freq: Every day | SUBCUTANEOUS | Status: DC
  Administered 2024-02-22 – 2024-02-23 (×2): 10 [IU] via SUBCUTANEOUS
  Filled 2024-02-22 (×2): qty 0.1

## 2024-02-22 MED ORDER — IBUPROFEN 100 MG/5ML PO SUSP
600.0000 mg | Freq: Four times a day (QID) | ORAL | Status: DC
  Administered 2024-02-22 – 2024-02-23 (×5): 600 mg via ORAL
  Filled 2024-02-22 (×5): qty 30

## 2024-02-22 NOTE — Progress Notes (Signed)
 POSTPARTUM PROGRESS NOTE  Subjective: [name] is a 43 y.o. G2P1011 s/p pLTCS at [redacted]w[redacted]d.  She reports she is doing well. No acute events overnight. She denies any problems with ambulating, voiding or po intake. Denies nausea or vomiting. She has passed flatus. Pain is well controlled.  Lochia is light.  Objective: Blood pressure 119/68, pulse 67, temperature 98.2 F (36.8 C), temperature source Oral, resp. rate 18, height 5\' 4"  (1.626 m), weight 123.3 kg, last menstrual period 05/27/2023, SpO2 100%, unknown if currently breastfeeding.  Physical Exam:  General: alert, cooperative and no distress Chest: no respiratory distress, vertical well-healed incision Abdomen: soft, non-tender  Uterine Fundus: firm and at level of umbilicus Extremities: No calf swelling or tenderness  trace edema  Recent Labs    02/21/24 0146 02/21/24 0437  HGB 12.0 11.7*  HCT 36.3 34.4*    Assessment/Plan: [name] is a 43 y.o. G2P1011 s/p pLTCS at [redacted]w[redacted]d.  Routine Postpartum Care: Doing well, pain well-controlled.  -- Continue routine care, lactation support  -- Contraception: OP IUD -- Feeding: both  -- cHTN: continue norvasc  2.5, toprol  25, lasix /K. Normotensive -- T2DM: Semglee 10 U every day. Sensitive sliding scale. Expect discharge on Lantus 10 U every day. Has follow-up later this month with endocrinologist for further management  Dispo: Plan for discharge 6/5.  Maud Sorenson, MD OB Fellow 02/22/2024 11:17 AM

## 2024-02-22 NOTE — Lactation Note (Signed)
 This note was copied from a baby's chart. Lactation Consultation Note  Patient Name: [name] [id number] Date: 02/22/2024 Age:44 hours Reason for consult: Follow-up assessment;1st time breastfeeding;Early term 37-38.6wks  P1, Baby [redacted]w[redacted]d.  Mother is pumping and offering formula. RN set up DEBP.  Offered to fit flanges and mother stated she felt they were comfortable.   Encouraged mother to pump q 3 hours for 15 min to help establish her milk supply. Baby recently consumed 17 ml of formula.  Suggest calling if she would like assistance with latching. Mother has her personal DEBP in room.   Maternal Data Has patient been taught Hand Expression?: Yes Does the patient have breastfeeding experience prior to this delivery?: No  Feeding Mother's Current Feeding Choice: Breast Milk Nipple Type: Dr. Ramond Burnet  Lactation Tools Discussed/Used Tools: Pump Breast pump type: Double-Electric Breast Pump Reason for Pumping: Mother's choice Pumping frequency: q 3 hours for 15 min.  Interventions Interventions: DEBP;Education  Discharge Pump: Personal;DEBP (Medela)  Consult Status Consult Status: Follow-up Date: 02/23/24 Follow-up type: In-patient  Luellen Sages  RN, IBCLC 02/22/2024, 3:23 PM

## 2024-02-22 NOTE — Progress Notes (Signed)
 Patient told to call out for glucose checks before meals.

## 2024-02-22 NOTE — Lactation Note (Signed)
 This note was copied from a baby's chart. Lactation Consultation Note  Patient Name: [name] [id number]'Z Date: 02/22/2024 Age:44 hours  Reason for consult: Mother's request;Follow-up assessment;1st time breastfeeding;Infant < 6lbs;Early term 37-38.6wks;Infant weight loss (See [name]: MR- T2DM, AMA, C/S and CHTN. Infant with weight loss of -5.01%).  [name] latched infant on her left breast with pillow support using the football hold position, infant briefly latched for 2 minutes and then stopped. Afterwards infant was pace feed 25 mls of 22 kcal Kendamil formula using White Nfant nipple. LC reviewed with [name] how to use the DEBP and importance of pumping to help stimulate and establish her milk supply as she works towards latching infant at  the breast.   [name] knows that her EBM is safe for 4 hours at room temperature whereas formula once prepared must be used within 1 hour.  [name] current feeding plan : LBW/ LPTI feeding guidelines 1- [name] will attempt to latch infant first every feeding, will limit breastfeed to 10 minutes or less and then will supplement infant with any EBM first/ then formula. [name] knows on Day 2 to supplement infant with  ( 18-21 mls) per feeding or more if infant wants it.  2- [name] knows to feed infant every 3 hours and limit breast and bottle feeding to 30 minutes or less. 3- [name] knows to call for further latch assistance if needed. 4- [name] will continue to use the DEBP every 3 hours for 15 minutes.   Maternal Data Has patient been taught Hand Expression?: Yes Does the patient have breastfeeding experience prior to this delivery?: No  Feeding Mother's Current Feeding Choice: Breast Milk and Formula  LATCH Score Latch: Grasps breast easily, tongue down, lips flanged, rhythmical sucking.  Audible Swallowing: A few with stimulation  Type of Nipple: Everted at rest and after stimulation  Comfort (Breast/Nipple): Soft / non-tender  Hold (Positioning): Assistance needed  to correctly position infant at breast and maintain latch.  LATCH Score: 8   Lactation Tools Discussed/Used Tools: Pump;Flanges Flange Size: 21 (LC re-fitted [name] flange size) Breast pump type: Double-Electric Breast Pump Pump Education: Setup, frequency, and cleaning;Milk Storage Reason for Pumping: Infant is ETI and less than 5 lbs 8 ounces Pumping frequency: [name] will continue to use the DEBP every 3 hours for 15 minutes.  Interventions Interventions: Assisted with latch;Skin to skin;Breast compression;Adjust position;Support pillows;Position options;Expressed milk;Education;LC Services brochure;Guidelines for Milk Supply and Pumping Schedule Handout;CDC milk storage guidelines;LPT handout/interventions;CDC Guidelines for Breast Pump Cleaning;Pace feeding  Discharge Pump: DEBP;Personal;Manual ([name] has Medela DEBP.)  Consult Status Consult Status: Follow-up Date: 02/23/24 Follow-up type: In-patient    Pecolia Bourbon 02/22/2024, 11:43 PM

## 2024-02-23 ENCOUNTER — Other Ambulatory Visit (HOSPITAL_COMMUNITY): Payer: Self-pay

## 2024-02-23 LAB — GLUCOSE, CAPILLARY
Glucose-Capillary: 108 mg/dL — ABNORMAL HIGH (ref 70–99)
Glucose-Capillary: 132 mg/dL — ABNORMAL HIGH (ref 70–99)

## 2024-02-23 MED ORDER — DIBUCAINE (PERIANAL) 1 % EX OINT
1.0000 | TOPICAL_OINTMENT | CUTANEOUS | 0 refills | Status: AC | PRN
  Filled 2024-02-23: qty 10, 30d supply, fill #0

## 2024-02-23 MED ORDER — FUROSEMIDE 20 MG PO TABS
20.0000 mg | ORAL_TABLET | Freq: Every day | ORAL | 0 refills | Status: DC
  Filled 2024-02-23: qty 2, 2d supply, fill #0

## 2024-02-23 MED ORDER — LANTUS SOLOSTAR 100 UNIT/ML ~~LOC~~ SOPN: 10.0000 [IU] | PEN_INJECTOR | Freq: Every day | SUBCUTANEOUS | Status: AC

## 2024-02-23 MED ORDER — AMLODIPINE BESYLATE 10 MG PO TABS
10.0000 mg | ORAL_TABLET | Freq: Every day | ORAL | 2 refills | Status: DC
  Filled 2024-02-23: qty 60, 60d supply, fill #0

## 2024-02-23 MED ORDER — AMLODIPINE BESYLATE 5 MG PO TABS: 10.0000 mg | ORAL_TABLET | Freq: Every day | ORAL | Status: DC

## 2024-02-23 MED ORDER — FERROUS SULFATE 325 (65 FE) MG PO TABS
325.0000 mg | ORAL_TABLET | ORAL | 0 refills | Status: AC
  Filled 2024-02-23: qty 15, 30d supply, fill #0

## 2024-02-23 MED ORDER — AMLODIPINE BESYLATE 5 MG PO TABS
10.0000 mg | ORAL_TABLET | Freq: Every day | ORAL | Status: DC
  Administered 2024-02-23: 10 mg via ORAL
  Filled 2024-02-23: qty 2

## 2024-02-23 MED ORDER — WITCH HAZEL-GLYCERIN EX PADS
1.0000 | MEDICATED_PAD | CUTANEOUS | 12 refills | Status: AC | PRN
  Filled 2024-02-23: qty 40, 40d supply, fill #0

## 2024-02-23 NOTE — Social Work (Signed)
 [name] was referred for history of depression/anxiety.  * Referral screened out by Clinical Social Worker because none of the following criteria appear to apply:  ~ History of anxiety/depression during this pregnancy, or of post-partum depression following prior delivery.  ~ Diagnosis of anxiety and/or depression within last 3 years OR * [name]'s symptoms currently being treated with medication and/or therapy.  Per chart review [name]'s diagnosis dates back to 2019, Per OB records no MH concerns noted during this pregnancy, Edinburgh=2.   Please contact the Clinical Social Worker if needs arise, or by [name] request.   Haroldine Likens, LCSWA Clinical Social Worker [phone]

## 2024-02-23 NOTE — Progress Notes (Shared)
 POSTPARTUM PROGRESS NOTE  Post Partum Day 3  Subjective:  [name] is a 43 y.o. G2P1011 s/p c/s at [redacted]w[redacted]d.  No acute events overnight.  Pt denies problems with ambulating, voiding or po intake.  She denies nausea or vomiting.  Pain is well controlled.  She has had flatus. She has had bowel movement.  Lochia Minimal.   Objective: Blood pressure (!) 136/94, pulse 68, temperature 97.6 F (36.4 C), temperature source Oral, resp. rate 17, height 5\' 4"  (1.626 m), weight 123.3 kg, last menstrual period 05/27/2023, SpO2 98%, unknown if currently breastfeeding.  Physical Exam:  General: alert, cooperative and no distress Chest: no respiratory distress Heart:regular rate, distal pulses intact Abdomen: soft, nontender,  Uterine Fundus: firm, appropriately tender DVT Evaluation: No calf swelling or tenderness Extremities: no edema Skin: warm, dry; incision clean/dry/intact  Recent Labs    02/21/24 0146 02/21/24 0437  HGB 12.0 11.7*  HCT 36.3 34.4*    Assessment/Plan: [name] is a 43 y.o. G2P1011 s/p caesarian section at [redacted]w[redacted]d   POD#3 - Doing well Contraception: outpatient iud  Feeding: both Dispo: Plan for discharge 6/5.  cHTN: increased amlodipine  to 10mg , continue toprol  25, lasix /K. Normotensive T2DM: Semglee 10 U every day. Sensitive sliding scale. Expect discharge on Lantus 10 U every day. Has follow-up later this month with endocrinologist for further management   LOS: 4 days   Andrez Keel MD [facility] Resident PGY-1  Center for [facility], [facility] 02/23/24 7:52 AM

## 2024-02-23 NOTE — Progress Notes (Signed)
 Provider notified of mildly elevated BP. Provider now aware and stated she would look into it.

## 2024-02-23 NOTE — Lactation Note (Signed)
 This note was copied from a baby's chart. Lactation Consultation Note  Patient Name: [name] [id number] Date: 02/23/2024 Age:44 hours, P1  Reason for consult: Follow-up assessment;Primapara;1st time breastfeeding;Early term 37-38.6wks;Infant < 6lbs;Infant weight loss LC reviewed and updated the doc flow sheets. Baby fussy and LC offered to change the diaper, wet and transitional stool diaper.  After diaper change reviewed pace feeding with parents. LC stressed the importance of tapping the upper lip of the baby and allow her to open her mouth to ease so the roof of the mouth would be stimulated so the baby would suck. LC pointed out when the baby latched to the bottle and the LC flipped the upper lip on the bottle there was a better seal.  Dad took over, LC stepped awake, and then the baby was spitty. The NP walked in, helped to clean her up and was working with dad with the bottle.  Per mom has only got a drop pumping.  LC was reviewed breast feeding D/C teaching, supply and demand, importance of consistent pumping around the clock whether the baby latched or not to establish and protect the milk supply.  Mom has LC O/P appt on 6/12 at the [facility] which she is aware.    Maternal Data Has patient been taught Hand Expression?: Yes Does the patient have breastfeeding experience prior to this delivery?: No  Feeding Mother's Current Feeding Choice: Breast Milk and Formula Nipple Type: Nfant Standard Flow (white)  LATCH Score - LC unable to assess latch, baby has had 1/2 th feeding at 11:30 and 2nd 1/2 at 1220.    Lactation Tools Discussed/Used  DEBP   Interventions Interventions: Breast feeding basics reviewed;Education;Pace feeding;CDC milk storage guidelines;CDC Guidelines for Breast Pump Cleaning;LC Services brochure  Discharge Discharge Education: Engorgement and breast care;Warning signs for feeding baby;Outpatient recommendation;Other (comment) ([facility] mom [facility] - appt for 6/12) Pump: DEBP;Manual;Personal  Consult Status Consult Status: Complete Date: 02/23/24    Richarda Chance 02/23/2024, 12:53 PM

## 2024-02-23 NOTE — Discharge Instructions (Signed)

## 2024-02-27 ENCOUNTER — Ambulatory Visit

## 2024-02-27 VITALS — BP 124/74

## 2024-02-27 DIAGNOSIS — Z98891 History of uterine scar from previous surgery: Secondary | ICD-10-CM

## 2024-02-27 DIAGNOSIS — I10 Essential (primary) hypertension: Secondary | ICD-10-CM

## 2024-02-27 DIAGNOSIS — Z0130 Encounter for examination of blood pressure without abnormal findings: Secondary | ICD-10-CM

## 2024-02-27 NOTE — Progress Notes (Signed)
 Subjective:  [name] is a 43 y.o. female here for BP check and incision check.  Hypertension ROS: taking medications as instructed, no medication side effects noted, no TIA's, no chest pain on exertion, no dyspnea on exertion, and no swelling of ankles.   Pt reports incision covered  Objective:  BP 124/74   LMP 05/27/2023 (Exact Date)   Appearance alert, well appearing, and in no distress. Incision slow healing, several places of granular tissue    Assessment:   Blood Pressure today in office well controlled, stable, and improved.----124/74  Incision Incision slow healing, per Dr.Pickens pt to return in 7-10 days for several places of granular tissue.   Plan:  Follow up: in 7-10 days for BP and incision check.

## 2024-02-28 ENCOUNTER — Telehealth: Payer: Self-pay

## 2024-02-28 ENCOUNTER — Other Ambulatory Visit: Payer: Self-pay | Admitting: Cardiology

## 2024-02-28 MED ORDER — ENALAPRIL MALEATE 5 MG PO TABS: 5.0000 mg | ORAL_TABLET | Freq: Every day | ORAL | 3 refills | Status: DC

## 2024-02-28 NOTE — Progress Notes (Signed)
 I received a message from ou IMPACT mom study community health worker reporting that the patient's blood pressure has been elevated since discharge. Today 150/91 mmhg.  I spoke with the patient - she is currently on Amlodipine  10 mg daily, metoprolol  succinate 25 mg daily. I will add Enalipril 5 mg daily - send to her pharmacy. She has a follow up appt June 13.

## 2024-02-28 NOTE — Telephone Encounter (Signed)
   IMPACT Mom Telephone Follow-Up  02/28/2024   [name] is a 43 y.o. G2P1011 who is currently on PPD# (7) enrolled in the IMPACT study.  [name] was discharged on 02/23/2024 with the following prescriptions: acetaminophen  500 MG, amLODipine  10 MG, aspirin  EC 81 MG, Lantus  SoloStar 100 UNIT/ML Solostar Pen, cholecalciferol 25 MCG,Dexcom G7 Sensor, dibucaine 1 % Oint, ferrous sulfate  325 (65 FE), furosemide  20 MG, metoprolol  succinate 25 MG, polycarbophil 625 MG, PROBIOTIC DAILY, vitamin B-12 100 MCG, witch hazel-glycerin  pad  Did pick up their meds and have not begun/begun taking daily. They were able to obtain a blood pressure cuff. Denies  headache, visual disturbances, epigastric pain, shortness of breath, chest pain Endorses excessive swelling states due to section.  Today's BP reading is: 156/91 BP check appointment is scheduled for: 03/08/2024 Message sent to Dr. Sheila Dent for closer follow up? Y/N Sent to [facility]U for immediate evaluation? No  Edinburgh score on hospital discharge was: 2 Referred for IBH follow up at discharge? No Message sent to [facility] for [facility] follow up? No  Invited to postpartum support group scheduled on 03/07/2024 at 10:00 am.  Feeding plan: Formula wanting to breastfeed How long was breastfeeding attempted? Still attempting If breastfeeding at discharge, but now formula feeding, what was the reason for switching to formula? Other Need LC follow up? Yes Appointment scheduled 03/01/2024  Call completed by Monica Ann, BS, IBCLC

## 2024-02-29 ENCOUNTER — Encounter: Admitting: Certified Nurse Midwife

## 2024-03-01 ENCOUNTER — Encounter: Admitting: Obstetrics and Gynecology

## 2024-03-01 ENCOUNTER — Telehealth: Payer: Self-pay

## 2024-03-01 NOTE — Telephone Encounter (Signed)
  [name] is in the office today for Lactation. She is noted to have edema to her feet and legs with Grade 2-3 pitting edema, she does not feel they are deceasing. Her feet are hurting.   I have encouraged her to elevate feet with sitting, drink plenty of fluids and walking some. She reports she is not drinking very well.   She denies blurred vision or dizziness. She reports intermittent headaches.   Her last BP 143/76 last night, repeat 130/80 and repeat again 117/70.   She sees Dr. Emmette Harms tomorrow. Secure Chat also sent to Dr. Emmette Harms.

## 2024-03-02 ENCOUNTER — Ambulatory Visit (INDEPENDENT_AMBULATORY_CARE_PROVIDER_SITE_OTHER): Admitting: Cardiology

## 2024-03-02 ENCOUNTER — Encounter: Payer: Self-pay | Admitting: Cardiology

## 2024-03-02 VITALS — BP 130/82 | HR 60 | Ht 64.0 in | Wt 260.0 lb

## 2024-03-02 DIAGNOSIS — I10 Essential (primary) hypertension: Secondary | ICD-10-CM | POA: Diagnosis not present

## 2024-03-02 DIAGNOSIS — I255 Ischemic cardiomyopathy: Secondary | ICD-10-CM | POA: Diagnosis not present

## 2024-03-02 DIAGNOSIS — E782 Mixed hyperlipidemia: Secondary | ICD-10-CM

## 2024-03-02 DIAGNOSIS — R600 Localized edema: Secondary | ICD-10-CM | POA: Diagnosis not present

## 2024-03-02 DIAGNOSIS — I2510 Atherosclerotic heart disease of native coronary artery without angina pectoris: Secondary | ICD-10-CM

## 2024-03-02 MED ORDER — FUROSEMIDE 40 MG PO TABS: 40.0000 mg | ORAL_TABLET | Freq: Every day | ORAL | 0 refills | Status: DC

## 2024-03-02 MED ORDER — POTASSIUM CHLORIDE CRYS ER 20 MEQ PO TBCR: 20.0000 meq | EXTENDED_RELEASE_TABLET | Freq: Every day | ORAL | 0 refills | Status: DC

## 2024-03-02 MED ORDER — POTASSIUM CHLORIDE 20 MEQ PO PACK: 20.0000 meq | PACK | Freq: Every day | ORAL | 0 refills | Status: DC

## 2024-03-02 NOTE — Patient Instructions (Addendum)
 Medication Instructions:  Your physician has recommended you make the following change in your medication:  For 7 days: Lasix  40 mg once daily.  For 7 days: Potassium 20 mEq once daily.  *If you need a refill on your cardiac medications before your next appointment, please call your pharmacy*  Testing/Procedures: Your physician has requested that you have an echocardiogram. Echocardiography is a painless test that uses sound waves to create images of your heart. It provides your doctor with information about the size and shape of your heart and how well your heart's chambers and valves are working. This procedure takes approximately one hour. There are no restrictions for this procedure. Please do NOT wear cologne, perfume, aftershave, or lotions (deodorant is allowed). Please arrive 15 minutes prior to your appointment time.  Please note: We ask at that you not bring children with you during ultrasound (echo/ vascular) testing. Due to room size and safety concerns, children are not allowed in the ultrasound rooms during exams. Our front office staff cannot provide observation of children in our lobby area while testing is being conducted. An adult accompanying a patient to their appointment will only be allowed in the ultrasound room at the discretion of the ultrasound technician under special circumstances. We apologize for any inconvenience.   Follow-Up: At [facility], you and your health needs are our priority.  As part of our continuing mission to provide you with exceptional heart care, our providers are all part of one team.  This team includes your primary Cardiologist (physician) and Advanced Practice Providers or APPs (Physician Assistants and Nurse Practitioners) who all work together to provide you with the care you need, when you need it.  Your next appointment:   12 week(s)  Provider:   Kardie Tobb, DO

## 2024-03-03 NOTE — Progress Notes (Signed)
 Cardio-Obstetrics Clinic  Follow Up Note   Date:  03/03/2024   ID:  [name], DOB 02/24/1981, MRN [id number]  PCP:  Jadeyn Hargett, DO   [facility] Providers Cardiologist:  Johniya Durfee, DO  Electrophysiologist:  None        Referring MD: Armany Mano, DO   Chief Complaint:  I am ok  History of Present Illness:    [name] is a 43 y.o. female [G2P1011] who returns for follow up at [redacted] weeks pregnant.  Medical history includes diabetes mellitus type 2, obesity she is status post lap band in 2009, coronary artery disease status post inferior wall MI at the age of 52, status post CABG (LIMA to LAD, left radial to diagonal, SVG to sequential to PDA and PLb this was done in November 2018, hypertension, hyperlipidemia.   She is almost two weeks postpartum. Since her discharge she has interacted with our community health worker for the impact mom study. At that time her blood pressure was elevated. I added enalapril  to her blood pressure regimen.   She is experiencing significant leg swelling. She is enjoying motherhood. She is has been getting some support for our lactation specialist.    Prior CV Studies Reviewed: The following studies were reviewed today: Reviewed echo  Past Medical History:  Diagnosis Date   Anxiety    Back pain    Chest pain    Coronary artery disease    Coronary artery disease involving coronary bypass graft of native heart with angina pectoris (HCC) 08/02/2017   Diabetes mellitus    Difficulty swallowing pills    Fatigue    GERD (gastroesophageal reflux disease)    Gout    Hay fever    Headache    Heartburn    History of heart attack    History of varicella    HTN (hypertension)    Hyperlipidemia    Joint pain    Knee pain    Nervousness    Palpitations    Pure hypercholesterolemia with target low density lipoprotein (LDL) cholesterol less than 70 mg/dL 40/98/1191   Shortness of breath on exertion    Sickle cell  trait (HCC) 09/03/2023   Vitamin D  deficiency 11/20/2018    Past Surgical History:  Procedure Laterality Date   CESAREAN SECTION N/A 02/20/2024   Procedure: CESAREAN DELIVERY;  Surgeon: Julianne Octave, MD;  Location: [facility] LD ORS;  Service: Obstetrics;  Laterality: N/A;   CORONARY ARTERY BYPASS GRAFT N/A 08/02/2017   Procedure: CORONARY ARTERY BYPASS GRAFTING (CABG) X 5 (LIMA to LAD, LEFT RADIAL ARTERY to DIAGONAL, SVG to SEQUENTIALLY PDA and PLB) , USING LEFT INTERNAL MAMMARY ARTERY, LEFT RADIAL ARTERY, AND  SAPHENOUS VEIN to CIRCUMFLEX, RIGHT GREATER SAPHENOUS VEIN HARVESTED ENDOSCOPICALLY;  Surgeon: Norita Beauvais, MD;  Location: [facility] OR;  Service: Open Heart Surgery;  Laterality: N/A;   CORONARY/GRAFT ACUTE MI REVASCULARIZATION N/A 07/24/2017   Procedure: Coronary/Graft Acute MI Revascularization;  Surgeon: Arty Binning, MD;  Location: [facility] INVASIVE CV LAB;  Service: Cardiovascular;  Laterality: N/A;   LAPAROSCOPIC GASTRIC BANDING  09/02/2008   LEFT HEART CATH AND CORONARY ANGIOGRAPHY N/A 07/24/2017   Procedure: LEFT HEART CATH AND CORONARY ANGIOGRAPHY;  Surgeon: Arty Binning, MD;  Location: [facility] INVASIVE CV LAB;  Service: Cardiovascular;  Laterality: N/A;   RADIAL ARTERY HARVEST Left 08/02/2017   Procedure: LEFT RADIAL ARTERY HARVEST;  Surgeon: Norita Beauvais, MD;  Location[facility];  Service: Open Heart Surgery;  Laterality:  Left;   STERNAL CLOSURE N/A 08/02/2017   Procedure: STERNAL PLATING;  Surgeon: Norita Beauvais, MD;  Location: [facility] OR;  Service: Open Heart Surgery;  Laterality: N/A;   STOMACH SURGERY  1982   had metal removed from stomach as a child   TEE WITHOUT CARDIOVERSION N/A 08/02/2017   Procedure: TRANSESOPHAGEAL ECHOCARDIOGRAM (TEE);  Surgeon: Norita Beauvais, MD;  Location: [facility] OR;  Service: Open Heart Surgery;  Laterality: N/A;      OB History     Gravida  2   Para  1   Term  1   Preterm  0   AB  1   Living  1      SAB  1   IAB  0   Ectopic  0    Multiple  0   Live Births  1               Current Medications: Current Meds  Medication Sig   acetaminophen  (TYLENOL ) 500 MG tablet Take 500 mg by mouth every 6 (six) hours as needed for mild pain (pain score 1-3) or headache.   amLODipine  (NORVASC ) 10 MG tablet Take 1 tablet (10 mg total) by mouth daily.   aspirin  EC 81 MG tablet Take 2 tablets (162 mg total) by mouth daily.   BD PEN NEEDLE NANO 2ND GEN 32G X 4 MM MISC See admin instructions.   cholecalciferol (VITAMIN D3) 25 MCG (1000 UNIT) tablet Take 1,000 Units by mouth daily.   Continuous Glucose Sensor (DEXCOM G7 SENSOR) MISC daily at 6 (six) AM.   enalapril  (VASOTEC ) 5 MG tablet Take 1 tablet (5 mg total) by mouth daily.   ferrous sulfate  325 (65 FE) MG tablet Take 1 tablet (325 mg total) by mouth every other day.   furosemide  (LASIX ) 40 MG tablet Take 1 tablet (40 mg total) by mouth daily for 7 days.   LANTUS  SOLOSTAR 100 UNIT/ML Solostar Pen Inject 10 Units into the skin daily.   metoprolol  succinate (TOPROL -XL) 25 MG 24 hr tablet Take 1 tablet (25 mg total) by mouth daily.   polycarbophil (FIBERCON) 625 MG tablet Take 625 mg by mouth daily.   potassium chloride  (KLOR-CON ) 20 MEQ packet Take 20 mEq by mouth daily for 7 days.   Probiotic Product (PROBIOTIC DAILY PO) Take by mouth.   vitamin B-12 (CYANOCOBALAMIN ) 100 MCG tablet Take 100 mcg by mouth daily.   witch hazel-glycerin  (TUCKS) pad Apply 1 Application topically as needed for hemorrhoids.   [DISCONTINUED] potassium chloride  SA (KLOR-CON  M20) 20 MEQ tablet Take 1 tablet (20 mEq total) by mouth daily for 7 days.     Allergies:   Patient has no known allergies.   Social History   Socioeconomic History   Marital status: Married    Spouse name: [name]   Number of children: Not on file   Years of education: Not on file   Highest education level: Not on file  Occupational History   Occupation: Solicitor    Employer: [employer]   Tobacco Use   Smoking status: Never   Smokeless tobacco: Never  Vaping Use   Vaping status: Never Used  Substance and Sexual Activity   Alcohol use: Not Currently    Comment: occ   Drug use: No   Sexual activity: Not Currently  Other Topics Concern   Not on file  Social History Narrative   Not on file   Social Drivers of Corporate investment banker  Strain: Not on file  Food Insecurity: No Food Insecurity (02/19/2024)   Hunger Vital Sign    Worried About Running Out of Food in the Last Year: Never true    Ran Out of Food in the Last Year: Never true  Transportation Needs: No Transportation Needs (02/19/2024)   PRAPARE - Administrator, Civil Service (Medical): No    Lack of Transportation (Non-Medical): No  Physical Activity: Not on file  Stress: Not on file  Social Connections: Socially Isolated (02/19/2024)   Social Connection and Isolation Panel    Frequency of Communication with Friends and Family: Never    Frequency of Social Gatherings with Friends and Family: Never    Attends Religious Services: Never    Database administrator or Organizations: No    Attends Engineer, structural: Never    Marital Status: Married      Family History  Problem Relation Age of Onset   Hyperlipidemia Mother    Hypertension Mother    Obesity Mother    Diabetes Father    Hypertension Father    Heart disease Father    Sudden death Father 6       died of MI   Cancer Maternal Grandmother        lung   Cancer Maternal Grandfather        prostate      ROS:   Please see the history of present illness.     All other systems reviewed and are negative.   Labs/EKG Reviewed:    EKG:   EKG was not ordered today.    Recent Labs: 12/22/2023: TSH 1.320; TSH CANCELED 02/21/2024: ALT 12; BUN <5; Creatinine, Ser 0.64; Hemoglobin 11.7; Platelets 258; Potassium 3.4; Sodium 136   Recent Lipid Panel Lab Results  Component Value Date/Time   CHOL 141 05/20/2023 10:03 AM    TRIG 62 05/20/2023 10:03 AM   HDL 49 05/20/2023 10:03 AM   CHOLHDL 2.9 05/20/2023 10:03 AM   CHOLHDL 3.6 07/26/2017 02:50 AM   LDLCALC 79 05/20/2023 10:03 AM    Physical Exam:    VS:  BP 130/82   Pulse 60   Ht 5' 4 (1.626 m)   Wt 260 lb (117.9 kg)   LMP 05/27/2023 (Exact Date)   SpO2 100%   BMI 44.63 kg/m     Wt Readings from Last 3 Encounters:  03/02/24 260 lb (117.9 kg)  02/19/24 271 lb 12.8 oz (123.3 kg)  02/15/24 279 lb 3.2 oz (126.6 kg)     GEN:  Well nourished, well developed in no acute distress HEENT: Normal NECK: No JVD; No carotid bruits LYMPHATICS: No lymphadenopathy CARDIAC: RRR, no murmurs, rubs, gallops RESPIRATORY:  Clear to auscultation without rales, wheezing or rhonchi  ABDOMEN: Soft, non-tender, non-distended MUSCULOSKELETAL:  No edema; No deformity  SKIN: Warm and dry NEUROLOGIC:  Alert and oriented x 3 PSYCHIATRIC:  Normal affect    Risk Assessment/Risk Calculators:                 ASSESSMENT & PLAN:    CAD-thankfully No chest pain suspected for anginal. Will continue to monitor her closely. No chest pain but currently have significant bilateral leg edema. She needs some diuretics, I will add Lasix  40 mg once daily for 5 days with potassium KCL 20 meq 5 days, She is breastfeeding so cautious with lasix .  I will repeat her echo to recheck her EF know ischemic cardiomyopathy.  She has been off  atorvastatin , off statin during pregnancy. She is breastfeeding so will hold off on restarting statin.   Will closely monitor if needed will increase metoprolol  if suspect angina. Continue Aspirin   for preeclampsia prophylaxis  Chronic hypertension - added Enalapril  5 mg daily recently. Continue her current dose of Amlodipine  10 mg daily  She needs a close follow up , she will see our cardioobstetrics pharmacist. If her leg remains swollen we will cut back on the amlodipine  and increase enalapril .    The patient is in agreement with the above  plan. The patient left the office in stable condition.  The patient will follow up in  Patient Instructions  Medication Instructions:  Your physician has recommended you make the following change in your medication:  For 7 days: Lasix  40 mg once daily.  For 7 days: Potassium 20 mEq once daily.  *If you need a refill on your cardiac medications before your next appointment, please call your pharmacy*  Testing/Procedures: Your physician has requested that you have an echocardiogram. Echocardiography is a painless test that uses sound waves to create images of your heart. It provides your doctor with information about the size and shape of your heart and how well your heart's chambers and valves are working. This procedure takes approximately one hour. There are no restrictions for this procedure. Please do NOT wear cologne, perfume, aftershave, or lotions (deodorant is allowed). Please arrive 15 minutes prior to your appointment time.  Please note: We ask at that you not bring children with you during ultrasound (echo/ vascular) testing. Due to room size and safety concerns, children are not allowed in the ultrasound rooms during exams. Our front office staff cannot provide observation of children in our lobby area while testing is being conducted. An adult accompanying a patient to their appointment will only be allowed in the ultrasound room at the discretion of the ultrasound technician under special circumstances. We apologize for any inconvenience.   Follow-Up: At [facility], you and your health needs are our priority.  As part of our continuing mission to provide you with exceptional heart care, our providers are all part of one team.  This team includes your primary Cardiologist (physician) and Advanced Practice Providers or APPs (Physician Assistants and Nurse Practitioners) who all work together to provide you with the care you need, when you need it.  Your next appointment:    12 week(s)  Provider:   Zacariah Belue, DO        Dispo:  No follow-ups on file.   Medication Adjustments/Labs and Tests Ordered: Current medicines are reviewed at length with the patient today.  Concerns regarding medicines are outlined above.  Tests Ordered: Orders Placed This Encounter  Procedures   ECHOCARDIOGRAM COMPLETE   Medication Changes: Meds ordered this encounter  Medications   furosemide  (LASIX ) 40 MG tablet    Sig: Take 1 tablet (40 mg total) by mouth daily for 7 days.    Dispense:  7 tablet    Refill:  0   DISCONTD: potassium chloride  SA (KLOR-CON  M20) 20 MEQ tablet    Sig: Take 1 tablet (20 mEq total) by mouth daily for 7 days.    Dispense:  7 tablet    Refill:  0   potassium chloride  (KLOR-CON ) 20 MEQ packet    Sig: Take 20 mEq by mouth daily for 7 days.    Dispense:  7 packet    Refill:  0

## 2024-03-05 ENCOUNTER — Telehealth: Payer: Self-pay

## 2024-03-05 NOTE — Telephone Encounter (Signed)
   IMPACT Mom Telephone Follow-Up  03/05/2024   [name] is a 43 y.o. G2P1011 who is 1 week postpartum and enrolled in the IMPACT study.  Kayley D Jeziorski was discharged on 02/23/2024 with the following prescriptions: acetaminophen  500 MG, amLODipine  10 MG, aspirin  EC 81 MG, Lantus  SoloStar 100 UNIT/ML Solostar Pen, cholecalciferol 25 MCG,Dexcom G7 Sensor, dibucaine 1 % Oint, ferrous sulfate  325 (65 FE), furosemide  20 MG, metoprolol  succinate 25 MG, polycarbophil 625 MG, PROBIOTIC DAILY, vitamin B-12 100 MCG, witch hazel-glycerin  pad  IS still taking discharge prescriptions. Was placed on Lasix  and potassium and is currently taking for 7 days. Attended postpartum follow up on: N/A OR has postpartum follow up scheduled for: April 03, 2024 Still has a diagnosis of postpartum hypertension? Yes Has OB cardiology follow up scheduled? Yes 03/16/2024  Today's BP reading is: 132/72 Next cardiology appointment is scheduled for: 05/29/2024 Message sent to Dr. Sheila Dent for closer follow up? No Sent to [facility]U for immediate evaluation? No  Has attended postpartum support group? N/A Invited to postpartum support group on 03/07/2024  Edinburgh score on hospital discharge was: 2 Edinburgh score at postpartum follow up was: N/A Has referral for IBH follow up? No  Message sent to [facility] for [facility] follow up? No   Feeding plan: Breastfeeding and Formula  How long was breastfeeding attempted? Still attempting If breastfeeding at discharge, but now formula feeding, what was the reason for switching to formula? Other Need LC follow up? Yes has follow up scheduled   Call completed by Monica Ann, BS, IBCLC

## 2024-03-07 ENCOUNTER — Encounter: Admitting: Obstetrics and Gynecology

## 2024-03-08 ENCOUNTER — Ambulatory Visit

## 2024-03-08 VITALS — BP 136/81 | HR 73

## 2024-03-08 DIAGNOSIS — Z98891 History of uterine scar from previous surgery: Secondary | ICD-10-CM

## 2024-03-08 DIAGNOSIS — I10 Essential (primary) hypertension: Secondary | ICD-10-CM

## 2024-03-08 DIAGNOSIS — Z0130 Encounter for examination of blood pressure without abnormal findings: Secondary | ICD-10-CM

## 2024-03-08 NOTE — Progress Notes (Signed)
 Subjective:  [name] is a 43 y.o. female here for BP check and incision check.  Hypertension ROS: taking medications as instructed, no medication side effects noted, no TIA's, no chest pain on exertion, no dyspnea on exertion, and no swelling of ankles.   Pt reports incision healing well  Objective:  LMP 05/27/2023 (Exact Date)   Appearance alert, well appearing, and in no distress. Incision healing well   Assessment:   Blood Pressure today in office stable.  Incision healed nicely, no signs of infection, scar looks good  Plan:  Follow up at [facility] .

## 2024-03-09 ENCOUNTER — Inpatient Hospital Stay (HOSPITAL_COMMUNITY): Admission: RE | Admit: 2024-03-09 | Source: Home / Self Care | Admitting: Obstetrics & Gynecology

## 2024-03-12 ENCOUNTER — Telehealth: Payer: Self-pay

## 2024-03-12 NOTE — Telephone Encounter (Signed)
   IMPACT Mom Telephone Follow-Up  03/12/2024   [name] is a 43 y.o. G2P1011 who is 2 weeks postpartum and enrolled in the IMPACT study.  [name] was discharged on 02/23/2024 with the following prescriptions: IS still taking discharge prescriptions. Attended postpartum follow up on:  OR has postpartum follow up scheduled for: 04/03/2024 Still has a diagnosis of postpartum hypertension? Yes  Has OB cardiology follow up scheduled?Yes 03/16/2024  Today's BP reading is: 127/70 Next cardiology appointment is scheduled for:05/29/2024 Message sent to Dr. Aquilla Finder for closer follow up? No Sent to MAU for immediate evaluation? No  Has attended postpartum support group? No  Invited to postpartum support group on 04/05/2024  Edinburgh score on hospital discharge was: 2 Edinburgh score at postpartum follow up was: N/A Has referral for IBH follow up? No  Message sent to [facility] for [facility] follow up? No   Feeding plan: Breastfeeding and Formula  How long was breastfeeding attempted? Still attempting  If breastfeeding at discharge, but now formula feeding, what was the reason for switching to formula? Other Need LC follow up? Yes has followed up scheduled   Call completed by Geraldina Louder, BS, IBCLC

## 2024-03-16 ENCOUNTER — Other Ambulatory Visit: Payer: Self-pay | Admitting: Cardiology

## 2024-03-16 ENCOUNTER — Encounter: Payer: Self-pay | Admitting: Pharmacist

## 2024-03-16 ENCOUNTER — Ambulatory Visit: Attending: Cardiology | Admitting: Pharmacist

## 2024-03-16 ENCOUNTER — Telehealth: Payer: Self-pay | Admitting: Pharmacist

## 2024-03-16 VITALS — BP 110/74 | HR 79

## 2024-03-16 DIAGNOSIS — I10 Essential (primary) hypertension: Secondary | ICD-10-CM | POA: Diagnosis not present

## 2024-03-16 DIAGNOSIS — I25709 Atherosclerosis of coronary artery bypass graft(s), unspecified, with unspecified angina pectoris: Secondary | ICD-10-CM | POA: Diagnosis not present

## 2024-03-16 MED ORDER — POTASSIUM CHLORIDE 20 MEQ PO PACK: 20.0000 meq | PACK | Freq: Every day | ORAL | 0 refills | Status: DC

## 2024-03-16 MED ORDER — FUROSEMIDE 20 MG PO TABS: 20.0000 mg | ORAL_TABLET | Freq: Every day | ORAL | Status: AC

## 2024-03-16 MED ORDER — FUROSEMIDE 20 MG PO TABS: 20.0000 mg | ORAL_TABLET | Freq: Every day | ORAL | 3 refills | Status: DC

## 2024-03-16 NOTE — Telephone Encounter (Signed)
 Pt c/o medication issue:  1. Name of Medication:   furosemide  (LASIX ) 20 MG tablet    2. How are you currently taking this medication (dosage and times per day)?  Take 1 tablet (20 mg total) by mouth daily.      3. Are you having a reaction (difficulty breathing--STAT)? No  4. What is your medication issue? Pt is requesting a callback regarding her being informed by the pharmacy that this medication can't be filled until they've been contacted by the office to clarify if this medication is for 5 or 7 days. Please advise

## 2024-03-16 NOTE — Telephone Encounter (Signed)
Contacted pharmacy and clarified prescription.  

## 2024-03-16 NOTE — Patient Instructions (Addendum)
 Congrats!  Your blood pressure looks great today  Please continue your: Enalapril  5mg  daily Metoprolol  25mg  daily  You can reduce your amlodipine  to 5mg  once a day to see if that helps with the swelling. I will also get you some furosemide  and potassium  Please let us  know if you have any issues  Medford Bolk, PharmD, BCACP, CDCES, CPP [facility] [address] [place], [place] [address] Phone: [phone]; Fax: [phone] 03/16/2024 9:55 AM

## 2024-03-16 NOTE — Progress Notes (Signed)
 Patient ID: [name]                 DOB: 1981/02/08                      MRN: [id number]     HPI: [name] is a 43 y.o. female referred to Cardio OB clinic. Gave birth on 02/20/24.  PMH is significant for STEMI, CAD, T2DM, and HTN.  Patient presents today with newborn. Feet and legs remain swollen and uncomfortable despite compression stockings. Had to purchase new shoes.   Trying to breastfeed but is having trouble. Is meeting with lactation counselor.   Currently enrolled in Impact Mom study.  Has been checking BP at home and reports well controlled. Reports readings of 115-120/70s. Currently on enalapril  5mg  daily, amlodipine  10mg  daily, and metoprolol  25mg  daily. She is concerned the amlodipine  may be contributing to LEE.  Has not checked blood sugar. Taken off Novolog  after delivery and remains on Lantus .  No pain in incision site and reports OB says is healing well.  Echocardiogram scheduled for 04/18/24  Current HTN meds:  Metoprolol  25mg  daily Enalapril  5mg  daily Amlodipine  10mg  daily  BP goal: <130/80   Wt Readings from Last 3 Encounters:  03/02/24 260 lb (117.9 kg)  02/19/24 271 lb 12.8 oz (123.3 kg)  02/15/24 279 lb 3.2 oz (126.6 kg)   BP Readings from Last 3 Encounters:  03/08/24 136/81  03/02/24 130/82  02/27/24 124/74   Pulse Readings from Last 3 Encounters:  03/08/24 73  03/02/24 60  02/23/24 68    Renal function: CrCl cannot be calculated (Patient's most recent lab result is older than the maximum 21 days allowed.).  Past Medical History:  Diagnosis Date   Anxiety    Back pain    Chest pain    Coronary artery disease    Coronary artery disease involving coronary bypass graft of native heart with angina pectoris (HCC) 08/02/2017   Diabetes mellitus    Difficulty swallowing pills    Fatigue    GERD (gastroesophageal reflux disease)    Gout    Hay fever    Headache    Heartburn    History of heart attack    History of  varicella    HTN (hypertension)    Hyperlipidemia    Joint pain    Knee pain    Nervousness    Palpitations    Pure hypercholesterolemia with target low density lipoprotein (LDL) cholesterol less than 70 mg/dL 94/98/7980   Shortness of breath on exertion    Sickle cell trait (HCC) 09/03/2023   Vitamin D  deficiency 11/20/2018    Current Outpatient Medications on File Prior to Visit  Medication Sig Dispense Refill   Accu-Chek FastClix Lancets MISC as directed subcutaneous Twice a day; Duration: 30 days     acetaminophen  (TYLENOL ) 500 MG tablet Take 500 mg by mouth every 6 (six) hours as needed for mild pain (pain score 1-3) or headache.     amLODipine  (NORVASC ) 10 MG tablet Take 1 tablet (10 mg total) by mouth daily. 60 tablet 2   aspirin  EC 81 MG tablet Take 2 tablets (162 mg total) by mouth daily. 300 tablet 2   BD PEN NEEDLE NANO 2ND GEN 32G X 4 MM MISC See admin instructions.     cholecalciferol (VITAMIN D3) 25 MCG (1000 UNIT) tablet Take 1,000 Units by mouth daily.     Continuous Glucose Sensor (DEXCOM G7 SENSOR) MISC  daily at 6 (six) AM.     dibucaine (NUPERCAINAL) 1 % OINT Place 1 Application rectally as needed for hemorrhoids. (Patient not taking: Reported on 03/02/2024) 10 g 0   enalapril  (VASOTEC ) 5 MG tablet Take 1 tablet (5 mg total) by mouth daily. 30 tablet 3   ferrous sulfate  325 (65 FE) MG tablet Take 1 tablet (325 mg total) by mouth every other day. 15 tablet 0   furosemide  (LASIX ) 40 MG tablet Take 1 tablet (40 mg total) by mouth daily for 7 days. 7 tablet 0   insulin  aspart (NOVOLOG  FLEXPEN) 100 UNIT/ML FlexPen Inject 8-12 Units into the skin 3 (three) times daily with meals. Take 8-12 units before each meal depending on the sugar range (Patient not taking: Reported on 03/02/2024)     LANTUS  SOLOSTAR 100 UNIT/ML Solostar Pen Inject 10 Units into the skin daily.     metoprolol  succinate (TOPROL -XL) 25 MG 24 hr tablet Take 1 tablet (25 mg total) by mouth daily. 30 tablet 3    polycarbophil (FIBERCON) 625 MG tablet Take 625 mg by mouth daily.     potassium chloride  (KLOR-CON ) 20 MEQ packet Take 20 mEq by mouth daily for 7 days. 7 packet 0   Probiotic Product (PROBIOTIC DAILY PO) Take by mouth.     vitamin B-12 (CYANOCOBALAMIN ) 100 MCG tablet Take 100 mcg by mouth daily.     witch hazel-glycerin  (TUCKS) pad Apply 1 Application topically as needed for hemorrhoids. 40 each 12   No current facility-administered medications on file prior to visit.    No Known Allergies   Assessment/Plan:  1. Hypertension -  Patient BP in room 109/79 and 110/74 and patient feels well. Concerned her BP is too low, no hypotensive symptoms. Will reduce amlodipine  to 5mg  daily to see if that helps with swelling and give 5 days of Lasix  plus potassium packets. Patient will report back if BP begins trending upwards.   Continue enalapril  5mg  daily Continue metoprolol  25mg  daily Reduce amlodipine  to 5mg  daily Start Lasix  20mg  +K x 5 days  Chris Versia Mignogna, PharmD, BCACP, CDCES, CPP [facility] [address], [place], [place] [address] Phone: [phone]; Fax: [phone] 03/16/2024 11:11 AM

## 2024-03-19 ENCOUNTER — Telehealth: Payer: Self-pay

## 2024-03-19 NOTE — Telephone Encounter (Signed)
 Called to complete IMPACT Mom weekly check in. Lactating parent stated that she was feeding her baby and to give her a call back in an hour.

## 2024-03-19 NOTE — Telephone Encounter (Signed)
 Called back to complete IMPACT MOM weekly check in. Left VM to return call.

## 2024-03-20 NOTE — Progress Notes (Unsigned)
   IMPACT Mom Telephone Follow-Up  03/20/2024   [name] is a 43 y.o. G2P1011 who is 4 weeks postpartum and enrolled in the IMPACT study.  [name] was discharged on 02/23/2024 with the following prescriptions: acetaminophen  500 MG, amLODipine  10 MG, aspirin  EC 81 MG, Lantus  SoloStar 100 UNIT/ML Solostar Pen, cholecalciferol 25 MCG,Dexcom G7 Sensor, dibucaine 1 % Oint, ferrous sulfate  325 (65 FE), furosemide  20 MG, metoprolol  succinate 25 MG, polycarbophil 625 MG, PROBIOTIC DAILY, vitamin B-12 100 MCG, witch hazel-glycerin  pad  IS still taking discharge prescriptions. Attended postpartum follow up on: N/A OR has postpartum follow up scheduled for: 04/03/2024 Still has a diagnosis of postpartum hypertension? Yes Has OB cardiology follow up scheduled?Yes 03/16/2024  Today's BP reading is: 128/75 Next cardiology appointment is scheduled for: 05/29/2024 Message sent to Dr. Aquilla Finder for closer follow up? No Sent to MAU for immediate evaluation? No  Has attended postpartum support group? No Invited to postpartum support group on 04/05/2024  Edinburgh score on hospital discharge was: 2 Edinburgh score at postpartum follow up was: N/A Has referral for IBH follow up? No Message sent to [facility] for [facility] follow up? No  Feeding plan: Breastfeeding and Formula How long was breastfeeding attempted? Still attempting If breastfeeding at discharge, but now formula feeding, what was the reason for switching to formula? Other Need LC follow up? Yes Has follow scheduled for 04/05/2024   Call completed by Geraldina Louder, BS, IBCLC

## 2024-03-21 ENCOUNTER — Telehealth: Payer: Self-pay | Admitting: Pharmacist

## 2024-03-21 DIAGNOSIS — R600 Localized edema: Secondary | ICD-10-CM

## 2024-03-21 MED ORDER — POTASSIUM CHLORIDE 20 MEQ PO PACK: PACK | ORAL | 0 refills | Status: AC

## 2024-03-21 NOTE — Telephone Encounter (Signed)
 Rx sent incorrectly at last visit. Resending

## 2024-03-21 NOTE — Telephone Encounter (Signed)
-----   Message from Nurse Reena DEL sent at 03/20/2024 10:46 AM EDT ----- Regarding: Lasix  and K RX Patient reports Lasix  and K sent to Pharmacy. Having trouble getting filled due to the K prescription. Can you all look into this?   Reena, RN

## 2024-03-26 ENCOUNTER — Telehealth: Payer: Self-pay

## 2024-03-26 NOTE — Telephone Encounter (Signed)
 Called to complete IMPACT MOM weekly check in. Left VM to return call.

## 2024-03-29 ENCOUNTER — Telehealth: Payer: Self-pay | Admitting: Lactation Services

## 2024-03-29 NOTE — Telephone Encounter (Signed)
 [place] called and left a message for Geraldina, returning the call from Tucker on Monday for Impact Mom weekly check up phone call.

## 2024-04-03 ENCOUNTER — Encounter: Payer: Self-pay | Admitting: Obstetrics & Gynecology

## 2024-04-03 ENCOUNTER — Ambulatory Visit (INDEPENDENT_AMBULATORY_CARE_PROVIDER_SITE_OTHER): Admitting: Obstetrics & Gynecology

## 2024-04-03 ENCOUNTER — Telehealth: Payer: Self-pay

## 2024-04-03 DIAGNOSIS — I519 Heart disease, unspecified: Secondary | ICD-10-CM

## 2024-04-03 DIAGNOSIS — Z1231 Encounter for screening mammogram for malignant neoplasm of breast: Secondary | ICD-10-CM

## 2024-04-03 DIAGNOSIS — E1169 Type 2 diabetes mellitus with other specified complication: Secondary | ICD-10-CM

## 2024-04-03 DIAGNOSIS — I10 Essential (primary) hypertension: Secondary | ICD-10-CM | POA: Diagnosis not present

## 2024-04-03 DIAGNOSIS — I252 Old myocardial infarction: Secondary | ICD-10-CM | POA: Diagnosis not present

## 2024-04-03 DIAGNOSIS — E669 Obesity, unspecified: Secondary | ICD-10-CM

## 2024-04-03 NOTE — Telephone Encounter (Signed)
   IMPACT Mom Telephone Follow-Up  04/03/2024   [name] is a 43 y.o. G2P1011 who is 5 weeks postpartum and enrolled in the IMPACT study.  [name] was discharged on 02/23/2024 with the following prescriptions: acetaminophen  500 MG, amLODipine  5 MG, aspirin  EC 81 MG, Lantus  SoloStar 100 UNIT/ML Solostar Pen, cholecalciferol 25 MCG,Dexcom G7 Sensor, dibucaine 1 % Oint, ferrous sulfate  325 (65 FE), furosemide  20 MG, metoprolol  succinate 25 MG, polycarbophil 625 MG, PROBIOTIC DAILY, vitamin B-12 100 MCG, witch hazel-glycerin  pad   IS still taking discharge prescriptions. Attended postpartum follow up on: OR has postpartum follow up scheduled for:7/015/2025 Still has a diagnosis of postpartum hypertension? Yes Has OB cardiology follow up scheduled? 03/16/2024  Today's BP reading is:  Next cardiology appointment is scheduled for: 05/29/2024 Message sent to Dr. Aquilla Finder for closer follow up? No Sent to MAU for immediate evaluation? No  Has attended postpartum support group? No  Invited to postpartum support group on 04/05/2024  Edinburgh score on hospital discharge was:2 Edinburgh score at postpartum follow up was: N/A Has referral for IBH follow up? No Message sent to [facility] for [facility] follow up? No  Feeding plan:Breastfeeding and Formula  How long was breastfeeding attempted? Still attempting If breastfeeding at discharge, but now formula feeding, what was the reason for switching to formula? Other Need LC follow up? Yes scheduled for 04/05/2024   Call completed by Geraldina Louder, BS, IBCLC

## 2024-04-03 NOTE — Progress Notes (Signed)
 Post Partum Visit Note  [name] is a 43 y.o. G37P1011 female who presents for a postpartum visit. She is 6 weeks postpartum following a  STAT Low Transverse  Cesarean Section done for fetal indications.  I have fully reviewed the prenatal and intrapartum course; patient had CHTN, history of STEMI, Type 2 DM.  The delivery was at 37 gestational weeks.  Anesthesia: epidural. Postpartum course has been uncomplicated. Baby is doing well. Baby is feeding by bottle - bobbi organic w/iron. Bleeding staining only possibly had cycles last week. Bowel function is normal. Bladder function is normal. Patient is not sexually active. Contraception method is none. Postpartum depression screening: negative.  The pregnancy intention screening data noted above was reviewed. Potential methods of contraception were discussed. The patient elected to proceed with FAM.   Edinburgh Postnatal Depression Scale - 04/03/24 1618       Edinburgh Postnatal Depression Scale:  In the Past 7 Days   I have been able to laugh and see the funny side of things. 0    I have looked forward with enjoyment to things. 0    I have blamed myself unnecessarily when things went wrong. 0    I have been anxious or worried for no good reason. 0    I have felt scared or panicky for no good reason. 0    Things have been getting on top of me. 0    I have been so unhappy that I have had difficulty sleeping. 0    I have felt sad or miserable. 0    I have been so unhappy that I have been crying. 0    The thought of harming myself has occurred to me. 0    Edinburgh Postnatal Depression Scale Total 0         Health Maintenance Due  Topic Date Due   FOOT EXAM  Never done   OPHTHALMOLOGY EXAM  Never done   Diabetic kidney evaluation - Urine ACR  Never done   DTaP/Tdap/Td (1 - Tdap) Never done   Pneumococcal Vaccine 102-28 Years old (1 of 2 - PCV) Never done   Hepatitis B Vaccines (1 of 3 - 19+ 3-dose series) Never done   HPV  VACCINES (1 - 3-dose SCDM series) Never done   COVID-19 Vaccine (1 - 2024-25 season) Never done    The following portions of the patient's history were reviewed and updated as appropriate: allergies, current medications, past family history, past medical history, past social history, past surgical history, and problem list.  Review of Systems Pertinent items noted in HPI and remainder of comprehensive ROS otherwise negative.  Objective:  BP (!) 144/86   Pulse 75   Wt 272 lb (123.4 kg)   LMP 05/27/2023 (Exact Date)   Breastfeeding No   BMI 46.69 kg/m    General:  alert and no distress   Breasts:  not indicated  Lungs: clear to auscultation bilaterally  Heart:  regular rate noted  Abdomen: soft, non-tender; bowel sounds normal; no masses,  no organomegaly   Wound well approximated incision, well-healed  GU exam:  not indicated       Assessment:   1. Essential hypertension 2. History of ST elevation myocardial infarction 3. Cardiac disease 4. Type 2 diabetes mellitus with obesity (HCC) 5. Breast cancer screening by mammogram 6. Postpartum care following cesarean delivery (Primary)  Plan:   Essential components of care per ACOG recommendations:  1.  Mood and well being:  Patient with negative depression screening today. Reviewed local resources for support.  - Patient tobacco use? No.   - hx of drug use? No.    2. Infant care and feeding:  -Patient currently breastmilk feeding? No.  -Social determinants of health (SDOH) reviewed in EPIC. No concerns.  3. Sexuality, contraception and birth spacing - Patient does not want a pregnancy in the next year.  Desired family size is 1 child.  - Reviewed reproductive life planning. Reviewed contraceptive methods based on pt preferences and effectiveness.  Patient desired FAM or LAM today.   - Discussed birth spacing of 18 months  4. Sleep and fatigue -Encouraged family/partner/community support of 4 hrs of uninterrupted sleep to  help with mood and fatigue  5. Physical Recovery  - Discussed patients delivery and complications. - Patient had a C-section emergent for NRFHT - Patient is safe to resume physical and sexual activity  6.  Health Maintenance - HM due items addressed Yes - Last pap smear 01/31/2023 was negative.  Pap smear not done at today's visit.  -Breast Cancer screening indicated? Yes. Patient referred today for mammogram.   7. Chronic Disease/Pregnancy Condition follow up: HTN, T2DM, CAD, h/o STEMI - Follow up with Cardiology and Endocrinology specialists   Gloris Hugger, MD [facility]

## 2024-04-05 ENCOUNTER — Other Ambulatory Visit: Payer: Self-pay | Admitting: Lactation Services

## 2024-04-05 MED ORDER — NYSTATIN 100000 UNIT/GM EX CREA: TOPICAL_CREAM | CUTANEOUS | 1 refills | Status: AC

## 2024-04-05 NOTE — Progress Notes (Signed)
 Mom is breast feeding, nipples are sore and more moist than they have been. Infant with no Thrush Symptoms. Infant has had a mild diaper rash, that is clearing. Nystatin  ordered for mom per standing order. Thrush treatment Protocol provided for and reviewed with mom.

## 2024-04-12 ENCOUNTER — Ambulatory Visit

## 2024-04-18 ENCOUNTER — Ambulatory Visit (HOSPITAL_COMMUNITY)
Admission: RE | Admit: 2024-04-18 | Discharge: 2024-04-18 | Disposition: A | Discharge status: Home / Self Care | Source: Ambulatory Visit | Attending: Cardiovascular Disease | Admitting: Cardiovascular Disease

## 2024-04-18 DIAGNOSIS — I361 Nonrheumatic tricuspid (valve) insufficiency: Secondary | ICD-10-CM

## 2024-04-18 DIAGNOSIS — I255 Ischemic cardiomyopathy: Secondary | ICD-10-CM | POA: Diagnosis not present

## 2024-04-18 DIAGNOSIS — I517 Cardiomegaly: Secondary | ICD-10-CM

## 2024-04-18 LAB — ECHOCARDIOGRAM COMPLETE
Area-P 1/2: 3.33 cm2
S' Lateral: 3.3 cm

## 2024-04-23 ENCOUNTER — Telehealth: Payer: Self-pay

## 2024-04-23 NOTE — Telephone Encounter (Signed)
   IMPACT Mom Telephone Follow-Up  04/23/2024   [name] is a 43 y.o. G2P1011 who is 2 months postpartum and enrolled in the IMPACT Mom study.  Alani D Haji was discharged on 02/23/2024 with the following related diagnoses: Term Pregnancy Delivered, CHTN, and Type 2 DM, postpartum hypertension Attended postpartum follow up on: 04/03/2024  Still has a diagnosis of postpartum hypertension? Yes Has OB cardiology follow up scheduled? Yes 05/29/2024   Today's BP reading is: 131/86 Next cardiology appointment is scheduled for: 05/29/2024  Message sent to Dr. Aquilla Finder for closer follow up? No  Has attended postpartum support group? 7/17//2025 Invited to postpartum support group on 05/10/2024  Edinburgh score on hospital discharge was: 2 Edinburgh score at postpartum follow up was: N/A Has referral for IBH follow up? No Message sent to [facility] for [facility] follow up? No  Feeding plan: Breastfeeding and Formula How long was breastfeeding attempted? Still attempting  Need LC follow up? No If breastfeeding at discharge, but now formula feeding, what was the reason for switching to formula? Other Still attempting    Call completed by Geraldina Louder, BS, IBCLC

## 2024-04-25 ENCOUNTER — Ambulatory Visit: Payer: Self-pay | Admitting: Cardiology

## 2024-04-30 ENCOUNTER — Ambulatory Visit
Admission: RE | Admit: 2024-04-30 | Discharge: 2024-04-30 | Disposition: A | Discharge status: Home / Self Care | Source: Ambulatory Visit | Attending: Obstetrics & Gynecology | Admitting: Obstetrics & Gynecology

## 2024-04-30 DIAGNOSIS — Z1231 Encounter for screening mammogram for malignant neoplasm of breast: Secondary | ICD-10-CM

## 2024-05-01 ENCOUNTER — Ambulatory Visit: Payer: Self-pay | Admitting: Obstetrics & Gynecology

## 2024-05-08 ENCOUNTER — Telehealth: Payer: Self-pay | Admitting: *Deleted

## 2024-05-08 DIAGNOSIS — O10919 Unspecified pre-existing hypertension complicating pregnancy, unspecified trimester: Secondary | ICD-10-CM

## 2024-05-08 MED ORDER — METOPROLOL SUCCINATE ER 25 MG PO TB24: 25.0000 mg | ORAL_TABLET | Freq: Every day | ORAL | 3 refills | Status: DC

## 2024-05-08 NOTE — Telephone Encounter (Signed)
 Rx refill sent to pharmacy.

## 2024-05-29 ENCOUNTER — Encounter: Payer: Self-pay | Admitting: Cardiology

## 2024-05-29 ENCOUNTER — Ambulatory Visit: Attending: Cardiovascular Disease | Admitting: Cardiology

## 2024-05-29 VITALS — BP 142/80 | HR 60 | Ht 64.0 in | Wt 279.4 lb

## 2024-05-29 DIAGNOSIS — E782 Mixed hyperlipidemia: Secondary | ICD-10-CM | POA: Diagnosis not present

## 2024-05-29 DIAGNOSIS — I10 Essential (primary) hypertension: Secondary | ICD-10-CM

## 2024-05-29 DIAGNOSIS — I2510 Atherosclerotic heart disease of native coronary artery without angina pectoris: Secondary | ICD-10-CM

## 2024-05-29 DIAGNOSIS — E1169 Type 2 diabetes mellitus with other specified complication: Secondary | ICD-10-CM | POA: Diagnosis not present

## 2024-05-29 DIAGNOSIS — E669 Obesity, unspecified: Secondary | ICD-10-CM

## 2024-05-29 MED ORDER — ATORVASTATIN CALCIUM 20 MG PO TABS: 20.0000 mg | ORAL_TABLET | Freq: Every day | ORAL | 3 refills | Status: AC

## 2024-05-29 MED ORDER — OLMESARTAN MEDOXOMIL 20 MG PO TABS: 20.0000 mg | ORAL_TABLET | Freq: Every day | ORAL | 3 refills | Status: AC

## 2024-05-29 NOTE — Patient Instructions (Signed)
 Medication Instructions:  Your physician has recommended you make the following change in your medication:  STOP: Enalapril  START: Olmesartan  20 mg once daily START: Lipitor  20 mg once daily  *If you need a refill on your cardiac medications before your next appointment, please call your pharmacy*  Follow-Up: At [facility], you and your health needs are our priority.  As part of our continuing mission to provide you with exceptional heart care, our providers are all part of one team.  This team includes your primary Cardiologist (physician) and Advanced Practice Providers or APPs (Physician Assistants and Nurse Practitioners) who all work together to provide you with the care you need, when you need it.  Your next appointment:   6 month(s)  Provider:   Kardie Tobb, DO    Other Instructions Please take your blood pressure daily for 1 week and send in a MyChart message. Please include heart rates. (One message at the end of the 1 week).   HOW TO TAKE YOUR BLOOD PRESSURE: Rest 5 minutes before taking your blood pressure. Don't smoke or drink caffeinated beverages for at least 30 minutes before. Take your blood pressure before (not after) you eat. Sit comfortably with your back supported and both feet on the floor (don't cross your legs). Elevate your arm to heart level on a table or a desk. Use the proper sized cuff. It should fit smoothly and snugly around your bare upper arm. There should be enough room to slip a fingertip under the cuff. The bottom edge of the cuff should be 1 inch above the crease of the elbow. Ideally, take 3 measurements at one sitting and record the average.

## 2024-05-29 NOTE — Progress Notes (Unsigned)
 Cardio[facility]stetrics Clinic  Follow Up Note   Date:  05/29/2024   ID:  [name], DOB 15-Feb-1981, MRN [id number]  PCP:  Franki Stemen, DO   [facility] Providers Cardiologist:  Tonnette Zwiebel, DO  Electrophysiologist:  None        Referring MD: Can Lucci, DO   Chief Complaint:  I am ok  History of Present Illness:    [name] is a 43 y.o. female [G2P1011] who returns for follow up at [redacted] weeks pregnant.  Medical history includes diabetes mellitus type 2, obesity she is status post lap band in 2009, coronary artery disease status post inferior wall MI at the age of 73, status post CABG (LIMA to LAD, left radial to diagonal, SVG to sequential to PDA and PLb this was done in November 2018, hypertension, hyperlipidemia.   She is doing well. She is only bothered for not losing weight. Her baby girl is doing well.  Prior CV Studies Reviewed: The following studies were reviewed today: Reviewed echo  Past Medical History:  Diagnosis Date   Anxiety    Back pain    Chest pain    Coronary artery disease    Coronary artery disease involving coronary bypass graft of native heart with angina pectoris (HCC) 08/02/2017   Diabetes mellitus    Diabetes type 2 (HCC)    Difficulty swallowing pills    Fatigue    GERD (gastroesophageal reflux disease)    Gout    Hay fever    Headache    Heartburn    History of heart attack    History of varicella    HTN (hypertension)    Hyperlipidemia    Joint pain    Knee pain    Nervousness    Palpitations    Pure hypercholesterolemia with target low density lipoprotein (LDL) cholesterol less than 70 mg/dL 94/98/7980   Shortness of breath on exertion    Sickle cell trait (HCC) 09/03/2023   Vitamin D  deficiency 11/20/2018    Past Surgical History:  Procedure Laterality Date   CESAREAN SECTION N/A 02/20/2024   Procedure: CESAREAN DELIVERY;  Surgeon: Herchel Gloris LABOR, MD;  Location: [facility] ORS;  Service: Obstetrics;   Laterality: N/A;   CORONARY ARTERY BYPASS GRAFT N/A 08/02/2017   Procedure: CORONARY ARTERY BYPASS GRAFTING (CABG) X 5 (LIMA to LAD, LEFT RADIAL ARTERY to DIAGONAL, SVG to SEQUENTIALLY PDA and PLB) , USING LEFT INTERNAL MAMMARY ARTERY, LEFT RADIAL ARTERY, AND  SAPHENOUS VEIN to CIRCUMFLEX, RIGHT GREATER SAPHENOUS VEIN HARVESTED ENDOSCOPICALLY;  Surgeon: Army Dallas NOVAK, MD;  Location: [facility] OR;  Service: Open Heart Surgery;  Laterality: N/A;   CORONARY/GRAFT ACUTE MI REVASCULARIZATION N/A 07/24/2017   Procedure: Coronary/Graft Acute MI Revascularization;  Surgeon: Claudene Victory ORN, MD;  Location: [facility] IN[facility] CV LAB;  Service: Cardiovascular;  Laterality: N/A;   LAPAROSCOPIC GASTRIC BANDING  09/02/2008   LEFT HEART CATH AND CORONARY ANGIOGRAPHY N/A 07/24/2017   Procedure: LEFT HEART CATH AND CORONARY ANGIOGRAPHY;  Surgeon: Claudene Victory ORN, MD;  Location: [facility] INVAS[facility] CV LAB;  Service: Cardiovascular;  Laterality: N/A;   RADIAL ARTERY HARVEST Left 08/02/2017   Procedure: LEFT RADIAL ARTERY HARVEST;  Surgeon: Army Dallas NOVAK, MD;  Location: [facility] OR;  Service: Open Heart Surgery;  Laterality: Left;   STERNAL CLOSURE N/A 08/02/2017   Procedure: STERNAL PLATING;  Surgeon: Army Dallas NOVAK, MD;  Location: [facility] OR;  Service: Open Heart Surgery;  Laterality: N/A;   STOMACH SURGERY  1982  had metal removed from stomach as a child   TEE WITHOUT CARDIOVERSION N/A 08/02/2017   Procedure: TRANSESOPHAGEAL ECHOCARDIOGRAM (TEE);  Surgeon: Army Dallas NOVAK, MD;  Location: [facility] OR;  Service: Open Heart Surgery;  Laterality: N/A;      OB History     Gravida  2   Para  1   Term  1   Preterm  0   AB  1   Living  1      SAB  1   IAB  0   Ectopic  0   Multiple  0   Live Births  1               Current Medications: Current Meds  Medication Sig   Accu-Chek FastClix Lancets MISC as directed subcutaneous Twice a day; Duration: 30 days   acetaminophen  (TYLENOL ) 500 MG tablet Take 500 mg by mouth  every 6 (six) hours as needed for mild pain (pain score 1-3) or headache.   amLODipine  (NORVASC ) 10 MG tablet Take 0.5 tablets (5 mg total) by mouth daily.   aspirin  EC 81 MG tablet Take 2 tablets (162 mg total) by mouth daily.   BD PEN NEEDLE NANO 2ND GEN 32G X 4 MM MISC See admin instructions.   cholecalciferol (VITAMIN D3) 25 MCG (1000 UNIT) tablet Take 1,000 Units by mouth daily.   Continuous Glucose Sensor (DEXCOM G7 SENSOR) MISC daily at 6 (six) AM.   enalapril  (VASOTEC ) 5 MG tablet Take 1 tablet (5 mg total) by mouth daily.   ferrous sulfate  325 (65 FE) MG tablet Take 1 tablet (325 mg total) by mouth every other day.   furosemide  (LASIX ) 20 MG tablet Take 1 tablet (20 mg total) by mouth daily.   LANTUS  SOLOSTAR 100 UNIT/ML Solostar Pen Inject 10 Units into the skin daily.   metoprolol  succinate (TOPROL -XL) 25 MG 24 hr tablet Take 1 tablet (25 mg total) by mouth daily.   nystatin  cream (MYCOSTATIN ) Apply thin layer to nipples after each breastfeeding or pumping session. Continue to use for 2 weeks after all symptoms are gone.   polycarbophil (FIBERCON) 625 MG tablet Take 625 mg by mouth daily.   potassium chloride  (KLOR-CON ) 20 MEQ packet Mix one packet with four to eight ounces of water and drink once daily   Probiotic Product (PROBIOTIC DAILY PO) Take by mouth.   vitamin B-12 (CYANOCOBALAMIN ) 100 MCG tablet Take 100 mcg by mouth daily.   witch hazel-glycerin  (TUCKS) pad Apply 1 Application topically as needed for hemorrhoids.     Allergies:   Patient has no known allergies.   Social History   Socioeconomic History   Marital status: Married    Spouse name: [name]   Number of children: Not on file   Years of education: Not on file   Highest education level: Not on file  Occupational History   Occupation: Retail banker: [employer]  Tobacco Use   Smoking status: Never   Smokeless tobacco: Never  Vaping Use   Vaping status: Never Used   Substance and Sexual Activity   Alcohol use: Not Currently    Comment: occ   Drug use: No   Sexual activity: Not Currently  Other Topics Concern   Not on file  Social History Narrative   Not on file   Social Drivers of Health   Financial Resource Strain: Not on file  Food Insecurity: No Food Insecurity (02/19/2024)   Hunger Vital Sign  Worried About Programme researcher, broadcasting/film/video in the Last Year: Never true    Ran Out of Food in the Last Year: Never true  Transportation Needs: No Transportation Needs (02/19/2024)   PRAPARE - Administrator, Civil Service (Medical): No    Lack of Transportation (Non-Medical): No  Physical Activity: Not on file  Stress: Not on file  Social Connections: Socially Isolated (02/19/2024)   Social Connection and Isolation Panel    Frequency of Communication with Friends and Family: Never    Frequency of Social Gatherings with Friends and Family: Never    Attends Religious Services: Never    Database administrator or Organizations: No    Attends Engineer, structural: Never    Marital Status: Married      Family History  Problem Relation Age of Onset   Hyperlipidemia Mother    Hypertension Mother    Obesity Mother    Diabetes Father    Hypertension Father    Heart disease Father    Sudden death Father 35       died of MI   Cancer Maternal Grandmother        lung   Cancer Maternal Grandfather        prostate      ROS:   Please see the history of present illness.     All other systems reviewed and are negative.   Labs/EKG Reviewed:    EKG:   EKG was not ordered today.    Recent Labs: 12/22/2023: TSH 1.320; TSH CANCELED 02/21/2024: ALT 12; BUN <5; Creatinine, Ser 0.64; Hemoglobin 11.7; Platelets 258; Potassium 3.4; Sodium 136   Recent Lipid Panel Lab Results  Component Value Date/Time   CHOL 141 05/20/2023 10:03 AM   TRIG 62 05/20/2023 10:03 AM   HDL 49 05/20/2023 10:03 AM   CHOLHDL 2.9 05/20/2023 10:03 AM   CHOLHDL 3.6  07/26/2017 02:50 AM   LDLCALC 79 05/20/2023 10:03 AM    Physical Exam:    VS:  BP (!) 146/84 (BP Location: Right Arm, Patient Position: Sitting, Cuff Size: Large)   Pulse 60   Ht 5' 4 (1.626 m)   Wt 279 lb 6.4 oz (126.7 kg)   SpO2 97%   BMI 47.96 kg/m     Wt Readings from Last 3 Encounters:  05/29/24 279 lb 6.4 oz (126.7 kg)  04/03/24 272 lb (123.4 kg)  03/02/24 260 lb (117.9 kg)     GEN:  Well nourished, well developed in no acute distress HEENT: Normal NECK: No JVD; No carotid bruits LYMPHATICS: No lymphadenopathy CARDIAC: RRR, no murmurs, rubs, gallops RESPIRATORY:  Clear to auscultation without rales, wheezing or rhonchi  ABDOMEN: Soft, non-tender, non-distended MUSCULOSKELETAL:  No edema; No deformity  SKIN: Warm and dry NEUROLOGIC:  Alert and oriented x 3 PSYCHIATRIC:  Normal affect    Risk Assessment/Risk Calculators:                 ASSESSMENT & PLAN:    CAD-thankfully No chest pain suspected for anginal. Will continue to monitor her closely. Recent echo normal with no evidence of wall motion abnormalities. She is no longer breastfeeding feeding so I will restart her lipitor .     Hypertensive heart disease Blood pressure elevated at 142/80 mmHg.  - Stop enalapril  and start olmesartan  20 mg daily.  Type 2 diabetes mellitus . Switching to olmesartan  due to non-breastfeeding status. - Restart olmesartan  20 mg daily for kidney protection. - Monitor blood pressure  and adjust medications as needed.  Hyperlipidemia Requires resumption of lipid management therapy. - Restart Lipitor  20 mg daily. - Stop Lipitor  if a positive pregnancy test occurs.  The patient is in agreement with the above plan. The patient left the office in stable condition.  The patient will follow up in  There are no Patient Instructions on file for this visit.   Dispo:  No follow-ups on file.   Medication Adjustments/Labs and Tests Ordered: Current medicines are reviewed at  length with the patient today.  Concerns regarding medicines are outlined above.  Tests Ordered: No orders of the defined types were placed in this encounter.  Medication Changes: No orders of the defined types were placed in this encounter.

## 2024-06-05 ENCOUNTER — Telehealth: Payer: Self-pay

## 2024-06-05 NOTE — Telephone Encounter (Signed)
 Called to complete monthly [employer] Mom call. No answer. Left VM to return call.

## 2024-06-15 ENCOUNTER — Encounter: Payer: Self-pay | Admitting: Cardiology

## 2024-07-17 ENCOUNTER — Telehealth: Payer: Self-pay

## 2024-07-17 NOTE — Telephone Encounter (Signed)
 Called to complete monthly [employer] Mom call. No answer. Left VM to return call.

## 2024-08-15 ENCOUNTER — Telehealth: Payer: Self-pay

## 2024-08-15 NOTE — Telephone Encounter (Signed)
 Called to complete monthly [employer] Mom call. No answer. Left VM to return call.

## 2024-09-06 ENCOUNTER — Other Ambulatory Visit: Payer: Self-pay | Admitting: Cardiology

## 2024-09-06 ENCOUNTER — Telehealth: Payer: Self-pay

## 2024-09-06 DIAGNOSIS — O10919 Unspecified pre-existing hypertension complicating pregnancy, unspecified trimester: Secondary | ICD-10-CM

## 2024-09-06 NOTE — Telephone Encounter (Signed)
 Called to complete monthly [employer] Mom call. No answer. Left VM to return call.   Arlo Butt BS, IBCLC

## 2024-10-19 ENCOUNTER — Telehealth: Payer: Self-pay

## 2024-10-19 NOTE — Telephone Encounter (Signed)
 Called to complete monthly [employer] Mom call. No answer. Left VM to return call.   Arlo Butt BS, IBCLC
